# Patient Record
Sex: Male | Born: 1970 | Race: Black or African American | Hispanic: No | Marital: Single | Smoking: Current every day smoker
Health system: Southern US, Community
[De-identification: ages and names within clinical notes are randomized; demographics above are authoritative.]

## PROBLEM LIST (undated history)

## (undated) DIAGNOSIS — M1990 Unspecified osteoarthritis, unspecified site: Secondary | ICD-10-CM

## (undated) DIAGNOSIS — I10 Essential (primary) hypertension: Secondary | ICD-10-CM

## (undated) HISTORY — PX: ABDOMINAL SURGERY: SHX537

---

## 2006-12-16 ENCOUNTER — Emergency Department: Payer: Self-pay | Admitting: Emergency Medicine

## 2007-02-17 ENCOUNTER — Emergency Department: Payer: Self-pay | Admitting: Emergency Medicine

## 2007-08-27 ENCOUNTER — Emergency Department: Payer: Self-pay | Admitting: Emergency Medicine

## 2010-03-28 ENCOUNTER — Emergency Department: Payer: Self-pay | Admitting: Emergency Medicine

## 2010-05-14 ENCOUNTER — Emergency Department: Payer: Self-pay | Admitting: Emergency Medicine

## 2012-10-28 LAB — COMPREHENSIVE METABOLIC PANEL
Albumin: 3.1 g/dL — ABNORMAL LOW (ref 3.4–5.0)
Alkaline Phosphatase: 80 U/L (ref 50–136)
Anion Gap: 5 — ABNORMAL LOW (ref 7–16)
Calcium, Total: 8.5 mg/dL (ref 8.5–10.1)
Co2: 29 mmol/L (ref 21–32)
EGFR (Non-African Amer.): >60
Osmolality: 281 (ref 275–301)
SGOT(AST): 22 U/L (ref 15–37)
SGPT (ALT): 37 U/L (ref 12–78)
Sodium: 140 mmol/L (ref 136–145)
Total Protein: 7.9 g/dL (ref 6.4–8.2)

## 2012-10-28 LAB — LIPASE, BLOOD: Lipase: 142 U/L (ref 73–393)

## 2012-10-28 LAB — CBC WITH DIFFERENTIAL/PLATELET
Basophil #: 0.2 10*3/uL — ABNORMAL HIGH (ref 0.0–0.1)
Basophil %: 1.2 %
Eosinophil %: 1.2 %
MCH: 29.9 pg (ref 26.0–34.0)
MCHC: 33 g/dL (ref 32.0–36.0)
MCV: 91 fL (ref 80–100)
Neutrophil %: 74 %
RDW: 15.3 % — ABNORMAL HIGH (ref 11.5–14.5)
WBC: 15 10*3/uL — ABNORMAL HIGH (ref 3.8–10.6)

## 2012-10-29 ENCOUNTER — Inpatient Hospital Stay: Payer: Self-pay | Admitting: Specialist

## 2012-10-29 LAB — BASIC METABOLIC PANEL
Anion Gap: 4 — ABNORMAL LOW (ref 7–16)
BUN: 11 mg/dL (ref 7–18)
Calcium, Total: 8.5 mg/dL (ref 8.5–10.1)
Chloride: 105 mmol/L (ref 98–107)
Co2: 28 mmol/L (ref 21–32)
EGFR (African American): >60
Glucose: 91 mg/dL (ref 65–99)
Potassium: 4.1 mmol/L (ref 3.5–5.1)
Sodium: 137 mmol/L (ref 136–145)

## 2012-10-29 LAB — CBC WITH DIFFERENTIAL/PLATELET
Basophil #: 0.1 10*3/uL (ref 0.0–0.1)
Basophil %: 0.8 %
Eosinophil #: 0.2 10*3/uL (ref 0.0–0.7)
Eosinophil %: 1.5 %
HCT: 41 % (ref 40.0–52.0)
HGB: 13.8 g/dL (ref 13.0–18.0)
Lymphocyte #: 1.7 10*3/uL (ref 1.0–3.6)
Lymphocyte %: 12.3 %
MCH: 30.8 pg (ref 26.0–34.0)
MCHC: 33.7 g/dL (ref 32.0–36.0)
Neutrophil #: 9.9 10*3/uL — ABNORMAL HIGH (ref 1.4–6.5)
Platelet: 288 10*3/uL (ref 150–440)
RBC: 4.48 10*6/uL (ref 4.40–5.90)
RDW: 15 % — ABNORMAL HIGH (ref 11.5–14.5)

## 2012-10-30 LAB — VANCOMYCIN, TROUGH: Vancomycin, Trough: 14 ug/mL (ref 10–20)

## 2012-11-03 LAB — CULTURE, BLOOD (SINGLE)

## 2012-11-05 LAB — WOUND CULTURE

## 2014-12-09 NOTE — Consult Note (Signed)
Brief Consult Note: Diagnosis: rt abd wall abscess.   Patient was seen by consultant.   Consult note dictated.   Recommend to proceed with surgery or procedure.   Orders entered.   Comments: spont drainage butr still very indurated and tender rec I&D in OR risks and options in detail pt not made NPO, will  schedule in OR in am.  Electronic Signatures: Lattie Hawooper, Keagan Anthis E (MD)  (Signed 12-Mar-14 20:52)  Authored: Brief Consult Note   Last Updated: 12-Mar-14 20:52 by Lattie Hawooper, Labria Wos E (MD)

## 2014-12-09 NOTE — Op Note (Signed)
PATIENT NAME:  Mark Mcintosh, Mark Mcintosh MR#:  [id number] DATE OF BIRTH:  July 19, 1971  DATE OF PROCEDURE:  10/29/2012  PREOPERATIVE DIAGNOSIS: Right lower quadrant abdominal wall abscess.   PROCEDURE PERFORMED: Incision and drainage of abdominal wall abscess.   SURGEON: Natale LayMark Bird, M.D.   ASSISTANT: None.   ANESTHESIA: Local with general.   FINDINGS: Pus.   SPECIMENS: Pus.   DRAINS: Penrose x 2.   ESTIMATED BLOOD LOSS: Minimal.   DESCRIPTION OF PROCEDURE: With the patient in the supine position, general oral endotracheal anesthesia was induced. His abdomen was widely prepped and draped with ChloraPrep solution. Timeout was observed.   There was a punctum for purulent drainage from the inferior and medial aspect of approximately 15 x 15 cm area of induration in the right lower quadrant abdominal wall pannus. This punctum was used as a starting point for drainage of the abscess. A cruciate incision was fashioned overlying the site. A small amount of purulent drainage was encountered. More superiorly and laterally on the abdominal wall a second cruciate incision was fashioned with extrusion of a large amount of foul-smelling pus, aliquot of which first sent for micro bacteriological analysis.   Loculations were disrupted of a large abdominal wall abscess cavity with finger fracture technique. The abscess was then connected to the lowermost and medial incision site.  It then tracked also inferiorly and laterally. A third counter incision was fashioned in the usual cruciate manner. The wound was then irrigated and debrided several times with gauze dissection. Penrose drains were then inserted and secured at the skin site with nylon suture. Sterile occlusive dressing was then placed and the patient was then subsequently extubated and taken to the recovery room in stable and satisfactory condition by anesthesia services.     ____________________________ Redge GainerMark A. Egbert GaribaldiBird, MD mab:ct D: 10/30/2012 12:19:57  ET T: 10/31/2012 07:33:27 ET JOB#: [id number]  cc: Loraine LericheMark A. Egbert GaribaldiBird, MD, <Dictator> Raynald KempMARK A BIRD MD ELECTRONICALLY SIGNED 11/03/2012 19:35

## 2014-12-09 NOTE — Discharge Summary (Signed)
PATIENT NAME:  Mark Mcintosh, Mark Mcintosh MR#:  [id number] DATE OF BIRTH:  09/24/1970  For a detailed note, please see the history and physical done on admission by Dr. Mordecai MaesSanchez.   DIAGNOSES AT DISCHARGE: As follows: Abdominal wall cellulitis/abscess, status post incision and drainage, a history of tobacco abuse, stage I hypertension.   DISCHARGE DIET: The patient is being discharged on a regular diet.   ACTIVITY: As tolerated.   FOLLOWUP: With Dr. Natale LayMark Bird in the next 1 to 2 weeks.   DISCHARGE MEDICATIONS: Tylenol with hydrocodone 5/325, 1 tablet q.4 h. as needed, Bactrim double-strength 1 tablet b.i.d. x 10 days.   PERTINENT STUDIES DONE DURING THE HOSPITAL COURSE: A CT scan of the abdomen and pelvis done with contrast on admission showing multiple lobular fluid collections in the subcutaneous fat along the anterior right hemipelvis, concerning for multiple small abscesses.   HOSPITAL COURSE: This is a 44 year old male with no significant past medical history who presented to the hospital with abdominal wall redness and tenderness, and drainage. I diagnosed with a suspected cellulitis with abscess formation, given the CT scan findings.   1.  Abdominal wall cellulitis/abscess: This was noted on the CT scan done on admission. The patient initially was started on broad-spectrum IV antibiotics, including vancomycin and Zosyn. The patient was seen by Surgery. Underwent a surgical incision and drainage. The wound cultures from the surgery are growing gram-positive cocci in pairs and chains and a few gram- negative rods. The sensitivities and identification are still pending, although patient with IV vancomycin has clinically significantly improved. He is being discharged on p.o. Bactrim with close followup with Surgery as an outpatient. The patient does have a Penrose drain presently in his mid-abdomen, but this is to be removed by Surgery as an outpatient.  2.  Tobacco abuse: The patient was maintained on a  nicotine patch and he was strongly advised to quit smoking.  3.  Hypertension: The patient had some labile blood pressures as high as systolics in the 150s, diastolics in the 90s. He does not have a previous history of hypertension. I did not put him on any medications presently. I would prefer that he get himself a primary care physician and get this re-evaluated as an outpatient.   The patient is a FULL CODE.   Patient time spent is 35 minutes.     ____________________________ Rolly PancakeVivek J. Cherlynn KaiserSainani, MD vjs:dm D: 10/31/2012 14:13:00 ET T: 11/01/2012 10:03:12 ET JOB#: [id number]  cc: Loraine LericheMark A. Egbert GaribaldiBird, MD Rolly PancakeVivek J. Cherlynn KaiserSainani, MD, <Dictator>  Houston SirenVIVEK J SAINANI MD ELECTRONICALLY SIGNED 11/02/2012 8:18

## 2014-12-09 NOTE — Consult Note (Signed)
PATIENT NAME:  Mark Mcintosh, Mark Mcintosh MR#:  [id number] DATE OF BIRTH:  10/07/1970  DATE OF CONSULTATION:  10/28/2012  REFERRING PHYSICIAN:   CONSULTING PHYSICIAN:  Adah Salvageichard E. Excell Seltzerooper, MD  CHIEF COMPLAINT:  Right abdominal wall abscess.   HISTORY OF PRESENT ILLNESS:  This is a 44 year old black male patient who was in his previous healthy state until Saturday at which time he noted pain and swelling in his right abdominal wall. He has noticed some spontaneous drainage and his significant other may have pushed on this and drained some material as well, but no further drainage. He has also had some low-grade fevers. He has never had an episode like this before.   Surgery service was asked to see this patient for possible incision and drainage. He has not been made n.p.o. and ate dinner tonight.   PAST MEDICAL HISTORY:  None.   PAST SURGICAL HISTORY:  None.   ALLERGIES:  None.   MEDICATIONS:  None.   FAMILY HISTORY:  Noncontributory.   SOCIAL HISTORY:  The patient is employed. He works with chemicals. He smokes tobacco products.   REVIEW OF SYSTEMS:  A 10 system review is performed and negative with the exception of that mentioned in the HPI.   PHYSICAL EXAMINATION: GENERAL: Morbidly obese black male patient, BMI of 41, 72 inches tall, 301 pounds.  HEENT: No scleral icterus.  NECK: No palpable neck nodes.  CHEST: Clear to auscultation.  CARDIAC: Regular rate and rhythm.  ABDOMEN: Soft. There is an erythematous area outlined by a marking pen in the right lower quadrant at the groin crease with no expressible drainage. There is no fluctuance, but he is markedly tender suggesting abscess.  EXTREMITIES: Without edema. Calves are nontender.  NEUROLOGIC: Grossly intact.  INTEGUMENT: No jaundice.   DIAGNOSTIC DATA:  White blood cell count is 15 and H and H of 14 and 44. Electrolytes are within normal limits. Serum potassium is 3.4 and albumin is 3.1 MRSA screen is negative. CT scan has been  personally reviewed. Subcutaneous tissue filled with fluid collections in the right lower quadrant.   ASSESSMENT AND PLAN:  This is a patient with cellulitis and probable abscess in the right lower quadrant. I have recommended incision and drainage in the operating room. Unfortunately, the patient was not made n.p.o. I discussed with him and his significant other the rationale for offering surgery, the risks of bleeding, infection, recurrence, open wound, drain placement, packing and recurrent abscess. This was all reviewed for him. Because of his n.p.o. status, he will be made n.p.o. after midnight and scheduled for tomorrow continuing on intravenous antibiotics. Fluid replacement will be instituted as well.   ____________________________ Adah Salvageichard E. Excell Seltzerooper, MD rec:si D: 10/28/2012 21:10:32 ET T: 10/29/2012 00:06:08 ET JOB#: [id number]  cc: Adah Salvageichard E. Excell Seltzerooper, MD, <Dictator> Lattie HawICHARD E COOPER MD ELECTRONICALLY SIGNED 10/29/2012 3:16

## 2014-12-09 NOTE — H&P (Signed)
PATIENT NAME:  Mark Mcintosh, Mark Mcintosh MR#:  [id number] DATE OF BIRTH:  September 15, 1970  DATE OF ADMISSION:  10/28/2012  REFERRING PHYSICIAN: Sheryl L. Mindi Junker, MD  PRIMARY CARE PHYSICIAN: None.   CHIEF COMPLAINT: Abdominal wall pain with an abscess/boil.  HISTORY OF PRESENT ILLNESS: The patient is a 44 year old gentleman with history of being overall healthy, comes to the ER with an abscess on at the groin that started Saturday, more like a boil, starting to drain a lot of pus and leaking. Sunday, he started having effusion of that abscess into the lower part of the abdominal wall and the patient started to have a significant amount of pain. He has a history of boils in the past, but he has never been diagnosed with MRSA. He denies any chronic medical conditions, but he does not see a doctor regularly.  The patient states that the pain has been very severe. He has gotten at least 3 doses of morphine here, and his pain has improved but still not significantly to be able to feel that he can go home. I was asked to admit the patient due to systemic findings of the pain including increase of white blood cells, fever, and the patient being very tachycardic with a pulse of 120. He looks dehydrated and he is getting IV fluids as well. His temperature here at the ER was 98.1, but the patient states he has been having fevers at home.   REVIEW OF SYSTEMS:    CONSTITUTIONAL: Positive fever. Positive abdominal pain. No weight gain, weight loss recently.  EYES: No blurry vision, double vision or changes in eyesight.  ENT: No hearing loss. No epistaxis. No sinus pain.  RESPIRATORY: No shortness of breath, cough, wheezing or hemoptysis.  CARDIOVASCULAR: No chest pain, orthopnea, edema or palpitations.  GASTROINTESTINAL: No nausea, vomiting or abdominal pain, other than the abdominal wall pain. No significant GERD or jaundice.  GENITOURINARY: No dysuria, hematuria or changes of frequency or incontinence. No prostatitis.   ENDOCRINE: No polyuria, polydipsia or polyphagia. No cold or heat intolerance. The patient is thirsty right now because he has not been drinking much and he looks dehydrated anyway.  HEMATOLOGIC AND LYMPHATIC: No anemia, easy bruising, bleeding or swollen glands.  SKIN: No rashes or lesions.  MUSCULOSKELETAL: No significant pain in the back, shoulder or knee. No gout.  NEUROLOGIC: No numbness or tingling. No CVAs or TIAs.  PSYCHIATRIC: No significant insomnia, depression or other problems.   PAST MEDICAL HISTORY: Obesity. The patient overall states he is healthy. Positive tobacco abuse.   ALLERGIES: No known drug allergies.   SURGICAL HISTORY: Positive for tonsillectomy.   SOCIAL HISTORY: The patient smokes 1/2 pack a day. He has been smoking since his teenage years, probably over 20 years. He states that he would like to quit. I gave him counseling for smoking cessation for about 5 minutes and the patient states that he just does not have the economic power to buy expensive drugs and other things to help him quit. I gave him other solutions as far as physical activity and other easy fixes for quitting smoking. He states that he drinks probably a couple of drinks over the weekend. He does not use drugs. He lives with his girlfriend and he works as a Financial controller in a company.   MEDICATIONS: No medications.   FAMILY HISTORY: Positive for coronary artery disease in grandmother. Negative for cancer. Positive for hypertension in mother and grandmother.   PHYSICAL EXAMINATION:  VITAL SIGNS: Blood pressure  of 156/85 at admission, right now 132/79, pulse of 120, right now 93, temperature 98.1, oxygen saturation 99% on room air. GENERAL: Alert, oriented x 3. No acute distress. No respiratory distress. Hemodynamically stable.  HEENT: Pupils are equal and reactive. Extraocular movements are intact. Mucosae are very dry. Anicteric sclerae. Pink conjunctivae. No oral lesions.  NECK:  Supple. No JVD. No thyromegaly. No adenopathy. No carotid bruits.  CARDIOVASCULAR: Regular rate and rhythm. No murmurs, rubs or gallops are appreciated. No displacement of PMI.  LUNGS: Clear without any wheezing or crepitus. No use of accessory muscles.  ABDOMEN: Soft, nontender, nondistended in general, but he has an abdominal wall cellulitic process with induration at the level of the right lower quadrant measuring about 7 to 8 cm surrounding. He has also a growing boil that is starting to get dry. There is no fluctuation, but it feels really indurated too. There is no secretion of pus at this moment. No inguinal lymphadenopathy, and there is no rebound tenderness.  GENITAL: Negative for external lesions.  EXTREMITIES: No edema, no cyanosis, no clubbing. Pulses +2.  LYMPHATICS: Negative for lymphadenopathy in the neck, supraclavicular area or groin.  MUSCULOSKELETAL: No significant joint effusions or joint erythema.  SKIN: Without any rashes or petechiae, other than the one described on the abdomen.  PSYCHIATRIC: Mood is normal without any signs of depression or anxiety.  NEUROLOGIC: No numbness, tingling. Cranial nerves II through XII intact.   LABORATORY, DIAGNOSTIC AND RADIOLOGIC DATA: White count is 15,000. Total protein is 7.9, albumin 2.1. Glucose is 130. Potassium is 3.4.   ASSESSMENT AND PLAN: A 44 year old gentleman admitted with a boil and abdominal wall abscess: The patient is overall healthy other than obesity and tobacco abuse, and we are admitting him due to intractable pain and cellulitic process with systemic manifestations.  1.  The process does not quite look like an abcess mostly a boil with cellulitic process, for what he is going to get intravenous antibiotics. He has been started on vancomycin, for what I am going to continue that. He has a history of boils in the past. He has never been told he has methicillin-resistant Staphylococcus aureus. We are going to do  methicillin-resistant Staphylococcus aureus screening with a swab. If the patient has any secretions, we are going to culture it as well. I am going to consult surgery to see if the patient needs to be incised and drained. At this moment, I think that he is close to that. Maybe the antibiotics will help, but I want to have surgery on the case since the patient might be discharged since he is on observation status.  2.  Hypertension: The patient came with elevation of blood pressure. With his obesity and tobacco abuse, it is very likely that the patient may have some hypertension, but that could be also related with pain. We are going to observe.  3.  The other issue is hyperglycemia, and we are going to observe and get a hemoglobin A1c. 4.  The patient is a full code.   TIME SPENT: I spent about 45 minutes with admission. I spent 5 minutes giving tobacco counseling.    ____________________________ Felipa Furnaceoberto Sanchez Gutierrez, MD rsg:jm D: 10/28/2012 15:12:26 ET T: 10/28/2012 15:40:43 ET JOB#: [id number]  cc: Felipa Furnaceoberto Sanchez Gutierrez, MD, <Dictator> ROBERTO Juanda ChanceSANCHEZ GUTIERRE MD ELECTRONICALLY SIGNED 11/12/2012 21:42

## 2015-03-21 ENCOUNTER — Emergency Department: Payer: BLUE CROSS/BLUE SHIELD

## 2015-03-21 ENCOUNTER — Encounter: Payer: Self-pay | Admitting: Emergency Medicine

## 2015-03-21 ENCOUNTER — Emergency Department
Admission: EM | Admit: 2015-03-21 | Discharge: 2015-03-21 | Disposition: A | Discharge status: Home / Self Care | Payer: BLUE CROSS/BLUE SHIELD | Attending: Emergency Medicine | Admitting: Emergency Medicine

## 2015-03-21 DIAGNOSIS — X58XXXA Exposure to other specified factors, initial encounter: Secondary | ICD-10-CM | POA: Diagnosis not present

## 2015-03-21 DIAGNOSIS — Z720 Tobacco use: Secondary | ICD-10-CM | POA: Diagnosis not present

## 2015-03-21 DIAGNOSIS — Y998 Other external cause status: Secondary | ICD-10-CM | POA: Diagnosis not present

## 2015-03-21 DIAGNOSIS — Y92009 Unspecified place in unspecified non-institutional (private) residence as the place of occurrence of the external cause: Secondary | ICD-10-CM | POA: Insufficient documentation

## 2015-03-21 DIAGNOSIS — Y9389 Activity, other specified: Secondary | ICD-10-CM | POA: Diagnosis not present

## 2015-03-21 DIAGNOSIS — R05 Cough: Secondary | ICD-10-CM | POA: Insufficient documentation

## 2015-03-21 DIAGNOSIS — S299XXA Unspecified injury of thorax, initial encounter: Secondary | ICD-10-CM | POA: Insufficient documentation

## 2015-03-21 DIAGNOSIS — R0789 Other chest pain: Secondary | ICD-10-CM

## 2015-03-21 MED ORDER — OXYCODONE-ACETAMINOPHEN 5-325 MG PO TABS
ORAL_TABLET | ORAL | Status: AC
  Administered 2015-03-21: 1 via ORAL
  Filled 2015-03-21: qty 1

## 2015-03-21 MED ORDER — NAPROXEN 250 MG PO TABS: 250.0000 mg | ORAL_TABLET | Freq: Two times a day (BID) | ORAL | Status: DC

## 2015-03-21 MED ORDER — OXYCODONE-ACETAMINOPHEN 5-325 MG PO TABS: 1.0000 | ORAL_TABLET | Freq: Four times a day (QID) | ORAL | Status: DC | PRN

## 2015-03-21 MED ORDER — OXYCODONE-ACETAMINOPHEN 5-325 MG PO TABS
1.0000 | ORAL_TABLET | Freq: Once | ORAL | Status: AC
  Administered 2015-03-21: 1 via ORAL

## 2015-03-21 MED ORDER — AZITHROMYCIN 250 MG PO TABS: ORAL_TABLET | ORAL | Status: DC

## 2015-03-21 NOTE — ED Notes (Signed)
Patient ambulatory to triage with steady gait, without difficulty or distress noted; pt reports Saturday while at friend's house began coughing hard and felt pop in left lower ribcage

## 2015-03-21 NOTE — ED Notes (Signed)
Teaching performed on incentive spirometer with teach back

## 2015-03-21 NOTE — ED Notes (Signed)
Patient transported to X-ray 

## 2015-03-21 NOTE — ED Provider Notes (Signed)
[facility] Emergency Department Provider Note  ____________________________________________  Time seen: 7:10 AM  I have reviewed the triage vital signs and the nursing notes.   HISTORY  Chief Complaint Rib Injury    HPI Mark Mcintosh is a 44 y.o. male who complains of sudden onset of sharp pain in the left lower chest laterally after forcefully coughing 3 days ago. Before that he was feeling totally fine and did not have any chest pain or shortness of breath. Afterward it is painful to breathe. He also notes that he's having some productive cough that is at times blood-tinged. No history of DVT or PE. No medical history and takes no medicines.No recent travel trauma hospitalizations or surgeries. He also complains of some night sweats.     History reviewed. No pertinent past medical history.  There are no active problems to display for this patient.   Past Surgical History  Procedure Laterality Date  . Abdominal surgery      Current Outpatient Rx  Name  Route  Sig  Dispense  Refill  . azithromycin (ZITHROMAX Z-PAK) 250 MG tablet      Take 2 tablets (500 mg) on  Day 1,  followed by 1 tablet (250 mg) once daily on Days 2 through 5.   6 each   0   . naproxen (NAPROSYN) 250 MG tablet   Oral   Take 1 tablet (250 mg total) by mouth 2 (two) times daily with a meal.   40 tablet   0   . oxyCODONE-acetaminophen (ROXICET) 5-325 MG per tablet   Oral   Take 1 tablet by mouth every 6 (six) hours as needed for severe pain.   12 tablet   0     Allergies Review of patient's allergies indicates no known allergies.  No family history on file.  Social History History  Substance Use Topics  . Smoking status: Current Every Day Smoker -- 1.00 packs/day    Types: Cigarettes  . Smokeless tobacco: Not on file  . Alcohol Use: Yes    Review of Systems  Constitutional: No fever or chills. No weight changes Eyes:No blurry vision or double vision.   ENT: No sore throat. Cardiovascular: chest pain with coughing. Respiratory: Positive cough productive of mucus. Gastrointestinal: Negative for abdominal pain, vomiting and diarrhea.  No BRBPR or melena. Genitourinary: Negative for dysuria, urinary retention, bloody urine, or difficulty urinating. Musculoskeletal: Negative for back pain. No joint swelling or pain. Skin: Negative for rash. Neurological: Negative for headaches, focal weakness or numbness. Psychiatric:No anxiety or depression.   Endocrine:No hot/cold intolerance, changes in energy, or sleep difficulty.  10-point ROS otherwise negative.  ____________________________________________   PHYSICAL EXAM:  VITAL SIGNS: ED Triage Vitals  Enc Vitals Group     BP 03/21/15 0628 173/130 mmHg     Pulse Rate 03/21/15 0628 90     Resp 03/21/15 0628 20     Temp 03/21/15 0628 98 F (36.7 C)     Temp Source 03/21/15 0628 Oral     SpO2 03/21/15 0628 95 %     Weight 03/21/15 0628 260 lb (117.935 kg)     Height 03/21/15 0628 5\' 11"  (1.803 m)     Head Cir --      Peak Flow --      Pain Score 03/21/15 0628 7     Pain Loc --      Pain Edu? --      Excl. in GC? --  Constitutional: Alert and oriented. Well appearing and in no distress. Eyes: No scleral icterus. No conjunctival pallor. PERRL. EOMI ENT   Head: Normocephalic and atraumatic.   Nose: No congestion/rhinnorhea. No septal hematoma   Mouth/Throat: MMM, no pharyngeal erythema. No peritonsillar mass. No uvula shift.   Neck: No stridor. No SubQ emphysema. No meningismus. Hematological/Lymphatic/Immunilogical: No cervical lymphadenopathy. Cardiovascular: RRR. Normal and symmetric distal pulses are present in all extremities. No murmurs, rubs, or gallops. Respiratory: Normal respiratory effort without tachypnea nor retractions. Breath sounds are clear and equal bilaterally. No wheezes/rales/rhonchi. Chest wall markedly tender over the sternum and over the left  inferior ribs particularly laterally. There is no instability or crepitus. Gastrointestinal: Soft and nontender. No distention. There is no CVA tenderness.  No rebound, rigidity, or guarding. Genitourinary: deferred Musculoskeletal: Nontender with normal range of motion in all extremities. No joint effusions.  No lower extremity tenderness.  No edema. Neurologic:   Normal speech and language.  CN 2-10 normal. Motor grossly intact. No pronator drift.  Normal gait. No gross focal neurologic deficits are appreciated.  Skin:  Skin is warm, dry and intact. No rash noted.  No petechiae, purpura, or bullae. Psychiatric: Mood and affect are normal. Speech and behavior are normal. Patient exhibits appropriate insight and judgment.  ____________________________________________    LABS (pertinent positives/negatives) (all labs ordered are listed, but only abnormal results are displayed) Labs Reviewed - No data to display ____________________________________________   EKG    ____________________________________________    RADIOLOGY  X-ray Left rib series unremarkable  ____________________________________________   PROCEDURES  ____________________________________________   INITIAL IMPRESSION / ASSESSMENT AND PLAN / ED COURSE  Pertinent labs & imaging results that were available during my care of the patient were reviewed by me and considered in my medical decision making (see chart for details).  Patient presents with acute chest wall pain. Very low suspicion for PE ACS or aortic dissection or pericarditis. Blood-tinged sputum very likely to be bronchitic in nature and will start him on NSAIDs and Percocet as needed and incentive spirometer.  ____________________________________________   FINAL CLINICAL IMPRESSION(S) / ED DIAGNOSES  Final diagnoses:  Acute chest wall pain      Sharman Cheek, MD 03/21/15 [phone]

## 2015-03-21 NOTE — Discharge Instructions (Signed)
You were prescribed a medication that is potentially sedating. Do not drink alcohol, drive or participate in any other potentially dangerous activities while taking this medication as it may make you sleepy. Do not take this medication with any other sedating medications, either prescription or over-the-counter. If you were prescribed Percocet or Vicodin, do not take these with acetaminophen (Tylenol) as it is already contained within these medications. °  °Opioid pain medications (or "narcotics") can be habit forming.  Use it as little as possible to achieve adequate pain control.  Do not use or use it with extreme caution if you have a history of opiate abuse or dependence.  If you are on a pain contract with your primary care doctor or a pain specialist, be sure to let them know you were prescribed this medication today from the  [facility].  This medication is intended for your use only - do not give any to anyone else and keep it in a secure place where nobody else, especially children and pets, have access to it.  It will also cause or worsen constipation, so you may want to consider taking an over-the-counter stool softener while you are taking this medication. ° ° °Chest Wall Pain °Chest wall pain is pain in or around the bones and muscles of your chest. It may take up to 6 weeks to get better. It may take longer if you must stay physically active in your work and activities.  °CAUSES  °Chest wall pain may happen on its own. However, it may be caused by: °· A viral illness like the flu. °· Injury. °· Coughing. °· Exercise. °· Arthritis. °· Fibromyalgia. °· Shingles. °HOME CARE INSTRUCTIONS  °· Avoid overtiring physical activity. Try not to strain or perform activities that cause pain. This includes any activities using your chest or your abdominal and side muscles, especially if heavy weights are used. °· Put ice on the sore area. °¨ Put ice in a plastic bag. °¨ Place a towel  between your skin and the bag. °¨ Leave the ice on for 15-20 minutes per hour while awake for the first 2 days. °· Only take over-the-counter or prescription medicines for pain, discomfort, or fever as directed by your caregiver. °SEEK IMMEDIATE MEDICAL CARE IF:  °· Your pain increases, or you are very uncomfortable. °· You have a fever. °· Your chest pain becomes worse. °· You have new, unexplained symptoms. °· You have nausea or vomiting. °· You feel sweaty or lightheaded. °· You have a cough with phlegm (sputum), or you cough up blood. °MAKE SURE YOU:  °· Understand these instructions. °· Will watch your condition. °· Will get help right away if you are not doing well or get worse. °Document Released: 08/05/2005 Document Revised: 10/28/2011 Document Reviewed: 04/01/2011 °ExitCare® Patient Information ©2015 [employer]. This information is not intended to replace advice given to you by your health care provider. Make sure you discuss any questions you have with your health care provider. ° °

## 2017-07-21 ENCOUNTER — Other Ambulatory Visit: Payer: Self-pay | Admitting: Occupational Medicine

## 2017-07-21 ENCOUNTER — Ambulatory Visit: Payer: BLUE CROSS/BLUE SHIELD

## 2017-07-21 DIAGNOSIS — Z0000 Encounter for general adult medical examination without abnormal findings: Secondary | ICD-10-CM

## 2017-10-04 ENCOUNTER — Emergency Department
Admission: EM | Admit: 2017-10-04 | Discharge: 2017-10-04 | Disposition: A | Discharge status: Home / Self Care | Payer: Self-pay | Attending: Student in an Organized Health Care Education/Training Program | Admitting: Student in an Organized Health Care Education/Training Program

## 2017-10-04 ENCOUNTER — Other Ambulatory Visit: Payer: Self-pay

## 2017-10-04 ENCOUNTER — Emergency Department: Payer: Self-pay

## 2017-10-04 DIAGNOSIS — J111 Influenza due to unidentified influenza virus with other respiratory manifestations: Secondary | ICD-10-CM | POA: Insufficient documentation

## 2017-10-04 DIAGNOSIS — J101 Influenza due to other identified influenza virus with other respiratory manifestations: Secondary | ICD-10-CM

## 2017-10-04 DIAGNOSIS — F17210 Nicotine dependence, cigarettes, uncomplicated: Secondary | ICD-10-CM | POA: Insufficient documentation

## 2017-10-04 LAB — CBC
HCT: 45.2 % (ref 40.0–52.0)
Hemoglobin: 15 g/dL (ref 13.0–18.0)
MCH: 31 pg (ref 26.0–34.0)
MCHC: 33.2 g/dL (ref 32.0–36.0)
MCV: 93.4 fL (ref 80.0–100.0)
PLATELETS: 214 10*3/uL (ref 150–440)
RBC: 4.83 MIL/uL (ref 4.40–5.90)
RDW: 16.4 % — AB (ref 11.5–14.5)
WBC: 5.8 10*3/uL (ref 3.8–10.6)

## 2017-10-04 LAB — TROPONIN I: Troponin I: <0.03 ng/mL (ref <0.03)

## 2017-10-04 LAB — BASIC METABOLIC PANEL
Anion gap: 10 (ref 5–15)
BUN: 12 mg/dL (ref 6–20)
CALCIUM: 8.8 mg/dL — AB (ref 8.9–10.3)
CO2: 24 mmol/L (ref 22–32)
CREATININE: 1.15 mg/dL (ref 0.61–1.24)
Chloride: 104 mmol/L (ref 101–111)
GFR calc Af Amer: >60 mL/min (ref >60)
GFR calc non Af Amer: >60 mL/min (ref >60)
Glucose, Bld: 116 mg/dL — ABNORMAL HIGH (ref 65–99)
Potassium: 3.7 mmol/L (ref 3.5–5.1)
Sodium: 138 mmol/L (ref 135–145)

## 2017-10-04 LAB — INFLUENZA PANEL BY PCR (TYPE A & B)
Influenza A By PCR: POSITIVE — AB
Influenza B By PCR: NEGATIVE

## 2017-10-04 LAB — GROUP A STREP BY PCR: Group A Strep by PCR: NOT DETECTED

## 2017-10-04 MED ORDER — ACETAMINOPHEN 325 MG PO TABS
650.0000 mg | ORAL_TABLET | Freq: Once | ORAL | Status: AC | PRN
  Administered 2017-10-04: 650 mg via ORAL
  Filled 2017-10-04: qty 2

## 2017-10-04 MED ORDER — OSELTAMIVIR PHOSPHATE 75 MG PO CAPS: 75.0000 mg | ORAL_CAPSULE | Freq: Two times a day (BID) | ORAL | 0 refills | Status: AC

## 2017-10-04 NOTE — ED Notes (Signed)

## 2017-10-04 NOTE — ED Triage Notes (Signed)
Pt presents to ED for feeling bad since Thursday. States started with fevers. Has been taking nyquil at home. States he took something around 8am but unsure what it is. Pt states since Thursday L arm and L leg numbness. Pt able to move extremities. Alert, oriented. Sounds congested. C/o sore throat and congestion.

## 2017-10-04 NOTE — ED Notes (Signed)
Positive for flu. Other labs OK. Temp to 99.6

## 2017-10-04 NOTE — ED Provider Notes (Signed)
[facility] Emergency Department Provider Note  ____________________________________________  Time seen: Approximately 4:57 PM  I have reviewed the triage vital signs and the nursing notes.   HISTORY  Chief Complaint Fever and Numbness    HPI Mark Mcintosh is a 47 y.o. male presents to the emergency department with headache, rhinorrhea, congestion, nonproductive cough, emesis and diarrhea for the past 2 days.  Patient's wife has also had similar symptoms.  Patient has experienced diminished appetite.  He is tolerating Gatorade.  He denies chest pain, chest tightness and abdominal pain.  No alleviating measures have been attempted.   History reviewed. No pertinent past medical history.  There are no active problems to display for this patient.   Past Surgical History:  Procedure Laterality Date  . ABDOMINAL SURGERY      Prior to Admission medications   Medication Sig Start Date End Date Taking? Authorizing Provider  azithromycin (ZITHROMAX Z-PAK) 250 MG tablet Take 2 tablets (500 mg) on  Day 1,  followed by 1 tablet (250 mg) once daily on Days 2 through 5. 03/21/15   Sharman CheekStafford, Phillip, MD  naproxen (NAPROSYN) 250 MG tablet Take 1 tablet (250 mg total) by mouth 2 (two) times daily with a meal. 03/21/15   Sharman CheekStafford, Phillip, MD  oseltamivir (TAMIFLU) 75 MG capsule Take 1 capsule (75 mg total) by mouth 2 (two) times daily for 5 days. 10/04/17 10/09/17  Orvil FeilWoods, Gerry Blanchfield M, PA-C  oxyCODONE-acetaminophen (ROXICET) 5-325 MG per tablet Take 1 tablet by mouth every 6 (six) hours as needed for severe pain. 03/21/15   Sharman CheekStafford, Phillip, MD    Allergies Patient has no known allergies.  History reviewed. No pertinent family history.  Social History Social History   Tobacco Use  . Smoking status: Current Every Day Smoker    Packs/day: 1.00    Types: Cigarettes  Substance Use Topics  . Alcohol use: Yes  . Drug use: Not on file      Review of Systems   Constitutional: Patient has fever.  Eyes: No visual changes. No discharge ENT: Patient has congestion.  Cardiovascular: no chest pain. Respiratory: Patient has cough.  Gastrointestinal: No abdominal pain.  No nausea, no vomiting. Patient had diarrhea.  Genitourinary: Negative for dysuria. No hematuria Musculoskeletal: Patient has myalgias.  Skin: Negative for rash, abrasions, lacerations, ecchymosis. Neurological: Patient has headache, no focal weakness or numbness.    ____________________________________________   PHYSICAL EXAM:  VITAL SIGNS: ED Triage Vitals  Enc Vitals Group     BP 10/04/17 1350 (!) 165/88     Pulse Rate 10/04/17 1350 (!) 110     Resp 10/04/17 1350 20     Temp 10/04/17 1350 (!) 101 F (38.3 C)     Temp Source 10/04/17 1537 Oral     SpO2 10/04/17 1350 93 %     Weight 10/04/17 1351 256 lb (116.1 kg)     Height 10/04/17 1351 5\' 11"  (1.803 m)     Head Circumference --      Peak Flow --      Pain Score 10/04/17 1351 7     Pain Loc --      Pain Edu? --      Excl. in GC? --     Constitutional: Alert and oriented. Patient is lying supine. Eyes: Conjunctivae are normal. PERRL. EOMI. Head: Atraumatic. ENT:      Ears: Tympanic membranes are mildly injected with mild effusion bilaterally.       Nose: No congestion/rhinnorhea.  Mouth/Throat: Mucous membranes are moist. Posterior pharynx is mildly erythematous.  Hematological/Lymphatic/Immunilogical: No cervical lymphadenopathy.  Cardiovascular: Normal rate, regular rhythm. Normal S1 and S2.  Good peripheral circulation. Respiratory: Normal respiratory effort without tachypnea or retractions. Lungs CTAB. Good air entry to the bases with no decreased or absent breath sounds. Gastrointestinal: Bowel sounds 4 quadrants. Soft and nontender to palpation. No guarding or rigidity. No palpable masses. No distention. No CVA tenderness. Musculoskeletal: Full range of motion to all extremities. No gross deformities  appreciated. Neurologic:  Normal speech and language. No gross focal neurologic deficits are appreciated.  Skin:  Skin is warm, dry and intact. No rash noted. Psychiatric: Mood and affect are normal. Speech and behavior are normal. Patient exhibits appropriate insight and judgement.   ____________________________________________   LABS (all labs ordered are listed, but only abnormal results are displayed)  Labs Reviewed  BASIC METABOLIC PANEL - Abnormal; Notable for the following components:      Result Value   Glucose, Bld 116 (*)    Calcium 8.8 (*)    All other components within normal limits  CBC - Abnormal; Notable for the following components:   RDW 16.4 (*)    All other components within normal limits  INFLUENZA PANEL BY PCR (TYPE A & B) - Abnormal; Notable for the following components:   Influenza A By PCR POSITIVE (*)    All other components within normal limits  GROUP A STREP BY PCR  TROPONIN I   ____________________________________________  EKG   ____________________________________________  RADIOLOGY Geraldo Pitter, personally viewed and evaluated these images (plain radiographs) as part of my medical decision making, as well as reviewing the written report by the radiologist.  Dg Chest 2 View  Result Date: 10/04/2017 CLINICAL DATA:  Fever and chills. EXAM: CHEST  2 VIEW COMPARISON:  July 21, 2017 FINDINGS: The heart size and mediastinal contours are within normal limits. Both lungs are clear. The visualized skeletal structures are unremarkable. IMPRESSION: No active cardiopulmonary disease. Electronically Signed   By: Gerome Sam III M.D   On: 10/04/2017 14:26    ____________________________________________    PROCEDURES  Procedure(s) performed:    Procedures    Medications  acetaminophen (TYLENOL) tablet 650 mg (650 mg Oral Given 10/04/17 1401)     ____________________________________________   INITIAL IMPRESSION / ASSESSMENT AND  PLAN / ED COURSE  Pertinent labs & imaging results that were available during my care of the patient were reviewed by me and considered in my medical decision making (see chart for details).  Review of the [place] CSRS was performed in accordance of the [facility] prior to dispensing any controlled drugs.     Assessment and Plan:  Influenza A  Patient presents to the emergency department with headache, rhinorrhea, congestion, nonproductive cough, emesis and diarrhea for the past 2 days.  Differential diagnosis included community-acquired pneumonia, influenza A and unspecified viral URI.  Patient tested positive for influenza A in the emergency department.  He was discharged with Tamiflu and supportive measures were encouraged.  Patient was advised to follow-up with primary care as needed.  All patient questions were answered.     ____________________________________________  FINAL CLINICAL IMPRESSION(S) / ED DIAGNOSES  Final diagnoses:  Influenza A      NEW MEDICATIONS STARTED DURING THIS VISIT:  ED Discharge Orders        Ordered    oseltamivir (TAMIFLU) 75 MG capsule  2 times daily     10/04/17 1623  This chart was dictated using voice recognition software/Dragon. Despite best efforts to proofread, errors can occur which can change the meaning. Any change was purely unintentional.    Orvil Feil, PA-C 10/04/17 1701    Willy Eddy, MD 10/04/17 [phone]

## 2017-11-15 ENCOUNTER — Emergency Department
Admission: EM | Admit: 2017-11-15 | Discharge: 2017-11-15 | Disposition: A | Discharge status: Home / Self Care | Payer: Self-pay | Attending: Emergency Medicine | Admitting: Emergency Medicine

## 2017-11-15 ENCOUNTER — Other Ambulatory Visit: Payer: Self-pay

## 2017-11-15 ENCOUNTER — Encounter: Payer: Self-pay | Admitting: Emergency Medicine

## 2017-11-15 DIAGNOSIS — J069 Acute upper respiratory infection, unspecified: Secondary | ICD-10-CM | POA: Insufficient documentation

## 2017-11-15 DIAGNOSIS — F17210 Nicotine dependence, cigarettes, uncomplicated: Secondary | ICD-10-CM | POA: Insufficient documentation

## 2017-11-15 DIAGNOSIS — Z79899 Other long term (current) drug therapy: Secondary | ICD-10-CM | POA: Insufficient documentation

## 2017-11-15 MED ORDER — AZITHROMYCIN 250 MG PO TABS: ORAL_TABLET | ORAL | 0 refills | Status: DC

## 2017-11-15 MED ORDER — IPRATROPIUM-ALBUTEROL 0.5-2.5 (3) MG/3ML IN SOLN
3.0000 mL | Freq: Once | RESPIRATORY_TRACT | Status: AC
  Administered 2017-11-15: 3 mL via RESPIRATORY_TRACT
  Filled 2017-11-15: qty 3

## 2017-11-15 MED ORDER — ALBUTEROL SULFATE HFA 108 (90 BASE) MCG/ACT IN AERS: 2.0000 | INHALATION_SPRAY | Freq: Four times a day (QID) | RESPIRATORY_TRACT | 0 refills | Status: DC | PRN

## 2017-11-15 MED ORDER — PREDNISONE 10 MG PO TABS: ORAL_TABLET | ORAL | 0 refills | Status: DC

## 2017-11-15 NOTE — ED Triage Notes (Signed)
C/O productive cough, symptoms worsened on Wednesday.  Also c/o laryngitis.  States everyone at work has had similar sympotms.

## 2017-11-15 NOTE — ED Provider Notes (Signed)
[facility] Emergency Department Provider Note  ____________________________________________  Time seen: Approximately 2:11 PM  I have reviewed the triage vital signs and the nursing notes.   HISTORY  Chief Complaint Cough    HPI Mark Mcintosh is a 47 y.o. male that presents emergency department for evaluation of bilateral ear pain, sore throat, productive cough with white sputum for 6 days.  Cough was initially dry but he is now coughing up white phlegm.  Patient smokes daily.   He has not checked his temperature but has had chills at night.  He has several sick contacts at work.  No nasal congestion, nausea, vomiting, abdominal pain.   History reviewed. No pertinent past medical history.  There are no active problems to display for this patient.   Past Surgical History:  Procedure Laterality Date  . ABDOMINAL SURGERY      Prior to Admission medications   Medication Sig Start Date End Date Taking? Authorizing Provider  albuterol (PROVENTIL HFA;VENTOLIN HFA) 108 (90 Base) MCG/ACT inhaler Inhale 2 puffs into the lungs every 6 (six) hours as needed for wheezing or shortness of breath. 11/15/17   Enid DerryWagner, Kellsie Grindle, PA-C  azithromycin (ZITHROMAX Z-PAK) 250 MG tablet Take 2 tablets (500 mg) on  Day 1,  followed by 1 tablet (250 mg) once daily on Days 2 through 5. 11/15/17   Enid DerryWagner, Jyll Tomaro, PA-C  naproxen (NAPROSYN) 250 MG tablet Take 1 tablet (250 mg total) by mouth 2 (two) times daily with a meal. 03/21/15   Sharman CheekStafford, Phillip, MD  oxyCODONE-acetaminophen (ROXICET) 5-325 MG per tablet Take 1 tablet by mouth every 6 (six) hours as needed for severe pain. 03/21/15   Sharman CheekStafford, Phillip, MD  predniSONE (DELTASONE) 10 MG tablet Take 6 tablets on day 1, take 5 tablets on day 2, take 4 tablets on day 3, take 3 tablets on day 4, take 2 tablets on day 5, take 1 tablet on day 6 11/15/17   Enid DerryWagner, Lyrika Souders, PA-C    Allergies Patient has no known allergies.  No family history on  file.  Social History Social History   Tobacco Use  . Smoking status: Current Every Day Smoker    Packs/day: 1.00    Types: Cigarettes  . Smokeless tobacco: Never Used  Substance Use Topics  . Alcohol use: Yes  . Drug use: Not on file     Review of Systems  Constitutional: Positive for chills ENT: Negative for congestion and rhinorrhea. Cardiovascular: No chest pain. Respiratory: Positive for cough. No SOB. Gastrointestinal: No abdominal pain.  No nausea, no vomiting.  No diarrhea.  No constipation. Musculoskeletal: Negative for musculoskeletal pain. Skin: Negative for rash, abrasions, lacerations, ecchymosis. Neurological: Negative for headaches.   ____________________________________________   PHYSICAL EXAM:  VITAL SIGNS: ED Triage Vitals  Enc Vitals Group     BP 11/15/17 1308 (!) 145/105     Pulse Rate 11/15/17 1308 89     Resp 11/15/17 1308 16     Temp 11/15/17 1308 98.2 F (36.8 C)     Temp Source 11/15/17 1308 Oral     SpO2 11/15/17 1308 95 %     Weight 11/15/17 1307 256 lb (116.1 kg)     Height 11/15/17 1307 5\' 11"  (1.803 m)     Head Circumference --      Peak Flow --      Pain Score 11/15/17 1307 4     Pain Loc --      Pain Edu? --  Excl. in GC? --      Constitutional: Alert and oriented. Well appearing and in no acute distress. Eyes: Conjunctivae are normal. PERRL. EOMI. No discharge. Head: Atraumatic. ENT: No frontal and maxillary sinus tenderness.      Ears: Tympanic membranes pearly gray with good landmarks. No discharge.      Nose: No congestion/rhinnorhea.      Mouth/Throat: Mucous membranes are moist. Oropharynx non-erythematous. Tonsils not enlarged. No exudates. Uvula midline. Neck: No stridor.   Hematological/Lymphatic/Immunilogical: No cervical lymphadenopathy. Cardiovascular: Normal rate, regular rhythm.  Good peripheral circulation. Respiratory: Normal respiratory effort without tachypnea or retractions. Lungs CTAB. Good air  entry to the bases with no decreased or absent breath sounds. Gastrointestinal: Bowel sounds 4 quadrants. Soft and nontender to palpation. No guarding or rigidity. No palpable masses. No distention. Musculoskeletal: Full range of motion to all extremities. No gross deformities appreciated. Neurologic:  Normal speech and language. No gross focal neurologic deficits are appreciated.  Skin:  Skin is warm, dry and intact. No rash noted.   ____________________________________________   LABS (all labs ordered are listed, but only abnormal results are displayed)  Labs Reviewed - No data to display ____________________________________________  EKG   ____________________________________________  RADIOLOGY  No results found.  ____________________________________________    PROCEDURES  Procedure(s) performed:    Procedures    Medications  ipratropium-albuterol (DUONEB) 0.5-2.5 (3) MG/3ML nebulizer solution 3 mL (3 mLs Nebulization Given 11/15/17 1431)     ____________________________________________   INITIAL IMPRESSION / ASSESSMENT AND PLAN / ED COURSE  Pertinent labs & imaging results that were available during my care of the patient were reviewed by me and considered in my medical decision making (see chart for details).  Review of the [place] CSRS was performed in accordance of the [facility] prior to dispensing any controlled drugs.  Patient's diagnosis is consistent with URI with cough and congestion. Vital signs and exam are reassuring. Patient appears well and is staying well hydrated. Patient feels comfortable going home. Patient will be discharged home with prescriptions for azithromycin, prednisone, and albuterol. Patient is to follow up with PCP as needed or otherwise directed. Patient is given ED precautions to return to the ED for any worsening or new symptoms.     ____________________________________________  FINAL CLINICAL IMPRESSION(S) / ED DIAGNOSES  Final  diagnoses:  URI with cough and congestion      NEW MEDICATIONS STARTED DURING THIS VISIT:  ED Discharge Orders        Ordered    azithromycin (ZITHROMAX Z-PAK) 250 MG tablet     11/15/17 1508    predniSONE (DELTASONE) 10 MG tablet     11/15/17 1508    albuterol (PROVENTIL HFA;VENTOLIN HFA) 108 (90 Base) MCG/ACT inhaler  Every 6 hours PRN     11/15/17 1508          This chart was dictated using voice recognition software/Dragon. Despite best efforts to proofread, errors can occur which can change the meaning. Any change was purely unintentional.    Enid Derry, PA-C 11/15/17 1543    Sharyn Creamer, MD 11/15/17 [phone]

## 2018-01-12 ENCOUNTER — Emergency Department
Admission: EM | Admit: 2018-01-12 | Discharge: 2018-01-12 | Disposition: A | Discharge status: Home / Self Care | Payer: Self-pay | Attending: Emergency Medicine | Admitting: Emergency Medicine

## 2018-01-12 ENCOUNTER — Other Ambulatory Visit: Payer: Self-pay

## 2018-01-12 DIAGNOSIS — M5441 Lumbago with sciatica, right side: Secondary | ICD-10-CM | POA: Insufficient documentation

## 2018-01-12 DIAGNOSIS — F17210 Nicotine dependence, cigarettes, uncomplicated: Secondary | ICD-10-CM | POA: Insufficient documentation

## 2018-01-12 MED ORDER — PREDNISONE 50 MG PO TABS: ORAL_TABLET | ORAL | 0 refills | Status: DC

## 2018-01-12 NOTE — ED Triage Notes (Signed)
Pt states since moving and doing heavy lifting on Saturday he has had lower back pain with tingling pain on the right side of foot and last 4 toes denies any sensation in the leg.

## 2018-01-12 NOTE — ED Provider Notes (Signed)
[facility] Emergency Department Provider Note  ____________________________________________  Time seen: Approximately 5:07 PM  I have reviewed the triage vital signs and the nursing notes.   HISTORY  Chief Complaint Back Pain and Foot Pain    HPI Mark Mcintosh is a 47 y.o. male presenting to the emergency department with mild low back discomfort with paresthesias along the dorsal aspect of the right foot.  Patient reports that he started experiencing symptoms after he was moving heavy furniture 2 days ago.  Patient also reports numbness and tingling along the lateral aspect of the right calf.  Patient is having no difficulty with dorsiflexion or plantarflexion.  He denies weakness, bowel or bladder incontinence or saddle anesthesia.  He has never experienced similar symptoms in the past.   History reviewed. No pertinent past medical history.  There are no active problems to display for this patient.   Past Surgical History:  Procedure Laterality Date  . ABDOMINAL SURGERY      Prior to Admission medications   Medication Sig Start Date End Date Taking? Authorizing Provider  albuterol (PROVENTIL HFA;VENTOLIN HFA) 108 (90 Base) MCG/ACT inhaler Inhale 2 puffs into the lungs every 6 (six) hours as needed for wheezing or shortness of breath. 11/15/17   Enid Derry, PA-C  azithromycin (ZITHROMAX Z-PAK) 250 MG tablet Take 2 tablets (500 mg) on  Day 1,  followed by 1 tablet (250 mg) once daily on Days 2 through 5. 11/15/17   Enid Derry, PA-C  naproxen (NAPROSYN) 250 MG tablet Take 1 tablet (250 mg total) by mouth 2 (two) times daily with a meal. 03/21/15   Sharman Cheek, MD  oxyCODONE-acetaminophen (ROXICET) 5-325 MG per tablet Take 1 tablet by mouth every 6 (six) hours as needed for severe pain. 03/21/15   Sharman Cheek, MD  predniSONE (DELTASONE) 50 MG tablet Take one 50 mg tablet once daily for the next 5 days. 01/12/18   Orvil Feil, PA-C     Allergies Patient has no known allergies.  No family history on file.  Social History Social History   Tobacco Use  . Smoking status: Current Every Day Smoker    Packs/day: 1.00    Types: Cigarettes  . Smokeless tobacco: Never Used  Substance Use Topics  . Alcohol use: Yes  . Drug use: Not on file     Review of Systems  Constitutional: No fever/chills Eyes: No visual changes. No discharge ENT: No upper respiratory complaints. Cardiovascular: no chest pain. Respiratory: no cough. No SOB. Gastrointestinal: No abdominal pain.  No nausea, no vomiting.  No diarrhea.  No constipation. Musculoskeletal: Patient has low back pain and right foot pain.  Skin: Negative for rash, abrasions, lacerations, ecchymosis. Neurological: Negative for headaches, focal weakness or numbness.   ____________________________________________   PHYSICAL EXAM:  VITAL SIGNS: ED Triage Vitals  Enc Vitals Group     BP 01/12/18 1612 (!) 153/105     Pulse Rate 01/12/18 1612 90     Resp 01/12/18 1612 20     Temp 01/12/18 1612 98.6 F (37 C)     Temp Source 01/12/18 1612 Oral     SpO2 01/12/18 1612 95 %     Weight 01/12/18 1613 266 lb (120.7 kg)     Height 01/12/18 1613 6' (1.829 m)     Head Circumference --      Peak Flow --      Pain Score 01/12/18 1645 3     Pain Loc --  Pain Edu? --      Excl. in GC? --      Constitutional: Alert and oriented. Well appearing and in no acute distress. Eyes: Conjunctivae are normal. PERRL. EOMI. Head: Atraumatic. Cardiovascular: Normal rate, regular rhythm. Normal S1 and S2.  Good peripheral circulation. Respiratory: Normal respiratory effort without tachypnea or retractions. Lungs CTAB. Good air entry to the bases with no decreased or absent breath sounds. Musculoskeletal: Patient is able to move all 5 right toes.  No tenderness was elicited with palpation over the right metatarsals.  Patient had no difficulty performing plantar flexion and  dorsiflexion at the right ankle or inversion/eversion.  Patient reported paresthesias with palpation along the lateral calf.  Palpable dorsalis pedis pulse, right.  Capillary refill less than 2 seconds.  Negative straight leg raise, right Neurologic:  Normal speech and language. No gross focal neurologic deficits are appreciated.  Skin:  Skin is warm, dry and intact. No rash noted.   ____________________________________________   LABS (all labs ordered are listed, but only abnormal results are displayed)  Labs Reviewed - No data to display ____________________________________________  EKG   ____________________________________________  RADIOLOGY   No results found.  ____________________________________________    PROCEDURES  Procedure(s) performed:    Procedures    Medications - No data to display   ____________________________________________   INITIAL IMPRESSION / ASSESSMENT AND PLAN / ED COURSE  Pertinent labs & imaging results that were available during my care of the patient were reviewed by me and considered in my medical decision making (see chart for details).  Review of the [place] CSRS was performed in accordance of the [facility] prior to dispensing any controlled drugs.     Assessment and plan Peroneal nerve compression Patient presents to the emergency department with right lateral calf pain and paresthesias along the second through fifth right toes after moving heavy furniture.  I am highly suspicious of peroneal nerve compression.  Patient was treated empirically with prednisone and advised to follow-up with neurosurgery as needed.  All patient questions were answered.    ____________________________________________  FINAL CLINICAL IMPRESSION(S) / ED DIAGNOSES  Final diagnoses:  Acute bilateral low back pain with right-sided sciatica      NEW MEDICATIONS STARTED DURING THIS VISIT:  ED Discharge Orders        Ordered    predniSONE (DELTASONE)  50 MG tablet     01/12/18 1640          This chart was dictated using voice recognition software/Dragon. Despite best efforts to proofread, errors can occur which can change the meaning. Any change was purely unintentional.    Orvil Feil, PA-C 01/12/18 1718    Arnaldo Natal, MD 01/13/18 Markus Daft, MD 01/20/18 2126

## 2018-01-12 NOTE — ED Notes (Signed)
Pt reports that Saturday he was moving into a new home and all day he lifted things - when he got home he started having right toe pain - he reports that the toes feel numb - he states that he has been sleeping for 2 days (for no reason) and only eaten grapes - pt also c/o lower back pain

## 2018-01-12 NOTE — ED Triage Notes (Signed)
FIRST NURSE NOTE-here for numbness to right foot.  Started after moving furniture along with some lower back pain.

## 2018-03-03 ENCOUNTER — Other Ambulatory Visit: Payer: Self-pay

## 2018-03-03 ENCOUNTER — Emergency Department: Payer: Self-pay

## 2018-03-03 ENCOUNTER — Emergency Department
Admission: EM | Admit: 2018-03-03 | Discharge: 2018-03-03 | Disposition: A | Discharge status: Home / Self Care | Payer: Self-pay | Attending: Emergency Medicine | Admitting: Emergency Medicine

## 2018-03-03 ENCOUNTER — Encounter: Payer: Self-pay | Admitting: Emergency Medicine

## 2018-03-03 DIAGNOSIS — F17210 Nicotine dependence, cigarettes, uncomplicated: Secondary | ICD-10-CM | POA: Insufficient documentation

## 2018-03-03 DIAGNOSIS — K529 Noninfective gastroenteritis and colitis, unspecified: Secondary | ICD-10-CM | POA: Insufficient documentation

## 2018-03-03 LAB — LIPASE, BLOOD: Lipase: 34 U/L (ref 11–51)

## 2018-03-03 LAB — CBC
HCT: 46.3 % (ref 40.0–52.0)
Hemoglobin: 15.9 g/dL (ref 13.0–18.0)
MCH: 32 pg (ref 26.0–34.0)
MCHC: 34.3 g/dL (ref 32.0–36.0)
MCV: 93.4 fL (ref 80.0–100.0)
PLATELETS: 262 10*3/uL (ref 150–440)
RBC: 4.96 MIL/uL (ref 4.40–5.90)
RDW: 15.7 % — AB (ref 11.5–14.5)
WBC: 7.4 10*3/uL (ref 3.8–10.6)

## 2018-03-03 LAB — COMPREHENSIVE METABOLIC PANEL
ALK PHOS: 65 U/L (ref 38–126)
ALT: 14 U/L (ref 0–44)
AST: 19 U/L (ref 15–41)
Albumin: 4 g/dL (ref 3.5–5.0)
Anion gap: 10 (ref 5–15)
BUN: 10 mg/dL (ref 6–20)
CO2: 26 mmol/L (ref 22–32)
Calcium: 9 mg/dL (ref 8.9–10.3)
Chloride: 102 mmol/L (ref 98–111)
Creatinine, Ser: 1.25 mg/dL — ABNORMAL HIGH (ref 0.61–1.24)
GFR calc non Af Amer: >60 mL/min (ref >60)
Glucose, Bld: 121 mg/dL — ABNORMAL HIGH (ref 70–99)
Potassium: 3.4 mmol/L — ABNORMAL LOW (ref 3.5–5.1)
Sodium: 138 mmol/L (ref 135–145)
Total Bilirubin: 0.7 mg/dL (ref 0.3–1.2)
Total Protein: 7.9 g/dL (ref 6.5–8.1)

## 2018-03-03 MED ORDER — SODIUM CHLORIDE 0.9 % IV SOLN
1000.0000 mL | Freq: Once | INTRAVENOUS | Status: AC
  Administered 2018-03-03: 1000 mL via INTRAVENOUS

## 2018-03-03 MED ORDER — ONDANSETRON HCL 4 MG/2ML IJ SOLN
4.0000 mg | Freq: Once | INTRAMUSCULAR | Status: AC
  Administered 2018-03-03: 4 mg via INTRAVENOUS
  Filled 2018-03-03: qty 2

## 2018-03-03 MED ORDER — MORPHINE SULFATE (PF) 4 MG/ML IV SOLN
4.0000 mg | Freq: Once | INTRAVENOUS | Status: AC
  Administered 2018-03-03: 4 mg via INTRAVENOUS
  Filled 2018-03-03: qty 1

## 2018-03-03 MED ORDER — ONDANSETRON 4 MG PO TBDP: 4.0000 mg | ORAL_TABLET | Freq: Three times a day (TID) | ORAL | 0 refills | Status: DC | PRN

## 2018-03-03 MED ORDER — IOHEXOL 300 MG/ML  SOLN
100.0000 mL | Freq: Once | INTRAMUSCULAR | Status: AC | PRN
  Administered 2018-03-03: 100 mL via INTRAVENOUS

## 2018-03-03 NOTE — ED Provider Notes (Signed)
[facility] Emergency Department Provider Note   ____________________________________________    I have reviewed the triage vital signs and the nursing notes.   HISTORY  Chief Complaint Abdominal Pain; Emesis; and Diarrhea     HPI Mark Mcintosh is a 47 y.o. male who presents with complaints of right lower quadrant abdominal pain, nausea vomiting and some diarrhea.  Patient reports the symptoms started over the last 24 to 48 hours.  He describes cramping moderate pain in his right lower quadrant.  No history of abdominal surgeries.  No fevers or chills.  Brown diarrhea.  Positive nausea decreased appetite.  Denies sick contacts, has not taken anything for this   History reviewed. No pertinent past medical history.  There are no active problems to display for this patient.   Past Surgical History:  Procedure Laterality Date  . ABDOMINAL SURGERY      Prior to Admission medications   Medication Sig Start Date End Date Taking? Authorizing Provider  albuterol (PROVENTIL HFA;VENTOLIN HFA) 108 (90 Base) MCG/ACT inhaler Inhale 2 puffs into the lungs every 6 (six) hours as needed for wheezing or shortness of breath. 11/15/17   Enid DerryWagner, Ashley, PA-C  azithromycin (ZITHROMAX Z-PAK) 250 MG tablet Take 2 tablets (500 mg) on  Day 1,  followed by 1 tablet (250 mg) once daily on Days 2 through 5. 11/15/17   Enid DerryWagner, Ashley, PA-C  naproxen (NAPROSYN) 250 MG tablet Take 1 tablet (250 mg total) by mouth 2 (two) times daily with a meal. 03/21/15   Sharman CheekStafford, Phillip, MD  ondansetron (ZOFRAN ODT) 4 MG disintegrating tablet Take 1 tablet (4 mg total) by mouth every 8 (eight) hours as needed for nausea or vomiting. 03/03/18   Jene EveryKinner, Oluwatosin Higginson, MD  oxyCODONE-acetaminophen (ROXICET) 5-325 MG per tablet Take 1 tablet by mouth every 6 (six) hours as needed for severe pain. 03/21/15   Sharman CheekStafford, Phillip, MD  predniSONE (DELTASONE) 50 MG tablet Take one 50 mg tablet once daily for the  next 5 days. 01/12/18   Orvil FeilWoods, Jaclyn M, PA-C     Allergies Patient has no known allergies.  History reviewed. No pertinent family history.  Social History Social History   Tobacco Use  . Smoking status: Current Every Day Smoker    Packs/day: 1.00    Types: Cigarettes  . Smokeless tobacco: Never Used  Substance Use Topics  . Alcohol use: Yes  . Drug use: Not on file    Review of Systems  Constitutional: No fever/chills Eyes: No visual changes.  ENT: No sore throat. Cardiovascular: Denies chest pain. Respiratory: Denies shortness of breath. Gastrointestinal: As above.   Genitourinary: Negative for dysuria. Musculoskeletal: Negative for back pain. Skin: Negative for rash. Neurological: Negative for headaches    ____________________________________________   PHYSICAL EXAM:  VITAL SIGNS: ED Triage Vitals  Enc Vitals Group     BP 03/03/18 1108 (!) 171/112     Pulse Rate 03/03/18 1108 (!) 101     Resp 03/03/18 1108 (!) 22     Temp 03/03/18 1108 98.5 F (36.9 C)     Temp Source 03/03/18 1108 Oral     SpO2 03/03/18 1108 95 %     Weight 03/03/18 1109 117.9 kg (260 lb)     Height 03/03/18 1109 1.803 m (5\' 11" )     Head Circumference --      Peak Flow --      Pain Score 03/03/18 1109 6     Pain Loc --  Pain Edu? --      Excl. in GC? --     Constitutional: Alert and oriented. No acute distress. Pleasant and interactive Eyes: Conjunctivae are normal.   Nose: No congestion/rhinnorhea. Mouth/Throat: Mucous membranes are moist.    Cardiovascular: Normal rate, regular rhythm. Grossly normal heart sounds.  Good peripheral circulation. Respiratory: Normal respiratory effort.  No retractions. Lungs CTAB. Gastrointestinal: Mild tenderness palpation the right lower quadrant. No distention.  No CVA tenderness.  Musculoskeletal: No lower extremity tenderness nor edema.  Warm and well perfused Neurologic:  Normal speech and language. No gross focal neurologic deficits  are appreciated.  Skin:  Skin is warm, dry and intact. No rash noted. Psychiatric: Mood and affect are normal. Speech and behavior are normal.  ____________________________________________   LABS (all labs ordered are listed, but only abnormal results are displayed)  Labs Reviewed  COMPREHENSIVE METABOLIC PANEL - Abnormal; Notable for the following components:      Result Value   Potassium 3.4 (*)    Glucose, Bld 121 (*)    Creatinine, Ser 1.25 (*)    All other components within normal limits  CBC - Abnormal; Notable for the following components:   RDW 15.7 (*)    All other components within normal limits  LIPASE, BLOOD  URINALYSIS, COMPLETE (UACMP) WITH MICROSCOPIC   ____________________________________________  EKG  None ____________________________________________  RADIOLOGY  CT abdomen pelvis no acute distress ____________________________________________   PROCEDURES  Procedure(s) performed: No  Procedures   Critical Care performed: No ____________________________________________   INITIAL IMPRESSION / ASSESSMENT AND PLAN / ED COURSE  Pertinent labs & imaging results that were available during my care of the patient were reviewed by me and considered in my medical decision making (see chart for details).  Patient presents with nausea decreased appetite, right lower quadrant pain.  Also with some diarrhea.  Differential includes viral gastroenteritis, less likely appendicitis or diverticulitis.  Will give IV morphine, IV Zofran, obtain imaging and reevaluate   ----------------------------------------- 2:46 PM on 03/03/2018 -----------------------------------------  Patient reports feeling much better, CT scan is unremarkable.  Appropriate for discharge at this time, will Rx Zofran, outpatient follow-up as needed    ____________________________________________   FINAL CLINICAL IMPRESSION(S) / ED DIAGNOSES  Final diagnoses:  Gastroenteritis         Note:  This document was prepared using Dragon voice recognition software and may include unintentional dictation errors.    Jene Every, MD 03/03/18 [phone]

## 2018-03-03 NOTE — ED Notes (Signed)
Pt called in the WR with no response 

## 2018-03-03 NOTE — ED Notes (Signed)
Pt found sleeping in the WR, pt easily aroused and escorted to room 10, primary RN notified.

## 2018-03-03 NOTE — ED Triage Notes (Signed)
Pt reports that Sunday he woke up vomiting and diarrhea. He also reports that he is having RLQ pain.

## 2018-10-25 ENCOUNTER — Emergency Department: Payer: Self-pay

## 2018-10-25 ENCOUNTER — Other Ambulatory Visit: Payer: Self-pay

## 2018-10-25 ENCOUNTER — Emergency Department
Admission: EM | Admit: 2018-10-25 | Discharge: 2018-10-25 | Disposition: A | Discharge status: Home / Self Care | Payer: Self-pay | Attending: Emergency Medicine | Admitting: Emergency Medicine

## 2018-10-25 DIAGNOSIS — F17210 Nicotine dependence, cigarettes, uncomplicated: Secondary | ICD-10-CM | POA: Insufficient documentation

## 2018-10-25 DIAGNOSIS — S8991XA Unspecified injury of right lower leg, initial encounter: Secondary | ICD-10-CM | POA: Insufficient documentation

## 2018-10-25 DIAGNOSIS — Y998 Other external cause status: Secondary | ICD-10-CM | POA: Insufficient documentation

## 2018-10-25 DIAGNOSIS — Z79899 Other long term (current) drug therapy: Secondary | ICD-10-CM | POA: Insufficient documentation

## 2018-10-25 DIAGNOSIS — Y92002 Bathroom of unspecified non-institutional (private) residence single-family (private) house as the place of occurrence of the external cause: Secondary | ICD-10-CM | POA: Insufficient documentation

## 2018-10-25 DIAGNOSIS — Y9389 Activity, other specified: Secondary | ICD-10-CM | POA: Insufficient documentation

## 2018-10-25 DIAGNOSIS — W010XXA Fall on same level from slipping, tripping and stumbling without subsequent striking against object, initial encounter: Secondary | ICD-10-CM | POA: Insufficient documentation

## 2018-10-25 MED ORDER — HYDROCODONE-ACETAMINOPHEN 5-325 MG PO TABS: 1.0000 | ORAL_TABLET | Freq: Four times a day (QID) | ORAL | 0 refills | Status: AC | PRN

## 2018-10-25 MED ORDER — OXYCODONE-ACETAMINOPHEN 5-325 MG PO TABS
1.0000 | ORAL_TABLET | Freq: Once | ORAL | Status: AC
  Administered 2018-10-25: 1 via ORAL
  Filled 2018-10-25: qty 1

## 2018-10-25 MED ORDER — IBUPROFEN 600 MG PO TABS: 600.0000 mg | ORAL_TABLET | Freq: Four times a day (QID) | ORAL | 0 refills | Status: DC | PRN

## 2018-10-25 NOTE — ED Provider Notes (Signed)
[facility] Emergency Department Provider Note ____________________________________________   First MD Initiated Contact with Patient 10/25/18 1021     (approximate)  I have reviewed the triage vital signs and the nursing notes.   HISTORY  Chief Complaint Knee Pain    HPI Mark Mcintosh is a 48 y.o. male with PMH as noted below who presents with right knee injury, acute onset yesterday when he was getting out of the shower and slipped.  The patient states he felt a pop.  He initially did not have much pain but then it worsened throughout the day and is associated with swelling.  No other injuries.  The patient is able to bear weight although he is walking with a limp.  No prior knee surgeries.  The patient reports a history of blood pressure especially when he is at the hospital.  He has no chest pain, difficulty breathing, headache, or other acute symptoms besides the knee pain.  No past medical history on file.  There are no active problems to display for this patient.   Past Surgical History:  Procedure Laterality Date  . ABDOMINAL SURGERY      Prior to Admission medications   Medication Sig Start Date End Date Taking? Authorizing Provider  albuterol (PROVENTIL HFA;VENTOLIN HFA) 108 (90 Base) MCG/ACT inhaler Inhale 2 puffs into the lungs every 6 (six) hours as needed for wheezing or shortness of breath. 11/15/17   Enid Derry, PA-C  azithromycin (ZITHROMAX Z-PAK) 250 MG tablet Take 2 tablets (500 mg) on  Day 1,  followed by 1 tablet (250 mg) once daily on Days 2 through 5. 11/15/17   Enid Derry, PA-C  HYDROcodone-acetaminophen (NORCO/VICODIN) 5-325 MG tablet Take 1 tablet by mouth every 6 (six) hours as needed for up to 5 days for moderate pain. 10/25/18 10/30/18  Dionne Bucy, MD  ibuprofen (ADVIL,MOTRIN) 600 MG tablet Take 1 tablet (600 mg total) by mouth every 6 (six) hours as needed. 10/25/18   Dionne Bucy, MD  naproxen (NAPROSYN)  250 MG tablet Take 1 tablet (250 mg total) by mouth 2 (two) times daily with a meal. 03/21/15   Sharman Cheek, MD  ondansetron (ZOFRAN ODT) 4 MG disintegrating tablet Take 1 tablet (4 mg total) by mouth every 8 (eight) hours as needed for nausea or vomiting. 03/03/18   Jene Every, MD  oxyCODONE-acetaminophen (ROXICET) 5-325 MG per tablet Take 1 tablet by mouth every 6 (six) hours as needed for severe pain. 03/21/15   Sharman Cheek, MD  predniSONE (DELTASONE) 50 MG tablet Take one 50 mg tablet once daily for the next 5 days. 01/12/18   Orvil Feil, PA-C    Allergies Patient has no known allergies.  No family history on file.  Social History Social History   Tobacco Use  . Smoking status: Current Every Day Smoker    Packs/day: 1.00    Types: Cigarettes  . Smokeless tobacco: Never Used  Substance Use Topics  . Alcohol use: Yes  . Drug use: Not on file    Review of Systems  Constitutional: No fever. Eyes: No redness. ENT: No neck pain. Cardiovascular: Denies chest pain. Respiratory: Denies shortness of breath. Gastrointestinal: No vomiting.  Genitourinary: Negative for flank pain.  Musculoskeletal: Positive for right knee pain. Skin: Negative for rash. Neurological: Negative for headache.   ____________________________________________   PHYSICAL EXAM:  VITAL SIGNS: ED Triage Vitals  Enc Vitals Group     BP 10/25/18 1012 (!) 184/125     Pulse  Rate 10/25/18 1004 95     Resp 10/25/18 1004 20     Temp 10/25/18 1004 97.7 F (36.5 C)     Temp Source 10/25/18 1004 Oral     SpO2 10/25/18 1004 95 %     Weight 10/25/18 1005 265 lb (120.2 kg)     Height 10/25/18 1005  (1.803 m)     Head Circumference --      Peak Flow --      Pain Score 10/25/18 1004 7     Pain Loc --      Pain Edu? --      Excl. in GC? --     Constitutional: Alert and oriented.  Relatively well appearing and in no acute distress. Eyes: Conjunctivae are normal.  Head:  Atraumatic. Nose: No congestion/rhinnorhea. Mouth/Throat: Mucous membranes are moist.   Neck: Normal range of motion.  Cardiovascular: Good peripheral circulation. Respiratory: Normal respiratory effort.   Gastrointestinal: No distention.  Musculoskeletal: No lower extremity edema.  Extremities warm and well perfused.  Right knee pain on range of motion although the patient is able to flex to 90 degrees.  Effusion with tenderness especially medially.  No deformity. Neurologic:  Normal speech and language. No gross focal neurologic deficits are appreciated.  Skin:  Skin is warm and dry. No rash noted. Psychiatric: Mood and affect are normal. Speech and behavior are normal.  ____________________________________________   LABS (all labs ordered are listed, but only abnormal results are displayed)  Labs Reviewed - No data to display ____________________________________________  EKG   ____________________________________________  RADIOLOGY  XR R knee: No acute fracture  ____________________________________________   PROCEDURES  Procedure(s) performed: No  Procedures  Critical Care performed: No ____________________________________________   INITIAL IMPRESSION / ASSESSMENT AND PLAN / ED COURSE  Pertinent labs & imaging results that were available during my care of the patient were reviewed by me and considered in my medical decision making (see chart for details).  48 year old male presents with right knee injury yesterday after slipping in the shower.  He is able to bear weight and can range the knee with pain.  On exam the patient is overall relatively well-appearing.  He is significantly hypertensive although he states he has a history of this especially when he is at the hospital and he denies any acute symptoms related to it.  He has an effusion to the right knee and some pain on range of motion but no deformity or focal bony tenderness.  Overall I suspect most  likely meniscus versus ligament injury.  We will obtain an x-ray and give analgesia.  At this time the patient has no symptoms of hypertensive urgency or any endorgan dysfunction, so there is no indication for emergent treatment of his hypertension or ED work-up.  ----------------------------------------- 12:02 PM on 10/25/2018 -----------------------------------------  The x-ray is negative.  The patient's pain is improved.  I counseled him on RICE and will give orthopedic follow-up.  Return precautions given, and he expresses understanding. ____________________________________________   FINAL CLINICAL IMPRESSION(S) / ED DIAGNOSES  Final diagnoses:  Injury of right knee, initial encounter      NEW MEDICATIONS STARTED DURING THIS VISIT:  New Prescriptions   HYDROCODONE-ACETAMINOPHEN (NORCO/VICODIN) 5-325 MG TABLET    Take 1 tablet by mouth every 6 (six) hours as needed for up to 5 days for moderate pain.   IBUPROFEN (ADVIL,MOTRIN) 600 MG TABLET    Take 1 tablet (600 mg total) by mouth every 6 (six) hours as needed.  Note:  This document was prepared using Dragon voice recognition software and may include unintentional dictation errors.    Dionne Bucy, MD 10/25/18 [phone]

## 2018-10-25 NOTE — Discharge Instructions (Addendum)
You likely have an injury of the meniscus or ligament in the knee.  You may put weight on it as tolerated and should keep an Ace bandage on it for the next several days.  Make an appointment to follow-up with the orthopedist in 1 to 2 weeks.  Return to the ER for new or worsening pain, swelling, inability to bear weight on it, or any other new or worsening symptoms that concern you.

## 2018-10-25 NOTE — ED Notes (Signed)
Pt states has crutches at home to use

## 2018-10-25 NOTE — ED Triage Notes (Signed)
Pt states that he slipped getting out of shower yesterday, caught hisself, but felt something pop in his right knee, pt states that it has continued to be painful with swelling noted, pt ambulatory with a limp, some heat noted to the inner portion of the knee

## 2019-08-07 IMAGING — CT CT ABD-PELV W/ CM
2 of 6 series · 15 of 46 positions shown, 17 images · IV contrast (APPLIED)
Comparison: October 28, 2012

CLINICAL DATA: Pain with vomiting and diarrhea

EXAM:
CT ABDOMEN AND PELVIS WITH CONTRAST
TECHNIQUE: Multidetector CT imaging of the abdomen and pelvis was performed
using the standard protocol following bolus administration of
intravenous contrast.
CONTRAST:  100mL OMNIPAQUE IOHEXOL 300 MG/ML  SOLN

[Series 2: axial st · axial · 0.80mm/px · z∈[-865,-415]mm · 12 of 104 slices shown, 14 images]
[im 7/104  soft-tissue]
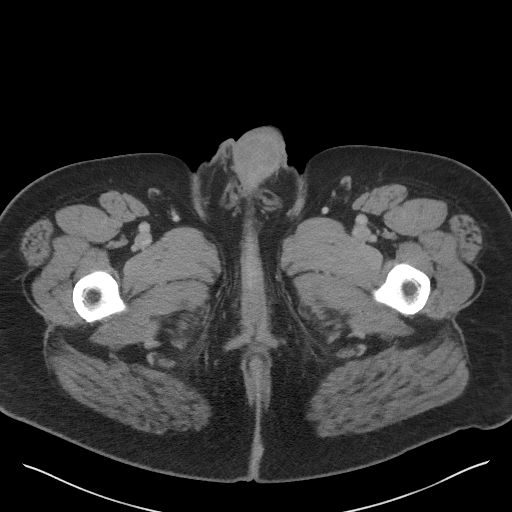
[im 7/104  bone]
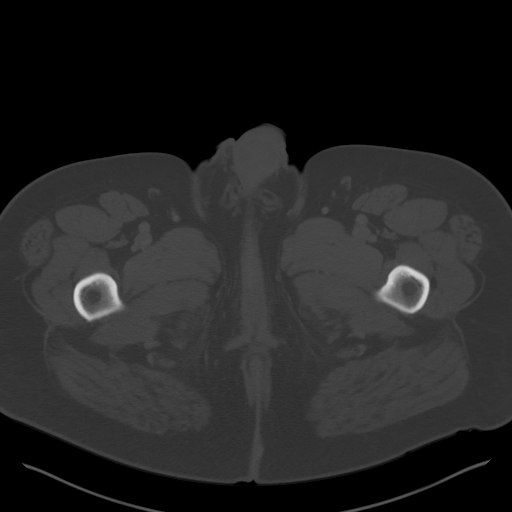
[im 13/104  soft-tissue]
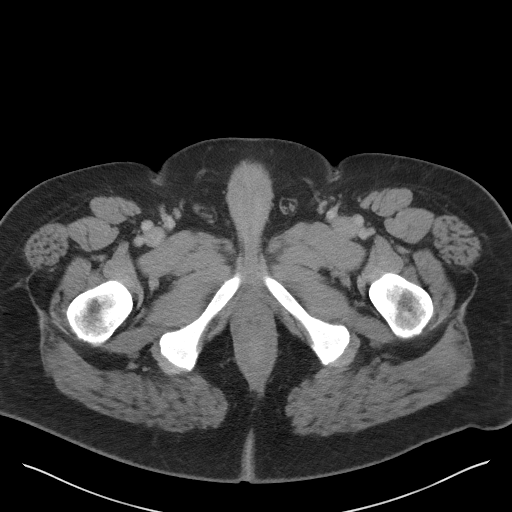
[im 26/104  soft-tissue]
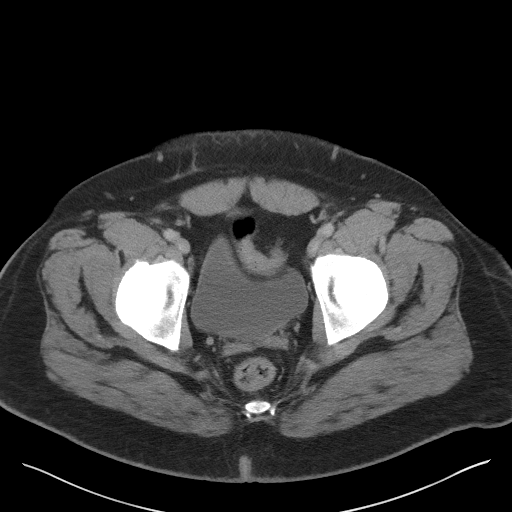
[im 33/104  soft-tissue]
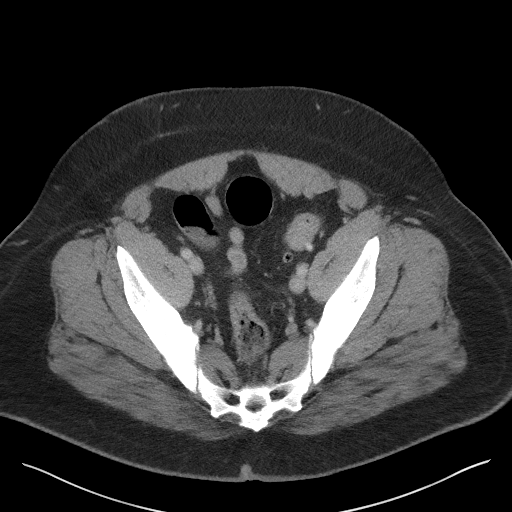
[im 39/104  soft-tissue]
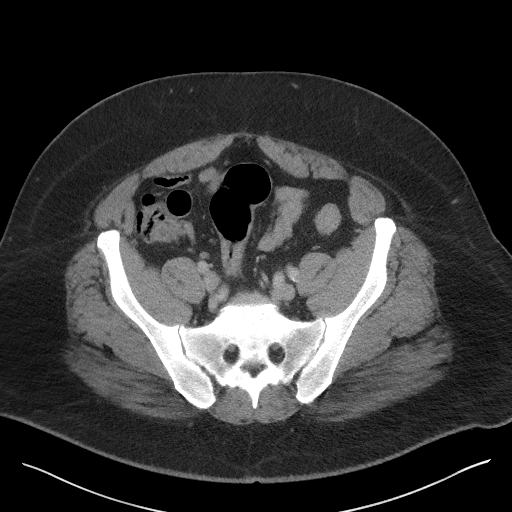
[im 46/104  soft-tissue]
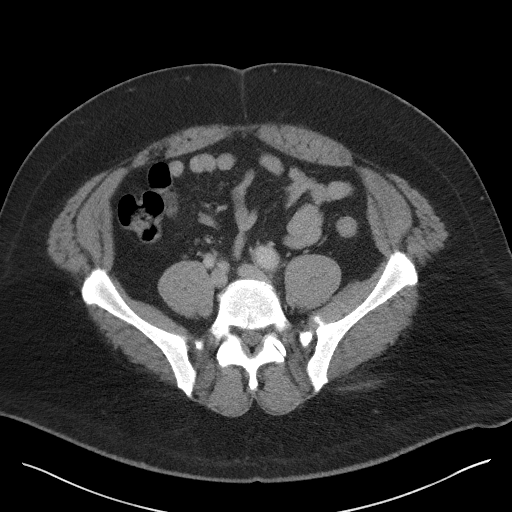
[im 58/104  soft-tissue]
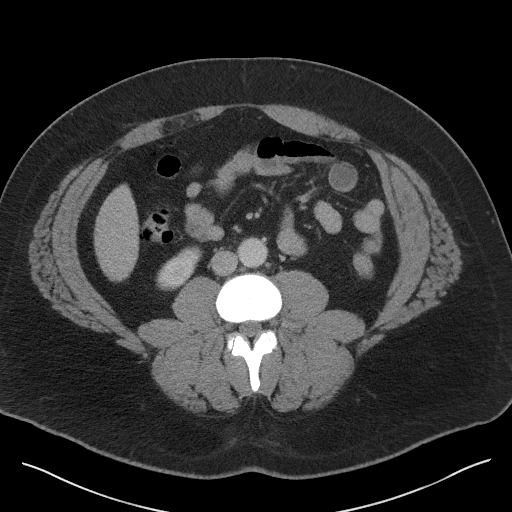
[im 65/104  soft-tissue]
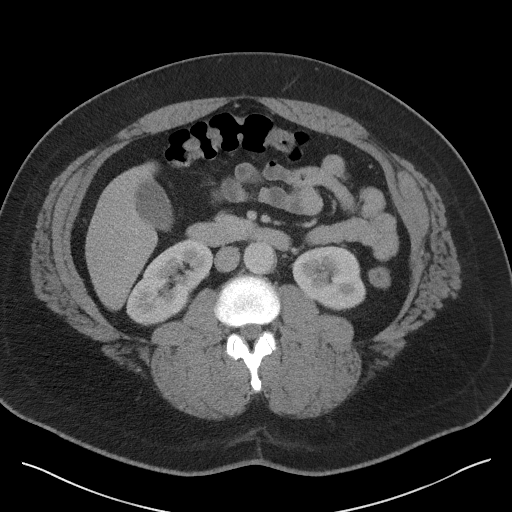
[im 71/104  soft-tissue]
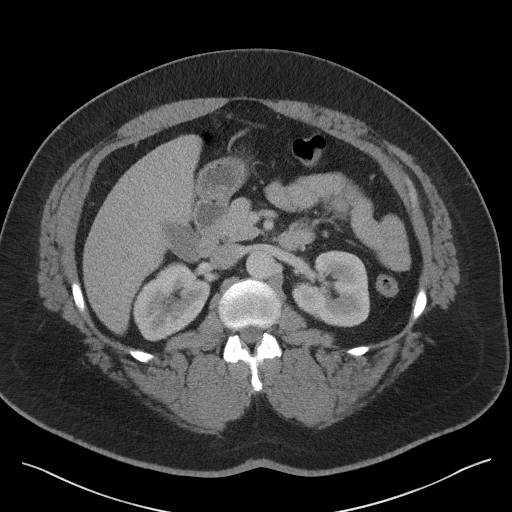
[im 71/104  bone]
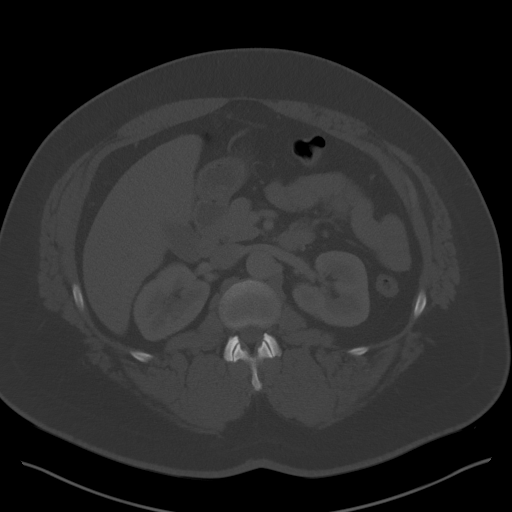
[im 78/104  soft-tissue]
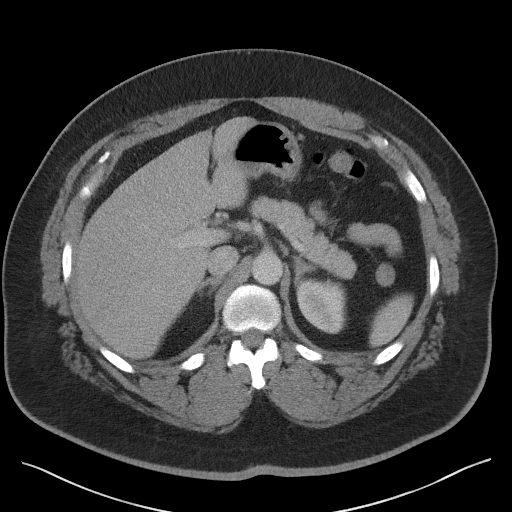
[im 91/104  soft-tissue]
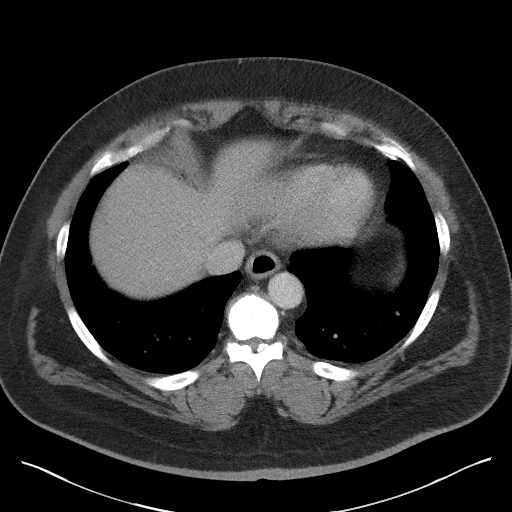
[im 97/104  soft-tissue]
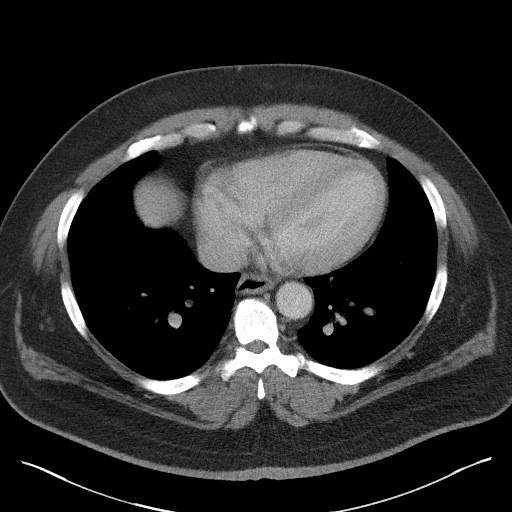

[Series 5: coronal st · coronal · 0.77mm/px · 3 of 105 slices shown]
[im 35/105  soft-tissue]
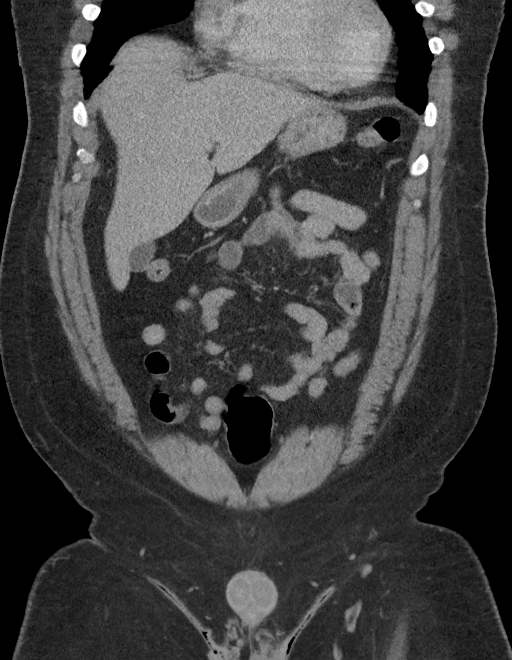
[im 47/105  soft-tissue]
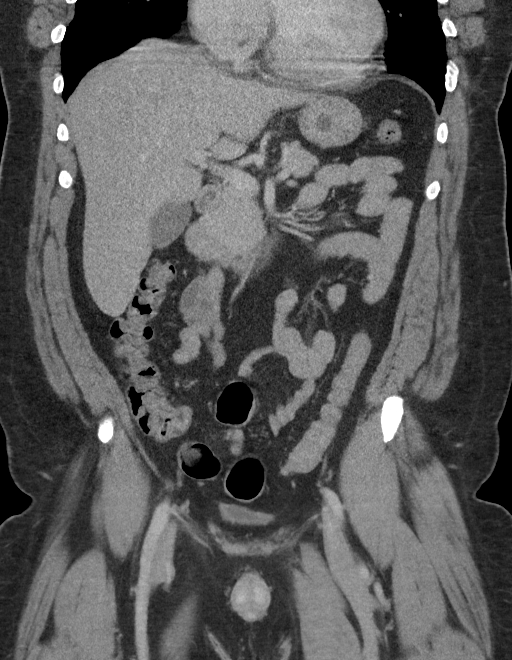
[im 58/105  soft-tissue]
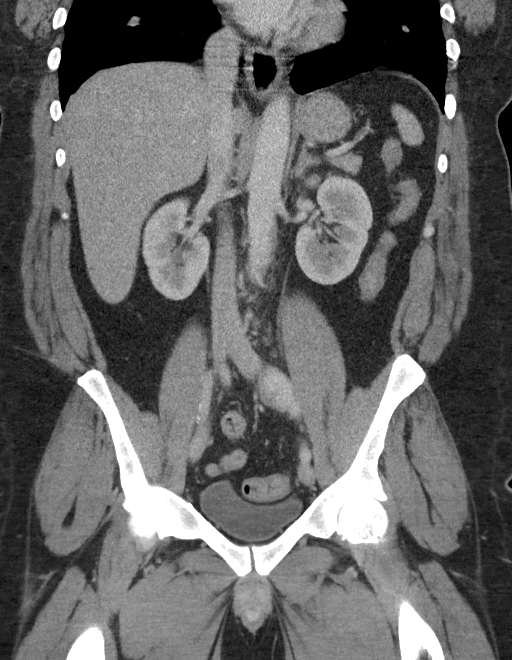

[15 of 46 positions shown; findings below may reference images not displayed]

FINDINGS: Lower chest: Lung bases are clear.

Hepatobiliary: There is a 1 cm cyst in the left lobe of the liver.
No other focal liver lesions are apparent. Gallbladder wall is not
appreciably thickened. There is no biliary duct dilatation.

Pancreas: There is no evident pancreatic mass or inflammatory focus.

Spleen: No splenic lesions are apparent.

Adrenals/Urinary Tract: Adrenals bilaterally appear normal. There is
a cyst in the lateral mid left kidney measuring 0.8 x 0.6 cm. There
is no appreciable hydronephrosis on either side. There is no renal
or ureteral calculus on either side. Urinary bladder is midline with
wall thickness within normal limits.

Stomach/Bowel: There are occasional sigmoid diverticula without
diverticulitis. There is no appreciable bowel wall or mesenteric
thickening. There is no evident bowel obstruction. No free air or
portal venous air evident.

Vascular/Lymphatic: There are scattered foci of atherosclerotic
calcification in the common iliac arteries. No evident aneurysm.
Major mesenteric vessels appear patent. No adenopathy is appreciable
in the abdomen or pelvis.

Reproductive: Prostate and seminal vesicles appear normal in size
and contour. No evident pelvic mass.

Other: Appendix appears normal. No ascites or abscess is evident in
the abdomen or pelvis. There is a small ventral hernia containing
only fat. There is fat in each inguinal ring, more on the right than
on the left.

Musculoskeletal: There is degenerative change in the lumbar spine.
There are no blastic or lytic bone lesions. No intramuscular lesions
are evident. There is slight scarring in the right anterior pelvic
wall at the site of previous inflammatory change seen on the 1962
study.
IMPRESSION: 1. Occasional sigmoid diverticula without diverticulitis. No bowel
wall thickening or bowel obstruction. No abscess. Appendix appears
normal.

2.  No renal or ureteral calculi.  No hydronephrosis.

3. Small ventral hernia containing only fat. Fat in each inguinal
ring, more on the right than on the left.

## 2019-09-26 ENCOUNTER — Emergency Department: Payer: Self-pay

## 2019-09-26 ENCOUNTER — Other Ambulatory Visit: Payer: Self-pay

## 2019-09-26 ENCOUNTER — Encounter: Payer: Self-pay | Admitting: Emergency Medicine

## 2019-09-26 ENCOUNTER — Emergency Department
Admission: EM | Admit: 2019-09-26 | Discharge: 2019-09-26 | Disposition: A | Discharge status: Home / Self Care | Payer: Self-pay | Attending: Student | Admitting: Student

## 2019-09-26 DIAGNOSIS — R52 Pain, unspecified: Secondary | ICD-10-CM

## 2019-09-26 DIAGNOSIS — F17210 Nicotine dependence, cigarettes, uncomplicated: Secondary | ICD-10-CM | POA: Insufficient documentation

## 2019-09-26 DIAGNOSIS — M79602 Pain in left arm: Secondary | ICD-10-CM | POA: Insufficient documentation

## 2019-09-26 DIAGNOSIS — M25512 Pain in left shoulder: Secondary | ICD-10-CM | POA: Insufficient documentation

## 2019-09-26 DIAGNOSIS — I10 Essential (primary) hypertension: Secondary | ICD-10-CM | POA: Insufficient documentation

## 2019-09-26 LAB — BASIC METABOLIC PANEL
Anion gap: 12 (ref 5–15)
BUN: 15 mg/dL (ref 6–20)
CO2: 24 mmol/L (ref 22–32)
Calcium: 8.8 mg/dL — ABNORMAL LOW (ref 8.9–10.3)
Chloride: 102 mmol/L (ref 98–111)
Creatinine, Ser: 1.14 mg/dL (ref 0.61–1.24)
GFR calc Af Amer: >60 mL/min (ref >60)
GFR calc non Af Amer: >60 mL/min (ref >60)
Glucose, Bld: 135 mg/dL — ABNORMAL HIGH (ref 70–99)
Potassium: 3.7 mmol/L (ref 3.5–5.1)
Sodium: 138 mmol/L (ref 135–145)

## 2019-09-26 LAB — CBC WITH DIFFERENTIAL/PLATELET
Abs Immature Granulocytes: 0.03 10*3/uL (ref 0.00–0.07)
Basophils Absolute: 0 10*3/uL (ref 0.0–0.1)
Basophils Relative: 0 %
Eosinophils Absolute: 0.2 10*3/uL (ref 0.0–0.5)
Eosinophils Relative: 3 %
HCT: 49.9 % (ref 39.0–52.0)
Hemoglobin: 15.8 g/dL (ref 13.0–17.0)
Immature Granulocytes: 0 %
Lymphocytes Relative: 26 %
Lymphs Abs: 2 10*3/uL (ref 0.7–4.0)
MCH: 30.4 pg (ref 26.0–34.0)
MCHC: 31.7 g/dL (ref 30.0–36.0)
MCV: 96 fL (ref 80.0–100.0)
Monocytes Absolute: 0.7 10*3/uL (ref 0.1–1.0)
Monocytes Relative: 9 %
Neutro Abs: 4.9 10*3/uL (ref 1.7–7.7)
Neutrophils Relative %: 62 %
Platelets: 240 10*3/uL (ref 150–400)
RBC: 5.2 MIL/uL (ref 4.22–5.81)
RDW: 15 % (ref 11.5–15.5)
WBC: 7.9 10*3/uL (ref 4.0–10.5)
nRBC: 0 % (ref 0.0–0.2)

## 2019-09-26 LAB — TROPONIN I (HIGH SENSITIVITY): Troponin I (High Sensitivity): 5 ng/L (ref <18)

## 2019-09-26 MED ORDER — KETOROLAC TROMETHAMINE 30 MG/ML IJ SOLN
30.0000 mg | Freq: Once | INTRAMUSCULAR | Status: AC
  Administered 2019-09-26: 19:00:00 30 mg via INTRAMUSCULAR
  Filled 2019-09-26: qty 1

## 2019-09-26 MED ORDER — HYDROCODONE-ACETAMINOPHEN 5-325 MG PO TABS
1.0000 | ORAL_TABLET | Freq: Once | ORAL | Status: DC
  Filled 2019-09-26: qty 1

## 2019-09-26 MED ORDER — CYCLOBENZAPRINE HCL 10 MG PO TABS: 10.0000 mg | ORAL_TABLET | Freq: Three times a day (TID) | ORAL | 0 refills | Status: DC | PRN

## 2019-09-26 MED ORDER — AMLODIPINE BESYLATE 5 MG PO TABS: 5.0000 mg | ORAL_TABLET | Freq: Every day | ORAL | 11 refills | Status: DC

## 2019-09-26 MED ORDER — MELOXICAM 15 MG PO TABS: 15.0000 mg | ORAL_TABLET | Freq: Every day | ORAL | 2 refills | Status: DC

## 2019-09-26 NOTE — Discharge Instructions (Addendum)
Follow-up with orthopedics for her shoulder. Follow-up with one of the discounted clinics through [facility] for your high blood pressure.   Take the medication as prescribed. Try to walk around the block frequently this will help decrease your blood pressure.  Cut out caffeine and sodas.  If you smoke stop smoking. Return to the emergency department if worsening. Stop by a EMS station or fire station to have your blood pressure rechecked in 1 to 2 weeks to see if the medication is helping.

## 2019-09-26 NOTE — ED Notes (Signed)
Lab to add trop onto labs sent down previously

## 2019-09-26 NOTE — ED Provider Notes (Signed)
[facility] Emergency Department Provider Note  ____________________________________________   First MD Initiated Contact with Patient 09/26/19 1816     (approximate)  I have reviewed the triage vital signs and the nursing notes.   HISTORY  Chief Complaint Shoulder Pain    HPI Mark Mcintosh is a 49 y.o. male presents emergency department complaining of left shoulder pain.  Patient states pain is radiating up into his neck and down his left arm.  He denies chest pain or shortness of breath this time.  Has been told his blood pressures been elevated before.  No regular doctor.  States he does not have insurance.  Remainder review systems negative    History reviewed. No pertinent past medical history.  There are no problems to display for this patient.   Past Surgical History:  Procedure Laterality Date  . ABDOMINAL SURGERY      Prior to Admission medications   Medication Sig Start Date End Date Taking? Authorizing Provider  albuterol (PROVENTIL HFA;VENTOLIN HFA) 108 (90 Base) MCG/ACT inhaler Inhale 2 puffs into the lungs every 6 (six) hours as needed for wheezing or shortness of breath. 11/15/17   Enid Derry, PA-C  amLODipine (NORVASC) 5 MG tablet Take 1 tablet (5 mg total) by mouth daily. 09/26/19 09/25/20  Sherrie Mustache Roselyn Bering, PA-C  cyclobenzaprine (FLEXERIL) 10 MG tablet Take 1 tablet (10 mg total) by mouth 3 (three) times daily as needed. 09/26/19   Legaci Tarman, Roselyn Bering, PA-C  meloxicam (MOBIC) 15 MG tablet Take 1 tablet (15 mg total) by mouth daily. 09/26/19 09/25/20  Faythe Ghee, PA-C    Allergies Patient has no known allergies.  History reviewed. No pertinent family history.  Social History Social History   Tobacco Use  . Smoking status: Current Every Day Smoker    Packs/day: 1.00    Types: Cigarettes  . Smokeless tobacco: Never Used  Substance Use Topics  . Alcohol use: Yes  . Drug use: Not on file    Review of  Systems  Constitutional: No fever/chills Eyes: No visual changes. ENT: No sore throat. Respiratory: Denies cough Cardiovascular: Denies chest pain Gastrointestinal: Denies abdominal pain Genitourinary: Negative for dysuria. Musculoskeletal: Negative for back pain.  Left shoulder pain Skin: Negative for rash. Psychiatric: no mood changes,     ____________________________________________   PHYSICAL EXAM:  VITAL SIGNS: ED Triage Vitals  Enc Vitals Group     BP 09/26/19 1757 (!) 187/126     Pulse Rate 09/26/19 1757 95     Resp 09/26/19 1757 16     Temp 09/26/19 1757 98.5 F (36.9 C)     Temp src --      SpO2 09/26/19 1757 96 %     Weight 09/26/19 1752 260 lb (117.9 kg)     Height 09/26/19 1752 5\' 10"  (1.778 m)     Head Circumference --      Peak Flow --      Pain Score 09/26/19 1752 8     Pain Loc --      Pain Edu? --      Excl. in GC? --     Constitutional: Alert and oriented. Well appearing and in no acute distress. Eyes: Conjunctivae are normal.  Head: Atraumatic. Nose: No congestion/rhinnorhea. Mouth/Throat: Mucous membranes are moist.   Neck:  supple no lymphadenopathy noted Cardiovascular: Normal rate, regular rhythm. Heart sounds are normal Respiratory: Normal respiratory effort.  No retractions, lungs c t a  GU: deferred Musculoskeletal: Decreased range of  motion of the left shoulder.  Tender at the ACJ.  Tender in the trapezius muscle leading into the left side of the neck.  Small amount of swelling noted.  Neurovascular is intact neurologic:  Normal speech and language.  Skin:  Skin is warm, dry and intact. No rash noted. Psychiatric: Mood and affect are normal. Speech and behavior are normal.  ____________________________________________   LABS (all labs ordered are listed, but only abnormal results are displayed)  Labs Reviewed  BASIC METABOLIC PANEL - Abnormal; Notable for the following components:      Result Value   Glucose, Bld 135 (*)     Calcium 8.8 (*)    All other components within normal limits  CBC WITH DIFFERENTIAL/PLATELET  TROPONIN I (HIGH SENSITIVITY)   ____________________________________________   ____________________________________________  RADIOLOGY  Chest x-ray and x-ray left shoulder are both negative for any acute abnormalities  ____________________________________________   PROCEDURES  Procedure(s) performed: Sling   Procedures    ____________________________________________   INITIAL IMPRESSION / ASSESSMENT AND PLAN / ED COURSE  Pertinent labs & imaging results that were available during my care of the patient were reviewed by me and considered in my medical decision making (see chart for details).   Patient is 49 year old male presents emergency department with left shoulder pain.  See HPI  Physical exam patient appears well.  Left shoulder is tender.  Blood pressure is elevated.  X-ray of the left shoulder shows osteophytes but no acute abnormality Chest x-ray is normal  CBC is normal, metabolic panel  is normal, troponin is negative  EKG shows normal sinus rhythm with some PACs  Explained all the findings to the patient.  Explained to him that he does need to see a primary care provider for his blood pressure.  He was given a booklet with several discounted health services through [facility].  He was given a prescription for amlodipine 5 mg daily, meloxicam daily, Flexeril 10 mg 3 times daily.  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  This will make drowsy.  States he understands will comply.  Again 2 days off.  He was discharged stable condition.    Mark Mcintosh was evaluated in Emergency Department on 09/26/2019 for the symptoms described in the history of present illness. He was evaluated in the context of the global COVID-19 pandemic, which necessitated consideration that the patient might be at risk for infection with the SARS-CoV-2 virus that  causes COVID-19. Institutional protocols and algorithms that pertain to the evaluation of patients at risk for COVID-19 are in a state of rapid change based on information released by regulatory bodies including the CDC and federal and state organizations. These policies and algorithms were followed during the patient's care in the ED.   As part of my medical decision making, I reviewed the following data within the [place] notes reviewed and incorporated, Labs reviewed , EKG interpreted NSR, Old chart reviewed, Radiograph reviewed see above, Notes from prior ED visits and [place] Controlled Substance Database  ____________________________________________   FINAL CLINICAL IMPRESSION(S) / ED DIAGNOSES  Final diagnoses:  Acute pain of left shoulder  Essential hypertension      NEW MEDICATIONS STARTED DURING THIS VISIT:  New Prescriptions   AMLODIPINE (NORVASC) 5 MG TABLET    Take 1 tablet (5 mg total) by mouth daily.   CYCLOBENZAPRINE (FLEXERIL) 10 MG TABLET    Take 1 tablet (10 mg total) by mouth 3 (three) times daily  as needed.   MELOXICAM (MOBIC) 15 MG TABLET    Take 1 tablet (15 mg total) by mouth daily.     Note:  This document was prepared using Dragon voice recognition software and may include unintentional dictation errors.    Faythe Ghee, PA-C 09/26/19 Serena Croissant    Miguel Aschoff., MD 09/26/19 [phone]

## 2019-09-26 NOTE — ED Notes (Signed)
Sling applied to left arm. Patient discharged to home.

## 2019-09-26 NOTE — ED Notes (Signed)
Pt states he drove himself to the hospital but will call to see if he can get a ride so that he can take pain medication.

## 2019-09-26 NOTE — ED Notes (Signed)
Patient reports feels like he pulled a muscle to left shoulder, reports moving furniture on Tuesday and heard a pop on his shoulder, pain has been increasing today unable to lift arm above head with out pain, also reports now having numbness radiating down left shoulder, this began today. Awaiting md eval. Labs drawn in triage. ekg completed.

## 2019-09-26 NOTE — ED Notes (Signed)
This RN spoke with Dr. Mayford Knife regarding patient care, see orders.

## 2019-09-26 NOTE — ED Triage Notes (Signed)
Pt states on Tuesday was doing work around the house, made a reaching motion, felt a pop in his L shoulder, pt states pain now radiates up to his neck and his L arm is numb. Pt c/o pain 8/10 to L arm.

## 2019-11-05 ENCOUNTER — Other Ambulatory Visit: Payer: Self-pay

## 2019-11-05 ENCOUNTER — Emergency Department
Admission: EM | Admit: 2019-11-05 | Discharge: 2019-11-06 | Discharge status: Against Medical Advice | Payer: Self-pay | Attending: Emergency Medicine | Admitting: Emergency Medicine

## 2019-11-05 ENCOUNTER — Encounter: Payer: Self-pay | Admitting: Emergency Medicine

## 2019-11-05 DIAGNOSIS — Z5321 Procedure and treatment not carried out due to patient leaving prior to being seen by health care provider: Secondary | ICD-10-CM | POA: Insufficient documentation

## 2019-11-05 HISTORY — DX: Essential (primary) hypertension: I10

## 2019-11-05 HISTORY — DX: Unspecified osteoarthritis, unspecified site: M19.90

## 2019-11-05 LAB — BASIC METABOLIC PANEL
Anion gap: 11 (ref 5–15)
BUN: 9 mg/dL (ref 6–20)
CO2: 23 mmol/L (ref 22–32)
Calcium: 8.8 mg/dL — ABNORMAL LOW (ref 8.9–10.3)
Chloride: 103 mmol/L (ref 98–111)
Creatinine, Ser: 1.03 mg/dL (ref 0.61–1.24)
GFR calc Af Amer: >60 mL/min (ref >60)
GFR calc non Af Amer: >60 mL/min (ref >60)
Glucose, Bld: 125 mg/dL — ABNORMAL HIGH (ref 70–99)
Potassium: 4.7 mmol/L (ref 3.5–5.1)
Sodium: 137 mmol/L (ref 135–145)

## 2019-11-05 LAB — CBC
HCT: 53.2 % — ABNORMAL HIGH (ref 39.0–52.0)
Hemoglobin: 16.8 g/dL (ref 13.0–17.0)
MCH: 30.7 pg (ref 26.0–34.0)
MCHC: 31.6 g/dL (ref 30.0–36.0)
MCV: 97.3 fL (ref 80.0–100.0)
Platelets: 240 10*3/uL (ref 150–400)
RBC: 5.47 MIL/uL (ref 4.22–5.81)
RDW: 14.5 % (ref 11.5–15.5)
WBC: 7.2 10*3/uL (ref 4.0–10.5)
nRBC: 0 % (ref 0.0–0.2)

## 2019-11-05 NOTE — ED Triage Notes (Signed)
Patient states that about 30 minutes after taking his blood pressure medications he starts to feel light headed and blurred vision. Patient states that this has been happening for the past week. Patient states that he started the blood pressure medication about a month ago.

## 2019-11-06 NOTE — ED Notes (Signed)
Pt called 3 times in the lobby no answer. 

## 2019-11-08 ENCOUNTER — Telehealth: Payer: Self-pay | Admitting: Emergency Medicine

## 2019-11-08 NOTE — Telephone Encounter (Signed)
Called patient due to lwot to inquire about condition and follow up plans.  He says he does not have pcp and he is out of work right now. I told him that he really needs to get pcp due to his bp issues, and  I gave him options of [facility] if he qualifies and [facility].

## 2020-03-29 ENCOUNTER — Other Ambulatory Visit: Payer: Self-pay

## 2020-03-29 ENCOUNTER — Emergency Department
Admission: EM | Admit: 2020-03-29 | Discharge: 2020-03-29 | Disposition: A | Discharge status: Home / Self Care | Payer: Self-pay | Attending: Emergency Medicine | Admitting: Emergency Medicine

## 2020-03-29 ENCOUNTER — Encounter: Payer: Self-pay | Admitting: Emergency Medicine

## 2020-03-29 DIAGNOSIS — F17210 Nicotine dependence, cigarettes, uncomplicated: Secondary | ICD-10-CM | POA: Insufficient documentation

## 2020-03-29 DIAGNOSIS — I10 Essential (primary) hypertension: Secondary | ICD-10-CM | POA: Insufficient documentation

## 2020-03-29 DIAGNOSIS — L0591 Pilonidal cyst without abscess: Secondary | ICD-10-CM

## 2020-03-29 DIAGNOSIS — L0501 Pilonidal cyst with abscess: Secondary | ICD-10-CM | POA: Insufficient documentation

## 2020-03-29 DIAGNOSIS — B9689 Other specified bacterial agents as the cause of diseases classified elsewhere: Secondary | ICD-10-CM | POA: Insufficient documentation

## 2020-03-29 DIAGNOSIS — Z79899 Other long term (current) drug therapy: Secondary | ICD-10-CM | POA: Insufficient documentation

## 2020-03-29 MED ORDER — HYDROMORPHONE HCL 1 MG/ML IJ SOLN
1.0000 mg | Freq: Once | INTRAMUSCULAR | Status: AC
  Administered 2020-03-29: 1 mg via INTRAMUSCULAR
  Filled 2020-03-29: qty 1

## 2020-03-29 MED ORDER — LIDOCAINE HCL (PF) 1 % IJ SOLN
5.0000 mL | Freq: Once | INTRAMUSCULAR | Status: AC
  Administered 2020-03-29: 5 mL
  Filled 2020-03-29: qty 5

## 2020-03-29 MED ORDER — OXYCODONE-ACETAMINOPHEN 7.5-325 MG PO TABS: 1.0000 | ORAL_TABLET | Freq: Four times a day (QID) | ORAL | 0 refills | Status: DC | PRN

## 2020-03-29 MED ORDER — SULFAMETHOXAZOLE-TRIMETHOPRIM 800-160 MG PO TABS
1.0000 | ORAL_TABLET | Freq: Two times a day (BID) | ORAL | 0 refills | Status: DC
Start: 2020-03-29 — End: 2020-12-09

## 2020-03-29 MED ORDER — SULFAMETHOXAZOLE-TRIMETHOPRIM 800-160 MG PO TABS
1.0000 | ORAL_TABLET | Freq: Two times a day (BID) | ORAL | 0 refills | Status: DC
Start: 2020-03-29 — End: 2020-03-29

## 2020-03-29 MED ORDER — LIDOCAINE HCL URETHRAL/MUCOSAL 2 % EX GEL
1.0000 "application " | Freq: Once | CUTANEOUS | Status: AC
  Administered 2020-03-29: 1 via TOPICAL
  Filled 2020-03-29: qty 5

## 2020-03-29 MED ORDER — OXYCODONE-ACETAMINOPHEN 5-325 MG PO TABS
1.0000 | ORAL_TABLET | Freq: Once | ORAL | Status: AC
  Administered 2020-03-29: 1 via ORAL
  Filled 2020-03-29: qty 1

## 2020-03-29 NOTE — ED Provider Notes (Signed)
[facility] Emergency Department Provider Note   ____________________________________________   First MD Initiated Contact with Patient 03/29/20 [phone]     (approximate)  I have reviewed the triage vital signs and the nursing notes.   HISTORY  Chief Complaint Hemorrhoids    HPI Mark Mcintosh is a 49 y.o. male patient presents with rectal pain for nodule lesion which he believes of hemorrhoid.  Patient states his fiance tried to reduce the hemorrhoid but the pain persists.  Onset of complaint is 4 days.  Patient denies constipation.  Patient denies blood in stool or tissue paper.  Rates pain as a 10/10.  Described pain as "pressure/sore".  No other palliative measure for complaint.         Past Medical History:  Diagnosis Date  . Arthritis   . Hypertension     There are no problems to display for this patient.   Past Surgical History:  Procedure Laterality Date  . ABDOMINAL SURGERY      Prior to Admission medications   Medication Sig Start Date End Date Taking? Authorizing Provider  albuterol (PROVENTIL HFA;VENTOLIN HFA) 108 (90 Base) MCG/ACT inhaler Inhale 2 puffs into the lungs every 6 (six) hours as needed for wheezing or shortness of breath. 11/15/17   Enid Derry, PA-C  amLODipine (NORVASC) 5 MG tablet Take 1 tablet (5 mg total) by mouth daily. 09/26/19 09/25/20  Sherrie Mustache Roselyn Bering, PA-C  oxyCODONE-acetaminophen (PERCOCET) 7.5-325 MG tablet Take 1 tablet by mouth every 6 (six) hours as needed for severe pain. 03/29/20   Joni Reining, PA-C  sulfamethoxazole-trimethoprim (BACTRIM DS) 800-160 MG tablet Take 1 tablet by mouth 2 (two) times daily. 03/29/20   Joni Reining, PA-C    Allergies Patient has no known allergies.  No family history on file.  Social History Social History   Tobacco Use  . Smoking status: Current Every Day Smoker    Packs/day: 1.00    Types: Cigarettes  . Smokeless tobacco: Never Used  Substance Use Topics    . Alcohol use: Yes  . Drug use: Not on file    Review of Systems Constitutional: No fever/chills Eyes: No visual changes. ENT: No sore throat. Cardiovascular: Denies chest pain. Respiratory: Denies shortness of breath. Gastrointestinal: No abdominal pain.  No nausea, no vomiting.  No diarrhea.  No constipation. Genitourinary: Negative for dysuria. Musculoskeletal: Negative for back pain. Skin: Negative for rash.  Nodule lesion pilonidal area. Neurological: Negative for headaches, focal weakness or numbness.   ____________________________________________   PHYSICAL EXAM:  VITAL SIGNS: ED Triage Vitals  Enc Vitals Group     BP 03/29/20 0512 (!) 145/100     Pulse Rate 03/29/20 0512 100     Resp 03/29/20 0512 18     Temp 03/29/20 0512 97.8 F (36.6 C)     Temp Source 03/29/20 0512 Oral     SpO2 03/29/20 0512 95 %     Weight 03/29/20 0510 270 lb (122.5 kg)     Height 03/29/20 0510 5\' 11"  (1.803 m)     Head Circumference --      Peak Flow --      Pain Score 03/29/20 0510 10     Pain Loc --      Pain Edu? --      Excl. in GC? --    Constitutional: Alert and oriented. Well appearing and in no acute distress. Cardiovascular: Normal rate, regular rhythm. Grossly normal heart sounds.  Good peripheral circulation.  Elevated  blood pressure. Respiratory: Normal respiratory effort.  No retractions. Lungs CTAB. Gastrointestinal: Soft and nontender. No distention. No abdominal bruits. No CVA tenderness. Genitourinary: Deferred Musculoskeletal: No lower extremity tenderness nor edema.  No joint effusions. Neurologic:  Normal speech and language. No gross focal neurologic deficits are appreciated. No gait instability. Skin:  Skin is warm, dry and intact.  Ulnar area reveals edematous/erythematous nodule lesion.   Psychiatric: Mood and affect are normal. Speech and behavior are normal.  ____________________________________________   LABS (all labs ordered are listed, but only  abnormal results are displayed)  Labs Reviewed - No data to display ____________________________________________  EKG   ____________________________________________  RADIOLOGY  ED MD interpretation:    Official radiology report(s): No results found.  ____________________________________________   PROCEDURES  Procedure(s) performed (including Critical Care):  Marland KitchenMarland KitchenIncision and Drainage  Date/Time: 03/29/2020 9:48 AM Performed by: Joni Reining, PA-C Authorized by: Joni Reining, PA-C   Consent:    Consent obtained:  Verbal   Consent given by:  Patient   Risks discussed:  Bleeding, incomplete drainage and pain Location:    Type:  Pilonidal cyst   Location:  Anogenital   Anogenital location:  Pilonidal Pre-procedure details:    Skin preparation:  Betadine Anesthesia (see MAR for exact dosages):    Anesthesia method:  Topical application and local infiltration   Topical anesthetic:  Lidocaine gel   Local anesthetic:  Lidocaine 1% w/o epi Procedure type:    Complexity:  Simple Procedure details:    Needle aspiration: no     Incision types:  Single with marsupialization   Incision depth:  Dermal   Scalpel blade:  11   Wound management:  Probed and deloculated and irrigated with saline   Drainage:  Purulent   Drainage amount:  Copious   Wound treatment:  Wound left open   Packing materials:  1/4 in iodoform gauze   Amount 1/4" iodoform:  8cm Post-procedure details:    Patient tolerance of procedure:  Tolerated well, no immediate complications     ____________________________________________   INITIAL IMPRESSION / ASSESSMENT AND PLAN / ED COURSE  As part of my medical decision making, I reviewed the following data within the electronic MEDICAL RECORD NUMBER     Patient presents with nodular lesion paranasal area consistent with pilonidal cyst.  See procedure note for incision and drainage.  Patient given discharge care instructions and advised take medication  as directed.  Return back in 2 days for wound check.    Mark Mcintosh was evaluated in Emergency Department on 03/29/2020 for the symptoms described in the history of present illness. He was evaluated in the context of the global COVID-19 pandemic, which necessitated consideration that the patient might be at risk for infection with the SARS-CoV-2 virus that causes COVID-19. Institutional protocols and algorithms that pertain to the evaluation of patients at risk for COVID-19 are in a state of rapid change based on information released by regulatory bodies including the CDC and federal and state organizations. These policies and algorithms were followed during the patient's care in the ED.       ____________________________________________   FINAL CLINICAL IMPRESSION(S) / ED DIAGNOSES  Final diagnoses:  Infected pilonidal cyst     ED Discharge Orders         Ordered    sulfamethoxazole-trimethoprim (BACTRIM DS) 800-160 MG tablet  2 times daily     Discontinue  Reprint     03/29/20 0946    oxyCODONE-acetaminophen (PERCOCET) 7.5-325 MG tablet  Every 6 hours PRN     Discontinue  Reprint     03/29/20 0946           Note:  This document was prepared using Dragon voice recognition software and may include unintentional dictation errors.    Joni Reining, PA-C 03/29/20 2202    Sharyn Creamer, MD 03/29/20 1341

## 2020-03-29 NOTE — Discharge Instructions (Signed)
Follow discharge care instructions and take medications as directed.  Return back in 2 days for reevaluation.

## 2020-03-29 NOTE — ED Triage Notes (Signed)
Pt requesting bed to lay down, informed pt do not have a bed to place in the lobby but he is welcome to sit in a chair; pt reports has been standing whole time.

## 2020-03-29 NOTE — ED Notes (Addendum)
See triage note   Presents with some rectal pain/hemorroid pain  States no blood noted  States he thinks it might be an abscess area  Having increased pain

## 2020-03-29 NOTE — ED Triage Notes (Signed)
Patient ambulatory to triage with steady gait, without difficulty or distress noted; pt reports on Saturday was having BM, coughed and felt sharp pain in rectum; pt denies any accomp symptoms or bleeding, only rectal pain; st his fiance was able to push him hemorrhoid back in but pain persists

## 2020-12-09 ENCOUNTER — Emergency Department
Admission: EM | Admit: 2020-12-09 | Discharge: 2020-12-09 | Disposition: A | Discharge status: Home / Self Care | Payer: 59 | Attending: Emergency Medicine | Admitting: Emergency Medicine

## 2020-12-09 ENCOUNTER — Encounter: Payer: Self-pay | Admitting: Emergency Medicine

## 2020-12-09 ENCOUNTER — Other Ambulatory Visit: Payer: Self-pay

## 2020-12-09 DIAGNOSIS — R739 Hyperglycemia, unspecified: Secondary | ICD-10-CM | POA: Diagnosis not present

## 2020-12-09 DIAGNOSIS — I10 Essential (primary) hypertension: Secondary | ICD-10-CM | POA: Diagnosis not present

## 2020-12-09 DIAGNOSIS — L988 Other specified disorders of the skin and subcutaneous tissue: Secondary | ICD-10-CM | POA: Diagnosis present

## 2020-12-09 DIAGNOSIS — F17210 Nicotine dependence, cigarettes, uncomplicated: Secondary | ICD-10-CM | POA: Insufficient documentation

## 2020-12-09 DIAGNOSIS — R7309 Other abnormal glucose: Secondary | ICD-10-CM

## 2020-12-09 DIAGNOSIS — Z9119 Patient's noncompliance with other medical treatment and regimen: Secondary | ICD-10-CM | POA: Diagnosis not present

## 2020-12-09 DIAGNOSIS — K130 Diseases of lips: Secondary | ICD-10-CM | POA: Insufficient documentation

## 2020-12-09 DIAGNOSIS — Z91199 Patient's noncompliance with other medical treatment and regimen due to unspecified reason: Secondary | ICD-10-CM

## 2020-12-09 LAB — CBC WITH DIFFERENTIAL/PLATELET
Abs Immature Granulocytes: 0.01 10*3/uL (ref 0.00–0.07)
Basophils Absolute: 0 10*3/uL (ref 0.0–0.1)
Basophils Relative: 0 %
Eosinophils Absolute: 0.2 10*3/uL (ref 0.0–0.5)
Eosinophils Relative: 3 %
HCT: 52.5 % — ABNORMAL HIGH (ref 39.0–52.0)
Hemoglobin: 16.8 g/dL (ref 13.0–17.0)
Immature Granulocytes: 0 %
Lymphocytes Relative: 27 %
Lymphs Abs: 1.7 10*3/uL (ref 0.7–4.0)
MCH: 30.9 pg (ref 26.0–34.0)
MCHC: 32 g/dL (ref 30.0–36.0)
MCV: 96.5 fL (ref 80.0–100.0)
Monocytes Absolute: 0.6 10*3/uL (ref 0.1–1.0)
Monocytes Relative: 9 %
Neutro Abs: 3.9 10*3/uL (ref 1.7–7.7)
Neutrophils Relative %: 61 %
Platelets: 251 10*3/uL (ref 150–400)
RBC: 5.44 MIL/uL (ref 4.22–5.81)
RDW: 14.6 % (ref 11.5–15.5)
WBC: 6.4 10*3/uL (ref 4.0–10.5)
nRBC: 0 % (ref 0.0–0.2)

## 2020-12-09 LAB — URINALYSIS, COMPLETE (UACMP) WITH MICROSCOPIC
Bilirubin Urine: NEGATIVE
Glucose, UA: NEGATIVE mg/dL
Ketones, ur: NEGATIVE mg/dL
Leukocytes,Ua: NEGATIVE
Nitrite: NEGATIVE
Protein, ur: NEGATIVE mg/dL
Specific Gravity, Urine: 1.004 — ABNORMAL LOW (ref 1.005–1.030)
Squamous Epithelial / HPF: NONE SEEN (ref 0–5)
WBC, UA: NONE SEEN WBC/hpf (ref 0–5)
pH: 7 (ref 5.0–8.0)

## 2020-12-09 LAB — BASIC METABOLIC PANEL
Anion gap: 14 (ref 5–15)
BUN: 9 mg/dL (ref 6–20)
CO2: 27 mmol/L (ref 22–32)
Calcium: 9.2 mg/dL (ref 8.9–10.3)
Chloride: 97 mmol/L — ABNORMAL LOW (ref 98–111)
Creatinine, Ser: 1.23 mg/dL (ref 0.61–1.24)
GFR, Estimated: >60 mL/min (ref >60)
Glucose, Bld: 168 mg/dL — ABNORMAL HIGH (ref 70–99)
Potassium: 3.3 mmol/L — ABNORMAL LOW (ref 3.5–5.1)
Sodium: 138 mmol/L (ref 135–145)

## 2020-12-09 MED ORDER — AMLODIPINE BESYLATE 5 MG PO TABS
5.0000 mg | ORAL_TABLET | Freq: Every day | ORAL | 2 refills | Status: DC
Start: 2020-12-09 — End: 2021-12-26

## 2020-12-09 MED ORDER — BETAMETHASONE VALERATE 0.1 % EX OINT
1.0000 | TOPICAL_OINTMENT | Freq: Two times a day (BID) | CUTANEOUS | 0 refills | Status: DC
Start: 2020-12-09 — End: 2024-01-09

## 2020-12-09 NOTE — ED Triage Notes (Signed)
Pt reports that his lips started getting really dry and cracked and he had to keep applying chapstick. Pt reports this am when he woke up his lips were dry, cracked swollen and bleeding.

## 2020-12-09 NOTE — Discharge Instructions (Signed)
Call Dr. Zada Finders on Monday to make an appointment.  Is important that she get a primary care provider and make regular appointments to control your blood pressure and also to evaluate your blood sugar which was elevated in the ED today.  Your blood sugar in the ED today was 168.  Your initial blood pressure was 139/118.  A prescription for blood pressure medication was sent to your pharmacy this is a supply for 90 days which should give you plenty of time to get an appointment with Dr.Olmedo.  You must take your blood pressure medication every day to have control of your blood pressure.  An ointment was sent to the pharmacy for your lips.  Apply a very small amount to your lips twice a day.  You may need to follow-up with a dermatologist if this is not improving.

## 2020-12-09 NOTE — ED Notes (Signed)
Pt had given urine sample but was noted to not be in room. Pt states he doesn't know what happened to it. Informed that another sample is needed.

## 2020-12-09 NOTE — ED Notes (Signed)
Pt used a new chapstick and lips are more cracked and swollen. Airway patent.

## 2020-12-09 NOTE — ED Provider Notes (Signed)
[facility] Emergency Department Provider Note  ____________________________________________   Event Date/Time   First MD Initiated Contact with Patient 12/09/20 0813     (approximate)  I have reviewed the triage vital signs and the nursing notes.   HISTORY  Chief Complaint Oral Swelling and Facial Pain   HPI Mark Mcintosh is a 50 y.o. male presents to the ED with complaint of his lips being dry and chapped.  Patient has been using using Chapstick without any relief.  He denies any fever or chills.  No difficulty with breathing or swallowing.  Patient's blood pressure was noted to be elevated at 139/118 and multiple blood pressures were reviewed each time he has been in the ED which have all been elevated.  In 2016 he was to return to [facility] primary care for reevaluation of his blood pressure but never went.  He was given a prescription for Norvasc 5 mg 1 daily with 11 refills which would have lasted until February 2022.  Patient states that he has been taking his blood medication every day but when told that he would have been out of medication in February he states he has not taken it as directed and has not found a PCP as previously instructed.  He denies any headache, dizziness, chest pain or shortness of breath.  Currently rates his pain as a 2 out of 10.      Past Medical History:  Diagnosis Date  . Arthritis   . Hypertension     There are no problems to display for this patient.   Past Surgical History:  Procedure Laterality Date  . ABDOMINAL SURGERY      Prior to Admission medications   Medication Sig Start Date End Date Taking? Authorizing Provider  amLODipine (NORVASC) 5 MG tablet Take 1 tablet (5 mg total) by mouth daily. 12/09/20 12/09/21 Yes Landa Mullinax L, PA-C  betamethasone valerate ointment (VALISONE) 0.1 % Apply 1 application topically 2 (two) times daily. 12/09/20  Yes Bridget Hartshorn L, PA-C  albuterol (PROVENTIL HFA;VENTOLIN  HFA) 108 (90 Base) MCG/ACT inhaler Inhale 2 puffs into the lungs every 6 (six) hours as needed for wheezing or shortness of breath. 11/15/17   Enid Derry, PA-C    Allergies Patient has no known allergies.  History reviewed. No pertinent family history.  Social History Social History   Tobacco Use  . Smoking status: Current Every Day Smoker    Packs/day: 1.00    Types: Cigarettes  . Smokeless tobacco: Never Used  Substance Use Topics  . Alcohol use: Yes    Review of Systems Constitutional: No fever/chills Eyes: No visual changes. ENT: No sore throat.  Lips "cracking". Cardiovascular: Denies chest pain. Respiratory: Denies shortness of breath. Gastrointestinal: No abdominal pain.  No nausea, no vomiting.  No diarrhea.  No constipation. Genitourinary: Negative for dysuria. Musculoskeletal: Negative for muscle aches.   Skin: Skin cracking around lips. Neurological: Negative for headaches, focal weakness or numbness. ____________________________________________   PHYSICAL EXAM:  VITAL SIGNS: ED Triage Vitals [12/09/20 0811]  Enc Vitals Group     BP      Pulse      Resp      Temp      Temp src      SpO2      Weight      Height      Head Circumference      Peak Flow      Pain Score 2     Pain  Loc      Pain Edu?      Excl. in GC?     Constitutional: Alert and oriented. Well appearing and in no acute distress. Eyes: Conjunctivae are normal.  Head: Atraumatic. Nose: No congestion/rhinnorhea. Mouth/Throat: Mucous membranes are moist.  Oropharynx non-erythematous.  Skin on lips Mcintosh upper and lower appear to be extremely dry and some areas of peeling.  No discoloration or pus is noted.  No vesicles present. Neck: No stridor.   Cardiovascular: Normal rate, regular rhythm. Grossly normal heart sounds.  Good peripheral circulation. Respiratory: Normal respiratory effort.  No retractions. Lungs CTAB. Gastrointestinal: Soft and nontender. No distention.   Musculoskeletal: His upper and lower extremities with any difficulty.  Normal gait was noted.  No edema noted lower extremities. Neurologic:  Normal speech and language. No gross focal neurologic deficits are appreciated. No gait instability. Skin:  Skin is warm, dry skin around the mouth as noted above. Psychiatric: Mood and affect are normal. Speech and behavior are normal.  ____________________________________________   LABS (all labs ordered are listed, but only abnormal results are displayed)  Labs Reviewed  BASIC METABOLIC PANEL - Abnormal; Notable for the following components:      Result Value   Potassium 3.3 (*)    Chloride 97 (*)    Glucose, Bld 168 (*)    All other components within normal limits  CBC WITH DIFFERENTIAL/PLATELET - Abnormal; Notable for the following components:   HCT 52.5 (*)    All other components within normal limits  URINALYSIS, COMPLETE (UACMP) WITH MICROSCOPIC - Abnormal; Notable for the following components:   Color, Urine STRAW (*)    APPearance CLEAR (*)    Specific Gravity, Urine 1.004 (*)    Hgb urine dipstick SMALL (*)    Bacteria, UA RARE (*)    All other components within normal limits     PROCEDURES  Procedure(s) performed (including Critical Care):  Procedures   ____________________________________________   INITIAL IMPRESSION / ASSESSMENT AND PLAN / ED COURSE  As part of my medical decision making, I reviewed the following data within the electronic MEDICAL RECORD NUMBER Notes from prior ED visits and [place] Controlled Substance Database   50 year old male presents to the ED with complaint of his lips feeling very dry and cracked.  She has been using Chapstick frequently without any relief.  He denies any allergies, edema or difficulty breathing or swallowing.  In looking through his chart patient has continued to have elevated blood pressure with days blood pressure being 163/113 and initially was 139/118.  His blood pressure each time  he has been in the emergency department has been the same.  He was seen in February 2001 at which time he was given a prescription for Norvasc 5 mg to take daily.  During that time he was supposed to be finding a PCP but states that he did not because the prescription was for 1 year.  He admits that he has not taken his medication as prescribed.  He also has not taken any medication last 3 days.  We discussed the damage that uncontrolled hypertension can cause.  Patient agrees to begin taking medication and also finding a PCP for management of his hypertension.  Also his reported fasting glucose today was 168.  There is a family history of diabetes.  And patient was made aware that his blood sugar is abnormally high.  This is another reason for him to obtain a PCP to have more tests done and  investigation as to whether he needs medication management.  A prescription for Norvasc 5 mg was given to the patient with 2 refills.  Information about clinics in the area was given to him however patient remembers that his PCP used to be Dr. Zada Finders.  Is encouraged to make an appointment with him and call on Monday for an appointment.  ____________________________________________   FINAL CLINICAL IMPRESSION(S) / ED DIAGNOSES  Final diagnoses:  Hypertension, uncontrolled  Exfoliative cheilitis  Medically noncompliant  Elevated glucose level     ED Discharge Orders         Ordered    betamethasone valerate ointment (VALISONE) 0.1 %  2 times daily        12/09/20 1036    amLODipine (NORVASC) 5 MG tablet  Daily        12/09/20 1036          *Please note:  Mark Mcintosh was evaluated in Emergency Department on 12/09/2020 for the symptoms described in the history of present illness. He was evaluated in the context of the global COVID-19 pandemic, which necessitated consideration that the patient might be at risk for infection with the SARS-CoV-2 virus that causes COVID-19. Institutional protocols and  algorithms that pertain to the evaluation of patients at risk for COVID-19 are in a state of rapid change based on information released by regulatory bodies including the CDC and federal and state organizations. These policies and algorithms were followed during the patient's care in the ED.  Some ED evaluations and interventions may be delayed as a result of limited staffing during and the pandemic.*   Note:  This document was prepared using Dragon voice recognition software and may include unintentional dictation errors.    Tommi Rumps, PA-C 12/09/20 1503    Delton Prairie, MD 12/09/20 [phone]

## 2021-02-09 ENCOUNTER — Emergency Department
Admission: EM | Admit: 2021-02-09 | Discharge: 2021-02-09 | Disposition: A | Discharge status: Home / Self Care | Payer: 59 | Attending: Emergency Medicine | Admitting: Emergency Medicine

## 2021-02-09 ENCOUNTER — Encounter: Payer: Self-pay | Admitting: Intensive Care

## 2021-02-09 ENCOUNTER — Other Ambulatory Visit: Payer: Self-pay

## 2021-02-09 DIAGNOSIS — I10 Essential (primary) hypertension: Secondary | ICD-10-CM | POA: Diagnosis not present

## 2021-02-09 DIAGNOSIS — K6289 Other specified diseases of anus and rectum: Secondary | ICD-10-CM | POA: Diagnosis present

## 2021-02-09 DIAGNOSIS — F17210 Nicotine dependence, cigarettes, uncomplicated: Secondary | ICD-10-CM | POA: Diagnosis not present

## 2021-02-09 DIAGNOSIS — K611 Rectal abscess: Secondary | ICD-10-CM | POA: Insufficient documentation

## 2021-02-09 DIAGNOSIS — Z79899 Other long term (current) drug therapy: Secondary | ICD-10-CM | POA: Diagnosis not present

## 2021-02-09 MED ORDER — OXYCODONE-ACETAMINOPHEN 5-325 MG PO TABS
1.0000 | ORAL_TABLET | ORAL | Status: DC | PRN
  Administered 2021-02-09: 1 via ORAL
  Filled 2021-02-09: qty 1

## 2021-02-09 MED ORDER — SULFAMETHOXAZOLE-TRIMETHOPRIM 800-160 MG PO TABS
1.0000 | ORAL_TABLET | Freq: Two times a day (BID) | ORAL | 0 refills | Status: DC
Start: 2021-02-09 — End: 2023-12-23

## 2021-02-09 MED ORDER — SULFAMETHOXAZOLE-TRIMETHOPRIM 800-160 MG PO TABS
1.0000 | ORAL_TABLET | Freq: Once | ORAL | Status: AC
  Administered 2021-02-09: 1 via ORAL
  Filled 2021-02-09: qty 1

## 2021-02-09 MED ORDER — OXYCODONE-ACETAMINOPHEN 5-325 MG PO TABS: 1.0000 | ORAL_TABLET | Freq: Four times a day (QID) | ORAL | 0 refills | Status: AC | PRN

## 2021-02-09 MED ORDER — OXYCODONE HCL 5 MG PO TABS
5.0000 mg | ORAL_TABLET | Freq: Once | ORAL | Status: AC
  Administered 2021-02-09: 5 mg via ORAL
  Filled 2021-02-09: qty 1

## 2021-02-09 NOTE — ED Triage Notes (Signed)
Patient c/o boil at rectum area. Denies drainage at this time. Reports swelling and pain

## 2021-02-09 NOTE — ED Notes (Signed)
follow up gen surg, rx info provided all questions answered

## 2021-02-09 NOTE — ED Provider Notes (Signed)
[facility] Emergency Department Provider Note ____________________________________________  Time seen: 2015  I have reviewed the triage vital signs and the nursing notes.  HISTORY  Chief Complaint  Recurrent Skin Infections   HPI Mark Mcintosh is a 50 y.o. male with below medical history, presents to the ED for evaluation of what he describes as a boil to his rectal area.  Patient denies any drainage at this time but does note pain and swelling.  He reports he had a similar area develop approximately 6 months prior.  At that time, he had significant bulging at the rectum which she thought was a hemorrhoid at that time.  He did consider at that time to local I&D procedure, and was discharged with antibiotics.  He reports resolution of his symptoms until about a week ago.  Patient denies any fever, chills, or sweats.  Also denies any spontaneous purulent drainage at this time.  Past Medical History:  Diagnosis Date   Arthritis    Hypertension     There are no problems to display for this patient.   Past Surgical History:  Procedure Laterality Date   ABDOMINAL SURGERY      Prior to Admission medications   Medication Sig Start Date End Date Taking? Authorizing Provider  oxyCODONE-acetaminophen (PERCOCET) 5-325 MG tablet Take 1 tablet by mouth every 6 (six) hours as needed for up to 5 days for severe pain. 02/09/21 02/14/21 Yes Letizia Hook, Charlesetta Ivory, PA-C  sulfamethoxazole-trimethoprim (BACTRIM DS) 800-160 MG tablet Take 1 tablet by mouth 2 (two) times daily. 02/09/21  Yes Xitlali Kastens, Charlesetta Ivory, PA-C  albuterol (PROVENTIL HFA;VENTOLIN HFA) 108 (90 Base) MCG/ACT inhaler Inhale 2 puffs into the lungs every 6 (six) hours as needed for wheezing or shortness of breath. 11/15/17   Enid Derry, PA-C  amLODipine (NORVASC) 5 MG tablet Take 1 tablet (5 mg total) by mouth daily. 12/09/20 12/09/21  Tommi Rumps, PA-C  betamethasone valerate ointment (VALISONE) 0.1  % Apply 1 application topically 2 (two) times daily. 12/09/20   Tommi Rumps, PA-C    Allergies Patient has no known allergies.  History reviewed. No pertinent family history.  Social History Social History   Tobacco Use   Smoking status: Every Day    Packs/day: 1.00    Pack years: 0.00    Types: Cigarettes   Smokeless tobacco: Never  Substance Use Topics   Alcohol use: Yes    Alcohol/week: 4.0 standard drinks    Types: 2 Cans of beer, 2 Shots of liquor per week   Drug use: Yes    Types: Marijuana    Review of Systems  Constitutional: Negative for fever. Eyes: Negative for visual changes. ENT: Negative for sore throat. Cardiovascular: Negative for chest pain. Respiratory: Negative for shortness of breath. Gastrointestinal: Negative for abdominal pain, vomiting and diarrhea.  Rectal pain as above. Genitourinary: Negative for dysuria. Musculoskeletal: Negative for back pain. Skin: Negative for rash. Neurological: Negative for headaches, focal weakness or numbness. ____________________________________________  PHYSICAL EXAM:  VITAL SIGNS: ED Triage Vitals [02/09/21 1833]  Enc Vitals Group     BP (!) 154/105     Pulse Rate (!) 109     Resp 16     Temp 98.9 F (37.2 C)     Temp Source Oral     SpO2 94 %     Weight 265 lb (120.2 kg)     Height 5\' 11"  (1.803 m)     Head Circumference  Peak Flow      Pain Score 6     Pain Loc      Pain Edu?      Excl. in GC?     Constitutional: Alert and oriented. Well appearing and in no distress. Head: Normocephalic and atraumatic. Eyes: Conjunctivae are normal. Normal extraocular movements Cardiovascular: Normal rate, regular rhythm. Normal distal pulses. Respiratory: Normal respiratory effort. No wheezes/rales/rhonchi. Gastrointestinal: Rectal exam reveals an area of tenderness to the 12 o'clock position at the rectal sphincter.  No obvious pointing, fluctuance, or abscess formation is appreciated on gross  exam. Musculoskeletal: Nontender with normal range of motion in all extremities.  Neurologic:  Normal gait without ataxia. Normal speech and language. No gross focal neurologic deficits are appreciated. Skin:  Skin is warm, dry and intact. No rash noted. ____________________________________________  PROCEDURES  Bactrim DS 1 p.o. Oxycodone-APAP 5-325 mg p.o. Roxicodone IR 5 mg p.o.  Procedures ____________________________________________   INITIAL IMPRESSION / ASSESSMENT AND PLAN / ED COURSE  As part of my medical decision making, I reviewed the following data within the electronic MEDICAL RECORD NUMBER Old chart reviewed, Notes from prior ED visits, and  [place] Controlled Substance Database   Pilonidal cyst abscess, perirectal abscess, thrombosed hemorrhoid  Patient ED evaluation of acute rectal pain, on presentation.  He was evaluated for his complaints, and was found to have a developing perirectal abscess.  No obvious pointing or fluctuance was noted.  We discussed the possibility of a bedside I&D procedure, but felt like the risk of further and intentional damage, or inability to accurately drain the abscess outweighed the benefit.  Patient was meaningful to the plan to start antibiotic course as well as pain medicine, and to follow closely with general surgery.  This is the second time patient has had such an abscess form.  He denies any fever, chills, or sweats.  He stable at this time for discharge and outpatient management.  He is advised to return to the ED if he does develop a spontaneously draining perirectal abscess.  Mark Mcintosh was evaluated in Emergency Department on 02/09/2021 for the symptoms described in the history of present illness. He was evaluated in the context of the global COVID-19 pandemic, which necessitated consideration that the patient might be at risk for infection with the SARS-CoV-2 virus that causes COVID-19. Institutional protocols and algorithms that pertain to  the evaluation of patients at risk for COVID-19 are in a state of rapid change based on information released by regulatory bodies including the CDC and federal and state organizations. These policies and algorithms were followed during the patient's care in the ED.  I reviewed the patient's prescription history over the last 12 months in the multi-state controlled substances database(s) that includes [place], [place], [place], [place], [place], [place], [place], [place], [place], [place], [place], [place], [place], and [place].  Results were notable for no current RX. ____________________________________________  FINAL CLINICAL IMPRESSION(S) / ED DIAGNOSES  Final diagnoses:  Perirectal abscess      Karmen Stabs, Charlesetta Ivory, PA-C 02/09/21 2337    Sharyn Creamer, MD 02/11/21 1549

## 2021-02-09 NOTE — Discharge Instructions (Addendum)
You have an early abscess forming to the perirectal region.  You should take antibiotics as prescribed, and the pain medicine as needed.  Be sure to follow-up with general surgery for definitive management, or return to the ED if you experience spontaneous drainage.

## 2021-04-04 ENCOUNTER — Ambulatory Visit (HOSPITAL_BASED_OUTPATIENT_CLINIC_OR_DEPARTMENT_OTHER): Payer: 59 | Admitting: Family Medicine

## 2021-04-13 ENCOUNTER — Encounter (HOSPITAL_BASED_OUTPATIENT_CLINIC_OR_DEPARTMENT_OTHER): Payer: Self-pay | Admitting: Family Medicine

## 2021-05-25 ENCOUNTER — Emergency Department
Admission: EM | Admit: 2021-05-25 | Discharge: 2021-05-25 | Discharge status: Against Medical Advice | Payer: 59 | Attending: Emergency Medicine | Admitting: Emergency Medicine

## 2021-05-25 ENCOUNTER — Emergency Department: Payer: 59

## 2021-05-25 ENCOUNTER — Other Ambulatory Visit: Payer: Self-pay

## 2021-05-25 DIAGNOSIS — L0211 Cutaneous abscess of neck: Secondary | ICD-10-CM | POA: Diagnosis not present

## 2021-05-25 DIAGNOSIS — Z79899 Other long term (current) drug therapy: Secondary | ICD-10-CM | POA: Diagnosis not present

## 2021-05-25 DIAGNOSIS — Z20822 Contact with and (suspected) exposure to covid-19: Secondary | ICD-10-CM | POA: Diagnosis not present

## 2021-05-25 DIAGNOSIS — J1089 Influenza due to other identified influenza virus with other manifestations: Secondary | ICD-10-CM | POA: Diagnosis not present

## 2021-05-25 DIAGNOSIS — I10 Essential (primary) hypertension: Secondary | ICD-10-CM | POA: Diagnosis not present

## 2021-05-25 DIAGNOSIS — R221 Localized swelling, mass and lump, neck: Secondary | ICD-10-CM

## 2021-05-25 DIAGNOSIS — F17210 Nicotine dependence, cigarettes, uncomplicated: Secondary | ICD-10-CM | POA: Insufficient documentation

## 2021-05-25 LAB — COMPREHENSIVE METABOLIC PANEL
ALT: 15 U/L (ref 0–44)
AST: 14 U/L — ABNORMAL LOW (ref 15–41)
Albumin: 3.8 g/dL (ref 3.5–5.0)
Alkaline Phosphatase: 62 U/L (ref 38–126)
Anion gap: 10 (ref 5–15)
BUN: 9 mg/dL (ref 6–20)
CO2: 35 mmol/L — ABNORMAL HIGH (ref 22–32)
Calcium: 9.1 mg/dL (ref 8.9–10.3)
Chloride: 97 mmol/L — ABNORMAL LOW (ref 98–111)
Creatinine, Ser: 0.88 mg/dL (ref 0.61–1.24)
GFR, Estimated: >60 mL/min (ref >60)
Glucose, Bld: 99 mg/dL (ref 70–99)
Potassium: 3.2 mmol/L — ABNORMAL LOW (ref 3.5–5.1)
Sodium: 142 mmol/L (ref 135–145)
Total Bilirubin: 0.6 mg/dL (ref 0.3–1.2)
Total Protein: 7.2 g/dL (ref 6.5–8.1)

## 2021-05-25 LAB — RESP PANEL BY RT-PCR (FLU A&B, COVID) ARPGX2
Influenza A by PCR: NEGATIVE
Influenza B by PCR: NEGATIVE
SARS Coronavirus 2 by RT PCR: NEGATIVE

## 2021-05-25 LAB — CBC WITH DIFFERENTIAL/PLATELET
Abs Immature Granulocytes: 0.05 10*3/uL (ref 0.00–0.07)
Basophils Absolute: 0 10*3/uL (ref 0.0–0.1)
Basophils Relative: 0 %
Eosinophils Absolute: 0.3 10*3/uL (ref 0.0–0.5)
Eosinophils Relative: 3 %
HCT: 49.7 % (ref 39.0–52.0)
Hemoglobin: 16.4 g/dL (ref 13.0–17.0)
Immature Granulocytes: 1 %
Lymphocytes Relative: 24 %
Lymphs Abs: 2 10*3/uL (ref 0.7–4.0)
MCH: 32.4 pg (ref 26.0–34.0)
MCHC: 33 g/dL (ref 30.0–36.0)
MCV: 98.2 fL (ref 80.0–100.0)
Monocytes Absolute: 0.8 10*3/uL (ref 0.1–1.0)
Monocytes Relative: 9 %
Neutro Abs: 5.5 10*3/uL (ref 1.7–7.7)
Neutrophils Relative %: 63 %
Platelets: 257 10*3/uL (ref 150–400)
RBC: 5.06 MIL/uL (ref 4.22–5.81)
RDW: 14.5 % (ref 11.5–15.5)
WBC: 8.6 10*3/uL (ref 4.0–10.5)
nRBC: 0 % (ref 0.0–0.2)

## 2021-05-25 MED ORDER — CLINDAMYCIN HCL 300 MG PO CAPS
300.0000 mg | ORAL_CAPSULE | Freq: Three times a day (TID) | ORAL | 0 refills | Status: AC
Start: 2021-05-25 — End: 2021-06-04

## 2021-05-25 MED ORDER — SODIUM CHLORIDE 0.9 % IV SOLN
1.0000 g | Freq: Once | INTRAVENOUS | Status: AC
  Administered 2021-05-25: 1 g via INTRAVENOUS
  Filled 2021-05-25: qty 10

## 2021-05-25 MED ORDER — PREDNISONE 20 MG PO TABS: 20.0000 mg | ORAL_TABLET | Freq: Every day | ORAL | 0 refills | Status: DC

## 2021-05-25 MED ORDER — DEXAMETHASONE SODIUM PHOSPHATE 10 MG/ML IJ SOLN
10.0000 mg | Freq: Once | INTRAMUSCULAR | Status: AC
  Administered 2021-05-25: 10 mg via INTRAVENOUS
  Filled 2021-05-25: qty 1

## 2021-05-25 MED ORDER — IOHEXOL 350 MG/ML SOLN
80.0000 mL | Freq: Once | INTRAVENOUS | Status: AC | PRN
  Administered 2021-05-25: 80 mL via INTRAVENOUS

## 2021-05-25 NOTE — ED Notes (Signed)
First Nurse Note:  Pt states that he woke up this morning with the left side of his neck swollen. Pt is in NAD at this time.

## 2021-05-25 NOTE — ED Notes (Signed)
Patient transported to CT 

## 2021-05-25 NOTE — ED Provider Notes (Addendum)
[facility] Emergency Department Provider Note   ____________________________________________   Event Date/Time   First MD Initiated Contact with Patient 05/25/21 1228     (approximate)  I have reviewed the triage vital signs and the nursing notes.   HISTORY  Chief Complaint Abscess   HPI Mark Mcintosh is a 50 y.o. male who comes in complaining of a tender lump in the left side of his throat.  Patient reports his voice is normal although it does sound muffled.  Patient has a history of having abscesses previously but not in his throat like this.  Masses firm, tender and about the size of a tennis ball.        Past Medical History:  Diagnosis Date   Arthritis    Hypertension     There are no problems to display for this patient.   Past Surgical History:  Procedure Laterality Date   ABDOMINAL SURGERY      Prior to Admission medications   Medication Sig Start Date End Date Taking? Authorizing Provider  clindamycin (CLEOCIN) 300 MG capsule Take 1 capsule (300 mg total) by mouth 3 (three) times daily for 10 days. 05/25/21 06/04/21 Yes Arnaldo Natal, MD  predniSONE (DELTASONE) 20 MG tablet Take 1 tablet (20 mg total) by mouth daily with breakfast. 05/25/21  Yes Arnaldo Natal, MD  albuterol (PROVENTIL HFA;VENTOLIN HFA) 108 (90 Base) MCG/ACT inhaler Inhale 2 puffs into the lungs every 6 (six) hours as needed for wheezing or shortness of breath. 11/15/17   Enid Derry, PA-C  amLODipine (NORVASC) 5 MG tablet Take 1 tablet (5 mg total) by mouth daily. 12/09/20 12/09/21  Tommi Rumps, PA-C  betamethasone valerate ointment (VALISONE) 0.1 % Apply 1 application topically 2 (two) times daily. 12/09/20   Tommi Rumps, PA-C  sulfamethoxazole-trimethoprim (BACTRIM DS) 800-160 MG tablet Take 1 tablet by mouth 2 (two) times daily. 02/09/21   Menshew, Charlesetta Ivory, PA-C    Allergies Patient has no known allergies.  History reviewed. No pertinent  family history.  Social History Social History   Tobacco Use   Smoking status: Every Day    Packs/day: 1.00    Types: Cigarettes   Smokeless tobacco: Never  Substance Use Topics   Alcohol use: Yes    Alcohol/week: 4.0 standard drinks    Types: 2 Cans of beer, 2 Shots of liquor per week   Drug use: Yes    Types: Marijuana    Review of Systems  Constitutional: No fever/chills Eyes: No visual changes. ENT: No sore throat!! Cardiovascular: Denies chest pain. Respiratory: Denies shortness of breath. Gastrointestinal: No abdominal pain.  No nausea, no vomiting.  No diarrhea.  No constipation. Genitourinary: Negative for dysuria. Musculoskeletal: Negative for back pain. Skin: Negative for rash. Neurological: Negative for headaches, focal weakness   ____________________________________________   PHYSICAL EXAM:  VITAL SIGNS: ED Triage Vitals  Enc Vitals Group     BP 05/25/21 1211 (!) 168/113     Pulse Rate 05/25/21 1211 98     Resp 05/25/21 1211 18     Temp 05/25/21 1211 98.3 F (36.8 C)     Temp Source 05/25/21 1211 Oral     SpO2 05/25/21 1211 92 %     Weight 05/25/21 1212 260 lb (117.9 kg)     Height 05/25/21 1212 5\' 11"  (1.803 m)     Head Circumference --      Peak Flow --      Pain Score 05/25/21  1212 4     Pain Loc --      Pain Edu? --      Excl. in GC? --     Constitutional: Alert and oriented. Well appearing and in no acute distress. Eyes: Conjunctivae are normal. PER Head: Atraumatic. Nose: No congestion/rhinnorhea. Mouth/Throat: Mucous membranes are moist.  Oropharynx non-erythematous. Neck: No stridor.   Hematological/Lymphatic/Immunilogical: Large tender mass left angle of the jaw under the jaw lymphadenopathy. Cardiovascular: Normal rate, regular rhythm. Grossly normal heart sounds.  Good peripheral circulation. Respiratory: Normal respiratory effort.  No retractions. Lungs CTAB. Gastrointestinal: Soft and nontender. No distention. No abdominal  bruits.  Musculoskeletal: No lower extremity tenderness nor edema.  No joint effusions. Neurologic:  Normal speech and language. No gross focal neurologic deficits are appreciated. No gait instability. Skin:  Skin is warm, dry and intact. No rash noted.  ____________________________________________   LABS (all labs ordered are listed, but only abnormal results are displayed)  Labs Reviewed  COMPREHENSIVE METABOLIC PANEL - Abnormal; Notable for the following components:      Result Value   Potassium 3.2 (*)    Chloride 97 (*)    CO2 35 (*)    AST 14 (*)    All other components within normal limits  RESP PANEL BY RT-PCR (FLU A&B, COVID) [id number]  CBC WITH DIFFERENTIAL/PLATELET  CBC WITH DIFFERENTIAL/PLATELET   ____________________________________________  EKG   ____________________________________________  RADIOLOGY Jill Poling, personally viewed and evaluated these images (plain radiographs) as part of my medical decision making, as well as reviewing the written report by the radiologist.  ED MD interpretation:    Official radiology report(s): CT Soft Tissue Neck W Contrast  Result Date: 05/25/2021 CLINICAL DATA:  Lymphadenopathy, swollen mass left side of neck and angle of the jaw EXAM: CT NECK WITH CONTRAST TECHNIQUE: Multidetector CT imaging of the neck was performed using the standard protocol following the bolus administration of intravenous contrast. CONTRAST:  7mL OMNIPAQUE IOHEXOL 350 MG/ML SOLN COMPARISON:  None. FINDINGS: Evaluation is somewhat limited by body habitus and motion. Pharynx and larynx: Generalized swelling in the oropharynx and hypopharynx, without focal mass or low-density collection. Salivary glands: Mild fat stranding at the lateral aspect of the left submandibular gland, which does not appear to involve the gland itself. The parotid and right submandibular glands are unremarkable. No mass or stone. Thyroid: Normal. Lymph nodes: None enlarged or  abnormal density. Vascular: Negative. Limited intracranial: Negative. Visualized orbits: Negative. Mastoids and visualized paranasal sinuses: Clear. Skeleton: Evaluation of the spine is somewhat limited by motion artifact and body habitus. Within this limitation, no acute or aggressive process. Upper chest: Negative. Other: In the left neck, overlying the left submandibular gland, there is fat stranding and mild edema in the subcutaneous soft tissues, superficial to and subjacent to the platysma. No focal collection. IMPRESSION: 1. Evaluation is somewhat limited by motion artifact and body habitus. Within this limitation, there is nonspecific, generalized swelling in the oropharynx and hypopharynx, without focal mass or low-density collection. This may represent tonsillitis. Consider direct visualization. 2. Fat stranding and mild edema in the subcutaneous soft tissues of the left neck, superficial to and subjacent to platysma, without focal collection or connection to the left submandibular gland, which is otherwise normal. Correlate for symptoms of cellulitis. Electronically Signed   By: Wiliam Ke M.D.   On: 05/25/2021 15:22    ____________________________________________   PROCEDURES  Procedure(s) performed (including Critical Care):  Procedures   ____________________________________________   INITIAL  IMPRESSION / ASSESSMENT AND PLAN / ED COURSE  ----------------------------------------- 4:09 PM on 05/25/2021 ----------------------------------------- Spoke with Dr. Andee Poles and read him the CT scan.  He is going to come in and see the patient.  He will see if the patient needs scoping.  The patient does have a beard but there is no dense beard in the area of the or swelling it does not look like there is a skin cellulitis going on.  Patient again insists that his voice is normal for him.  It still sounds muffled.  He does not have any swelling around his teeth had him take his dentures out  look again.  We will give him some clindamycin and prednisone here and if he goes home as he wishes he will get some more.  He will return if he gets at all worsen call 911 if he is short of breath.  Dr. Derrill Kay will watch the patient until Dr. Andee Poles gets here.              ____________________________________________   FINAL CLINICAL IMPRESSION(S) / ED DIAGNOSES  Final diagnoses:  Swollen neck     ED Discharge Orders          Ordered    clindamycin (CLEOCIN) 300 MG capsule  3 times daily        05/25/21 1605    predniSONE (DELTASONE) 20 MG tablet  Daily with breakfast        05/25/21 1605             Note:  This document was prepared using Dragon voice recognition software and may include unintentional dictation errors.    Arnaldo Natal, MD 05/25/21 1610    Arnaldo Natal, MD 05/25/21 [phone]

## 2021-05-25 NOTE — Consult Note (Signed)
Jahmire, Ruffins [id number] [birth year] No att. providers found  Reason for Consult: neck swelling  HPI: 50 y.o. obese male presents with one day history of neck swelling.  Initially he denies sore throat but does report after questioning that it hurts some.  No sick contacts.  Working excessively recently. He reports his fiance says he snores badly.  No fevers.  No breathing difficulty.  He does report left neck tenderness and pain.  CT scan was done earlier.  Allergies: No Known Allergies  ROS: Review of systems normal other than 12 systems except per HPI.  PMH:  Past Medical History:  Diagnosis Date   Arthritis    Hypertension     FH: History reviewed. No pertinent family history.  SH:  Social History   Socioeconomic History   Marital status: Single    Spouse name: Not on file   Number of children: Not on file   Years of education: Not on file   Highest education level: Not on file  Occupational History   Not on file  Tobacco Use   Smoking status: Every Day    Packs/day: 1.00    Types: Cigarettes   Smokeless tobacco: Never  Substance and Sexual Activity   Alcohol use: Yes    Alcohol/week: 4.0 standard drinks    Types: 2 Cans of beer, 2 Shots of liquor per week   Drug use: Yes    Types: Marijuana   Sexual activity: Not on file  Other Topics Concern   Not on file  Social History Narrative   Not on file   Social Determinants of Health   Financial Resource Strain: Not on file  Food Insecurity: Not on file  Transportation Needs: Not on file  Physical Activity: Not on file  Stress: Not on file  Social Connections: Not on file  Intimate Partner Violence: Not on file    PSH:  Past Surgical History:  Procedure Laterality Date   ABDOMINAL SURGERY      Physical  Exam:  GEN-  Supine in bed sleeping, mild hyponasal voice NEURO- CN 2-12 grossly intact and symmetric. EARS- ears clear bilaterally NOSE-  mild edema and erythema and crusting and purulence on  left OC/OP-  mallampati IV, no masses or lesions see or palpated. NECK- submental and left submandibular fullness and tenderness with palpation, no flucutance. RESP- unlabored  Procedure:  Transnasal flexible laryngoscopy-  After verbal consent was obtained, the patient's left nasal cavity was anesthetized with gen-nasal and lidocaine.  A flexible laryngoscope was inserted into the patient's left nasal cavity for evaluation.  This demonstrated diffused erythema and edema of nasal mucosa and nasopharynx and mild purulent drainage.  Diffuse erythema and edema of pharynx and base of tongue extending to valleculae and left hypopharynx with mild mass effect on epiglottis.  Normal epiglottis and normal larynx.  Exudate on lingual tonsils.   A/P: Lingual tonsillitis.  Despite the fat stranding appearing to be superficial, on exam I think the fullness is more coming from his left lingual tonsillitis and diffuse edema.  Patient already got Ceftiraxone in ER and I recommend Decadron 10mg .  Discussed with patient that I recommend he stay and be admitted for IV antibiotics and steroids but he is adamant regarding leaving.  Discussed that if he leaves, I would recommend Clindamycin 300mg  QID x 10 days and Sterapred DS 6 day taper and then follow up with me Monday morning.  Discussed with patient that if swelling and infection worsen, that he may be at  risk of airway compromise.  If patient decides to stay, will continue to follow while admitted.   Zakiyyah Savannah 05/25/2021 5:02 PM

## 2021-05-25 NOTE — ED Notes (Signed)
IV team at bedside 

## 2021-05-25 NOTE — ED Notes (Signed)
ENT DR Andee Poles GAVE VERBAL ORDER TO GIVE PT 10 MG IVP DECADRON

## 2021-05-25 NOTE — ED Triage Notes (Addendum)
Pt comes pov with possible dental/throat abscess. Hx of abscesses in other places but this morning woke up with scratchy throat and swelling in left side of throat and mouth. Noticeable abscess on left throat. Pt muffled voice and has to clear throat often. Sats not above 92% in triage.

## 2021-05-25 NOTE — Discharge Instructions (Addendum)
Please take the clindamycin 1 pill 3 times a day.  This is an antibiotic and should help with any infection going on.  Please take the prednisone 1 pill once a day.  I have given you doses of each of these for tonight.  Start your medicines in the morning.  Please return for any fever or increasing swelling or difficulty swallowing-that is getting things to go down.  If it is a little bit painful lets okay.  Please call 911 and return immediately if you have any trouble breathing.  Please follow-up with primary care or see Dr. Andee Poles in his office depending on what he tells you when he sees you.

## 2021-05-25 NOTE — ED Notes (Signed)
Pt returned from CT at this time.  

## 2021-05-28 ENCOUNTER — Encounter: Payer: Self-pay | Admitting: Otolaryngology

## 2021-12-26 ENCOUNTER — Other Ambulatory Visit: Payer: Self-pay

## 2021-12-26 ENCOUNTER — Emergency Department
Admission: EM | Admit: 2021-12-26 | Discharge: 2021-12-26 | Disposition: A | Discharge status: Home / Self Care | Payer: Self-pay | Attending: Emergency Medicine | Admitting: Emergency Medicine

## 2021-12-26 ENCOUNTER — Encounter: Payer: Self-pay | Admitting: Emergency Medicine

## 2021-12-26 DIAGNOSIS — Z79899 Other long term (current) drug therapy: Secondary | ICD-10-CM | POA: Insufficient documentation

## 2021-12-26 DIAGNOSIS — I10 Essential (primary) hypertension: Secondary | ICD-10-CM | POA: Insufficient documentation

## 2021-12-26 DIAGNOSIS — Z760 Encounter for issue of repeat prescription: Secondary | ICD-10-CM | POA: Insufficient documentation

## 2021-12-26 MED ORDER — AMLODIPINE BESYLATE 5 MG PO TABS: 5.0000 mg | ORAL_TABLET | Freq: Every day | ORAL | 6 refills | Status: DC

## 2021-12-26 NOTE — ED Notes (Signed)
Pt states that he ran out of his amlodipine in feb, states that his head was bothering him at work this am, so his manager checked his bp and found it to be elevated and pt was told to come for an eval and that he couldn't work like that. ?

## 2021-12-26 NOTE — ED Triage Notes (Signed)
C/O HTN.  OUt of meds since January.  Was taking amlodipine 5 mg.  Patient states he needs a new PCP. ?

## 2021-12-26 NOTE — ED Provider Notes (Signed)
? ?[facility] ?Provider Note ? ? ? Event Date/Time  ? First MD Initiated Contact with Patient 12/26/21 1335   ?  (approximate) ? ? ?History  ? ?Hypertension ? ? ?HPI ? ?Mark Mcintosh is a 51 y.o. male presents emergency department in need of a medication refill for his hypertension.  Patient was at work and got a headache noticed his blood pressure was high.  His boss told him to go get his medication refilled and returned to work after he had gotten medication.  He denies chest pain, shortness of breath.  No blurred vision.  No headache at this time.  No numbness or tingling.  Patient normally takes amlodipine 5 mg ? ?  ? ? ?Physical Exam  ? ?Triage Vital Signs: ?ED Triage Vitals [12/26/21 1203]  ?Enc Vitals Group  ?   BP (!) 149/114  ?   Pulse Rate 94  ?   Resp 16  ?   Temp 98.4 ?F (36.9 ?C)  ?   Temp Source Oral  ?   SpO2 97 %  ?   Weight 259 lb 14.8 oz (117.9 kg)  ?   Height 5\' 11"  (1.803 m)  ?   Head Circumference   ?   Peak Flow   ?   Pain Score 0  ?   Pain Loc   ?   Pain Edu?   ?   Excl. in GC?   ? ? ?Most recent vital signs: ?Vitals:  ? 12/26/21 1203 12/26/21 1204  ?BP: (!) 149/114 (!) 149/114  ?Pulse: 94 90  ?Resp: 16 19  ?Temp: 98.4 ?F (36.9 ?C) 98.4 ?F (36.9 ?C)  ?SpO2: 97% 93%  ? ? ? ?General: Awake, no distress.   ?CV:  Good peripheral perfusion. regular rate and  rhythm ?Resp:  Normal effort.  ?Abd:  No distention.   ?Other:   ? ? ?ED Results / Procedures / Treatments  ? ?Labs ?(all labs ordered are listed, but only abnormal results are displayed) ?Labs Reviewed - No data to display ? ? ?EKG ? ? ? ? ?RADIOLOGY ? ? ? ? ?PROCEDURES: ? ? ?Procedures ? ? ?MEDICATIONS ORDERED IN ED: ?Medications - No data to display ? ? ?IMPRESSION / MDM / ASSESSMENT AND PLAN / ED COURSE  ?I reviewed the triage vital signs and the nursing notes. ?             ?               ? ?Differential diagnosis includes, but is not limited to, essential hypertension, medication refill, CVA, kidney  disease ? ?Patient's past medical history is consistent with essential hypertension.  He will be given his prescription for amlodipine.  His blood pressure on arrival was 149/114.  It is now decreased to 139/80 while resting.  I do not feel that the patient's had a stroke.  He is to follow-up with an regular doctor as needed.  He was information for multiple clinics in the area that work at a discounted rate he states he does not have insurance.  He is in agreement treatment plan.  I do not feel that he has had a CVA and kidney disease he has no history of.  He was discharged in stable condition. ? ? ? ? ?  ? ? ?FINAL CLINICAL IMPRESSION(S) / ED DIAGNOSES  ? ?Final diagnoses:  ?Hypertension, unspecified type  ? ? ? ?Rx / DC Orders  ? ?ED Discharge Orders   ? ?  Ordered  ?  amLODipine (NORVASC) 5 MG tablet  Daily       ? 12/26/21 1343  ? ?  ?  ? ?  ? ? ? ?Note:  This document was prepared using Dragon voice recognition software and may include unintentional dictation errors. ? ?  ?Faythe Ghee, PA-C ?12/26/21 1349 ? ?  ?Minna Antis, MD ?12/26/21 1502 ? ?

## 2022-03-06 ENCOUNTER — Emergency Department: Payer: Self-pay

## 2022-03-06 ENCOUNTER — Emergency Department
Admission: EM | Admit: 2022-03-06 | Discharge: 2022-03-06 | Disposition: A | Discharge status: Home / Self Care | Payer: Self-pay | Attending: Emergency Medicine | Admitting: Emergency Medicine

## 2022-03-06 ENCOUNTER — Other Ambulatory Visit: Payer: Self-pay

## 2022-03-06 DIAGNOSIS — Z20822 Contact with and (suspected) exposure to covid-19: Secondary | ICD-10-CM | POA: Insufficient documentation

## 2022-03-06 DIAGNOSIS — J40 Bronchitis, not specified as acute or chronic: Secondary | ICD-10-CM | POA: Insufficient documentation

## 2022-03-06 LAB — CBC
HCT: 51.5 % (ref 39.0–52.0)
Hemoglobin: 16.5 g/dL (ref 13.0–17.0)
MCH: 31.1 pg (ref 26.0–34.0)
MCHC: 32 g/dL (ref 30.0–36.0)
MCV: 97 fL (ref 80.0–100.0)
Platelets: 260 10*3/uL (ref 150–400)
RBC: 5.31 MIL/uL (ref 4.22–5.81)
RDW: 14.4 % (ref 11.5–15.5)
WBC: 5.8 10*3/uL (ref 4.0–10.5)
nRBC: 0 % (ref 0.0–0.2)

## 2022-03-06 LAB — LACTIC ACID, PLASMA
Lactic Acid, Venous: 1.2 mmol/L (ref 0.5–1.9)
Lactic Acid, Venous: 1.4 mmol/L (ref 0.5–1.9)

## 2022-03-06 LAB — BASIC METABOLIC PANEL
Anion gap: 5 (ref 5–15)
BUN: 12 mg/dL (ref 6–20)
CO2: 32 mmol/L (ref 22–32)
Calcium: 8.9 mg/dL (ref 8.9–10.3)
Chloride: 104 mmol/L (ref 98–111)
Creatinine, Ser: 0.89 mg/dL (ref 0.61–1.24)
GFR, Estimated: >60 mL/min (ref >60)
Glucose, Bld: 113 mg/dL — ABNORMAL HIGH (ref 70–99)
Potassium: 4.3 mmol/L (ref 3.5–5.1)
Sodium: 141 mmol/L (ref 135–145)

## 2022-03-06 LAB — BRAIN NATRIURETIC PEPTIDE: B Natriuretic Peptide: 21.5 pg/mL (ref 0.0–100.0)

## 2022-03-06 LAB — TROPONIN I (HIGH SENSITIVITY)
Troponin I (High Sensitivity): 8 ng/L (ref <18)
Troponin I (High Sensitivity): 7 ng/L (ref <18)

## 2022-03-06 LAB — SARS CORONAVIRUS 2 BY RT PCR: SARS Coronavirus 2 by RT PCR: NEGATIVE

## 2022-03-06 MED ORDER — IPRATROPIUM-ALBUTEROL 0.5-2.5 (3) MG/3ML IN SOLN
3.0000 mL | Freq: Once | RESPIRATORY_TRACT | Status: AC
  Administered 2022-03-06: 3 mL via RESPIRATORY_TRACT
  Filled 2022-03-06: qty 3

## 2022-03-06 MED ORDER — PREDNISONE 20 MG PO TABS
60.0000 mg | ORAL_TABLET | Freq: Once | ORAL | Status: AC
  Administered 2022-03-06: 60 mg via ORAL
  Filled 2022-03-06: qty 3

## 2022-03-06 MED ORDER — PREDNISONE 10 MG PO TABS: ORAL_TABLET | ORAL | 0 refills | Status: DC

## 2022-03-06 MED ORDER — AZITHROMYCIN 500 MG PO TABS
500.0000 mg | ORAL_TABLET | Freq: Once | ORAL | Status: AC
  Administered 2022-03-06: 500 mg via ORAL
  Filled 2022-03-06: qty 1

## 2022-03-06 MED ORDER — ALBUTEROL SULFATE HFA 108 (90 BASE) MCG/ACT IN AERS: INHALATION_SPRAY | RESPIRATORY_TRACT | 2 refills | Status: DC

## 2022-03-06 MED ORDER — AZITHROMYCIN 250 MG PO TABS: ORAL_TABLET | ORAL | 0 refills | Status: AC

## 2022-03-06 NOTE — ED Triage Notes (Signed)
Pt here with SOB and body aches that started this morning. Pt states he woke up to use the bathroom this morning and could not catch his breath. Pt denies fever or congestion. Pt ambulatory to triage.

## 2022-03-06 NOTE — ED Provider Notes (Signed)
[facility] Provider Note    Event Date/Time   First MD Initiated Contact with Patient 03/06/22 [phone]     (approximate)   History   Shortness of Breath and Generalized Body Aches   HPI  Mark Mcintosh is a 51 y.o. male 50 year old male who reports he was fine when he went to bed last night woke up this morning aching all over and short of breath.  He has a mild wet sounding cough now.  He is not running a fever.  Does not have any particular chest pain just aching all over.      Physical Exam   Triage Vital Signs: ED Triage Vitals [03/06/22 0736]  Enc Vitals Group     BP (!) 167/118     Pulse Rate 85     Resp 18     Temp 97.9 F (36.6 C)     Temp Source Oral     SpO2 95 %     Weight 259 lb 14.8 oz (117.9 kg)     Height 5\' 11"  (1.803 m)     Head Circumference      Peak Flow      Pain Score 0     Pain Loc      Pain Edu?      Excl. in GC?     Most recent vital signs: Vitals:   03/06/22 1030 03/06/22 1100  BP: (!) 131/97 118/88  Pulse: 90 80  Resp: 18 18  Temp:  98 F (36.7 C)  SpO2: 96% 96%     General: Awake, no marked distress.  CV:  Good peripheral perfusion.  Heart regular rate and rhythm no audible murmurs Resp:  Normal effort.  Lungs with diffuse wheezes Abd:  No distention.  Soft and nontender Extremities with no edema  Labs (all labs ordered are listed, but only abnormal results are displayed) Labs Reviewed  BASIC METABOLIC PANEL - Abnormal; Notable for the following components:      Result Value   Glucose, Bld 113 (*)    All other components within normal limits  SARS CORONAVIRUS 2 BY RT PCR  CBC  BRAIN NATRIURETIC PEPTIDE  LACTIC ACID, PLASMA  LACTIC ACID, PLASMA  DIFFERENTIAL  TROPONIN I (HIGH SENSITIVITY)  TROPONIN I (HIGH SENSITIVITY)     EKG  EKG read and interpreted by me shows normal sinus rhythm rate of 90 extreme right axis no acute ST-T wave changes decreased R wave progression.  Similar to EKG  from May of this year   RADIOLOGY Chest x-ray read by radiology reviewed and interpreted by me does not show any obvious focal infiltrate or pneumothorax   PROCEDURES:  Critical Care performed:   Procedures   MEDICATIONS ORDERED IN ED: Medications  ipratropium-albuterol (DUONEB) 0.5-2.5 (3) MG/3ML nebulizer solution 3 mL (3 mLs Nebulization Given 03/06/22 0821)  ipratropium-albuterol (DUONEB) 0.5-2.5 (3) MG/3ML nebulizer solution 3 mL (3 mLs Nebulization Given 03/06/22 1149)  predniSONE (DELTASONE) tablet 60 mg (60 mg Oral Given 03/06/22 1149)  azithromycin (ZITHROMAX) tablet 500 mg (500 mg Oral Given 03/06/22 1149)     IMPRESSION / MDM / ASSESSMENT AND PLAN / ED COURSE  I reviewed the triage vital signs and the nursing notes. Patient better after nebulizer treatment.  He is wanting to go home.  I will let him go.  We will give him a day or 2 off because he is worried about the dusty environment of his work and also he could possibly be  contagious.  He does not appear to have a pneumonia.  He is symptoms do not fit with pneumonitis troponin EKG are negative there is no sign of CHF I think he just has a bronchitis.  Coronavirus is negative.  Patient's presentation is most consistent with acute complicated illness / injury requiring diagnostic workup.  he patient is on the cardiac monitor to evaluate for evidence of arrhythmia and/or significant heart rate changes.  None were seen       FINAL CLINICAL IMPRESSION(S) / ED DIAGNOSES   Final diagnoses:  Bronchitis     Rx / DC Orders   ED Discharge Orders          Ordered    azithromycin (ZITHROMAX Z-PAK) 250 MG tablet        03/06/22 1212    albuterol (VENTOLIN HFA) 108 (90 Base) MCG/ACT inhaler        03/06/22 1212    predniSONE (DELTASONE) 10 MG tablet        03/06/22 1212             Note:  This document was prepared using Dragon voice recognition software and may include unintentional dictation errors.    Arnaldo Natal, MD 03/06/22 1215

## 2022-03-06 NOTE — Discharge Instructions (Addendum)
Please return for increasing shortness of breath or fever or any other problems.  Use the inhaler and spacer 2 puffs 4 times a day.  You can puff the inhaler into the spacer and then just breathe through the spacer.  Take the Zithromax as directed start that tomorrow the 20th.  2 pills on the first day and then 1 every day after that till they are gone.  Take the prednisone starting tomorrow on the 20th as well.  That is 2 pills once a day.  You can take that with food.  Please follow-up with your regular doctor in the next several days to make sure you are doing well.

## 2023-02-26 ENCOUNTER — Encounter: Payer: Self-pay | Admitting: *Deleted

## 2023-02-26 ENCOUNTER — Other Ambulatory Visit: Payer: Self-pay

## 2023-02-26 ENCOUNTER — Emergency Department
Admission: EM | Admit: 2023-02-26 | Discharge: 2023-02-26 | Disposition: A | Discharge status: Home / Self Care | Payer: 59 | Attending: Emergency Medicine | Admitting: Emergency Medicine

## 2023-02-26 ENCOUNTER — Emergency Department: Payer: 59

## 2023-02-26 DIAGNOSIS — Z760 Encounter for issue of repeat prescription: Secondary | ICD-10-CM | POA: Diagnosis not present

## 2023-02-26 DIAGNOSIS — I10 Essential (primary) hypertension: Secondary | ICD-10-CM | POA: Diagnosis not present

## 2023-02-26 DIAGNOSIS — R519 Headache, unspecified: Secondary | ICD-10-CM | POA: Diagnosis present

## 2023-02-26 LAB — COMPREHENSIVE METABOLIC PANEL
ALT: 15 U/L (ref 0–44)
AST: 17 U/L (ref 15–41)
Albumin: 3.8 g/dL (ref 3.5–5.0)
Alkaline Phosphatase: 63 U/L (ref 38–126)
Anion gap: 11 (ref 5–15)
BUN: 14 mg/dL (ref 6–20)
CO2: 26 mmol/L (ref 22–32)
Calcium: 8.7 mg/dL — ABNORMAL LOW (ref 8.9–10.3)
Chloride: 104 mmol/L (ref 98–111)
Creatinine, Ser: 0.98 mg/dL (ref 0.61–1.24)
GFR, Estimated: >60 mL/min (ref >60)
Glucose, Bld: 101 mg/dL — ABNORMAL HIGH (ref 70–99)
Potassium: 3.7 mmol/L (ref 3.5–5.1)
Sodium: 141 mmol/L (ref 135–145)
Total Bilirubin: 0.7 mg/dL (ref 0.3–1.2)
Total Protein: 7.4 g/dL (ref 6.5–8.1)

## 2023-02-26 LAB — CBC WITH DIFFERENTIAL/PLATELET
Abs Immature Granulocytes: 0.05 10*3/uL (ref 0.00–0.07)
Basophils Absolute: 0 10*3/uL (ref 0.0–0.1)
Basophils Relative: 0 %
Eosinophils Absolute: 0.3 10*3/uL (ref 0.0–0.5)
Eosinophils Relative: 4 %
HCT: 48.6 % (ref 39.0–52.0)
Hemoglobin: 15.8 g/dL (ref 13.0–17.0)
Immature Granulocytes: 1 %
Lymphocytes Relative: 21 %
Lymphs Abs: 1.6 10*3/uL (ref 0.7–4.0)
MCH: 31.1 pg (ref 26.0–34.0)
MCHC: 32.5 g/dL (ref 30.0–36.0)
MCV: 95.7 fL (ref 80.0–100.0)
Monocytes Absolute: 0.7 10*3/uL (ref 0.1–1.0)
Monocytes Relative: 10 %
Neutro Abs: 4.8 10*3/uL (ref 1.7–7.7)
Neutrophils Relative %: 64 %
Platelets: 229 10*3/uL (ref 150–400)
RBC: 5.08 MIL/uL (ref 4.22–5.81)
RDW: 14.9 % (ref 11.5–15.5)
WBC: 7.4 10*3/uL (ref 4.0–10.5)
nRBC: 0 % (ref 0.0–0.2)

## 2023-02-26 MED ORDER — AMLODIPINE BESYLATE 5 MG PO TABS: 5.0000 mg | ORAL_TABLET | Freq: Every day | ORAL | 3 refills | Status: DC

## 2023-02-26 MED ORDER — AMLODIPINE BESYLATE 5 MG PO TABS
5.0000 mg | ORAL_TABLET | Freq: Once | ORAL | Status: AC
  Administered 2023-02-26: 5 mg via ORAL
  Filled 2023-02-26: qty 1

## 2023-02-26 NOTE — Discharge Instructions (Signed)
Take Amlodipine once daily.

## 2023-02-26 NOTE — ED Triage Notes (Signed)
Pt out of bp meds for 3 weeks and needs a refill.  Pt has a headache since yesterday.  No n/v  pt alert  speech clear.  Pt ambulates without diff.

## 2023-02-26 NOTE — ED Provider Notes (Signed)
[facility] Provider Note  Patient Contact: 8:43 PM (approximate)   History   Medication Refill   HPI  Mark Mcintosh is a 52 y.o. male with a history of hypertension, presents to the emergency department with headaches that have occurred intermittently over the past 3 to 4 days.  Patient does report that he works out in the very hot heat.  He states that he has been out of his blood pressure medication for the past 3 weeks.  He denies any other medication changes.  No fever or chills.  No chest pain or abdominal pain.  He is not currently established with a PCP.      Physical Exam   Triage Vital Signs: ED Triage Vitals  Enc Vitals Group     BP 02/26/23 1945 (!) 143/103     Pulse Rate 02/26/23 1945 93     Resp 02/26/23 1945 20     Temp 02/26/23 1945 98.8 F (37.1 C)     Temp Source 02/26/23 1945 Oral     SpO2 02/26/23 1945 100 %     Weight 02/26/23 1942 265 lb (120.2 kg)     Height 02/26/23 1942 5\' 11"  (1.803 m)     Head Circumference --      Peak Flow --      Pain Score 02/26/23 1942 7     Pain Loc --      Pain Edu? --      Excl. in GC? --     Most recent vital signs: Vitals:   02/26/23 1945  BP: (!) 143/103  Pulse: 93  Resp: 20  Temp: 98.8 F (37.1 C)  SpO2: 100%     General: Alert and in no acute distress. Eyes:  PERRL. EOMI. Head: No acute traumatic findings ENT:      Nose: No congestion/rhinnorhea.      Mouth/Throat: Mucous membranes are moist.  Neck: No stridor. No cervical spine tenderness to palpation. Cardiovascular:  Good peripheral perfusion Respiratory: Normal respiratory effort without tachypnea or retractions. Lungs CTAB. Good air entry to the bases with no decreased or absent breath sounds. Gastrointestinal: Bowel sounds 4 quadrants. Soft and nontender to palpation. No guarding or rigidity. No palpable masses. No distention. No CVA tenderness. Musculoskeletal: Full range of motion to all extremities.   Neurologic:  No gross focal neurologic deficits are appreciated.  Skin:   No rash noted    ED Results / Procedures / Treatments   Labs (all labs ordered are listed, but only abnormal results are displayed) Labs Reviewed  COMPREHENSIVE METABOLIC PANEL - Abnormal; Notable for the following components:      Result Value   Glucose, Bld 101 (*)    Calcium 8.7 (*)    All other components within normal limits  CBC WITH DIFFERENTIAL/PLATELET        RADIOLOGY  I personally viewed and evaluated these images as part of my medical decision making, as well as reviewing the written report by the radiologist.  ED Provider Interpretation: CT head shows no acute abnormality.   PROCEDURES:  Critical Care performed: No  Procedures   MEDICATIONS ORDERED IN ED: Medications  amLODipine (NORVASC) tablet 5 mg (5 mg Oral Given 02/26/23 2052)     IMPRESSION / MDM / ASSESSMENT AND PLAN / ED COURSE  I reviewed the triage vital signs and the nursing notes.  Assessment and plan: Headache:  52 year old male presents to the emergency department with a mild intermittent headache over the past 2 to 3 days.  Patient was mildly hypertensive at triage but vital signs were otherwise reassuring.  On exam, patient was alert and nontoxic-appearing with no apparent neurodeficits.  CT head showed no evidence of intracranial bleed.  CBC and CMP reassuring.  Patient was given his first dose of amlodipine while in the emergency department and discharged with amlodipine.  Return precautions were given to return with new or worsening symptoms.     FINAL CLINICAL IMPRESSION(S) / ED DIAGNOSES   Final diagnoses:  Medication refill     Rx / DC Orders   ED Discharge Orders          Ordered    amLODipine (NORVASC) 5 MG tablet  Daily,   Status:  Discontinued        02/26/23 2210    amLODipine (NORVASC) 5 MG tablet  Daily        02/26/23 2218             Note:   This document was prepared using Dragon voice recognition software and may include unintentional dictation errors.   Pia Mau Closter, Cordelia Poche 02/26/23 2322    Pilar Jarvis, MD 02/27/23 [phone]

## 2023-07-24 ENCOUNTER — Other Ambulatory Visit: Payer: Self-pay

## 2023-07-24 ENCOUNTER — Encounter: Payer: Self-pay | Admitting: Emergency Medicine

## 2023-07-24 ENCOUNTER — Emergency Department: Payer: 59

## 2023-07-24 ENCOUNTER — Emergency Department
Admission: EM | Admit: 2023-07-24 | Discharge: 2023-07-25 | Disposition: A | Discharge status: Home / Self Care | Payer: 59 | Attending: Emergency Medicine | Admitting: Emergency Medicine

## 2023-07-24 DIAGNOSIS — R0789 Other chest pain: Secondary | ICD-10-CM | POA: Diagnosis present

## 2023-07-24 DIAGNOSIS — J441 Chronic obstructive pulmonary disease with (acute) exacerbation: Secondary | ICD-10-CM | POA: Insufficient documentation

## 2023-07-24 LAB — CBC
HCT: 50.1 % (ref 39.0–52.0)
Hemoglobin: 16.1 g/dL (ref 13.0–17.0)
MCH: 31.3 pg (ref 26.0–34.0)
MCHC: 32.1 g/dL (ref 30.0–36.0)
MCV: 97.5 fL (ref 80.0–100.0)
Platelets: 232 10*3/uL (ref 150–400)
RBC: 5.14 MIL/uL (ref 4.22–5.81)
RDW: 14.4 % (ref 11.5–15.5)
WBC: 7.8 10*3/uL (ref 4.0–10.5)
nRBC: 0 % (ref 0.0–0.2)

## 2023-07-24 LAB — RESP PANEL BY RT-PCR (RSV, FLU A&B, COVID)  RVPGX2
Influenza A by PCR: NEGATIVE
Influenza B by PCR: NEGATIVE
Resp Syncytial Virus by PCR: NEGATIVE
SARS Coronavirus 2 by RT PCR: NEGATIVE

## 2023-07-24 LAB — BASIC METABOLIC PANEL
Anion gap: 9 (ref 5–15)
BUN: 12 mg/dL (ref 6–20)
CO2: 26 mmol/L (ref 22–32)
Calcium: 8.5 mg/dL — ABNORMAL LOW (ref 8.9–10.3)
Chloride: 102 mmol/L (ref 98–111)
Creatinine, Ser: 0.71 mg/dL (ref 0.61–1.24)
GFR, Estimated: >60 mL/min (ref >60)
Glucose, Bld: 100 mg/dL — ABNORMAL HIGH (ref 70–99)
Potassium: 3.6 mmol/L (ref 3.5–5.1)
Sodium: 137 mmol/L (ref 135–145)

## 2023-07-24 LAB — TROPONIN I (HIGH SENSITIVITY)
Troponin I (High Sensitivity): 7 ng/L (ref <18)
Troponin I (High Sensitivity): 7 ng/L (ref <18)

## 2023-07-24 MED ORDER — AMLODIPINE BESYLATE 5 MG PO TABS: 5.0000 mg | ORAL_TABLET | Freq: Every day | ORAL | 2 refills | Status: DC

## 2023-07-24 MED ORDER — IPRATROPIUM-ALBUTEROL 0.5-2.5 (3) MG/3ML IN SOLN
6.0000 mL | Freq: Once | RESPIRATORY_TRACT | Status: AC
  Administered 2023-07-25: 6 mL via RESPIRATORY_TRACT
  Filled 2023-07-24: qty 6

## 2023-07-24 MED ORDER — DOXYCYCLINE HYCLATE 100 MG PO TABS: 100.0000 mg | ORAL_TABLET | Freq: Two times a day (BID) | ORAL | 0 refills | Status: AC

## 2023-07-24 MED ORDER — ALBUTEROL SULFATE HFA 108 (90 BASE) MCG/ACT IN AERS: 2.0000 | INHALATION_SPRAY | Freq: Four times a day (QID) | RESPIRATORY_TRACT | 2 refills | Status: DC | PRN

## 2023-07-24 MED ORDER — PREDNISONE 50 MG PO TABS: 50.0000 mg | ORAL_TABLET | Freq: Every day | ORAL | 0 refills | Status: AC

## 2023-07-24 MED ORDER — DOXYCYCLINE HYCLATE 100 MG PO TABS
100.0000 mg | ORAL_TABLET | Freq: Once | ORAL | Status: AC
  Administered 2023-07-25: 100 mg via ORAL
  Filled 2023-07-24: qty 1

## 2023-07-24 MED ORDER — PREDNISONE 20 MG PO TABS
60.0000 mg | ORAL_TABLET | Freq: Once | ORAL | Status: AC
  Administered 2023-07-25: 60 mg via ORAL
  Filled 2023-07-24: qty 3

## 2023-07-24 MED ORDER — ALBUTEROL SULFATE HFA 108 (90 BASE) MCG/ACT IN AERS: 1.0000 | INHALATION_SPRAY | Freq: Once | RESPIRATORY_TRACT | Status: DC

## 2023-07-24 NOTE — ED Provider Notes (Signed)
[facility] Provider Note    Event Date/Time   First MD Initiated Contact with Patient 07/24/23 2316     (approximate)   History   Chest Pain and Shortness of Breath   HPI  Mark Mcintosh is a 52 y.o. male who presents to the ED for evaluation of Chest Pain and Shortness of Breath   Patient presents for evaluation of about 2 days of diffuse myalgias and malaise, cough with increased sputum production.  "Feeling like crap."  Smokes 1 PPD.  No inhalers at home.   Physical Exam   Triage Vital Signs: ED Triage Vitals  Encounter Vitals Group     BP 07/24/23 1906 (!) 170/116     Systolic BP Percentile --      Diastolic BP Percentile --      Pulse Rate 07/24/23 1906 93     Resp 07/24/23 1906 20     Temp 07/24/23 1906 98.3 F (36.8 C)     Temp Source 07/24/23 1906 Oral     SpO2 07/24/23 1906 98 %     Weight 07/24/23 1908 264 lb 8.8 oz (120 kg)     Height 07/24/23 1908 5\' 11"  (1.803 m)     Head Circumference --      Peak Flow --      Pain Score 07/24/23 1908 6     Pain Loc --      Pain Education --      Exclude from Growth Chart --     Most recent vital signs: Vitals:   07/24/23 2251 07/25/23 0148  BP: 123/80 128/82  Pulse: 91 88  Resp: 19 18  Temp: 98.6 F (37 C) 98.6 F (37 C)  SpO2: 95% 96%    General: Awake, no distress.  Frequently coughing, but able to catch his breath and speak in full sentences between these coughing fits. CV:  Good peripheral perfusion.  Resp:  Normal effort.  Wheezing throughout, mildly decreased airflow Abd:  No distention.  MSK:  No deformity noted.  Neuro:  No focal deficits appreciated. Other:     ED Results / Procedures / Treatments   Labs (all labs ordered are listed, but only abnormal results are displayed) Labs Reviewed  BASIC METABOLIC PANEL - Abnormal; Notable for the following components:      Result Value   Glucose, Bld 100 (*)    Calcium 8.5 (*)    All other components within normal  limits  RESP PANEL BY RT-PCR (RSV, FLU A&B, COVID)  RVPGX2  CBC  TROPONIN I (HIGH SENSITIVITY)  TROPONIN I (HIGH SENSITIVITY)    EKG Sinus rhythm, rate of 91bpm, leftward axis. Signs of LVH. No STEMI  RADIOLOGY CXR interpreted by me without evidence of acute cardiopulmonary pathology.  Official radiology report(s): DG Chest 2 View  Result Date: 07/24/2023 CLINICAL DATA:  Chest pain, SOB. EXAM: CHEST - 2 VIEW COMPARISON:  03/06/2022. FINDINGS: Cardiac silhouette is unremarkable. Central pulmonary arteries are prominent suggesting pulmonary arterial hypertension. No alveolar consolidation. No pneumothorax or pleural effusion. Osseous structures grossly intact. IMPRESSION: Findings suggest pulmonary arterial hypertension. Otherwise no acute cardiopulmonary process. Electronically Signed   By: Layla Maw M.D.   On: 07/24/2023 20:36    PROCEDURES and INTERVENTIONS:  .1-3 Lead EKG Interpretation  Performed by: Delton Prairie, MD Authorized by: Delton Prairie, MD     Interpretation: normal     ECG rate:  90   ECG rate assessment: normal     Rhythm:  sinus rhythm     Ectopy: none     Conduction: normal     Medications  ipratropium-albuterol (DUONEB) 0.5-2.5 (3) MG/3ML nebulizer solution 6 mL (6 mLs Nebulization Given 07/25/23 0016)  predniSONE (DELTASONE) tablet 60 mg (60 mg Oral Given 07/25/23 0014)  doxycycline (VIBRA-TABS) tablet 100 mg (100 mg Oral Given 07/25/23 0014)     IMPRESSION / MDM / ASSESSMENT AND PLAN / ED COURSE  I reviewed the triage vital signs and the nursing notes.  Differential diagnosis includes, but is not limited to, ACS, PTX, PNA, muscle strain/spasm, PE, dissection, anxiety, pleural effusion  {Patient presents with symptoms of an acute illness or injury that is potentially life-threatening.  Smoker presents with signs of a viral syndrome with associated wheezing and likely COPD exacerbation.  Reassuring vital signs.  Signs of wheezing and likely COPD  exacerbation on exam.  Clear CXR, normal CBC, metabolic panel and 2 negative troponins.  We will start him on antibiotics due to his increased sputum production, steroids and breathing treatments and reassess, I suspect will be suitable for outpatient management.  Improving symptoms after medications, suitable for outpatient management.  Referred to PCP.  Discharged with refill of his antihypertensives as well as a course of antibiotics and steroids, albuterol as needed     FINAL CLINICAL IMPRESSION(S) / ED DIAGNOSES   Final diagnoses:  COPD exacerbation (HCC)  Other chest pain     Rx / DC Orders   ED Discharge Orders          Ordered    doxycycline (VIBRA-TABS) 100 MG tablet  2 times daily        07/24/23 2358    predniSONE (DELTASONE) 50 MG tablet  Daily        07/24/23 2358    amLODipine (NORVASC) 5 MG tablet  Daily        07/24/23 2358    albuterol (VENTOLIN HFA) 108 (90 Base) MCG/ACT inhaler  Every 6 hours PRN        07/24/23 2358    Ambulatory Referral to Primary Care (Establish Care)       Comments: Smoker. Seen for COPD exacerbation, URI. HTN. CXR with signs of pulmonary HTN.   07/24/23 2358             Note:  This document was prepared using Dragon voice recognition software and may include unintentional dictation errors.   Delton Prairie, MD 07/25/23 [phone]

## 2023-07-24 NOTE — ED Triage Notes (Signed)
Patient ambulatory to triage with complaints of chest tightness and shortness of breath accompanied with cough and body aches since Tuesday night. Also endorses that he has been out of his BP meds since Tuesday as well.

## 2023-09-21 ENCOUNTER — Encounter: Payer: Self-pay | Admitting: Intensive Care

## 2023-09-21 ENCOUNTER — Emergency Department
Admission: EM | Admit: 2023-09-21 | Discharge: 2023-09-21 | Disposition: A | Discharge status: Home / Self Care | Payer: 59 | Attending: Emergency Medicine | Admitting: Emergency Medicine

## 2023-09-21 ENCOUNTER — Other Ambulatory Visit: Payer: Self-pay

## 2023-09-21 DIAGNOSIS — M62838 Other muscle spasm: Secondary | ICD-10-CM | POA: Diagnosis not present

## 2023-09-21 DIAGNOSIS — M25511 Pain in right shoulder: Secondary | ICD-10-CM | POA: Diagnosis not present

## 2023-09-21 DIAGNOSIS — R202 Paresthesia of skin: Secondary | ICD-10-CM | POA: Diagnosis present

## 2023-09-21 DIAGNOSIS — R509 Fever, unspecified: Secondary | ICD-10-CM | POA: Insufficient documentation

## 2023-09-21 DIAGNOSIS — Z20822 Contact with and (suspected) exposure to covid-19: Secondary | ICD-10-CM | POA: Diagnosis not present

## 2023-09-21 LAB — CBC WITH DIFFERENTIAL/PLATELET
Abs Immature Granulocytes: 0.02 10*3/uL (ref 0.00–0.07)
Basophils Absolute: 0 10*3/uL (ref 0.0–0.1)
Basophils Relative: 1 %
Eosinophils Absolute: 0.3 10*3/uL (ref 0.0–0.5)
Eosinophils Relative: 6 %
HCT: 50.5 % (ref 39.0–52.0)
Hemoglobin: 16.3 g/dL (ref 13.0–17.0)
Immature Granulocytes: 0 %
Lymphocytes Relative: 29 %
Lymphs Abs: 1.4 10*3/uL (ref 0.7–4.0)
MCH: 30.7 pg (ref 26.0–34.0)
MCHC: 32.3 g/dL (ref 30.0–36.0)
MCV: 95.1 fL (ref 80.0–100.0)
Monocytes Absolute: 0.7 10*3/uL (ref 0.1–1.0)
Monocytes Relative: 15 %
Neutro Abs: 2.3 10*3/uL (ref 1.7–7.7)
Neutrophils Relative %: 49 %
Platelets: 196 10*3/uL (ref 150–400)
RBC: 5.31 MIL/uL (ref 4.22–5.81)
RDW: 14.6 % (ref 11.5–15.5)
WBC: 4.6 10*3/uL (ref 4.0–10.5)
nRBC: 0 % (ref 0.0–0.2)

## 2023-09-21 LAB — COMPREHENSIVE METABOLIC PANEL
ALT: 18 U/L (ref 0–44)
AST: 19 U/L (ref 15–41)
Albumin: 3.7 g/dL (ref 3.5–5.0)
Alkaline Phosphatase: 64 U/L (ref 38–126)
Anion gap: 10 (ref 5–15)
BUN: 16 mg/dL (ref 6–20)
CO2: 26 mmol/L (ref 22–32)
Calcium: 8.6 mg/dL — ABNORMAL LOW (ref 8.9–10.3)
Chloride: 102 mmol/L (ref 98–111)
Creatinine, Ser: 0.84 mg/dL (ref 0.61–1.24)
GFR, Estimated: >60 mL/min (ref >60)
Glucose, Bld: 95 mg/dL (ref 70–99)
Potassium: 3.8 mmol/L (ref 3.5–5.1)
Sodium: 138 mmol/L (ref 135–145)
Total Bilirubin: 0.5 mg/dL (ref 0.0–1.2)
Total Protein: 7.7 g/dL (ref 6.5–8.1)

## 2023-09-21 LAB — RESP PANEL BY RT-PCR (RSV, FLU A&B, COVID)  RVPGX2
Influenza A by PCR: NEGATIVE
Influenza B by PCR: NEGATIVE
Resp Syncytial Virus by PCR: NEGATIVE
SARS Coronavirus 2 by RT PCR: NEGATIVE

## 2023-09-21 MED ORDER — METHOCARBAMOL 500 MG PO TABS
500.0000 mg | ORAL_TABLET | Freq: Once | ORAL | Status: AC
  Administered 2023-09-21: 500 mg via ORAL
  Filled 2023-09-21: qty 1

## 2023-09-21 MED ORDER — KETOROLAC TROMETHAMINE 30 MG/ML IJ SOLN
30.0000 mg | Freq: Once | INTRAMUSCULAR | Status: AC
  Administered 2023-09-21: 30 mg via INTRAMUSCULAR
  Filled 2023-09-21: qty 1

## 2023-09-21 MED ORDER — ACETAMINOPHEN 500 MG PO TABS
1000.0000 mg | ORAL_TABLET | Freq: Once | ORAL | Status: AC
  Administered 2023-09-21: 1000 mg via ORAL
  Filled 2023-09-21: qty 2

## 2023-09-21 MED ORDER — LIDOCAINE 5 % EX PTCH: 1.0000 | MEDICATED_PATCH | Freq: Two times a day (BID) | CUTANEOUS | 0 refills | Status: DC

## 2023-09-21 MED ORDER — LIDOCAINE 5 % EX PTCH
1.0000 | MEDICATED_PATCH | CUTANEOUS | Status: DC
  Administered 2023-09-21: 1 via TRANSDERMAL
  Filled 2023-09-21: qty 1

## 2023-09-21 NOTE — ED Provider Notes (Signed)
[facility] Provider Note    Event Date/Time   First MD Initiated Contact with Patient 09/21/23 1524     (approximate)   History   Fever and Numbness   HPI  Branch Pacitti is a 53 y.o. male who presents to the ED for evaluation of Fever and Numbness   Patient presents for evaluation of paresthesias to his right hand and arm in the setting of atraumatic right shoulder pain after a resolving febrile illness earlier this week.  About 5 days ago patient developed fevers diffuse myalgias and malaise without any focal symptoms.  Reports he is now improving but for the past couple days he has had severe pain to his right posterior shoulder without trauma, falls or injuries.  Subsequent paresthesias to the right hand and he is concerned that he might be having a stroke.   Physical Exam   Triage Vital Signs: ED Triage Vitals  Encounter Vitals Group     BP 09/21/23 1352 (!) 138/105     Systolic BP Percentile --      Diastolic BP Percentile --      Pulse Rate 09/21/23 1352 79     Resp 09/21/23 1352 16     Temp 09/21/23 1352 98.4 F (36.9 C)     Temp Source 09/21/23 1352 Oral     SpO2 09/21/23 1352 93 %     Weight 09/21/23 1354 260 lb (117.9 kg)     Height 09/21/23 1354 5\' 11"  (1.803 m)     Head Circumference --      Peak Flow --      Pain Score 09/21/23 1354 5     Pain Loc --      Pain Education --      Exclude from Growth Chart --     Most recent vital signs: Vitals:   09/21/23 1800 09/21/23 1930  BP: (!) 140/101 (!) 120/102  Pulse: 82 78  Resp: 17 18  Temp:  98.3 F (36.8 C)  SpO2: 96% 95%    General: Awake, no distress.  CV:  Good peripheral perfusion.  Resp:  Normal effort.  Abd:  No distention.  MSK:  No deformity noted.  Neuro:  No focal deficits appreciated. Cranial nerves II through XII intact 5/5 strength and sensation in all 4 extremities Other:  Some tenderness to the right trapezius musculature without overlying skin  changes or rash   ED Results / Procedures / Treatments   Labs (all labs ordered are listed, but only abnormal results are displayed) Labs Reviewed  COMPREHENSIVE METABOLIC PANEL - Abnormal; Notable for the following components:      Result Value   Calcium 8.6 (*)    All other components within normal limits  RESP PANEL BY RT-PCR (RSV, FLU A&B, COVID)  RVPGX2  CBC WITH DIFFERENTIAL/PLATELET    EKG   RADIOLOGY   Official radiology report(s): No results found.  PROCEDURES and INTERVENTIONS:  Procedures  Medications  lidocaine (LIDODERM) 5 % 1 patch (1 patch Transdermal Patch Applied 09/21/23 1841)  ketorolac (TORADOL) 30 MG/ML injection 30 mg (30 mg Intramuscular Given 09/21/23 1733)  acetaminophen (TYLENOL) tablet 1,000 mg (1,000 mg Oral Given 09/21/23 1840)  methocarbamol (ROBAXIN) tablet 500 mg (500 mg Oral Given 09/21/23 1840)     IMPRESSION / MDM / ASSESSMENT AND PLAN / ED COURSE  I reviewed the triage vital signs and the nursing notes.  Differential diagnosis includes, but is not limited to, stroke, myelopathy, muscular spasm, sepsis  {  Patient presents with symptoms of an acute illness or injury that is potentially life-threatening.  Patient presents with right arm paresthesias I suspect is due to muscular spasm and a peripheral neuropathy.  Doubt central cause.  No trauma, falls or other strokelike symptoms.  Suspect a viral illness preceding this.  Resolving symptoms, looks well with normal vitals, CBC and metabolic panel.  Viral swabs here are negative.  Provide nonnarcotic multimodal analgesia for his trapezius spasm and reassess.  Clinical Course as of 09/21/23 1951  Sun Sep 21, 2023  1950 Reassessed.  Resolution of symptoms.  Patient reports feeling much better and is appreciative.  Has a normal exam now.  Discussed management at home and ED return precautions. [DS]    Clinical Course User Index [DS] Delton Prairie, MD     FINAL CLINICAL IMPRESSION(S) / ED  DIAGNOSES   Final diagnoses:  Trapezius muscle spasm  Paresthesias     Rx / DC Orders   ED Discharge Orders          Ordered    lidocaine (LIDODERM) 5 %  Every 12 hours        09/21/23 1951             Note:  This document was prepared using Dragon voice recognition software and may include unintentional dictation errors.   Delton Prairie, MD 09/21/23 [phone]

## 2023-09-21 NOTE — Discharge Instructions (Addendum)
Use Tylenol for pain and fevers.  Up to 1000 mg per dose, up to 4 times per day.  Do not take more than 4000 mg of Tylenol/acetaminophen within 24 hours..  Use naproxen/Aleve for anti-inflammatory pain relief. Use up to 500mg  every 12 hours. Do not take more frequently than this. Do not use other NSAIDs (ibuprofen, Advil) while taking this medication. It is safe to take Tylenol with this.   Please use lidocaine patches at your site of pain.  Apply 1 patch at a time, leave on for 12 hours, then remove for 12 hours.  12 hours on, 12 hours off.  Do not apply more than 1 patch at a time.

## 2023-09-21 NOTE — ED Notes (Signed)
MD, Katrinka Blazing in room now.

## 2023-09-21 NOTE — ED Notes (Signed)
Madelaine Bhat, MD to pain increasing.

## 2023-09-21 NOTE — ED Triage Notes (Signed)
Patient reports waking up Friday morning with fever, body soreness and right shoulder/arm/finger numbness.

## 2023-09-21 NOTE — ED Notes (Signed)
Alerted MD, Katrinka Blazing about patients arm pain.

## 2023-09-21 NOTE — ED Notes (Signed)
Pt resting in stretcher at this time, states right arm pain is improved, still endorsing right hand tingling. Afebrile.

## 2023-11-21 ENCOUNTER — Ambulatory Visit: Payer: 59 | Admitting: Family Medicine

## 2023-12-23 ENCOUNTER — Emergency Department
Admission: EM | Admit: 2023-12-23 | Discharge: 2023-12-23 | Disposition: A | Discharge status: Home / Self Care | Attending: Emergency Medicine | Admitting: Emergency Medicine

## 2023-12-23 ENCOUNTER — Other Ambulatory Visit: Payer: Self-pay

## 2023-12-23 DIAGNOSIS — H1033 Unspecified acute conjunctivitis, bilateral: Secondary | ICD-10-CM | POA: Diagnosis not present

## 2023-12-23 DIAGNOSIS — Z79899 Other long term (current) drug therapy: Secondary | ICD-10-CM | POA: Diagnosis not present

## 2023-12-23 DIAGNOSIS — I10 Essential (primary) hypertension: Secondary | ICD-10-CM | POA: Insufficient documentation

## 2023-12-23 DIAGNOSIS — H5789 Other specified disorders of eye and adnexa: Secondary | ICD-10-CM | POA: Diagnosis present

## 2023-12-23 MED ORDER — ERYTHROMYCIN 5 MG/GM OP OINT: 1.0000 | TOPICAL_OINTMENT | Freq: Four times a day (QID) | OPHTHALMIC | 0 refills | Status: AC

## 2023-12-23 MED ORDER — AMLODIPINE BESYLATE 5 MG PO TABS: 5.0000 mg | ORAL_TABLET | Freq: Every day | ORAL | 3 refills | Status: DC

## 2023-12-23 NOTE — ED Notes (Signed)
 See triage note  Presents with elevated b/p  States he has been out of meds over 1 week  Also noticed redness and drainage to left eye for the past 2 days

## 2023-12-23 NOTE — ED Triage Notes (Signed)
 Pt comes in via pov because he went out of town and left his blood pressure medication. Pt states that he hasn't had his medication in about a week. Pt is complaining of left eye pain 3/10. Pt is alert and oriented x4, with no complaints of CP,SOB, or dizziness. Pt does complain of a headache 3/10, and his eyes are very red on presentation. Pt is alert and oriented x4, with no other signs of acute distress.

## 2023-12-23 NOTE — ED Provider Notes (Signed)
 [facility] Provider Note    Event Date/Time   First MD Initiated Contact with Patient 12/23/23 0719     (approximate)   History   No chief complaint on file.   HPI  Mark Mcintosh is a 53 y.o. male who presents to the ED for evaluation of No chief complaint on file.   Patient presents to the ED requesting a refill of his antihypertensive prescription of amlodipine  that he has been without for the past couple weeks.  Reports going out of town a couple weeks ago but accidentally leaving the medicine behind and is requesting a refill.  Does not currently have a PCP.  Also reporting awakening yesterday morning with his left eye crusted shut.  Reports irritation of bilateral eyes, L > R, over the past 24 hours.  He wears glasses but no contact lenses.  No ocular trauma.   Physical Exam   Triage Vital Signs: ED Triage Vitals  Encounter Vitals Group     BP 12/23/23 0712 (!) 165/123     Systolic BP Percentile --      Diastolic BP Percentile --      Pulse Rate 12/23/23 0712 86     Resp 12/23/23 0712 17     Temp 12/23/23 0712 98.3 F (36.8 C)     Temp src --      SpO2 12/23/23 0712 92 %     Weight --      Height --      Head Circumference --      Peak Flow --      Pain Score 12/23/23 0713 3     Pain Loc --      Pain Education --      Exclude from Growth Chart --     Most recent vital signs: Vitals:   12/23/23 0712  BP: (!) 165/123  Pulse: 86  Resp: 17  Temp: 98.3 F (36.8 C)  SpO2: 92%    General: Awake, no distress.  Sitting upright on the edge of the bed, well-appearing, pleasant and conversational CV:  Good peripheral perfusion.  Resp:  Normal effort.  Abd:  No distention.  MSK:  No deformity noted.  Neuro:  No focal deficits appreciated. Cranial nerves II through XII intact 5/5 strength and sensation in all 4 extremities Other:  Bilateral conjunctivitis.  PERRL pupils that are midrange.  EOM intact without signs of entrapment.   No proptosis.   ED Results / Procedures / Treatments   Labs (all labs ordered are listed, but only abnormal results are displayed) Labs Reviewed - No data to display  EKG Sinus rhythm with a rate of 80 bpm.  Rightward axis.  No acute signs of acute ischemia.  QTc 493.  RADIOLOGY   Official radiology report(s): No results found.  PROCEDURES and INTERVENTIONS:  Procedures  Medications - No data to display   IMPRESSION / MDM / ASSESSMENT AND PLAN / ED COURSE  I reviewed the triage vital signs and the nursing notes.  Differential diagnosis includes, but is not limited to, medication noncompliance, uncontrolled hypertension, conjunctivitis either viral or bacterial, possibly allergic, ICH,  {Patient presents with symptoms of an acute illness or injury that is potentially life-threatening.  Patient presents with asymptomatic hypertension and conjunctivitis without evidence of neurologic deficits and suitable for outpatient management.  I considered obtaining CT head to assess for ICH but his syndrome is less likely to represent [facility] or more severe pathology.  Conjunctivitis without more severe  ocular derangements noted.  Will discharge with refills of his antihypertensives and erythromycin ointment.  No current interest in establishing with a PCP and so referral deferred at this point.  Discussed return precautions      FINAL CLINICAL IMPRESSION(S) / ED DIAGNOSES   Final diagnoses:  Uncontrolled hypertension  Acute conjunctivitis of both eyes, unspecified acute conjunctivitis type     Rx / DC Orders   ED Discharge Orders          Ordered    erythromycin ophthalmic ointment  4 times daily        12/23/23 0727    amLODipine  (NORVASC ) 5 MG tablet  Daily        12/23/23 0727             Note:  This document was prepared using Dragon voice recognition software and may include unintentional dictation errors.   Arline Bennett, MD 12/23/23 [phone]

## 2023-12-29 ENCOUNTER — Emergency Department

## 2023-12-29 ENCOUNTER — Emergency Department: Admission: EM | Admit: 2023-12-29 | Discharge: 2023-12-29 | Disposition: A | Discharge status: Home / Self Care

## 2023-12-29 ENCOUNTER — Other Ambulatory Visit: Payer: Self-pay

## 2023-12-29 DIAGNOSIS — H5713 Ocular pain, bilateral: Secondary | ICD-10-CM | POA: Diagnosis present

## 2023-12-29 DIAGNOSIS — I10 Essential (primary) hypertension: Secondary | ICD-10-CM | POA: Insufficient documentation

## 2023-12-29 MED ORDER — FLUORESCEIN SODIUM 1 MG OP STRP
1.0000 | ORAL_STRIP | Freq: Once | OPHTHALMIC | Status: AC
  Administered 2023-12-29: 1 via OPHTHALMIC
  Filled 2023-12-29: qty 1

## 2023-12-29 MED ORDER — TETRACAINE HCL 0.5 % OP SOLN
2.0000 [drp] | Freq: Once | OPHTHALMIC | Status: AC
  Administered 2023-12-29: 2 [drp] via OPHTHALMIC
  Filled 2023-12-29: qty 4

## 2023-12-29 MED ORDER — PREDNISOLONE ACETATE 1 % OP SUSP: 1.0000 [drp] | Freq: Four times a day (QID) | OPHTHALMIC | 0 refills | Status: DC

## 2023-12-29 NOTE — ED Notes (Signed)
 Visual Acuity  left 20/25 with glasses                        Right 20/25 with glasses

## 2023-12-29 NOTE — Discharge Instructions (Addendum)
 The CT scan of your eyes was normal.  Please use the eye drops as prescribed. Call Dr. Vester Gouty office tomorrow to schedule a follow up appointment for later this week.   Return to the ED for any worsening symptoms.

## 2023-12-29 NOTE — Consult Note (Signed)
  Reason for Consult:red eyes, possible proptosis Referring Physician: [name], [place] - ED  Mark Mcintosh is an 53 y.o. male.  Chief complaint: red painful eyes <principal problem not specified>  HPI: 53 yo BM with painful red eyes unresponsive to treatment with erythromycin  ung x 1 week.  C/o photosensitivity and blurred vision OU x 1 week. Wears glasses but no ocular pathology.  No autoimmune dz or sarcoidosis.   C/o watery discharge and mild mucus discharge. Report that he works around chemicals but no contact with his eyes with splashing or otherwise.  Past Medical History:  Diagnosis Date   Arthritis    Hypertension     ROS  Past Surgical History:  Procedure Laterality Date   ABDOMINAL SURGERY      History reviewed. No pertinent family history.  Social History:  reports that he has been smoking cigarettes and cigars. He has never used smokeless tobacco. He reports that he does not currently use alcohol after a past usage of about 2.0 standard drinks of alcohol per week. He reports current drug use. Drug: Marijuana.  Allergies: No Known Allergies  Medications: Scheduled:  No results found for this or any previous visit (from the past 48 hours).  No results found.  Blood pressure (!) 129/93, pulse 85, temperature 98.5 F (36.9 C), resp. rate 18, height 5\' 11"  (1.803 m), weight 117.9 kg, SpO2 96%.  Mental status: Alert and Oriented x 4  Visual Acuity:  20/50 OD  20/50 near [place]  Pupils:  Equally round/ reactive to light.  No Afferent defect.  Motility:  Full/ orthophoric  Visual Fields:  Full to confrontation  IOP:  21 OD , 25 OS with tonopen  External/ Lids/ Lashes:  shallow orbits OU with prominent globes OU  Anterior Segment:  Conjunctiva:  2+ injection OD 3+ injection OS Herniated orbital fat superotemporally OU  Cornea:  Normal  OU  Anterior Chamber: Normal  OU  Lens:   Normal OU  Posterior Segment: Dilated OU with 1% Tropicamide and 2.5%  Phenylephrine  Discs:   Normal c/d ratio, no pallor, no edema OU  Macula:  Normal  Vessels/ Periphery: Normal    Assessment/Plan: Bilateral conjunctival inflammation - diff dx includes episceritis, anterior uveitis, orbital pseudotumor, thyroid eye disease. Will obtain CT orbits to r/o orbital inflammation. Treat with topical pred gtts QID OU and f/u this week at [facility].  If NI then may need oral NSAID or PO Pred.   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 Elevated IOP is mild- will follow up in office. Pt is not experiencing angle closure.  Mark Mcintosh 12/29/2023, 5:11 PM

## 2023-12-29 NOTE — ED Provider Notes (Signed)
 [facility] Provider Note    Event Date/Time   First MD Initiated Contact with Patient 12/29/23 1500     (approximate)   History   Eye Pain   HPI  Mark Mcintosh is a 53 y.o. male with PMH of hypertension and arthritis who presents for evaluation of bilateral eye pain. Patient states this has been going on since last Saturday 5/6 when he came to the ED. He was given erythromycin  ointment for conjunctivitis and has not had any improvement. He endorses clear drainage. He states that the left eye has been more red than the right. He now has blurry vision, which he did not have before. He has sensitivity to light. He denies a foreign body sensation. He does wear glasses but not contacts. No history of eye trauma.      Physical Exam   Triage Vital Signs: ED Triage Vitals  Encounter Vitals Group     BP 12/29/23 1421 (!) 129/93     Systolic BP Percentile --      Diastolic BP Percentile --      Pulse Rate 12/29/23 1421 85     Resp 12/29/23 1421 18     Temp 12/29/23 1421 98.5 F (36.9 C)     Temp src --      SpO2 12/29/23 1421 96 %     Weight 12/29/23 1419 259 lb 14.8 oz (117.9 kg)     Height 12/29/23 1419 5\' 11"  (1.803 m)     Head Circumference --      Peak Flow --      Pain Score 12/29/23 1420 2     Pain Loc --      Pain Education --      Exclude from Growth Chart --     Most recent vital signs: Vitals:   12/29/23 1421 12/29/23 1823  BP: (!) 129/93 130/88  Pulse: 85 80  Resp: 18 18  Temp: 98.5 F (36.9 C) 98.3 F (36.8 C)  SpO2: 96% 96%   General: Awake, no distress.  CV:  Good peripheral perfusion.  Resp:  Normal effort.  Abd:  No distention.  Other:  Left conjunctiva is injected throughout, right conjunctiva is injected on the medial side, PERRL, EOM intact and does not produce pain, clear drainage from left eye.   ED Results / Procedures / Treatments   Labs (all labs ordered are listed, but only abnormal results are  displayed) Labs Reviewed - No data to display  RADIOLOGY  CT orbits obtained, I interpreted the images as well as reviewed the radiologist report.    PROCEDURES:  Critical Care performed: No  Procedures   MEDICATIONS ORDERED IN ED: Medications  fluorescein ophthalmic strip 1 strip (1 strip Both Eyes Given by Other 12/29/23 1542)  tetracaine (PONTOCAINE) 0.5 % ophthalmic solution 2 drop (2 drops Both Eyes Given by Other 12/29/23 1542)     IMPRESSION / MDM / ASSESSMENT AND PLAN / ED COURSE  I reviewed the triage vital signs and the nursing notes.                             53 year old male presents for evaluation of bilateral eye pain and redness. Diastolic BP is elevated, otherwise VSS. Patient a bit uncomfortable on exam.  Differential diagnosis includes, but is not limited to, conjunctivitis, inflammatory iritis, corneal abrasion, keratitis, acute angle closure glaucoma.  Patient's presentation is most consistent with  acute complicated illness / injury requiring diagnostic workup.  Fluorescein dye exam shows some uptake in bilateral eyes on inferior edge of the iris where it meets the sclera. Both eyes are injected.   Visual acuity is 20/25 in left and right eyes with glasses. Patient does not wear contacts.  Considered conjunctivitis given the injected conjunctiva on exam, however if it was bacterial it should have been treated with the erythromycin  ointment he was previously prescribed. If it was viral, it should have resolved by now. I don't suspect allergic conjunctivitis as patient does not have other allergy symptoms. Low suspicion for acute angle closure glaucoma as patient's pupils respond to light. Did also consider keratitis but patient does not have any worsened visual acuity or foreign body sensation.   I reached out to ophthalmology who was agreeable to come see the patient while in the ED. When I checked with the [facility], patient had elevated eye pressures, but Dr.  Ignatius Makos rechecked and they were normal. We will proceed with CT scan given concerns for proptosis. If normal, patient will follow up in the office in a few days. Will start him on pred acetate drops as this appears to be inflammatory.   Patient voiced understanding, all questions were answered and he was stable at discharge.  Clinical Course as of 12/29/23 1902  Mon Dec 29, 2023  1027 CT Orbits Wo Contrast Negative. [LD]    Clinical Course User Index [LD] Phyliss Breen, PA-C     FINAL CLINICAL IMPRESSION(S) / ED DIAGNOSES   Final diagnoses:  Pain of both eyes     Rx / DC Orders   ED Discharge Orders          Ordered    prednisoLONE acetate (PRED FORTE) 1 % ophthalmic suspension  4 times daily,   Status:  Discontinued        12/29/23 1719    prednisoLONE acetate (PRED FORTE) 1 % ophthalmic suspension  4 times daily        12/29/23 1901             Note:  This document was prepared using Dragon voice recognition software and may include unintentional dictation errors.   Phyliss Breen, PA-C 12/29/23 1902    Collis Deaner, MD 12/29/23 [id number]

## 2023-12-29 NOTE — ED Triage Notes (Addendum)
 Pt comes with bilateral eye pain and swelling. Pt states his eyes were red last Sunday. Pt states he thought it was from his bp meds at first. Pt was given meds for his eyes and it isn't getting better.

## 2024-01-09 ENCOUNTER — Other Ambulatory Visit: Payer: Self-pay

## 2024-01-09 ENCOUNTER — Emergency Department

## 2024-01-09 ENCOUNTER — Inpatient Hospital Stay
Admission: EM | Admit: 2024-01-09 | Discharge: 2024-01-14 | DRG: 065 | Disposition: A | Discharge status: Rehabilitation Facility | Attending: Internal Medicine | Admitting: Internal Medicine

## 2024-01-09 DIAGNOSIS — R400 Somnolence: Secondary | ICD-10-CM | POA: Diagnosis present

## 2024-01-09 DIAGNOSIS — E876 Hypokalemia: Secondary | ICD-10-CM | POA: Diagnosis present

## 2024-01-09 DIAGNOSIS — I10 Essential (primary) hypertension: Secondary | ICD-10-CM | POA: Insufficient documentation

## 2024-01-09 DIAGNOSIS — R29810 Facial weakness: Secondary | ICD-10-CM | POA: Diagnosis present

## 2024-01-09 DIAGNOSIS — Q2112 Patent foramen ovale: Secondary | ICD-10-CM

## 2024-01-09 DIAGNOSIS — F17200 Nicotine dependence, unspecified, uncomplicated: Secondary | ICD-10-CM | POA: Insufficient documentation

## 2024-01-09 DIAGNOSIS — F17210 Nicotine dependence, cigarettes, uncomplicated: Secondary | ICD-10-CM | POA: Diagnosis present

## 2024-01-09 DIAGNOSIS — I6319 Cerebral infarction due to embolism of other precerebral artery: Principal | ICD-10-CM | POA: Diagnosis present

## 2024-01-09 DIAGNOSIS — I639 Cerebral infarction, unspecified: Principal | ICD-10-CM | POA: Diagnosis present

## 2024-01-09 DIAGNOSIS — F17290 Nicotine dependence, other tobacco product, uncomplicated: Secondary | ICD-10-CM | POA: Diagnosis present

## 2024-01-09 DIAGNOSIS — Z716 Tobacco abuse counseling: Secondary | ICD-10-CM

## 2024-01-09 DIAGNOSIS — R7303 Prediabetes: Secondary | ICD-10-CM | POA: Diagnosis present

## 2024-01-09 DIAGNOSIS — Z7901 Long term (current) use of anticoagulants: Secondary | ICD-10-CM

## 2024-01-09 DIAGNOSIS — R29708 NIHSS score 8: Secondary | ICD-10-CM | POA: Diagnosis present

## 2024-01-09 DIAGNOSIS — E669 Obesity, unspecified: Secondary | ICD-10-CM | POA: Insufficient documentation

## 2024-01-09 DIAGNOSIS — Z7902 Long term (current) use of antithrombotics/antiplatelets: Secondary | ICD-10-CM

## 2024-01-09 DIAGNOSIS — J449 Chronic obstructive pulmonary disease, unspecified: Secondary | ICD-10-CM | POA: Diagnosis present

## 2024-01-09 DIAGNOSIS — Z79899 Other long term (current) drug therapy: Secondary | ICD-10-CM

## 2024-01-09 DIAGNOSIS — E66812 Obesity, class 2: Secondary | ICD-10-CM | POA: Diagnosis present

## 2024-01-09 DIAGNOSIS — G8194 Hemiplegia, unspecified affecting left nondominant side: Secondary | ICD-10-CM | POA: Diagnosis present

## 2024-01-09 DIAGNOSIS — Z7982 Long term (current) use of aspirin: Secondary | ICD-10-CM

## 2024-01-09 DIAGNOSIS — R471 Dysarthria and anarthria: Secondary | ICD-10-CM | POA: Diagnosis present

## 2024-01-09 DIAGNOSIS — Z6836 Body mass index (BMI) 36.0-36.9, adult: Secondary | ICD-10-CM

## 2024-01-09 DIAGNOSIS — E785 Hyperlipidemia, unspecified: Secondary | ICD-10-CM | POA: Diagnosis present

## 2024-01-09 DIAGNOSIS — Z7409 Other reduced mobility: Secondary | ICD-10-CM | POA: Diagnosis present

## 2024-01-09 HISTORY — DX: Cerebral infarction, unspecified: I63.9

## 2024-01-09 LAB — CBC
HCT: 44.5 % (ref 39.0–52.0)
Hemoglobin: 14.3 g/dL (ref 13.0–17.0)
MCH: 30.2 pg (ref 26.0–34.0)
MCHC: 32.1 g/dL (ref 30.0–36.0)
MCV: 94.1 fL (ref 80.0–100.0)
Platelets: 277 10*3/uL (ref 150–400)
RBC: 4.73 MIL/uL (ref 4.22–5.81)
RDW: 13.8 % (ref 11.5–15.5)
WBC: 5.6 10*3/uL (ref 4.0–10.5)
nRBC: 0 % (ref 0.0–0.2)

## 2024-01-09 LAB — COMPREHENSIVE METABOLIC PANEL WITH GFR
ALT: 13 U/L (ref 0–44)
AST: 20 U/L (ref 15–41)
Albumin: 3.9 g/dL (ref 3.5–5.0)
Alkaline Phosphatase: 61 U/L (ref 38–126)
Anion gap: 13 (ref 5–15)
BUN: 8 mg/dL (ref 6–20)
CO2: 28 mmol/L (ref 22–32)
Calcium: 9 mg/dL (ref 8.9–10.3)
Chloride: 98 mmol/L (ref 98–111)
Creatinine, Ser: 1 mg/dL (ref 0.61–1.24)
GFR, Estimated: >60 mL/min (ref >60)
Glucose, Bld: 106 mg/dL — ABNORMAL HIGH (ref 70–99)
Potassium: 3.1 mmol/L — ABNORMAL LOW (ref 3.5–5.1)
Sodium: 139 mmol/L (ref 135–145)
Total Bilirubin: 0.9 mg/dL (ref 0.0–1.2)
Total Protein: 8.1 g/dL (ref 6.5–8.1)

## 2024-01-09 LAB — DIFFERENTIAL
Abs Immature Granulocytes: 0.02 10*3/uL (ref 0.00–0.07)
Basophils Absolute: 0 10*3/uL (ref 0.0–0.1)
Basophils Relative: 0 %
Eosinophils Absolute: 0.2 10*3/uL (ref 0.0–0.5)
Eosinophils Relative: 4 %
Immature Granulocytes: 0 %
Lymphocytes Relative: 23 %
Lymphs Abs: 1.3 10*3/uL (ref 0.7–4.0)
Monocytes Absolute: 0.5 10*3/uL (ref 0.1–1.0)
Monocytes Relative: 9 %
Neutro Abs: 3.5 10*3/uL (ref 1.7–7.7)
Neutrophils Relative %: 64 %

## 2024-01-09 LAB — PROTIME-INR
INR: 1.1 (ref 0.8–1.2)
Prothrombin Time: 14.6 s (ref 11.4–15.2)

## 2024-01-09 LAB — APTT: aPTT: 30 s (ref 24–36)

## 2024-01-09 LAB — ETHANOL: Alcohol, Ethyl (B): <15 mg/dL (ref <15)

## 2024-01-09 LAB — CBG MONITORING, ED: Glucose-Capillary: 112 mg/dL — ABNORMAL HIGH (ref 70–99)

## 2024-01-09 MED ORDER — ASPIRIN 81 MG PO CHEW
324.0000 mg | CHEWABLE_TABLET | Freq: Once | ORAL | Status: AC
  Administered 2024-01-09: 324 mg via ORAL
  Filled 2024-01-09: qty 4

## 2024-01-09 MED ORDER — ACETAMINOPHEN 160 MG/5ML PO SOLN: 650.0000 mg | ORAL | Status: DC | PRN

## 2024-01-09 MED ORDER — ACETAMINOPHEN 325 MG PO TABS
650.0000 mg | ORAL_TABLET | ORAL | Status: DC | PRN
  Filled 2024-01-09: qty 2

## 2024-01-09 MED ORDER — ASPIRIN 81 MG PO TBEC
81.0000 mg | DELAYED_RELEASE_TABLET | Freq: Every day | ORAL | Status: DC
  Administered 2024-01-10 – 2024-01-14 (×5): 81 mg via ORAL
  Filled 2024-01-09 (×5): qty 1

## 2024-01-09 MED ORDER — CLOPIDOGREL BISULFATE 75 MG PO TABS
75.0000 mg | ORAL_TABLET | Freq: Every day | ORAL | Status: DC
  Administered 2024-01-10 – 2024-01-14 (×5): 75 mg via ORAL
  Filled 2024-01-09 (×5): qty 1

## 2024-01-09 MED ORDER — STROKE: EARLY STAGES OF RECOVERY BOOK: Freq: Once | Status: AC

## 2024-01-09 MED ORDER — ACETAMINOPHEN 650 MG RE SUPP: 650.0000 mg | RECTAL | Status: DC | PRN

## 2024-01-09 MED ORDER — NICOTINE 21 MG/24HR TD PT24
21.0000 mg | MEDICATED_PATCH | Freq: Every day | TRANSDERMAL | Status: DC
  Administered 2024-01-10 – 2024-01-14 (×5): 21 mg via TRANSDERMAL
  Filled 2024-01-09 (×5): qty 1

## 2024-01-09 MED ORDER — ENOXAPARIN SODIUM 60 MG/0.6ML IJ SOSY
0.5000 mg/kg | PREFILLED_SYRINGE | INTRAMUSCULAR | Status: DC
  Administered 2024-01-10 – 2024-01-13 (×5): 60 mg via SUBCUTANEOUS
  Filled 2024-01-09 (×5): qty 0.6

## 2024-01-09 NOTE — Assessment & Plan Note (Signed)
 Lifestyle counseling to decrease overall morbidity

## 2024-01-09 NOTE — Assessment & Plan Note (Signed)
Holding home antihypertensives to allow for permissive hypertension

## 2024-01-09 NOTE — ED Provider Notes (Signed)
 [facility] Provider Note    Event Date/Time   First MD Initiated Contact with Patient 01/09/24 1802     (approximate)  History   Chief Complaint: Headache  HPI  Mark Mcintosh is a 53 y.o. male with a past medical history of arthritis, hypertension, presents to the emergency department for left-sided weakness.  According to the patient yesterday he was at work when he acutely became weak and fatigued, needed assistance to get up from his work to his car due to left-sided deficits.  Patient denies any history of left-sided deficits.  States he was drinking some Gatorade yesterday and felt somewhat better however he was at work today and again symptoms recurred with significant left weakness.  Physical Exam   Triage Vital Signs: ED Triage Vitals  Encounter Vitals Group     BP 01/09/24 1743 (!) 132/96     Systolic BP Percentile --      Diastolic BP Percentile --      Pulse Rate 01/09/24 1743 92     Resp 01/09/24 1743 17     Temp 01/09/24 1743 97.8 F (36.6 C)     Temp src --      SpO2 01/09/24 1743 98 %     Weight 01/09/24 1730 260 lb (117.9 kg)     Height 01/09/24 1730 5\' 11"  (1.803 m)     Head Circumference --      Peak Flow --      Pain Score 01/09/24 1730 3     Pain Loc --      Pain Education --      Exclude from Growth Chart --     Most recent vital signs: Vitals:   01/09/24 1810 01/09/24 1830  BP:  (!) 146/107  Pulse: 74 72  Resp: 19 19  Temp:    SpO2: 91% 96%    General: Awake, no distress.  CV:  Good peripheral perfusion.  Regular rate and rhythm  Resp:  Normal effort.  Equal breath sounds bilaterally.  Abd:  No distention.  Soft, nontender.  No rebound or guarding. Other:  Patient has diminished grip strength in the left upper extremity compared to the right.  Patient also has a left facial droop as well as a left pronator drift.  No history of CVA previously per patient.   ED Results / Procedures / Treatments   EKG  EKG  viewed and interpreted by myself shows a normal sinus rhythm at 92 bpm with a narrow QRS, normal axis, normal intervals, no concerning ST changes.  RADIOLOGY  Patient's MRI has also showing acute infarcts in the posterior limb of the right internal capsule and pons.  Concerning for embolic showering.  Multiple remote infarcts.   MEDICATIONS ORDERED IN ED: Medications - No data to display   IMPRESSION / MDM / ASSESSMENT AND PLAN / ED COURSE  I reviewed the triage vital signs and the nursing notes.  Patient's presentation is most consistent with acute presentation with potential threat to life or bodily function.  Patient presents emergency department for left-sided weakness starting yesterday afternoon, continued today.  Seems to be somewhat intermittent.  Patient's lab work today shows a reassuring CBC with a reassuring chemistry.  Patient's ethanol is negative.  INR is normal.  However patient's examination is concerning given his left upper extremity weakness pronator drift and mild left facial droop.  Will obtain an MRI of the brain to further evaluate.  Patient agreeable to plan of care.  Patient's MRI unfortunately shows multiple acute infarcts concerning for embolic showering.  Will dose aspirin.  Will admit to the hospital service for further workup and treatment.  Symptoms started yesterday (approximately 30 hours ago) patient is well outside of any therapeutic window.  NIH Stroke Scale   Interval: Baseline Time: 9:16 PM Person Administering Scale: Ulys Favia  Administer stroke scale items in the order listed. Record performance in each category after each subscale exam. Do not go back and change scores. Follow directions provided for each exam technique. Scores should reflect what the patient does, not what the clinician thinks the patient can do. The clinician should record answers while administering the exam and work quickly. Except where indicated, the patient should  not be coached (i.e., repeated requests to patient to make a special effort).   1a  Level of consciousness: 0=alert; keenly responsive  1b. LOC questions:  0=Performs both tasks correctly  1c. LOC commands: 0=Performs both tasks correctly  2.  Best Gaze: 0=normal  3.  Visual: 0=No visual loss  4. Facial Palsy: 1=Minor paralysis (flattened nasolabial fold, asymmetric on smiling)  5a.  Motor left arm: 1=Drift, limb holds 90 (or 45) degrees but drifts down before full 10 seconds: does not hit bed  5b.  Motor right arm: 0=No drift, limb holds 90 (or 45) degrees for full 10 seconds  6a. motor left leg: 1=Drift, limb holds 90 (or 45) degrees but drifts down before full 10 seconds: does not hit bed  6b  Motor right leg:  0=No drift, limb holds 90 (or 45) degrees for full 10 seconds  7. Limb Ataxia: 2=Present in two limbs  8.  Sensory: 0=Normal; no sensory loss  9. Best Language:  0=No aphasia, normal  10. Dysarthria: 0=Normal  11. Extinction and Inattention: 0=No abnormality  12. Distal motor function: 1=At least some extension after 5 seconds, but is not fully extended. Any movement of the fingers which is not in response to a command is not scored   Total:   6    FINAL CLINICAL IMPRESSION(S) / ED DIAGNOSES   Weakness Acute CVA  Note:  This document was prepared using Dragon voice recognition software and may include unintentional dictation errors.   Ruth Cove, MD 01/09/24 2117

## 2024-01-09 NOTE — ED Notes (Signed)
Turkey sandwich and apple sauce given to patient.  

## 2024-01-09 NOTE — Progress Notes (Signed)
 Anticoagulation monitoring(Lovenox):  53 yo male ordered Lovenox 40 mg Q24h    Filed Weights   01/09/24 1730  Weight: 117.9 kg (260 lb)   BMI 36.3    Lab Results  Component Value Date   CREATININE 1.00 01/09/2024   CREATININE 0.84 09/21/2023   CREATININE 0.71 07/24/2023   Estimated Creatinine Clearance: 112.8 mL/min (by C-G formula based on SCr of 1 mg/dL). Hemoglobin & Hematocrit     Component Value Date/Time   HGB 14.3 01/09/2024 1741   HGB 13.8 10/29/2012 0533   HCT 44.5 01/09/2024 1741   HCT 41.0 10/29/2012 0533     Per Protocol for Patient with estCrcl > 30 ml/min and BMI > 30, will transition to Lovenox 60 mg Q24h.

## 2024-01-09 NOTE — H&P (Signed)
 History and Physical    Patient: Mark Mcintosh JXB:[id number] DOB: March 18, 1971 DOA: 01/09/2024 DOS: the patient was seen and examined on 01/09/2024 PCP: Pcp, No  Patient coming from: Home  Chief Complaint:  Chief Complaint  Patient presents with   Headache    HPI: Mark Mcintosh is a 53 y.o. male with medical history significant for hypertension and tobacco use disorder being admitted for an acute stroke.  He was in his usual state of health until the day prior when leaving work he felt overall weak and had to be helped to his car.  He went home and awoke this morning still feeling weak but noted that he was more weak on the left side which prompted the visit to the emergency room. ED course and data review: Initially with normal BP but went as high as 146/119.  Vitals otherwise unremarkable Labs including CBC, CMP, EtOH notable only for potassium of 3.1  EKG reviewed and interpreted showed normal sinus rhythm at 92 with nonspecific T wave abnormalities MRI brain showed an acute infarct in the posterior limb of the right internal capsule as well as multiple remote infarcts and chronic microvascular ischemic disease as follows: IMPRESSION: 1. Acute infarcts in the posterior limb of the right internal capsule, the overlying right frontal white matter, and pons. Given involvement of multiple vascular territories, consider an embolic etiology. 2. Multiple remote infarcts and chronic microvascular ischemic disease, detailed above.  Patient given chewable aspirin 324  Hospitalist consulted for admission.     Review of Systems: As mentioned in the history of present illness. All other systems reviewed and are negative.  Past Medical History:  Diagnosis Date   Arthritis    Hypertension    Past Surgical History:  Procedure Laterality Date   ABDOMINAL SURGERY     Social History:  reports that he has been smoking cigarettes and cigars. He has never used smokeless tobacco. He  reports that he does not currently use alcohol after a past usage of about 2.0 standard drinks of alcohol per week. He reports current drug use. Drug: Marijuana.  No Known Allergies  History reviewed. No pertinent family history.  Prior to Admission medications   Medication Sig Start Date End Date Taking? Authorizing Provider  albuterol  (PROVENTIL  HFA;VENTOLIN  HFA) 108 (90 Base) MCG/ACT inhaler Inhale 2 puffs into the lungs every 6 (six) hours as needed for wheezing or shortness of breath. 11/15/17   Driscilla George, PA-C  albuterol  (VENTOLIN  HFA) 108 (90 Base) MCG/ACT inhaler Use 2 puffs 4 times a day as needed for shortness of breath. Please dispense with a spacer. 03/06/22   Vianne Grad, MD  albuterol  (VENTOLIN  HFA) 108 [phone] Base) MCG/ACT inhaler Inhale 2 puffs into the lungs every 6 (six) hours as needed for wheezing or shortness of breath. 07/24/23   Arline Bennett, MD  amLODipine  (NORVASC ) 5 MG tablet Take 1 tablet (5 mg total) by mouth daily. 02/26/23 03/28/23  Woods, Jaclyn M, PA-C  amLODipine  (NORVASC ) 5 MG tablet Take 1 tablet (5 mg total) by mouth daily. 07/24/23 07/23/24  Arline Bennett, MD  amLODipine  (NORVASC ) 5 MG tablet Take 1 tablet (5 mg total) by mouth daily. 12/23/23 12/22/24  Arline Bennett, MD  betamethasone  valerate ointment (VALISONE ) 0.1 % Apply 1 application topically 2 (two) times daily. 12/09/20   Stafford Eagles, PA-C  lidocaine  (LIDODERM ) 5 % Place 1 patch onto the skin every 12 (twelve) hours. Remove & Discard patch within 12 hours or as directed by MD 09/21/23  09/20/24  Arline Bennett, MD  prednisoLONE  acetate (PRED FORTE ) 1 % ophthalmic suspension Place 1 drop into both eyes 4 (four) times daily. 12/29/23   Phyliss Breen, PA-C  predniSONE  (DELTASONE ) 10 MG tablet Take 2 pills a day for 4 days starting on July 20. 03/06/22   Vianne Grad, MD    Physical Exam: Vitals:   01/09/24 1800 01/09/24 1810 01/09/24 1830 01/09/24 2000  BP: (!) 136/103  (!) 146/107 (!) 146/119  Pulse:  85 74 72 65  Resp: (!) 24 19 19 18   Temp:      SpO2: 99% 91% 96% 91%  Weight:      Height:       Physical Exam Vitals and nursing note reviewed.  Constitutional:      General: He is not in acute distress. HENT:     Head: Normocephalic and atraumatic.  Cardiovascular:     Rate and Rhythm: Normal rate and regular rhythm.     Heart sounds: Normal heart sounds.  Pulmonary:     Effort: Pulmonary effort is normal.     Breath sounds: Normal breath sounds.  Abdominal:     Palpations: Abdomen is soft.     Tenderness: There is no abdominal tenderness.  Neurological:     Mental Status: Mental status is at baseline.     Cranial Nerves: Facial asymmetry present.     Comments: Decreased grip strength left relative to right     Labs on Admission: I have personally reviewed following labs and imaging studies  CBC: Recent Labs  Lab 01/09/24 1741  WBC 5.6  NEUTROABS 3.5  HGB 14.3  HCT 44.5  MCV 94.1  PLT 277   Basic Metabolic Panel: Recent Labs  Lab 01/09/24 1741  NA 139  K 3.1*  CL 98  CO2 28  GLUCOSE 106*  BUN 8  CREATININE 1.00  CALCIUM 9.0   GFR: Estimated Creatinine Clearance: 112.8 mL/min (by C-G formula based on SCr of 1 mg/dL). Liver Function Tests: Recent Labs  Lab 01/09/24 1741  AST 20  ALT 13  ALKPHOS 61  BILITOT 0.9  PROT 8.1  ALBUMIN 3.9   No results for input(s): "LIPASE", "AMYLASE" in the last 168 hours. No results for input(s): "AMMONIA" in the last 168 hours. Coagulation Profile: Recent Labs  Lab 01/09/24 1741  INR 1.1   Cardiac Enzymes: No results for input(s): "CKTOTAL", "CKMB", "CKMBINDEX", "TROPONINI" in the last 168 hours. BNP (last 3 results) No results for input(s): "PROBNP" in the last 8760 hours. HbA1C: No results for input(s): "HGBA1C" in the last 72 hours. CBG: Recent Labs  Lab 01/09/24 1749  GLUCAP 112*   Lipid Profile: No results for input(s): "CHOL", "HDL", "LDLCALC", "TRIG", "CHOLHDL", "LDLDIRECT" in the last 72  hours. Thyroid Function Tests: No results for input(s): "TSH", "T4TOTAL", "FREET4", "T3FREE", "THYROIDAB" in the last 72 hours. Anemia Panel: No results for input(s): "VITAMINB12", "FOLATE", "FERRITIN", "TIBC", "IRON", "RETICCTPCT" in the last 72 hours. Urine analysis:    Component Value Date/Time   COLORURINE STRAW (A) 12/09/2020 1020   APPEARANCEUR CLEAR (A) 12/09/2020 1020   LABSPEC 1.004 (L) 12/09/2020 1020   PHURINE 7.0 12/09/2020 1020   GLUCOSEU NEGATIVE 12/09/2020 1020   HGBUR SMALL (A) 12/09/2020 1020   BILIRUBINUR NEGATIVE 12/09/2020 1020   KETONESUR NEGATIVE 12/09/2020 1020   PROTEINUR NEGATIVE 12/09/2020 1020   NITRITE NEGATIVE 12/09/2020 1020   LEUKOCYTESUR NEGATIVE 12/09/2020 1020    Radiological Exams on Admission: MR BRAIN WO CONTRAST Result Date:  01/09/2024 CLINICAL DATA:  Neuro deficit, acute, stroke suspected left weakness EXAM: MRI HEAD WITHOUT CONTRAST TECHNIQUE: Multiplanar, multiecho pulse sequences of the brain and surrounding structures were obtained without intravenous contrast. COMPARISON:  CT head 02/26/2023. FINDINGS: Brain: Acute infarcts in the posterior limb of the right internal capsule and the overlying right frontal white matter. Additional acute infarct in the pons. Six remote infarct in the right frontal white matter, bilateral basal ganglia, and left periatrial white matter. Small remote infarct in the pons. Scattered T2/FLAIR hyperintensity white matter compatible with chronic microvascular ischemic disease. No evidence of acute hemorrhage, mass lesion, midline shift or hydrocephalus. Vascular: Major arterial flow voids are maintained skull base. Skull and upper cervical spine: Normal marrow signal. Sinuses/Orbits: Negative. IMPRESSION: 1. Acute infarcts in the posterior limb of the right internal capsule, the overlying right frontal white matter, and pons. Given involvement of multiple vascular territories, consider an embolic etiology. 2. Multiple remote  infarcts and chronic microvascular ischemic disease, detailed above. Electronically Signed   By: Stevenson Elbe M.D.   On: 01/09/2024 21:07   Data Reviewed for HPI: Relevant notes from primary care and specialist visits, past discharge summaries as available in EHR, including Care Everywhere. Prior diagnostic testing as pertinent to current admission diagnoses Updated medications and problem lists for reconciliation ED course, including vitals, labs, imaging, treatment and response to treatment Triage notes, nursing and pharmacy notes and ED provider's notes Notable results as noted above in HPI      Assessment and Plan: * Acute CVA (cerebrovascular accident) (HCC) Permissive hypertension for first 24-48 hrs post stroke onset: Prn Labetalol IV or Vasotec IV If BP greater than 220/120  Statins for LDL goal less than 70 ASA 81mg  daily, Plavix 75mg  daily x 3 weeks then monotherapy thereafter Telemetry, echocardiogram with bubble study Avoid dextrose containing fluids, Maintain euglycemia, euthermia Neuro checks q4 hrs x 24 hrs and then per shift Head of bed 30 degrees Physical therapy/Occupational therapy/Speech therapy if failed dysphagia screen Neurology consult to follow   Obesity (BMI 30-39.9) Lifestyle counseling to decrease overall morbidity  Hypertension Holding home antihypertensives to allow for permissive hypertension  Tobacco use disorder Nicotine patch        DVT prophylaxis: Lovenox  Consults: Neurology  Advance Care Planning: full code  Family Communication: none  Disposition Plan: Back to previous home environment  Severity of Illness: The appropriate patient status for this patient is OBSERVATION. Observation status is judged to be reasonable and necessary in order to provide the required intensity of service to ensure the patient's safety. The patient's presenting symptoms, physical exam findings, and initial radiographic and laboratory data in  the context of their medical condition is felt to place them at decreased risk for further clinical deterioration. Furthermore, it is anticipated that the patient will be medically stable for discharge from the hospital within 2 midnights of admission.   Author: Lanetta Pion, MD 01/09/2024 10:10 PM  For on call review [email or web address].

## 2024-01-09 NOTE — Assessment & Plan Note (Signed)
 Nicotine  patch

## 2024-01-09 NOTE — ED Notes (Signed)
 Pt eating snacks and drinks brought by wife- informed to remain NPO until scans are resulted. Pt verbalizes understanding and is compliant.

## 2024-01-09 NOTE — ED Notes (Addendum)
 Pt states he had fatigue and left sided weakness yesterday but thought it was due to dehydration. Pt states he still felt weak today and it got worse at work. Pt denies any numbness, c/o slurred speech, drooling, fatigue, headache, and left sided weakness. Left sided droop noted in mouth, drooling from left side of mouth during swallow assessment noted, left leg drift noted. Eyes appear red/bloodshot bilaterally. Pt is CAOX4. Pt reports he takes BP medication as ordered, denies blood thinners. See also triage note.

## 2024-01-09 NOTE — ED Triage Notes (Signed)
 Pt comes in via pov after having left sided weakness that started last night around 1045 pm. Pt's LKW yesterday at 3pm. Pt complains of a headache 3/10 at this time. Pt states that his vision has been blurry for quite sometime, due to some recent eye medication changes. Pt with some left sided deficits.  Pt is alert and oriented x4 with no signs of acute distress at this time.

## 2024-01-09 NOTE — Assessment & Plan Note (Signed)
 Permissive hypertension for first 24-48 hrs post stroke onset: Prn Labetalol IV or Vasotec IV If BP greater than 220/120  Statins for LDL goal less than 70 ASA 81mg  daily, Plavix 75mg  daily x 3 weeks then monotherapy thereafter Telemetry, echocardiogram with bubble study Avoid dextrose containing fluids, Maintain euglycemia, euthermia Neuro checks q4 hrs x 24 hrs and then per shift Head of bed 30 degrees Physical therapy/Occupational therapy/Speech therapy if failed dysphagia screen Neurology consult to follow

## 2024-01-10 ENCOUNTER — Observation Stay (HOSPITAL_BASED_OUTPATIENT_CLINIC_OR_DEPARTMENT_OTHER)
Admit: 2024-01-10 | Discharge: 2024-01-10 | Disposition: A | Discharge status: Home / Self Care | Attending: Internal Medicine

## 2024-01-10 ENCOUNTER — Observation Stay

## 2024-01-10 DIAGNOSIS — I10 Essential (primary) hypertension: Secondary | ICD-10-CM | POA: Diagnosis not present

## 2024-01-10 DIAGNOSIS — F17200 Nicotine dependence, unspecified, uncomplicated: Secondary | ICD-10-CM

## 2024-01-10 DIAGNOSIS — I6389 Other cerebral infarction: Secondary | ICD-10-CM

## 2024-01-10 DIAGNOSIS — I639 Cerebral infarction, unspecified: Secondary | ICD-10-CM

## 2024-01-10 DIAGNOSIS — R29708 NIHSS score 8: Secondary | ICD-10-CM

## 2024-01-10 DIAGNOSIS — I6340 Cerebral infarction due to embolism of unspecified cerebral artery: Secondary | ICD-10-CM

## 2024-01-10 DIAGNOSIS — Q2112 Patent foramen ovale: Secondary | ICD-10-CM | POA: Diagnosis not present

## 2024-01-10 LAB — HEMOGLOBIN A1C
Hgb A1c MFr Bld: 5.7 % — ABNORMAL HIGH (ref 4.8–5.6)
Mean Plasma Glucose: 116.89 mg/dL

## 2024-01-10 LAB — LIPID PANEL
Cholesterol: 185 mg/dL (ref 0–200)
HDL: 42 mg/dL (ref >40)
LDL Cholesterol: 120 mg/dL — ABNORMAL HIGH (ref 0–99)
Total CHOL/HDL Ratio: 4.4 ratio
Triglycerides: 117 mg/dL (ref <150)
VLDL: 23 mg/dL (ref 0–40)

## 2024-01-10 LAB — ECHOCARDIOGRAM COMPLETE BUBBLE STUDY
AR max vel: 3.35 cm2
AV Area VTI: 3.83 cm2
AV Area mean vel: 3.26 cm2
AV Mean grad: 3 mmHg
AV Peak grad: 6 mmHg
Ao pk vel: 1.22 m/s
Area-P 1/2: 4.49 cm2
Calc EF: 55.7 %
MV VTI: 2.14 cm2
S' Lateral: 3 cm
Single Plane A2C EF: 57.9 %
Single Plane A4C EF: 55.4 %

## 2024-01-10 LAB — HIV ANTIBODY (ROUTINE TESTING W REFLEX): HIV Screen 4th Generation wRfx: NONREACTIVE

## 2024-01-10 MED ORDER — ATORVASTATIN CALCIUM 20 MG PO TABS
20.0000 mg | ORAL_TABLET | Freq: Every day | ORAL | Status: DC
Start: 2024-01-10 — End: 2024-01-11
  Administered 2024-01-10 – 2024-01-11 (×2): 20 mg via ORAL
  Filled 2024-01-10 (×2): qty 1

## 2024-01-10 MED ORDER — POTASSIUM CHLORIDE CRYS ER 20 MEQ PO TBCR
40.0000 meq | EXTENDED_RELEASE_TABLET | Freq: Once | ORAL | Status: AC
  Administered 2024-01-10: 40 meq via ORAL
  Filled 2024-01-10: qty 2

## 2024-01-10 MED ORDER — HYDRALAZINE HCL 50 MG PO TABS: 25.0000 mg | ORAL_TABLET | Freq: Four times a day (QID) | ORAL | Status: DC | PRN

## 2024-01-10 MED ORDER — IOHEXOL 350 MG/ML SOLN
75.0000 mL | Freq: Once | INTRAVENOUS | Status: AC | PRN
  Administered 2024-01-10: 75 mL via INTRAVENOUS

## 2024-01-10 NOTE — Evaluation (Signed)
 Clinical/Bedside Swallow Evaluation Patient Details  Name: Mark Mcintosh MRN: [id number] Date of Birth: [birth year]  Today's Date: 01/10/2024 Time: SLP Start Time (ACUTE ONLY): 1025 SLP Stop Time (ACUTE ONLY): 1040 SLP Time Calculation (min) (ACUTE ONLY): 15 min  Past Medical History:  Past Medical History:  Diagnosis Date   Arthritis    Hypertension    Past Surgical History:  Past Surgical History:  Procedure Laterality Date   ABDOMINAL SURGERY     HPI:  Per H&P, "Mark Mcintosh is a 53 y.o. male with medical history significant for hypertension and tobacco use disorder being admitted for an acute stroke.  He was in his usual state of health until the day prior when leaving work he felt overall weak and had to be helped to his car.  He went home and awoke this morning still feeling weak but noted that he was more weak on the left side which prompted the visit to the emergency room." MRI 1. Acute infarcts in the posterior limb of the right internal  capsule, the overlying right frontal white matter, and pons. Given  involvement of multiple vascular territories, consider an embolic  etiology.  2. Multiple remote infarcts and chronic microvascular ischemic  disease    Assessment / Plan / Recommendation  Clinical Impression  Clinical swallow assessment completed in the setting of concern for dysphagia in the setting of acute CVA impacting posterior limb of the right internal capsule, the overlying right frontal white matter, and pons. Oral motor deficits notable for facial/labial weakness and lingual deviation (with intact lateral lingual movements). Pt seen with trials of breakfast (puree, regular, and thin liquids via straw), independent with self feeding. No overt or subtle s/sx pharyngeal dysphagia noted. No change to vocal quality across trials despite challenging with single and serial straw sips of thin liquid. Oral phase was notably slow in regards to mastication and clearance. Oral  phase also impacted by baseline reduced dentition. Pt impulsive with rate/bite size, leading to need for moderate verbal cues for slow, small bites and use of liquid wash to aid oral clearance. Education shared regarding rationale for aspiration precautions and increased risk for aspiration s/p acute CVA. Pt reported understanding.   Risk of aspiration in the setting of acute CVA (with pons involvement) with residual oral motor impairments. Risk is managed with aspiration precautions (slow rate, small bites, elevated HOB, and alert for PO intake). Intermittent supervision for ensured compliance with precautions. Continue with unrestricted diet. MD and RN aware of recommendations. SLP will continue to follow to address compensatory strategies, safety with intake, and educations.    SLP Visit Diagnosis: Dysphagia, oropharyngeal phase (R13.12) (concern for pharyngeal dysphagia secondary to involvement of pons)    Aspiration Risk  Moderate aspiration risk    Diet Recommendation   Age appropriate regular;Thin  Medication Administration: Whole meds with liquid    Other  Recommendations Oral Care Recommendations: Oral care BID;Other (Comment);Oral care before and after PO (staff to assist as needed)    Recommendations for follow up therapy are one component of a multi-disciplinary discharge planning process, led by the attending physician.  Recommendations may be updated based on patient status, additional functional criteria and insurance authorization.  Follow up Recommendations Follow physician's recommendations for discharge plan and follow up therapies      Assistance Recommended at Discharge  Int supervision for safety with PO intake  Functional Status Assessment Patient has had a recent decline in their functional status and demonstrates the ability to make significant  improvements in function in a reasonable and predictable amount of time.  Frequency and Duration min 2x/week  2 weeks        Prognosis Prognosis for improved oropharyngeal function: Good      Swallow Study   General Date of Onset: 01/10/24 HPI: Per H&P, "Mark Mcintosh is a 53 y.o. male with medical history significant for hypertension and tobacco use disorder being admitted for an acute stroke.  He was in his usual state of health until the day prior when leaving work he felt overall weak and had to be helped to his car.  He went home and awoke this morning still feeling weak but noted that he was more weak on the left side which prompted the visit to the emergency room." MRI 1. Acute infarcts in the posterior limb of the right internal  capsule, the overlying right frontal white matter, and pons. Given  involvement of multiple vascular territories, consider an embolic  etiology.  2. Multiple remote infarcts and chronic microvascular ischemic  disease Type of Study: Bedside Swallow Evaluation Previous Swallow Assessment: none in chart Diet Prior to this Study: Regular;Thin liquids (Level 0) Temperature Spikes Noted: No (WBC 5.6) Respiratory Status: Room air History of Recent Intubation: No Behavior/Cognition: Alert;Cooperative Oral Cavity Assessment: Within Functional Limits Oral Care Completed by SLP: Recent completion by staff Oral Cavity - Dentition: Missing dentition;Poor condition Vision: Functional for self-feeding Self-Feeding Abilities: Able to feed self (with extended time) Patient Positioning: Upright in bed Baseline Vocal Quality: Normal (min congestion at baseline) Volitional Cough: Strong    Oral/Motor/Sensory Function Overall Oral Motor/Sensory Function: Moderate impairment Facial ROM: Reduced left Facial Symmetry: Abnormal symmetry left Facial Strength: Reduced left Facial Sensation: Reduced left Lingual ROM: Reduced right (right sided deviation) Lingual Symmetry: Abnormal symmetry right Lingual Strength: Reduced Mandible: Within Functional Limits   Ice Chips Ice chips: Not tested    Thin Liquid Thin Liquid: Within functional limits Presentation: Straw    Nectar Thick Nectar Thick Liquid: Not tested   Honey Thick Honey Thick Liquid: Not tested   Puree Puree: Within functional limits Presentation: Self Fed;Spoon   Solid     Solid: Impaired Presentation: Self Fed Oral Phase Impairments: Impaired mastication Oral Phase Functional Implications: Impaired mastication;Prolonged oral transit Pharyngeal Phase Impairments:  (none)     [place] Natacia Chaisson Clapp, MS, CCC-SLP Speech Language Pathologist Rehab Services; [facility] [place] [phone] (ascom)   [place] J Clapp 01/10/2024,10:43 AM

## 2024-01-10 NOTE — Progress Notes (Signed)
 Progress Note    Mark Mcintosh  ZOX:[id number] DOB: [birth year]  DOA: 01/09/2024 PCP: Pcp, No      Brief Narrative:    Medical records reviewed and are as summarized below:  Mark Mcintosh is a 53 y.o. male with medical history significant for hypertension, tobacco use disorder, who presented to the hospital because of left-sided weakness.   He was found to have acute stroke.   Assessment/Plan:   Principal Problem:   Acute CVA (cerebrovascular accident) (HCC) Active Problems:   Tobacco use disorder   Hypertension   Obesity (BMI 30-39.9)    Body mass index is 36.26 kg/m.  Class II obesity   Acute stroke: Infarct seen in the posterior limb of the right internal capsule, overlying right frontal white matter and pons.  Embolic etiology is suspected given multiple vascular territories.  There is also evidence of multiple remote infarcts and chronic microvascular ischemic disease. 2D echo is pending CTA head and neck has been ordered for further evaluation. Lipid panel total cholesterol 185, triglyceride 117, HDL 42, LDL 120. Continue aspirin, and Plavix.  Start Lipitor Consulted neurologist, Dr. Doretta Gant, to assist with management.   Hypertension: Will allow permissive hypertensive for now. Hold amlodipine    Prediabetes: Hemoglobin A1c 5.7.  Discussed diagnosis and management.  Encouraged regular exercise, weight loss and healthy eating habits   Hypokalemia: Replete potassium and monitor level   Tobacco use disorder: Counseled to quit smoking cigarettes  Diet Order             Diet Heart Room service appropriate? Yes; Fluid consistency: Thin  Diet effective now                            Consultants: Neurologist  Procedures: None    Medications:    aspirin EC  81 mg Oral Daily   atorvastatin  20 mg Oral Daily   clopidogrel  75 mg Oral Daily   enoxaparin (LOVENOX) injection  0.5 mg/kg Subcutaneous Q24H   nicotine  21 mg  Transdermal Daily   Continuous Infusions:   Anti-infectives (From admission, onward)    None              Family Communication/Anticipated D/C date and plan/Code Status   DVT prophylaxis:      Code Status: Full Code  Family Communication: None Disposition Plan: Plan to discharge home   Status is: Observation The patient will require care spanning > 2 midnights and should be moved to inpatient because: Acute stroke, workup in progress       Subjective:   Interval events noted.  No new complaints.  He still has some left-sided weakness but is better than yesterday.  Objective:    Vitals:   01/09/24 2355 01/10/24 0351 01/10/24 0758 01/10/24 1223  BP: 99/79 134/86 (!) 127/95 (!) 137/115  Pulse: 80 76 79 84  Resp: 16 16 19 17   Temp: 98 F (36.7 C) 98 F (36.7 C) 98.8 F (37.1 C) 98.1 F (36.7 C)  TempSrc:   Oral   SpO2: 99% 96% 95% 100%  Weight:      Height:       No data found.  No intake or output data in the 24 hours ending 01/10/24 1309 Filed Weights   01/09/24 1730  Weight: 117.9 kg    Exam:  GEN: NAD SKIN: Warm and dry EYES: EOMI, PERRLA, EOMI, no gaze preference, no visual field cut ENT: MMM  CV: RRR PULM: CTA B ABD: soft, ND, NT, +BS CNS: Drowsy but arousable, oriented x 4, left facial droop, mild left hemiparesis (power 4+/5 in LUE, power 4/5 LLE) EXT: No edema or tenderness        Data Reviewed:   I have personally reviewed following labs and imaging studies:  Labs: Labs show the following:   Basic Metabolic Panel: Recent Labs  Lab 01/09/24 1741  NA 139  K 3.1*  CL 98  CO2 28  GLUCOSE 106*  BUN 8  CREATININE 1.00  CALCIUM 9.0   GFR Estimated Creatinine Clearance: 112.8 mL/min (by C-G formula based on SCr of 1 mg/dL). Liver Function Tests: Recent Labs  Lab 01/09/24 1741  AST 20  ALT 13  ALKPHOS 61  BILITOT 0.9  PROT 8.1  ALBUMIN 3.9   No results for input(s): "LIPASE", "AMYLASE" in the last 168  hours. No results for input(s): "AMMONIA" in the last 168 hours. Coagulation profile Recent Labs  Lab 01/09/24 1741  INR 1.1    CBC: Recent Labs  Lab 01/09/24 1741  WBC 5.6  NEUTROABS 3.5  HGB 14.3  HCT 44.5  MCV 94.1  PLT 277   Cardiac Enzymes: No results for input(s): "CKTOTAL", "CKMB", "CKMBINDEX", "TROPONINI" in the last 168 hours. BNP (last 3 results) No results for input(s): "PROBNP" in the last 8760 hours. CBG: Recent Labs  Lab 01/09/24 1749  GLUCAP 112*   D-Dimer: No results for input(s): "DDIMER" in the last 72 hours. Hgb A1c: Recent Labs    01/10/24 0533  HGBA1C 5.7*   Lipid Profile: Recent Labs    01/10/24 0533  CHOL 185  HDL 42  LDLCALC 120*  TRIG 117  CHOLHDL 4.4   Thyroid function studies: No results for input(s): "TSH", "T4TOTAL", "T3FREE", "THYROIDAB" in the last 72 hours.  Invalid input(s): "FREET3" Anemia work up: No results for input(s): "VITAMINB12", "FOLATE", "FERRITIN", "TIBC", "IRON", "RETICCTPCT" in the last 72 hours. Sepsis Labs: Recent Labs  Lab 01/09/24 1741  WBC 5.6    Microbiology No results found for this or any previous visit (from the past 240 hours).  Procedures and diagnostic studies:  MR BRAIN WO CONTRAST Result Date: 01/09/2024 CLINICAL DATA:  Neuro deficit, acute, stroke suspected left weakness EXAM: MRI HEAD WITHOUT CONTRAST TECHNIQUE: Multiplanar, multiecho pulse sequences of the brain and surrounding structures were obtained without intravenous contrast. COMPARISON:  CT head 02/26/2023. FINDINGS: Brain: Acute infarcts in the posterior limb of the right internal capsule and the overlying right frontal white matter. Additional acute infarct in the pons. Six remote infarct in the right frontal white matter, bilateral basal ganglia, and left periatrial white matter. Small remote infarct in the pons. Scattered T2/FLAIR hyperintensity white matter compatible with chronic microvascular ischemic disease. No evidence of  acute hemorrhage, mass lesion, midline shift or hydrocephalus. Vascular: Major arterial flow voids are maintained skull base. Skull and upper cervical spine: Normal marrow signal. Sinuses/Orbits: Negative. IMPRESSION: 1. Acute infarcts in the posterior limb of the right internal capsule, the overlying right frontal white matter, and pons. Given involvement of multiple vascular territories, consider an embolic etiology. 2. Multiple remote infarcts and chronic microvascular ischemic disease, detailed above. Electronically Signed   By: Stevenson Elbe M.D.   On: 01/09/2024 21:07               LOS: 0 days   Mark Mcintosh  [facility]   Pager on [email or web address]. If 7PM-7AM, please contact night-coverage at [email or web address]  01/10/2024, 1:09 PM

## 2024-01-10 NOTE — Evaluation (Signed)
 Physical Therapy Evaluation Patient Details Name: Mark Mcintosh MRN: [id number] DOB: 1970-08-30 Today's Date: 01/10/2024  History of Present Illness  presented to ER secondary to acute onset of L-sided weakness; admitted for TIA/CVA work-up.  MRI significant for acute infarcts to posterior limb of internal capsule, R frontal white matter and pons.  Clinical Impression  Patient resting in bed upon arrival to room; alert and oriented, follows commands and agreeable to participation with session.  Denies pain; does endorse generalized fatigue (did not sleep well overnight).  L UE > LE noted with mild/mod functional weakness and drift during functional activities; mild difficulty coordinating/attending to L hemi-body with divided attention/fatigue.  Currently requiring cga/min assist for sit/stand, standing balance and gait efforts (100') without assist device.  Demonstrates partially reciprocal stepping pattern with decreased L foot cleance, step height/length; mildly excessive weight shift to L, increased L drift/deviation with head turns and dynamic components. Does normalize L LE step height/length with cuing and focused attention/effort, but unable to maintain with divided attention  BERG score 34/56, indicative of dynamic balance activities, exaggerated with activities involving narrowed BOS or SLS.  Patient with fair awareness of deficits, often using UEs to recover/regain balance as necessary. Would benefit from skilled PT to address above deficits and promote optimal return to PLOF.; recommend post-acute PT follow up as indicated by interdisciplinary care team.          If plan is discharge home, recommend the following: A little help with walking and/or transfers;A little help with bathing/dressing/bathroom   Can travel by private vehicle        Equipment Recommendations    Recommendations for Other Services       Functional Status Assessment Patient has had a recent decline in  their functional status and demonstrates the ability to make significant improvements in function in a reasonable and predictable amount of time.     Precautions / Restrictions Precautions Precautions: Fall Restrictions Weight Bearing Restrictions Per Provider Order: No      Mobility  Bed Mobility Overal bed mobility: Modified Independent                  Transfers Overall transfer level: Needs assistance   Transfers: Sit to/from Stand Sit to Stand: Contact guard assist                Ambulation/Gait Ambulation/Gait assistance: Min assist Gait Distance (Feet): 100 Feet Assistive device: None         General Gait Details: partially reciprocal stepping pattern with decreased L foot cleance, step height/length; mildly excessive weight shift to L, increased L drift/deviation with head turns and dynamic components. Does normalize L LE step height/length with cuing and focused attention/effort, but unable to maintain with divided attention  Stairs            Wheelchair Mobility     Tilt Bed    Modified Rankin (Stroke Patients Only)       Balance Overall balance assessment: Needs assistance Sitting-balance support: No upper extremity supported, Feet supported Sitting balance-Leahy Scale: Good     Standing balance support: No upper extremity supported Standing balance-Leahy Scale: Fair                   Standardized Balance Assessment Standardized Balance Assessment : Berg Balance Test Berg Balance Test Sit to Stand: Able to stand without using hands and stabilize independently Standing Unsupported: Able to stand safely 2 minutes Sitting with Back Unsupported but Feet Supported on Floor or  Stool: Able to sit safely and securely 2 minutes Stand to Sit: Controls descent by using hands Transfers: Able to transfer safely, definite need of hands Standing Unsupported with Eyes Closed: Able to stand 10 seconds with supervision Standing  Ubsupported with Feet Together: Able to place feet together independently but unable to hold for 30 seconds From Standing, Reach Forward with Outstretched Arm: Reaches forward but needs supervision From Standing Position, Pick up Object from Floor: Able to pick up shoe, needs supervision From Standing Position, Turn to Look Behind Over each Shoulder: Looks behind one side only/other side shows less weight shift Turn 360 Degrees: Needs close supervision or verbal cueing Standing Unsupported, Alternately Place Feet on Step/Stool: Able to complete >2 steps/needs minimal assist Standing Unsupported, One Foot in Front: Needs help to step but can hold 15 seconds Standing on One Leg: Tries to lift leg/unable to hold 3 seconds but remains standing independently Total Score: 34         Pertinent Vitals/Pain Pain Assessment Pain Assessment: No/denies pain    Home Living Family/patient expects to be discharged to:: Private residence Living Arrangements: Alone   Type of Home: House Home Access: Stairs to enter   Secretary/administrator of Steps: 1   Home Layout: One level Home Equipment: None      Prior Function Prior Level of Function : Independent/Modified Independent;Working/employed;Driving                     Extremity/Trunk Assessment   Upper Extremity Assessment Upper Extremity Assessment: LUE deficits/detail;Right hand dominant LUE Deficits / Details: L UE grossly 4-/5, mild drift/inattention with bimanual tasks; denies sensory deficit    Lower Extremity Assessment Lower Extremity Assessment: LLE deficits/detail LLE Deficits / Details: grossly 4+/5; denies sensory deficit       Communication   Communication Communication: No apparent difficulties    Cognition Arousal: Alert Behavior During Therapy: WFL for tasks assessed/performed   PT - Cognitive impairments: No apparent impairments                         Following commands: Intact        Cueing       General Comments      Exercises     Assessment/Plan    PT Assessment Patient needs continued PT services  PT Problem List Decreased strength;Decreased range of motion;Decreased activity tolerance;Decreased balance;Decreased mobility;Decreased knowledge of use of DME;Decreased safety awareness;Decreased knowledge of precautions       PT Treatment Interventions DME instruction;Gait training;Stair training;Functional mobility training;Therapeutic activities;Therapeutic exercise;Balance training;Neuromuscular re-education;Patient/family education    PT Goals (Current goals can be found in the Care Plan section)  Acute Rehab PT Goals Patient Stated Goal: to take a nap PT Goal Formulation: With patient Time For Goal Achievement: 01/24/24 Potential to Achieve Goals: Good    Frequency Min 3X/week     Co-evaluation               AM-PAC PT "6 Clicks" Mobility  Outcome Measure Help needed turning from your back to your side while in a flat bed without using bedrails?: None Help needed moving from lying on your back to sitting on the side of a flat bed without using bedrails?: None Help needed moving to and from a bed to a chair (including a wheelchair)?: A Little Help needed standing up from a chair using your arms (e.g., wheelchair or bedside chair)?: A Little Help needed to walk in hospital  room?: A Little Help needed climbing 3-5 steps with a railing? : A Little 6 Click Score: 20    End of Session Equipment Utilized During Treatment: Gait belt Activity Tolerance: Patient tolerated treatment well Patient left: in bed;with call bell/phone within reach (OT present for evaluation) Nurse Communication: Mobility status PT Visit Diagnosis: Muscle weakness (generalized) (M62.81);Difficulty in walking, not elsewhere classified (R26.2)    Time: 3086-5784 PT Time Calculation (min) (ACUTE ONLY): 18 min   Charges:   PT Evaluation $PT Eval Moderate Complexity: 1  Mod   PT General Charges $$ ACUTE PT VISIT: 1 Visit       Lucynda Rosano H. Bevin Bucks, PT, DPT, NCS 01/10/24, 9:24 AM [phone]

## 2024-01-10 NOTE — Progress Notes (Signed)
 Patient received from the procedure via bed in stable condition.

## 2024-01-10 NOTE — Progress Notes (Signed)
 Occupational Therapy Evaluation Patient Details Name: Mark Mcintosh MRN: [id number] DOB: 12-14-1970 Today's Date: 01/10/2024   History of Present Illness   presented to ER secondary to acute onset of L-sided weakness; admitted for TIA/CVA work-up.  MRI significant for acute infarcts to posterior limb of internal capsule, R frontal white matter and pons.     Clinical Impressions Pt seen for OT evaluation this date. Prior to hospital admission, pt was independent in all aspects of ADL. 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 Pt lives alone in a one-level home with one steps to enter. Currently pt demonstrates impairments in LUE functional weakness, noted drift during task participation. Pt is R hand dominant and requires no physical assist for assist for seated ADL. CGA during transfers with no DME required. Pt amb with PT on arrival to room ~159ft with no DME. Pt is eager to return to his PLOF with increased independence and functional use of his left UE. No cognitive or visual deficits noted with assessment on this date. Pt left with ultrasound tech at bed side. Pt would benefit from skilled OT to address noted impairments and functional limitations (see below for any additional details) in order to maximize safety and independence while minimizing falls risk and caregiver burden. OT will follow acutely.       If plan is discharge home, recommend the following:   A little help with walking and/or transfers;A little help with bathing/dressing/bathroom;Assistance with cooking/housework;Assist for transportation;Help with stairs or ramp for entrance     Functional Status Assessment   Patient has had a recent decline in their functional status and demonstrates the ability to make significant improvements in function in a reasonable and predictable amount of time.     Equipment Recommendations   None recommended by OT     Recommendations for Other Services          Precautions/Restrictions   Precautions Precautions: Fall Restrictions Weight Bearing Restrictions Per Provider Order: No     Mobility Bed Mobility Overal bed mobility: Modified Independent                  Transfers Overall transfer level: Needs assistance   Transfers: Sit to/from Stand Sit to Stand: Contact guard assist           General transfer comment: Amb within room with PT on arrival      Balance Overall balance assessment: Needs assistance Sitting-balance support: No upper extremity supported, Feet supported Sitting balance-Leahy Scale: Good Sitting balance - Comments: Steady reaching within BOS   Standing balance support: No upper extremity supported Standing balance-Leahy Scale: Fair                             ADL either performed or assessed with clinical judgement   ADL Overall ADL's : Needs assistance/impaired                     Lower Body Dressing: Supervision/safety;Sit to/from stand Lower Body Dressing Details (indicate cue type and reason): Donning/doffing bilateral socks while seated on EOB Toilet Transfer: Ambulation;Contact guard assist Toilet Transfer Details (indicate cue type and reason): Simulated toilet t/f         Functional mobility during ADLs: Contact guard assist General ADL Comments: Completes seated ADL tasks with no physical assistance, standing grooming tasks anticipate CGA for safety     Vision Patient Visual Report: No change from baseline Vision Assessment?: Yes  Eye Alignment: Within Functional Limits Alignment/Gaze Preference: Within Defined Limits Tracking/Visual Pursuits: Able to track stimulus in all quads without difficulty Saccades: Within functional limits     Perception Perception: Within Functional Limits       Praxis Praxis: WFL       Pertinent Vitals/Pain Pain Assessment Pain Assessment: No/denies pain     Extremity/Trunk Assessment Upper Extremity  Assessment Upper Extremity Assessment: LUE deficits/detail;RUE deficits/detail RUE Deficits / Details: Reported numbess in finger tips LUE Deficits / Details: L UE grossly 4-/5, mild drift/inattention with bimanual tasks; denies sensory deficit LUE Sensation: WNL   Lower Extremity Assessment Lower Extremity Assessment: Defer to PT evaluation LLE Deficits / Details: grossly 4+/5; denies sensory deficit   Cervical / Trunk Assessment Cervical / Trunk Assessment: Normal   Communication Communication Communication: No apparent difficulties   Cognition Arousal: Alert Behavior During Therapy: WFL for tasks assessed/performed Cognition: No family/caregiver present to determine baseline             OT - Cognition Comments: A/Ox4                 Following commands: Intact       Cueing  General Comments   Cueing Techniques: Verbal cues  Pt reports baseline eye redness   Exercises Exercises: Other exercises Other Exercises Other Exercises: Edu: Role of OT, fall prevention, safe ADL completion   Shoulder Instructions      Home Living Family/patient expects to be discharged to:: Private residence Living Arrangements: Alone   Type of Home: House Home Access: Stairs to enter Secretary/administrator of Steps: 1   Home Layout: One level               Home Equipment: None          Prior Functioning/Environment Prior Level of Function : Independent/Modified Independent;Working/employed;Driving                    OT Problem List: Decreased strength;Decreased range of motion;Impaired balance (sitting and/or standing);Decreased activity tolerance;Decreased safety awareness;Decreased knowledge of use of DME or AE   OT Treatment/Interventions: Self-care/ADL training;Therapeutic exercise;Energy conservation;DME and/or AE instruction;Therapeutic activities;Cognitive remediation/compensation;Patient/family education;Balance training;Visual/perceptual  remediation/compensation      OT Goals(Current goals can be found in the care plan section)   Acute Rehab OT Goals Patient Stated Goal: Return to PLOF OT Goal Formulation: With patient Time For Goal Achievement: 01/24/24 Potential to Achieve Goals: Good ADL Goals Pt Will Perform Grooming: Independently;standing Pt Will Perform Lower Body Dressing: Independently Pt Will Transfer to Toilet: Independently;ambulating Pt Will Perform Toileting - Clothing Manipulation and hygiene: Independently;sit to/from stand   OT Frequency:  Min 3X/week    Co-evaluation              AM-PAC OT "6 Clicks" Daily Activity     Outcome Measure Help from another person eating meals?: None Help from another person taking care of personal grooming?: A Little Help from another person toileting, which includes using toliet, bedpan, or urinal?: A Little Help from another person bathing (including washing, rinsing, drying)?: A Little Help from another person to put on and taking off regular upper body clothing?: None Help from another person to put on and taking off regular lower body clothing?: None 6 Click Score: 21   End of Session Equipment Utilized During Treatment: Gait belt Nurse Communication: Mobility status  Activity Tolerance: Patient tolerated treatment well Patient left: in bed;Other (comment);with call bell/phone within reach (Ultrasound tech at bedside)  OT  Visit Diagnosis: Unsteadiness on feet (R26.81);Other abnormalities of gait and mobility (R26.89);Muscle weakness (generalized) (M62.81);Hemiplegia and hemiparesis Hemiplegia - Right/Left: Left Hemiplegia - dominant/non-dominant: Non-Dominant                Time: 1610-9604 OT Time Calculation (min): 12 min Charges:  OT General Charges $OT Visit: 1 Visit OT Evaluation $OT Eval Low Complexity: 1 Low  Danylah Holden M.S. OTR/L  01/10/24, 10:10 AM

## 2024-01-10 NOTE — Evaluation (Signed)
 Speech Language Pathology Evaluation Patient Details Name: Mark Mcintosh MRN: [id number] DOB: 1970/11/30 Today's Date: 01/10/2024 Time: 0800-0820 SLP Time Calculation (min) (ACUTE ONLY): 20 min  Problem List:  Patient Active Problem List   Diagnosis Date Noted   Acute CVA (cerebrovascular accident) (HCC) 01/09/2024   Tobacco use disorder 01/09/2024   Obesity (BMI 30-39.9) 01/09/2024   Hypertension    Past Medical History:  Past Medical History:  Diagnosis Date   Arthritis    Hypertension    Past Surgical History:  Past Surgical History:  Procedure Laterality Date   ABDOMINAL SURGERY     HPI:  Per H&P, "Mark Mcintosh is a 53 y.o. male with medical history significant for hypertension and tobacco use disorder being admitted for an acute stroke.  He was in his usual state of health until the day prior when leaving work he felt overall weak and had to be helped to his car.  He went home and awoke this morning still feeling weak but noted that he was more weak on the left side which prompted the visit to the emergency room." MRI 1. Acute infarcts in the posterior limb of the right internal  capsule, the overlying right frontal white matter, and pons. Given  involvement of multiple vascular territories, consider an embolic  etiology.  2. Multiple remote infarcts and chronic microvascular ischemic  disease   Assessment / Plan / Recommendation Clinical Impression  Pt seen for motor speech evaluation. Grossly mild dysarthria noted with impact on respiratory support and articulation precision. Intelligibility maintained at conversational level. Oral motor impairment notable for facial/labial/lingual strength. Verbal cues for increased articulation effort and repetition provided to aid overall articulation precision. Pt reporting that speech seems "sluggish," but denied additional cognitive linguistic concerns. Attention, language, orientation, awareness, and visual processing all grossly  intact. Education provided regarding impact of oral motor impact on speech and functional strengthening options (with use of muscles for speech and eating). Pt reported understanding. Recommend follow up motor speech intervention at discharge if deficits/concern persists. Pt reported understanding. SLP will continue to follow.     SLP Assessment  SLP Recommendation/Assessment: Patient needs continued Speech Lanaguage Pathology Services SLP Visit Diagnosis: Dysphagia, oropharyngeal phase (R13.12);Dysarthria and anarthria (R47.1)    Recommendations for follow up therapy are one component of a multi-disciplinary discharge planning process, led by the attending physician.  Recommendations may be updated based on patient status, additional functional criteria and insurance authorization.    Follow Up Recommendations  Follow physician's recommendations for discharge plan and follow up therapies    Assistance Recommended at Discharge   (pt able to make needs known)  Functional Status Assessment Patient has had a recent decline in their functional status and demonstrates the ability to make significant improvements in function in a reasonable and predictable amount of time.  Frequency and Duration min 2x/week  2 weeks      SLP Evaluation Cognition  Overall Cognitive Status: Within Functional Limits for tasks assessed (family not present to verify baseline) Arousal/Alertness: Awake/alert Orientation Level: Oriented X4 Attention: Sustained Sustained Attention: Appears intact Awareness: Appears intact Problem Solving: Appears intact Safety/Judgment: Appears intact       Comprehension  Auditory Comprehension Overall Auditory Comprehension: Appears within functional limits for tasks assessed Visual Recognition/Discrimination Discrimination: Within Function Limits Reading Comprehension Reading Status: Not tested    Expression Verbal Expression Overall Verbal Expression: Appears within  functional limits for tasks assessed Written Expression Dominant Hand: Right Written Expression: Not tested   Oral / Motor  Oral Motor/Sensory Function Overall Oral Motor/Sensory Function: Moderate impairment Facial ROM: Reduced left Facial Symmetry: Abnormal symmetry left Facial Strength: Reduced left Facial Sensation: Reduced left Lingual ROM: Reduced right (right sided deviation) Lingual Symmetry: Abnormal symmetry right Lingual Strength: Reduced Mandible: Within Functional Limits Motor Speech Overall Motor Speech: Impaired Respiration:  (noted intermittent labored breath pattern during speech) Phonation: Normal Resonance: Within functional limits Articulation: Impaired Level of Impairment: Conversation Intelligibility: Intelligible Motor Planning: Witnin functional limits Motor Speech Errors: Aware Effective Techniques: Slow rate;Over-articulate           [place] Necole Minassian Clapp, MS, CCC-SLP Speech Language Pathologist Rehab Services; [facility] [phone] (ascom)   [place] J Clapp 01/10/2024, 10:58 AM

## 2024-01-10 NOTE — Progress Notes (Signed)
*  PRELIMINARY RESULTS* Echocardiogram 2D Echocardiogram has been performed.  [place] Capers C Hildred Mollica 01/10/2024, 10:09 AM

## 2024-01-10 NOTE — Progress Notes (Signed)
 Patient went for procedure via bed in stable condition.

## 2024-01-11 ENCOUNTER — Observation Stay

## 2024-01-11 DIAGNOSIS — R29705 NIHSS score 5: Secondary | ICD-10-CM

## 2024-01-11 DIAGNOSIS — I10 Essential (primary) hypertension: Secondary | ICD-10-CM | POA: Diagnosis not present

## 2024-01-11 DIAGNOSIS — Q2112 Patent foramen ovale: Secondary | ICD-10-CM | POA: Diagnosis not present

## 2024-01-11 DIAGNOSIS — I6340 Cerebral infarction due to embolism of unspecified cerebral artery: Secondary | ICD-10-CM | POA: Diagnosis not present

## 2024-01-11 DIAGNOSIS — I639 Cerebral infarction, unspecified: Secondary | ICD-10-CM | POA: Diagnosis not present

## 2024-01-11 LAB — POTASSIUM: Potassium: 3.5 mmol/L (ref 3.5–5.1)

## 2024-01-11 MED ORDER — ATORVASTATIN CALCIUM 20 MG PO TABS
40.0000 mg | ORAL_TABLET | Freq: Every day | ORAL | Status: DC
  Administered 2024-01-12 – 2024-01-14 (×3): 40 mg via ORAL
  Filled 2024-01-11 (×3): qty 2

## 2024-01-11 MED ORDER — AMLODIPINE BESYLATE 5 MG PO TABS
5.0000 mg | ORAL_TABLET | Freq: Every day | ORAL | Status: DC
  Administered 2024-01-11 – 2024-01-13 (×3): 5 mg via ORAL
  Filled 2024-01-11 (×3): qty 1

## 2024-01-11 NOTE — Plan of Care (Signed)

## 2024-01-11 NOTE — Progress Notes (Signed)
 Patient received from procedure via bed in stable condition.

## 2024-01-11 NOTE — Consult Note (Signed)
 CARDIOLOGY CONSULT NOTE               Patient ID: Mark Mcintosh MRN: [id number] DOB/AGE: [birth year] 53 y.o.  Admit date: 01/09/2024 Referring Physician Dr. Brion Cancel hospitalist Primary Physician none Primary Cardiologist none Reason for Consultation possible shunt CVA  HPI: 53 year old male history of hypertension smoking COPD presented with acute stroke patient felt weak overall before that had not been followed by any physician.  Patient began feeling weak mostly on the left side which prompted him to come to emergency room.  Elevated blood pressure in the emergency room low potassium EKG was nonspecific with rightward axis MRI showed acute infarct in the posterior limb of the right internal capsule with multiple remote infarcts.  Denies any chest pain denies any previous cardiac history plan for further evaluation.  Surface echo suggested shots in our TEE is being recommended  Review of systems complete and found to be negative unless listed above     Past Medical History:  Diagnosis Date   Arthritis    Hypertension     Past Surgical History:  Procedure Laterality Date   ABDOMINAL SURGERY      Medications Prior to Admission  Medication Sig Dispense Refill Last Dose/Taking   albuterol  (VENTOLIN  HFA) 108 (90 Base) MCG/ACT inhaler Inhale 2 puffs into the lungs every 6 (six) hours as needed for wheezing or shortness of breath. 8 g 2 Unknown   amLODipine  (NORVASC ) 5 MG tablet Take 1 tablet (5 mg total) by mouth daily. 30 tablet 3 01/08/2024 Morning   prednisoLONE  acetate (PRED FORTE ) 1 % ophthalmic suspension Place 1 drop into both eyes 4 (four) times daily. 5 mL 0 01/09/2024   Social History   Socioeconomic History   Marital status: Single    Spouse name: Not on file   Number of children: Not on file   Years of education: Not on file   Highest education level: Not on file  Occupational History   Not on file  Tobacco Use   Smoking status: Every Day    Current  packs/day: 1.00    Types: Cigarettes, Cigars   Smokeless tobacco: Never  Vaping Use   Vaping status: Never Used  Substance and Sexual Activity   Alcohol use: Not Currently    Alcohol/week: 2.0 standard drinks of alcohol    Types: 2 Cans of beer per week   Drug use: Yes    Types: Marijuana   Sexual activity: Not on file  Other Topics Concern   Not on file  Social History Narrative   Not on file   Social Drivers of Health   Financial Resource Strain: Not on file  Food Insecurity: No Food Insecurity (01/10/2024)   Hunger Vital Sign    Worried About Running Out of Food in the Last Year: Never true    Ran Out of Food in the Last Year: Never true  Transportation Needs: No Transportation Needs (01/10/2024)   PRAPARE - Administrator, Civil Service (Medical): No    Lack of Transportation (Non-Medical): No  Physical Activity: Not on file  Stress: Not on file  Social Connections: Not on file  Intimate Partner Violence: Not At Risk (01/10/2024)   Humiliation, Afraid, Rape, and Kick questionnaire    Fear of Current or Ex-Partner: No    Emotionally Abused: No    Physically Abused: No    Sexually Abused: No    History reviewed. No pertinent family history.    Review of  systems complete and found to be negative unless listed above      PHYSICAL EXAM  General: Well developed, well nourished, in no acute distress HEENT:  Normocephalic and atramatic Neck:  No JVD.  Lungs: Clear bilaterally to auscultation and percussion. Heart: HRRR . Normal S1 and S2 without gallops or murmurs.  Abdomen: Bowel sounds are positive, abdomen soft and non-tender  Msk:  Back normal, normal gait. Normal strength and tone for age. Extremities: No clubbing, cyanosis or edema.   Neuro: Alert and oriented X 3. Psych:  Good affect, responds appropriately  Labs:   Lab Results  Component Value Date   WBC 5.6 01/09/2024   HGB 14.3 01/09/2024   HCT 44.5 01/09/2024   MCV 94.1 01/09/2024    PLT 277 01/09/2024    Recent Labs  Lab 01/09/24 1741  NA 139  K 3.1*  CL 98  CO2 28  BUN 8  CREATININE 1.00  CALCIUM 9.0  PROT 8.1  BILITOT 0.9  ALKPHOS 61  ALT 13  AST 20  GLUCOSE 106*   Lab Results  Component Value Date   TROPONINI <0.03 10/04/2017    Lab Results  Component Value Date   CHOL 185 01/10/2024   Lab Results  Component Value Date   HDL 42 01/10/2024   Lab Results  Component Value Date   LDLCALC 120 (H) 01/10/2024   Lab Results  Component Value Date   TRIG 117 01/10/2024   Lab Results  Component Value Date   CHOLHDL 4.4 01/10/2024   No results found for: "LDLDIRECT"    Radiology: US  Venous Img Lower Bilateral (DVT) Result Date: 01/11/2024 CLINICAL DATA:  Stroke EXAM: BILATERAL LOWER EXTREMITY VENOUS DOPPLER ULTRASOUND TECHNIQUE: Gray-scale sonography with compression, as well as color and duplex ultrasound, were performed to evaluate the deep venous system(s) from the level of the common femoral vein through the popliteal and proximal calf veins. COMPARISON:  None Available. FINDINGS: VENOUS Normal compressibility of the common femoral, superficial femoral, and popliteal veins, as well as the visualized calf veins. Visualized portions of profunda femoral vein and great saphenous vein unremarkable. No filling defects to suggest DVT on grayscale or color Doppler imaging. Doppler waveforms show normal direction of venous flow, normal respiratory plasticity and response to augmentation. OTHER None. Limitations: none IMPRESSION: Negative. Electronically Signed   By: Reagan Camera M.D.   On: 01/11/2024 11:09   ECHOCARDIOGRAM COMPLETE BUBBLE STUDY Result Date: 01/10/2024    ECHOCARDIOGRAM REPORT   Patient Name:   Mark Mcintosh Date of Exam: 01/10/2024 Medical Rec #:  [id number]         Height:       71.0 in Accession #:    [id number]        Weight:       260.0 lb Date of Birth:  02-Oct-1970         BSA:          2.357 m Patient Age:    53 years          BP:            127/95 mmHg Patient Gender: M                 HR:           72 bpm. Exam Location:  [facility] Procedure: 2D Echo, Cardiac Doppler, Color Doppler and Saline Contrast Bubble            Study (Both Spectral and Color Flow Doppler  were utilized during            procedure). Indications:     Stroke 434.91 / I63.9  History:         Patient has no prior history of Echocardiogram examinations.                  Risk Factors:Hypertension.  Sonographer:     Kathaleen Pale Roar Referring Phys:  [id number] Lanetta Pion Diagnosing Phys: Sheryle Donning MD IMPRESSIONS  1. Left ventricular ejection fraction, by estimation, is 55 to 60%. The left ventricle has normal function. The left ventricle has no regional wall motion abnormalities. Left ventricular diastolic parameters are indeterminate.  2. Right ventricular systolic function is normal. The right ventricular size is normal. There is mildly elevated pulmonary artery systolic pressure.  3. The mitral valve is normal in structure. Trivial mitral valve regurgitation. No evidence of mitral stenosis.  4. The aortic valve is normal in structure. Aortic valve regurgitation is trivial. No aortic stenosis is present.  5. The inferior vena cava is normal in size with greater than 50% respiratory variability, suggesting right atrial pressure of 3 mmHg.  6. Agitated saline contrast bubble study was positive with shunting observed after >6 cardiac cycles suggestive of intrapulmonary shunting. Comparison(s): No prior Echocardiogram. Conclusion(s)/Recommendation(s): Late positive bubble study, suggestive of extracardiac shunt. Septum appears aneurysmal, but there is no clear left to right shunting on available images. Consider TEE with bubble study if high suspicion for embolic source of CVA. FINDINGS  Left Ventricle: Left ventricular ejection fraction, by estimation, is 55 to 60%. The left ventricle has normal function. The left ventricle has no regional wall motion abnormalities. The left  ventricular internal cavity size was normal in size. There is  borderline left ventricular hypertrophy. Left ventricular diastolic parameters are indeterminate. Right Ventricle: The right ventricular size is normal. No increase in right ventricular wall thickness. Right ventricular systolic function is normal. There is mildly elevated pulmonary artery systolic pressure. The tricuspid regurgitant velocity is 2.95  m/s, and with an assumed right atrial pressure of 3 mmHg, the estimated right ventricular systolic pressure is 37.8 mmHg. Left Atrium: Left atrial size was normal in size. Right Atrium: Right atrial size was normal in size. Pericardium: There is no evidence of pericardial effusion. Mitral Valve: The mitral valve is normal in structure. Trivial mitral valve regurgitation. No evidence of mitral valve stenosis. MV peak gradient, 5.5 mmHg. The mean mitral valve gradient is 1.0 mmHg. Tricuspid Valve: The tricuspid valve is normal in structure. Tricuspid valve regurgitation is trivial. No evidence of tricuspid stenosis. Aortic Valve: The aortic valve is normal in structure. Aortic valve regurgitation is trivial. No aortic stenosis is present. Aortic valve mean gradient measures 3.0 mmHg. Aortic valve peak gradient measures 6.0 mmHg. Aortic valve area, by VTI measures 3.83 cm. Pulmonic Valve: The pulmonic valve was grossly normal. Pulmonic valve regurgitation is trivial. No evidence of pulmonic stenosis. Aorta: The aortic root and ascending aorta are structurally normal, with no evidence of dilitation. Ascending aorta measurements are within normal limits for age when indexed to body surface area. Venous: The inferior vena cava is normal in size with greater than 50% respiratory variability, suggesting right atrial pressure of 3 mmHg. IAS/Shunts: The interatrial septum is aneurysmal. The atrial septum is grossly normal. Agitated saline contrast was given intravenously to evaluate for intracardiac shunting.  Agitated saline contrast bubble study was positive with shunting observed after  >6 cardiac cycles suggestive of intrapulmonary shunting.  LEFT  VENTRICLE PLAX 2D LVIDd:         4.30 cm     Diastology LVIDs:         3.00 cm     LV e' medial:    8.16 cm/s LV PW:         1.20 cm     LV E/e' medial:  10.9 LV IVS:        1.20 cm     LV e' lateral:   8.70 cm/s LVOT diam:     2.10 cm     LV E/e' lateral: 10.2 LV SV:         69 LV SV Index:   29 LVOT Area:     3.46 cm  LV Volumes (MOD) LV vol d, MOD A2C: 81.8 ml LV vol d, MOD A4C: 80.1 ml LV vol s, MOD A2C: 34.4 ml LV vol s, MOD A4C: 35.7 ml LV SV MOD A2C:     47.4 ml LV SV MOD A4C:     80.1 ml LV SV MOD BP:      45.4 ml RIGHT VENTRICLE RV Basal diam:  3.60 cm RV Mid diam:    3.30 cm RV S prime:     11.90 cm/s TAPSE (M-mode): 2.6 cm LEFT ATRIUM             Index        RIGHT ATRIUM           Index LA diam:        3.90 cm 1.65 cm/m   RA Area:     18.50 cm LA Vol (A2C):   49.3 ml 20.91 ml/m  RA Volume:   49.20 ml  20.87 ml/m LA Vol (A4C):   56.7 ml 24.05 ml/m LA Biplane Vol: 53.2 ml 22.57 ml/m  AORTIC VALVE                    PULMONIC VALVE AV Area (Vmax):    3.35 cm     PV Vmax:          1.07 m/s AV Area (Vmean):   3.26 cm     PV Peak grad:     4.6 mmHg AV Area (VTI):     3.83 cm     PR End Diast Vel: 7.29 msec AV Vmax:           122.00 cm/s  RVOT Peak grad:   2 mmHg AV Vmean:          74.800 cm/s AV VTI:            0.181 m AV Peak Grad:      6.0 mmHg AV Mean Grad:      3.0 mmHg LVOT Vmax:         118.00 cm/s LVOT Vmean:        70.300 cm/s LVOT VTI:          0.200 m LVOT/AV VTI ratio: 1.10  AORTA Ao Root diam: 3.20 cm Ao Asc diam:  3.80 cm MITRAL VALVE                TRICUSPID VALVE MV Area (PHT): 4.49 cm     TR Peak grad:   34.8 mmHg MV Area VTI:   2.14 cm     TR Vmax:        295.00 cm/s MV Peak grad:  5.5 mmHg MV Mean grad:  1.0 mmHg     SHUNTS MV Vmax:  1.17 m/s     Systemic VTI:  0.20 m MV Vmean:      54.7 cm/s    Systemic Diam: 2.10 cm MV Decel Time:  169 msec MV E velocity: 88.60 cm/s MV A velocity: 112.00 cm/s MV E/A ratio:  0.79 MV A Prime:    12.6 cm/s Sheryle Donning MD Electronically signed by Sheryle Donning MD Signature Date/Time: 01/10/2024/6:49:32 PM    Final    CT ANGIO HEAD NECK W WO CM Result Date: 01/10/2024 EXAM: CT HEAD WITHOUT CTA HEAD AND NECK WITH AND WITHOUT 01/10/2024 01:39:58 PM TECHNIQUE: CTA of the head and neck was performed with and without the administration of intravenous contrast. Noncontrast CT of the head with reconstructed 2-D images are also provided for review. Multiplanar 2D and/or 3D reformatted images are provided for review. Automated exposure control, iterative reconstruction, and/or weight based adjustment of the mA/kV was utilized to reduce the radiation dose to as low as reasonably achievable. COMPARISON: 01/09/2024 CLINICAL HISTORY: Stroke, follow up. FINDINGS: CT HEAD: BRAIN AND VENTRICLES: A remote lacunar infarct is again noted in the genu of the left internal capsule and globus pallidus. Remote lacunar infarcts are present in the right corona radiata. Periventricular white matter disease is moderately advanced for age. The acute infarct involving the posterior limb of the right internal capsule is less well appreciated. No acute intracranial hemorrhage. No mass effect or midline shift. No extra-axial fluid collection. Gray-white differentiation is maintained. No hydrocephalus. ORBITS: No acute abnormality. SINUSES: No acute abnormality. SOFT TISSUES AND SKULL: No acute abnormality. CTA NECK: AORTIC ARCH AND ARCH VESSELS: No dissection or arterial injury. No significant stenosis of the brachiocephalic or subclavian arteries. CERVICAL CAROTID ARTERIES: No dissection, arterial injury, or hemodynamically significant stenosis by NASCET criteria. CERVICAL VERTEBRAL ARTERIES: The left vertebral artery is dominant. No dissection, arterial injury, or significant stenosis. VISUALIZED LUNGS AND MEDIASTINUM:  Centrilobular emphysematous changes are present. SOFT TISSUES: No acute abnormality. BONES: Mild degenerative changes are present in the lower cervical spine. Extensive dental disease is present. No focal osseous lesions are present. CTA HEAD: ANTERIOR CIRCULATION: No significant stenosis of the internal carotid arteries. No significant stenosis of the anterior cerebral arteries. No significant stenosis of the middle cerebral arteries. No aneurysm. POSTERIOR CIRCULATION: No significant stenosis of the posterior cerebral arteries. No significant stenosis of the basilar artery. No significant stenosis of the vertebral arteries. No aneurysm. OTHER: No dural venous sinus thrombosis on this non-dedicated study. IMPRESSION: 1. No acute intracranial hemorrhage or mass effect. 2. No large vessel occlusion, hemodynamically significant stenosis, or aneurysm in the head or neck. 3. Remote lacunar infarcts in the genu of the left internal capsule, globus pallidus, and right corona radiata. 4. Acute infarct involving the posterior limb of the right internal capsule is less well appreciated at CT. 5. Moderately advanced periventricular white matter disease for age. Electronically signed by: Audree Leas MD 01/10/2024 05:39 PM EDT RP Workstation: [id number]   MR BRAIN WO CONTRAST Result Date: 01/09/2024 CLINICAL DATA:  Neuro deficit, acute, stroke suspected left weakness EXAM: MRI HEAD WITHOUT CONTRAST TECHNIQUE: Multiplanar, multiecho pulse sequences of the brain and surrounding structures were obtained without intravenous contrast. COMPARISON:  CT head 02/26/2023. FINDINGS: Brain: Acute infarcts in the posterior limb of the right internal capsule and the overlying right frontal white matter. Additional acute infarct in the pons. Six remote infarct in the right frontal white matter, bilateral basal ganglia, and left periatrial white matter. Small remote infarct in the pons. Scattered T2/FLAIR hyperintensity  white matter  compatible with chronic microvascular ischemic disease. No evidence of acute hemorrhage, mass lesion, midline shift or hydrocephalus. Vascular: Major arterial flow voids are maintained skull base. Skull and upper cervical spine: Normal marrow signal. Sinuses/Orbits: Negative. IMPRESSION: 1. Acute infarcts in the posterior limb of the right internal capsule, the overlying right frontal white matter, and pons. Given involvement of multiple vascular territories, consider an embolic etiology. 2. Multiple remote infarcts and chronic microvascular ischemic disease, detailed above. Electronically Signed   By: Stevenson Elbe M.D.   On: 01/09/2024 21:07   CT Orbits Wo Contrast Result Date: 12/29/2023 CLINICAL DATA:  Bilateral eye pain and swelling. EXAM: CT ORBITS WITHOUT CONTRAST TECHNIQUE: Multidetector CT imaging of the orbits was performed using the standard protocol without intravenous contrast. Multiplanar CT image reconstructions were also generated. RADIATION DOSE REDUCTION: This exam was performed according to the departmental dose-optimization program which includes automated exposure control, adjustment of the mA and/or kV according to patient size and/or use of iterative reconstruction technique. COMPARISON:  None Available. FINDINGS: Orbits: No orbital mass or evidence of inflammation. Normal appearance of the globes, optic nerve-sheath complexes, extraocular muscles, orbital fat and lacrimal glands. Visible paranasal sinuses: Clear. Soft tissues: Normal. Osseous: No fracture or aggressive lesion. Limited intracranial: No acute or significant finding. IMPRESSION: Negative CT of the orbits. Electronically Signed   By: Virgle Grime M.D.   On: 12/29/2023 18:52    EKG: Normal sinus rhythm possible inferior right superior axis poor R wave progression rate of 90  ASSESSMENT AND PLAN:  Atrial shunt History of CVA Hyperlipidemia Hypertension Smoking COPD . Plan TEE for evaluation of possible  shunt versus patent PFO Continue current anticoagulant therapy for CVA Advised patient refrain from tobacco abuse Continue hypertension management control Aggressive statin therapy moderate to high-dose Inhalers as necessary for COPD consider pulmonary involvement Modest weight loss exercise portion control Proceed with TEE on Tuesday with Dr. Bob Burn    Signed: Antonette Batters MD, 01/11/2024, 12:27 PM

## 2024-01-11 NOTE — Discharge Instructions (Signed)
 Some PCP options in [place] area- not a comprehensive list  [facility]- [phone] [facility]- [phone] [facility]- [phone] [facility]- [phone] [facility]- [phone] [facility]- [phone]  or [facility] Physician Referral Line [phone]

## 2024-01-11 NOTE — TOC Initial Note (Signed)
 Transition of Care [facility]) - Initial/Assessment Note    Patient Details  Name: Mark Mcintosh MRN: [id number] Date of Birth: 1971-03-27  [facility] CM/SW Contact:    Alexandra Ice, RN Phone Number: 01/11/2024, 2:43 PM  Clinical Narrative:                 Patient lives alone, independent. No PCP, added PCP list to AVS. PT/OT recommends home health services, but patient has no PCP.        Patient Goals and CMS Choice            Expected Discharge Plan and Services                                              Prior Living Arrangements/Services                       Activities of Daily Living   ADL Screening (condition at time of admission) Independently performs ADLs?: Yes (appropriate for developmental age) Is the patient deaf or have difficulty hearing?: No Does the patient have difficulty seeing, even when wearing glasses/contacts?: No Does the patient have difficulty concentrating, remembering, or making decisions?: No  Permission Sought/Granted                  Emotional Assessment              Admission diagnosis:  Acute CVA (cerebrovascular accident) [facility]) [I63.9] Cerebrovascular accident (CVA), unspecified mechanism (HCC) [I63.9] Patient Active Problem List   Diagnosis Date Noted   Acute CVA (cerebrovascular accident) (HCC) 01/09/2024   Tobacco use disorder 01/09/2024   Obesity (BMI 30-39.9) 01/09/2024   Hypertension    PCP:  Pcp, No Pharmacy:   [facility] [address] ([address]), [place] - [address]) [place] [address] Phone: [phone] Fax: [phone]  [facility] [address] [place] - [address] [place] [address] Phone: [phone] Fax: [phone]     Social Drivers of Health (SDOH) Social History: SDOH Screenings   Food Insecurity: No Food Insecurity (01/10/2024)  Housing: Low Risk  (01/10/2024)   Transportation Needs: No Transportation Needs (01/10/2024)  Utilities: Not At Risk (01/10/2024)  Tobacco Use: High Risk (01/09/2024)   SDOH Interventions:     Readmission Risk Interventions     No data to display

## 2024-01-11 NOTE — Plan of Care (Signed)

## 2024-01-11 NOTE — Progress Notes (Signed)
Patient sent for procedure via bed in stable condition.

## 2024-01-11 NOTE — Consult Note (Addendum)
 NEUROLOGY CONSULT NOTE   Date of service: 01/10/24 Patient Name: Mark Mcintosh MRN:  [id number] DOB:  [birth year] Chief Complaint: embolic stroke Requesting Provider: Sheril Dines, MD  History of Present Illness   \Mark Mcintosh is a 53 y.o. male with hx of hypertension and tobacco use disorder admitted yesterday to [facility] for acute ischemic stroke.  He was in his usual state of health until the day prior to admission when he started to feel weak overall and had to be helped to the car.  He went home slept overnight and woke up this morning still feeling generally weak but noticing the left side was more weak than the right.  He therefore sought care in the emergency department.  MRI brain showed acute infarcts in the posterior limb of the right internal capsule, overlying right frontal white matter, and pons.  Given the involvement of multiple vascular territories embolic etiology is favored.  He also had multiple remote infarcts and chronic microvascular ischemic disease.  MRI was personally reviewed.  CTA head and neck showed no LVO and no hemodynamically significant stenosis of the head and neck. TTE did not show cardiac clot or other significant abnormalities. Bubble study was (+). LDL 120. A1c 5.7. He was not taking antiplatelet agents or statin prior to admission. Patient reports today that he is minimally stronger on the right side but still very far from baseline. He denies dizziness.  LKW: 2 days ago Modified rankin score: 0-Completely asymptomatic and back to baseline post- stroke IV Thrombolysis: no outside of window  NIHSS components Score: Comment  1a Level of Conscious 0[x]  1[]  2[]  3[]      1b LOC Questions 0[x]  1[]  2[]       1c LOC Commands 0[x]  1[]  2[]       2 Best Gaze 0[x]  1[]  2[]       3 Visual 0[x]  1[]  2[]  3[]      4 Facial Palsy 0[]  1[]  2[x]  3[]      5a Motor Arm - left 0[]  1[]  2[x]  3[]  4[]  UN[]    5b Motor Arm - Right 0[x]  1[]  2[]  3[]  4[]  UN[]    6a Motor Leg - Left 0[]   1[]  2[x]  3[]  4[]  UN[]    6b Motor Leg - Right 0[x]  1[]  2[]  3[]  4[]  UN[]    7 Limb Ataxia 0[x]  1[]  2[]  3[]  UN[]     8 Sensory 0[x]  1[]  2[]  UN[]      9 Best Language 0[x]  1[]  2[]  3[]      10 Dysarthria 0[]  1[]  2[x]  UN[]      11 Extinct. and Inattention 0[x]  1[]  2[]       TOTAL:  8   Any unchecked boxes represent a score of 0   ROS   Comprehensive ROS performed and pertinent positives documented in HPI    Past History   Past Medical History:  Diagnosis Date   Arthritis    Hypertension     Past Surgical History:  Procedure Laterality Date   ABDOMINAL SURGERY      Family History: History reviewed. No pertinent family history.  Social History  reports that he has been smoking cigarettes and cigars. He has never used smokeless tobacco. He reports that he does not currently use alcohol after a past usage of about 2.0 standard drinks of alcohol per week. He reports current drug use. Drug: Marijuana.  No Known Allergies  Medications   Current Facility-Administered Medications:    acetaminophen  (TYLENOL ) tablet 650 mg, 650 mg, Oral, Q4H PRN **OR** acetaminophen  (TYLENOL ) 160 MG/5ML solution 650 mg, 650 mg,  Per Tube, Q4H PRN **OR** acetaminophen  (TYLENOL ) suppository 650 mg, 650 mg, Rectal, Q4H PRN, Lanetta Pion, MD   amLODipine  (NORVASC ) tablet 5 mg, 5 mg, Oral, Daily, Sheril Dines, MD, 5 mg at 01/11/24 0856   aspirin EC tablet 81 mg, 81 mg, Oral, Daily, Lanetta Pion, MD, 81 mg at 01/11/24 0857   atorvastatin (LIPITOR) tablet 20 mg, 20 mg, Oral, Daily, Ayiku, Bernard, MD, 20 mg at 01/11/24 0857   clopidogrel (PLAVIX) tablet 75 mg, 75 mg, Oral, Daily, Brion Cancel V, MD, 75 mg at 01/11/24 0856   enoxaparin (LOVENOX) injection 60 mg, 0.5 mg/kg, Subcutaneous, Q24H, Lanetta Pion, MD, 60 mg at 01/10/24 2115   hydrALAZINE (APRESOLINE) tablet 25 mg, 25 mg, Oral, Q6H PRN, Ayiku, Bernard, MD   nicotine (NICODERM CQ - dosed in mg/24 hours) patch 21 mg, 21 mg, Transdermal, Daily, Brion Cancel V, MD, 21 mg at 01/11/24 0859  Vitals   Vitals:   01/10/24 2052 01/10/24 2355 01/11/24 0405 01/11/24 0854  BP: (!) 137/110 (!) 134/104 (!) 162/92 (!) 151/105  Pulse: 75 69 73 86  Resp: 20 18 20 19   Temp: 98.4 F (36.9 C) 97.7 F (36.5 C) 98.9 F (37.2 C) 98.7 F (37.1 C)  TempSrc:    Oral  SpO2: 100%  100% 97%  Weight:      Height:        Body mass index is 36.26 kg/m.  Physical Exam   Gen: patient lying in bed, NAD CV: extremities appear well-perfused Resp: normal WOB  Neurologic Examination   MS: alert, oriented x4, follows commands Speech: moderate dysarthria, no aphasia CN: PERRL, VFF, EOMI, sensation intact, severe L lower facial droop, hearing intact to voice Motor: Drift to bed LUE and LLE Sensory: SILT Reflexes: 2+ symm with toes down bilat Coordination: intact on R Gait: deferred  Labs/Imaging/Neurodiagnostic studies   CBC:  Recent Labs  Lab 01/27/2024 1741  WBC 5.6  NEUTROABS 3.5  HGB 14.3  HCT 44.5  MCV 94.1  PLT 277   Basic Metabolic Panel:  Lab Results  Component Value Date   NA 139 2024/01/27   K 3.1 (L) 01-27-2024   CO2 28 01-27-2024   GLUCOSE 106 (H) 01-27-2024   BUN 8 27-Jan-2024   CREATININE 1.00 01/27/2024   CALCIUM 9.0 01-27-24   GFRNONAA >60 01/27/24   GFRAA >60 11/05/2019   Lipid Panel:  Lab Results  Component Value Date   LDLCALC 120 (H) 01/10/2024   HgbA1c:  Lab Results  Component Value Date   HGBA1C 5.7 (H) 01/10/2024   Urine Drug Screen: No results found for: "LABOPIA", "COCAINSCRNUR", "LABBENZ", "AMPHETMU", "THCU", "LABBARB"  Alcohol Level     Component Value Date/Time   ETH <15 27-Jan-2024 1741   INR  Lab Results  Component Value Date   INR 1.1 01-27-2024   APTT  Lab Results  Component Value Date   APTT 30 01/27/24   AED levels: No results found for: "PHENYTOIN", "ZONISAMIDE", "LAMOTRIGINE", "LEVETIRACETA"  CT head CT angio Head and Neck with contrast(Personally reviewed): 1. No acute  intracranial hemorrhage or mass effect. 2. No large vessel occlusion, hemodynamically significant stenosis, or aneurysm in the head or neck. 3. Remote lacunar infarcts in the genu of the left internal capsule, globus pallidus, and right corona radiata. 4. Acute infarct involving the posterior limb of the right internal capsule is less well appreciated at CT. 5. Moderately advanced periventricular white matter disease for age.  MRI Brain(Personally reviewed): 1. Acute  infarcts in the posterior limb of the right internal capsule, the overlying right frontal white matter, and pons. Given involvement of multiple vascular territories, consider an embolic etiology. 2. Multiple remote infarcts and chronic microvascular ischemic disease, detailed above.  TTE 1. Left ventricular ejection fraction, by estimation, is 55 to 60% . The left ventricle has normal function. The left ventricle has no regional wall motion abnormalities. Left ventricular diastolic parameters are indeterminate. 2. Right ventricular systolic function is normal. The right ventricular size is normal. There is mildly elevated pulmonary artery systolic pressure. 3. The mitral valve is normal in structure. Trivial mitral valve regurgitation. No evidence of mitral stenosis. 4. The aortic valve is normal in structure. Aortic valve regurgitation is trivial. No aortic stenosis is present. 5. The inferior vena cava is normal in size with greater than 50% respiratory variability, suggesting right atrial pressure of 3 mmHg. 6. Agitated saline contrast bubble study was positive with shunting observed after > 6 cardiac cycles suggestive of intrapulmonary shunting.  Stroke Labs     Component Value Date/Time   CHOL 185 01/10/2024 0533   TRIG 117 01/10/2024 0533   HDL 42 01/10/2024 0533   CHOLHDL 4.4 01/10/2024 0533   VLDL 23 01/10/2024 0533   LDLCALC 120 (H) 01/10/2024 0533    Lab Results  Component Value Date/Time   HGBA1C 5.7 (H)  01/10/2024 05:33 AM    ASSESSMENT   Emmanuel Gruenhagen is a 53 y.o. male with hx of hypertension and tobacco use disorder who presented to ED yesterday for acute onset L sided weakness. Patient was not a candidate for TNK 2/2 presentation outside the window, and was not a candidate for intervention due to no LVO.  Patient has multiple acute infarcts in both anterior and posterior circulations suggesting central embolic etiology.  TTE performed today did not show intracardiac clot.  Recommend consultation to cardiology for TEE for further evaluation.  Bubble study was positive and will order bilateral lower extremity ultrasounds to rule out DVT which could have potentially caused stroke via paradoxical embolism.  RECOMMENDATIONS   - Consult to cardiology for TEE; discussed with patient, he is amenable - BLE US  r/o DVT in setting of known PFO - No indication for permissive HTN >48 hrs after sx onset - ASA 81mg  daily + plavix 75mg  daily x21 days f/b ASA 81mg  daily monotherapy after that. If DVT or intracardiac clot is identified and patient is started on therapeutic anticoagulation, there is no neurologic indication to continue the antiplatelets. - Increase atorvastatin to 40mg  daily for goal LDL <70 - q4 hr neuro checks - STAT head CT for any change in neuro exam - Tele - PT/OT/SLP - Stroke education - Amb referral to neurology upon discharge - Will continue to follow ______________________________________________________________________    Signed, Eleni Griffin, MD [facility]

## 2024-01-11 NOTE — Progress Notes (Signed)
 PT Cancellation Note  Patient Details Name: Mark Mcintosh MRN: [id number] DOB: 13-Nov-1970   Cancelled Treatment:    Reason Eval/Treat Not Completed: Fatigue/lethargy limiting ability to participate (Treatment session attempted.  Patient sleeping soundly.  Open eyes and verbally responds to direct cuing, but unable to maintain for adequate participation with session.  Will re-attempt at later time/date as appropriate.)   Raydan Schlabach H. Bevin Bucks, PT, DPT, NCS 01/11/24, 2:28 PM [phone]

## 2024-01-11 NOTE — Progress Notes (Addendum)
 Progress Note    Mark Mcintosh  ZOX:[id number] DOB: 07-21-1971  DOA: 01/09/2024 PCP: Pcp, No      Brief Narrative:    Medical records reviewed and are as summarized below:  Mark Mcintosh is a 53 y.o. male with medical history significant for hypertension, tobacco use disorder, who presented to the hospital because of left-sided weakness.   He was found to have acute stroke.   Assessment/Plan:   Principal Problem:   Acute CVA (cerebrovascular accident) (HCC) Active Problems:   Tobacco use disorder   Hypertension   Obesity (BMI 30-39.9)    Body mass index is 36.26 kg/m.  Class II obesity   Acute stroke: Infarct seen in the posterior limb of the right internal capsule, overlying right frontal white matter and pons.  Embolic etiology is suspected given multiple vascular territories.  There is also evidence of multiple remote infarcts and chronic microvascular ischemic disease. 2D echo showed EF estimated at 55 to 60%, indeterminate LV diastolic parameters, mildly elevated pulmonary artery systolic pressure, positive bubble study suggestive of extracardiac shunt/intrapulmonary shunt CTA head and neck did not show any large vessel occlusion. Venous duplex of bilateral lower extremity did not show any DVT. Lipid panel total cholesterol 185, triglyceride 117, HDL 42, LDL 120. Continue aspirin, Plavix and Lipitor. Case discussed with Dr. Doretta Gant, neurologist.  Patient will need TEE. Consulted Dr. Beau Bound, cardiologist, for evaluation for TEE.   Hypertension: Resume home amlodipine    Prediabetes: Hemoglobin A1c 5.7.  Discussed diagnosis and management.  Encouraged regular exercise, weight loss and healthy eating habits   Hypokalemia: Repeat potassium level   Tobacco use disorder: Counseled to quit smoking cigarettes  Diet Order             Diet Heart Room service appropriate? Yes; Fluid consistency: Thin  Diet effective now                             Consultants: Neurologist Cardiologist  Procedures: None    Medications:    amLODipine   5 mg Oral Daily   aspirin EC  81 mg Oral Daily   [START ON 01/12/2024] atorvastatin  40 mg Oral Daily   clopidogrel  75 mg Oral Daily   enoxaparin (LOVENOX) injection  0.5 mg/kg Subcutaneous Q24H   nicotine  21 mg Transdermal Daily   Continuous Infusions:   Anti-infectives (From admission, onward)    None              Family Communication/Anticipated D/C date and plan/Code Status   DVT prophylaxis:      Code Status: Full Code  Family Communication: None Disposition Plan: Plan to discharge home   Status is: Observation The patient will require care spanning > 2 midnights and should be moved to inpatient because: Acute stroke, workup in progress       Subjective:   Interval events noted.  No complaints.  Left-sided weakness feels better.  Objective:    Vitals:   01/10/24 2355 01/11/24 0405 01/11/24 0854 01/11/24 1251  BP: (!) 134/104 (!) 162/92 (!) 151/105 (!) 138/104  Pulse: 69 73 86 89  Resp: 18 20 19 16   Temp: 97.7 F (36.5 C) 98.9 F (37.2 C) 98.7 F (37.1 C) 98.4 F (36.9 C)  TempSrc:   Oral Oral  SpO2:  100% 97% 96%  Weight:      Height:       No data found.  No intake or  output data in the 24 hours ending 01/11/24 1359 Filed Weights   01/09/24 1730  Weight: 117.9 kg    Exam:  GEN: NAD SKIN: Warm and dry EYES: No pallor or icterus ENT: MMM CV: RRR PULM: CTA B ABD: soft, ND, NT, +BS CNS: AAO x 3, speech is slightly slurred, left facial droop otherwise no gross focal deficits noted. EXT: No edema or tenderness      Data Reviewed:   I have personally reviewed following labs and imaging studies:  Labs: Labs show the following:   Basic Metabolic Panel: Recent Labs  Lab 01/09/24 1741  NA 139  K 3.1*  CL 98  CO2 28  GLUCOSE 106*  BUN 8  CREATININE 1.00  CALCIUM 9.0   GFR Estimated Creatinine  Clearance: 112.8 mL/min (by C-G formula based on SCr of 1 mg/dL). Liver Function Tests: Recent Labs  Lab 01/09/24 1741  AST 20  ALT 13  ALKPHOS 61  BILITOT 0.9  PROT 8.1  ALBUMIN 3.9   No results for input(s): "LIPASE", "AMYLASE" in the last 168 hours. No results for input(s): "AMMONIA" in the last 168 hours. Coagulation profile Recent Labs  Lab 01/09/24 1741  INR 1.1    CBC: Recent Labs  Lab 01/09/24 1741  WBC 5.6  NEUTROABS 3.5  HGB 14.3  HCT 44.5  MCV 94.1  PLT 277   Cardiac Enzymes: No results for input(s): "CKTOTAL", "CKMB", "CKMBINDEX", "TROPONINI" in the last 168 hours. BNP (last 3 results) No results for input(s): "PROBNP" in the last 8760 hours. CBG: Recent Labs  Lab 01/09/24 1749  GLUCAP 112*   D-Dimer: No results for input(s): "DDIMER" in the last 72 hours. Hgb A1c: Recent Labs    01/10/24 0533  HGBA1C 5.7*   Lipid Profile: Recent Labs    01/10/24 0533  CHOL 185  HDL 42  LDLCALC 120*  TRIG 117  CHOLHDL 4.4   Thyroid function studies: No results for input(s): "TSH", "T4TOTAL", "T3FREE", "THYROIDAB" in the last 72 hours.  Invalid input(s): "FREET3" Anemia work up: No results for input(s): "VITAMINB12", "FOLATE", "FERRITIN", "TIBC", "IRON", "RETICCTPCT" in the last 72 hours. Sepsis Labs: Recent Labs  Lab 01/09/24 1741  WBC 5.6    Microbiology No results found for this or any previous visit (from the past 240 hours).  Procedures and diagnostic studies:  US  Venous Img Lower Bilateral (DVT) Result Date: 01/11/2024 CLINICAL DATA:  Stroke EXAM: BILATERAL LOWER EXTREMITY VENOUS DOPPLER ULTRASOUND TECHNIQUE: Gray-scale sonography with compression, as well as color and duplex ultrasound, were performed to evaluate the deep venous system(s) from the level of the common femoral vein through the popliteal and proximal calf veins. COMPARISON:  None Available. FINDINGS: VENOUS Normal compressibility of the common femoral, superficial femoral,  and popliteal veins, as well as the visualized calf veins. Visualized portions of profunda femoral vein and great saphenous vein unremarkable. No filling defects to suggest DVT on grayscale or color Doppler imaging. Doppler waveforms show normal direction of venous flow, normal respiratory plasticity and response to augmentation. OTHER None. Limitations: none IMPRESSION: Negative. Electronically Signed   By: Reagan Camera M.D.   On: 01/11/2024 11:09   ECHOCARDIOGRAM COMPLETE BUBBLE STUDY Result Date: 01/10/2024    ECHOCARDIOGRAM REPORT   Patient Name:   Mark Mcintosh Date of Exam: 01/10/2024 Medical Rec #:  [id number]         Height:       71.0 in Accession #:    [id number]  Weight:       260.0 lb Date of Birth:  [birth year]         BSA:          2.357 m Patient Age:    53 years          BP:           127/95 mmHg Patient Gender: M                 HR:           72 bpm. Exam Location:  [facility] Procedure: 2D Echo, Cardiac Doppler, Color Doppler and Saline Contrast Bubble            Study (Both Spectral and Color Flow Doppler were utilized during            procedure). Indications:     Stroke 434.91 / I63.9  History:         Patient has no prior history of Echocardiogram examinations.                  Risk Factors:Hypertension.  Sonographer:     Kathaleen Pale Roar Referring Phys:  [id number] Lanetta Pion Diagnosing Phys: Sheryle Donning MD IMPRESSIONS  1. Left ventricular ejection fraction, by estimation, is 55 to 60%. The left ventricle has normal function. The left ventricle has no regional wall motion abnormalities. Left ventricular diastolic parameters are indeterminate.  2. Right ventricular systolic function is normal. The right ventricular size is normal. There is mildly elevated pulmonary artery systolic pressure.  3. The mitral valve is normal in structure. Trivial mitral valve regurgitation. No evidence of mitral stenosis.  4. The aortic valve is normal in structure. Aortic valve regurgitation is trivial.  No aortic stenosis is present.  5. The inferior vena cava is normal in size with greater than 50% respiratory variability, suggesting right atrial pressure of 3 mmHg.  6. Agitated saline contrast bubble study was positive with shunting observed after >6 cardiac cycles suggestive of intrapulmonary shunting. Comparison(s): No prior Echocardiogram. Conclusion(s)/Recommendation(s): Late positive bubble study, suggestive of extracardiac shunt. Septum appears aneurysmal, but there is no clear left to right shunting on available images. Consider TEE with bubble study if high suspicion for embolic source of CVA. FINDINGS  Left Ventricle: Left ventricular ejection fraction, by estimation, is 55 to 60%. The left ventricle has normal function. The left ventricle has no regional wall motion abnormalities. The left ventricular internal cavity size was normal in size. There is  borderline left ventricular hypertrophy. Left ventricular diastolic parameters are indeterminate. Right Ventricle: The right ventricular size is normal. No increase in right ventricular wall thickness. Right ventricular systolic function is normal. There is mildly elevated pulmonary artery systolic pressure. The tricuspid regurgitant velocity is 2.95  m/s, and with an assumed right atrial pressure of 3 mmHg, the estimated right ventricular systolic pressure is 37.8 mmHg. Left Atrium: Left atrial size was normal in size. Right Atrium: Right atrial size was normal in size. Pericardium: There is no evidence of pericardial effusion. Mitral Valve: The mitral valve is normal in structure. Trivial mitral valve regurgitation. No evidence of mitral valve stenosis. MV peak gradient, 5.5 mmHg. The mean mitral valve gradient is 1.0 mmHg. Tricuspid Valve: The tricuspid valve is normal in structure. Tricuspid valve regurgitation is trivial. No evidence of tricuspid stenosis. Aortic Valve: The aortic valve is normal in structure. Aortic valve regurgitation is trivial. No  aortic stenosis is present. Aortic valve mean gradient measures 3.0 mmHg. Aortic  valve peak gradient measures 6.0 mmHg. Aortic valve area, by VTI measures 3.83 cm. Pulmonic Valve: The pulmonic valve was grossly normal. Pulmonic valve regurgitation is trivial. No evidence of pulmonic stenosis. Aorta: The aortic root and ascending aorta are structurally normal, with no evidence of dilitation. Ascending aorta measurements are within normal limits for age when indexed to body surface area. Venous: The inferior vena cava is normal in size with greater than 50% respiratory variability, suggesting right atrial pressure of 3 mmHg. IAS/Shunts: The interatrial septum is aneurysmal. The atrial septum is grossly normal. Agitated saline contrast was given intravenously to evaluate for intracardiac shunting. Agitated saline contrast bubble study was positive with shunting observed after  >6 cardiac cycles suggestive of intrapulmonary shunting.  LEFT VENTRICLE PLAX 2D LVIDd:         4.30 cm     Diastology LVIDs:         3.00 cm     LV e' medial:    8.16 cm/s LV PW:         1.20 cm     LV E/e' medial:  10.9 LV IVS:        1.20 cm     LV e' lateral:   8.70 cm/s LVOT diam:     2.10 cm     LV E/e' lateral: 10.2 LV SV:         69 LV SV Index:   29 LVOT Area:     3.46 cm  LV Volumes (MOD) LV vol d, MOD A2C: 81.8 ml LV vol d, MOD A4C: 80.1 ml LV vol s, MOD A2C: 34.4 ml LV vol s, MOD A4C: 35.7 ml LV SV MOD A2C:     47.4 ml LV SV MOD A4C:     80.1 ml LV SV MOD BP:      45.4 ml RIGHT VENTRICLE RV Basal diam:  3.60 cm RV Mid diam:    3.30 cm RV S prime:     11.90 cm/s TAPSE (M-mode): 2.6 cm LEFT ATRIUM             Index        RIGHT ATRIUM           Index LA diam:        3.90 cm 1.65 cm/m   RA Area:     18.50 cm LA Vol (A2C):   49.3 ml 20.91 ml/m  RA Volume:   49.20 ml  20.87 ml/m LA Vol (A4C):   56.7 ml 24.05 ml/m LA Biplane Vol: 53.2 ml 22.57 ml/m  AORTIC VALVE                    PULMONIC VALVE AV Area (Vmax):    3.35 cm     PV  Vmax:          1.07 m/s AV Area (Vmean):   3.26 cm     PV Peak grad:     4.6 mmHg AV Area (VTI):     3.83 cm     PR End Diast Vel: 7.29 msec AV Vmax:           122.00 cm/s  RVOT Peak grad:   2 mmHg AV Vmean:          74.800 cm/s AV VTI:            0.181 m AV Peak Grad:      6.0 mmHg AV Mean Grad:      3.0 mmHg LVOT Vmax:  118.00 cm/s LVOT Vmean:        70.300 cm/s LVOT VTI:          0.200 m LVOT/AV VTI ratio: 1.10  AORTA Ao Root diam: 3.20 cm Ao Asc diam:  3.80 cm MITRAL VALVE                TRICUSPID VALVE MV Area (PHT): 4.49 cm     TR Peak grad:   34.8 mmHg MV Area VTI:   2.14 cm     TR Vmax:        295.00 cm/s MV Peak grad:  5.5 mmHg MV Mean grad:  1.0 mmHg     SHUNTS MV Vmax:       1.17 m/s     Systemic VTI:  0.20 m MV Vmean:      54.7 cm/s    Systemic Diam: 2.10 cm MV Decel Time: 169 msec MV E velocity: 88.60 cm/s MV A velocity: 112.00 cm/s MV E/A ratio:  0.79 MV A Prime:    12.6 cm/s Sheryle Donning MD Electronically signed by Sheryle Donning MD Signature Date/Time: 01/10/2024/6:49:32 PM    Final    CT ANGIO HEAD NECK W WO CM Result Date: 01/10/2024 EXAM: CT HEAD WITHOUT CTA HEAD AND NECK WITH AND WITHOUT 01/10/2024 01:39:58 PM TECHNIQUE: CTA of the head and neck was performed with and without the administration of intravenous contrast. Noncontrast CT of the head with reconstructed 2-D images are also provided for review. Multiplanar 2D and/or 3D reformatted images are provided for review. Automated exposure control, iterative reconstruction, and/or weight based adjustment of the mA/kV was utilized to reduce the radiation dose to as low as reasonably achievable. COMPARISON: 01/09/2024 CLINICAL HISTORY: Stroke, follow up. FINDINGS: CT HEAD: BRAIN AND VENTRICLES: A remote lacunar infarct is again noted in the genu of the left internal capsule and globus pallidus. Remote lacunar infarcts are present in the right corona radiata. Periventricular white matter disease is moderately advanced  for age. The acute infarct involving the posterior limb of the right internal capsule is less well appreciated. No acute intracranial hemorrhage. No mass effect or midline shift. No extra-axial fluid collection. Gray-white differentiation is maintained. No hydrocephalus. ORBITS: No acute abnormality. SINUSES: No acute abnormality. SOFT TISSUES AND SKULL: No acute abnormality. CTA NECK: AORTIC ARCH AND ARCH VESSELS: No dissection or arterial injury. No significant stenosis of the brachiocephalic or subclavian arteries. CERVICAL CAROTID ARTERIES: No dissection, arterial injury, or hemodynamically significant stenosis by NASCET criteria. CERVICAL VERTEBRAL ARTERIES: The left vertebral artery is dominant. No dissection, arterial injury, or significant stenosis. VISUALIZED LUNGS AND MEDIASTINUM: Centrilobular emphysematous changes are present. SOFT TISSUES: No acute abnormality. BONES: Mild degenerative changes are present in the lower cervical spine. Extensive dental disease is present. No focal osseous lesions are present. CTA HEAD: ANTERIOR CIRCULATION: No significant stenosis of the internal carotid arteries. No significant stenosis of the anterior cerebral arteries. No significant stenosis of the middle cerebral arteries. No aneurysm. POSTERIOR CIRCULATION: No significant stenosis of the posterior cerebral arteries. No significant stenosis of the basilar artery. No significant stenosis of the vertebral arteries. No aneurysm. OTHER: No dural venous sinus thrombosis on this non-dedicated study. IMPRESSION: 1. No acute intracranial hemorrhage or mass effect. 2. No large vessel occlusion, hemodynamically significant stenosis, or aneurysm in the head or neck. 3. Remote lacunar infarcts in the genu of the left internal capsule, globus pallidus, and right corona radiata. 4. Acute infarct involving the posterior limb of the right internal capsule is  less well appreciated at CT. 5. Moderately advanced periventricular white  matter disease for age. Electronically signed by: Audree Leas MD 01/10/2024 05:39 PM EDT RP Workstation: [id number]   MR BRAIN WO CONTRAST Result Date: 01/09/2024 CLINICAL DATA:  Neuro deficit, acute, stroke suspected left weakness EXAM: MRI HEAD WITHOUT CONTRAST TECHNIQUE: Multiplanar, multiecho pulse sequences of the brain and surrounding structures were obtained without intravenous contrast. COMPARISON:  CT head 02/26/2023. FINDINGS: Brain: Acute infarcts in the posterior limb of the right internal capsule and the overlying right frontal white matter. Additional acute infarct in the pons. Six remote infarct in the right frontal white matter, bilateral basal ganglia, and left periatrial white matter. Small remote infarct in the pons. Scattered T2/FLAIR hyperintensity white matter compatible with chronic microvascular ischemic disease. No evidence of acute hemorrhage, mass lesion, midline shift or hydrocephalus. Vascular: Major arterial flow voids are maintained skull base. Skull and upper cervical spine: Normal marrow signal. Sinuses/Orbits: Negative. IMPRESSION: 1. Acute infarcts in the posterior limb of the right internal capsule, the overlying right frontal white matter, and pons. Given involvement of multiple vascular territories, consider an embolic etiology. 2. Multiple remote infarcts and chronic microvascular ischemic disease, detailed above. Electronically Signed   By: Stevenson Elbe M.D.   On: 01/09/2024 21:07               LOS: 0 days   Jazmarie Biever  [facility]   Pager on [email or web address]. If 7PM-7AM, please contact night-coverage at [email or web address]     01/11/2024, 1:59 PM

## 2024-01-11 NOTE — Progress Notes (Signed)
 NEUROLOGY CONSULT FOLLOW UP NOTE   Date of service: Jan 11, 2024 Patient Name: Mark Mcintosh MRN:  [id number] DOB:  03-04-1971  Interval Hx/subjective   S: Patient reports some improvement in L sided weakness. He has significant L facial droop but denies dysphagia. No new neurologic complaints.  Vitals   Vitals:   01/10/24 2052 01/10/24 2355 01/11/24 0405 01/11/24 0854  BP: (!) 137/110 (!) 134/104 (!) 162/92 (!) 151/105  Pulse: 75 69 73 86  Resp: 20 18 20 19   Temp: 98.4 F (36.9 C) 97.7 F (36.5 C) 98.9 F (37.2 C) 98.7 F (37.1 C)  TempSrc:    Oral  SpO2: 100%  100% 97%  Weight:      Height:         Body mass index is 36.26 kg/m.  Physical Exam   Gen: patient lying in bed, NAD CV: extremities appear well-perfused Resp: normal WOB  Neurologic Examination   MS: alert, oriented x4, follows commands Speech: mild-to-moderate dysarthria, no aphasia CN: PERRL, VFF, EOMI, sensation intact, severe L lower facial droop, hearing intact to voice Motor: Drift but not to bed LUE and LLE, no drift RUE and RLE Sensory: SILT Reflexes: 2+ symm with toes down bilat Coordination: intact on R Gait: deferred  NIHSS components Score: Comment  1a Level of Conscious 0[]  1[]  2[]  3[]      1b LOC Questions 0[]  1[]  2[]       1c LOC Commands 0[]  1[]  2[]       2 Best Gaze 0[]  1[]  2[]       3 Visual 0[]  1[]  2[]  3[]      4 Facial Palsy 0[]  1[]  2[x]  3[]      5a Motor Arm - left 0[]  1[x]  2[]  3[]  4[]  UN[]    5b Motor Arm - Right 0[]  1[]  2[]  3[]  4[]  UN[]    6a Motor Leg - Left 0[]  1[x]  2[]  3[]  4[]  UN[]    6b Motor Leg - Right 0[]  1[]  2[]  3[]  4[]  UN[]    7 Limb Ataxia 0[]  1[]  2[]  3[]  UN[]     8 Sensory 0[]  1[]  2[]  UN[]      9 Best Language 0[]  1[]  2[]  3[]      10 Dysarthria 0[]  1[x]  2[]  UN[]      11 Extinct. and Inattention 0[]  1[]  2[]       TOTAL:  5 (vs 8 on admission)   Any unchecked boxes represent a score for that item of 0    Medications  Current Facility-Administered Medications:     acetaminophen  (TYLENOL ) tablet 650 mg, 650 mg, Oral, Q4H PRN **OR** acetaminophen  (TYLENOL ) 160 MG/5ML solution 650 mg, 650 mg, Per Tube, Q4H PRN **OR** acetaminophen  (TYLENOL ) suppository 650 mg, 650 mg, Rectal, Q4H PRN, Lanetta Pion, MD   amLODipine  (NORVASC ) tablet 5 mg, 5 mg, Oral, Daily, Ayiku, Bernard, MD, 5 mg at 01/11/24 0856   aspirin  EC tablet 81 mg, 81 mg, Oral, Daily, Brion Cancel V, MD, 81 mg at 01/11/24 0857   [START ON 01/12/2024] atorvastatin  (LIPITOR) tablet 40 mg, 40 mg, Oral, Daily, Eleni Griffin, MD   clopidogrel  (PLAVIX ) tablet 75 mg, 75 mg, Oral, Daily, Brion Cancel V, MD, 75 mg at 01/11/24 7829   enoxaparin  (LOVENOX ) injection 60 mg, 0.5 mg/kg, Subcutaneous, Q24H, Brion Cancel V, MD, 60 mg at 01/10/24 2115   hydrALAZINE  (APRESOLINE ) tablet 25 mg, 25 mg, Oral, Q6H PRN, Ayiku, Bernard, MD   nicotine  (NICODERM CQ  - dosed in mg/24 hours) patch 21 mg, 21 mg, Transdermal, Daily, Lanetta Pion, MD, 21  mg at 01/11/24 0859  Labs and Diagnostic Imaging   CBC:  Recent Labs  Lab 01/09/24 1741  WBC 5.6  NEUTROABS 3.5  HGB 14.3  HCT 44.5  MCV 94.1  PLT 277    Basic Metabolic Panel:  Lab Results  Component Value Date   NA 139 01/09/2024   K 3.1 (L) 01/09/2024   CO2 28 01/09/2024   GLUCOSE 106 (H) 01/09/2024   BUN 8 01/09/2024   CREATININE 1.00 01/09/2024   CALCIUM 9.0 01/09/2024   GFRNONAA >60 01/09/2024   GFRAA >60 11/05/2019   Lipid Panel:  Lab Results  Component Value Date   LDLCALC 120 (H) 01/10/2024   HgbA1c:  Lab Results  Component Value Date   HGBA1C 5.7 (H) 01/10/2024   Urine Drug Screen: No results found for: "LABOPIA", "COCAINSCRNUR", "LABBENZ", "AMPHETMU", "THCU", "LABBARB"  Alcohol Level     Component Value Date/Time   ETH <15 01/09/2024 1741   INR  Lab Results  Component Value Date   INR 1.1 01/09/2024   APTT  Lab Results  Component Value Date   APTT 30 01/09/2024   AED levels: No results found for: "PHENYTOIN",  "ZONISAMIDE", "LAMOTRIGINE", "LEVETIRACETA"  CT head CT angio Head and Neck with contrast(Personally reviewed): 1. No acute intracranial hemorrhage or mass effect. 2. No large vessel occlusion, hemodynamically significant stenosis, or aneurysm in the head or neck. 3. Remote lacunar infarcts in the genu of the left internal capsule, globus pallidus, and right corona radiata. 4. Acute infarct involving the posterior limb of the right internal capsule is less well appreciated at CT. 5. Moderately advanced periventricular white matter disease for age.   MRI Brain(Personally reviewed): 1. Acute infarcts in the posterior limb of the right internal capsule, the overlying right frontal white matter, and pons. Given involvement of multiple vascular territories, consider an embolic etiology. 2. Multiple remote infarcts and chronic microvascular ischemic disease, detailed above.   TTE 1. Left ventricular ejection fraction, by estimation, is 55 to 60% . The left ventricle has normal function. The left ventricle has no regional wall motion abnormalities. Left ventricular diastolic parameters are indeterminate. 2. Right ventricular systolic function is normal. The right ventricular size is normal. There is mildly elevated pulmonary artery systolic pressure. 3. The mitral valve is normal in structure. Trivial mitral valve regurgitation. No evidence of mitral stenosis. 4. The aortic valve is normal in structure. Aortic valve regurgitation is trivial. No aortic stenosis is present. 5. The inferior vena cava is normal in size with greater than 50% respiratory variability, suggesting right atrial pressure of 3 mmHg. 6. Agitated saline contrast bubble study was positive with shunting observed after > 6 cardiac cycles suggestive of intrapulmonary shunting.  BLE US  Negative for DVT  Assessment   Mark Mcintosh is a 53 y.o. male with hx of hypertension and tobacco use disorder who presented to ED yesterday  for acute onset L sided weakness. Patient was not a candidate for TNK 2/2 presentation outside the window, and was not a candidate for intervention due to no LVO.  Patient has multiple acute infarcts in both anterior and posterior circulations suggesting central embolic etiology.  TTE did not show intracardiac clot.  Recommend consultation to cardiology for TEE for further evaluation.  Bubble study was positive but BLE US  showed no e/o DVT.  Recommendations   - Consult to cardiology for TEE; discussed with patient, he is amenable - No indication for permissive HTN >48 hrs after sx onset - ASA 81mg  daily +  plavix 75mg  daily x21 days f/b ASA 81mg  daily monotherapy after that. If DVT or intracardiac clot is identified and patient is started on therapeutic anticoagulation, there is no neurologic indication to continue the antiplatelets. - Atorvastatin 40mg  daily for goal LDL <70 - q4 hr neuro checks - STAT head CT for any change in neuro exam - Tele - PT/OT/SLP - Stroke education - Amb referral to neurology upon discharge - Will continue to follow ______________________________________________________________________   Signed, Eleni Griffin, MD [facility]

## 2024-01-12 DIAGNOSIS — I639 Cerebral infarction, unspecified: Secondary | ICD-10-CM | POA: Diagnosis not present

## 2024-01-12 MED ORDER — PREDNISOLONE ACETATE 1 % OP SUSP
1.0000 [drp] | Freq: Four times a day (QID) | OPHTHALMIC | Status: DC
  Administered 2024-01-12 – 2024-01-13 (×7): 1 [drp] via OPHTHALMIC
  Filled 2024-01-12: qty 1

## 2024-01-12 NOTE — Progress Notes (Addendum)
 Physical Therapy Treatment Patient Details Name: Mark Mcintosh MRN: [id number] DOB: Nov 03, 1970 Today's Date: 01/12/2024   History of Present Illness presented to ER secondary to acute onset of L-sided weakness; admitted for TIA/CVA work-up.  MRI significant for acute infarcts to posterior limb of internal capsule, R frontal white matter and pons.    PT Comments  Pt was supine in bed upon arrival. He is A and O x 4. Highly motivated and cooperative. PT remains far from his baseline abilities. He required more assistance to prevent falling today during all OOB activity. L side neglect/ deficits apparent throughout session. Attempted ambulation without AD however due to balance deficits, unsafe to continue due to having 3 episodes of LOB with intervention to prevent falling. Pt ambulated with RW 200 ft but still had one episode of LOB with intervention to prevent fall. Pt unable to maintain grasp on RW. Would benefit from walker splint to assist with maintaining grip on AD. Author feels pt would benefit form aggressive post acute PT to maximize his safety with all ADLs. Pt is eager to return to work however Chartered loss adjuster discussed concerns and importance of first few months after CVA. D/C recs updated to reflect pt's needs and concerns with balance/ safe functional mobility.    If plan is discharge home, recommend the following: A little help with walking and/or transfers;A lot of help with bathing/dressing/bathroom;Assistance with cooking/housework;Assist for transportation;Help with stairs or ramp for entrance     Equipment Recommendations  Rolling walker (2 wheels);BSC/3in1;Other (comment) (if DCing home, walker splint for LUE)       Precautions / Restrictions Precautions Precautions: Fall Recall of Precautions/Restrictions: Impaired Restrictions Weight Bearing Restrictions Per Provider Order: No     Mobility  Bed Mobility Overal bed mobility: Needs Assistance Bed Mobility: Supine to Sit   Supine to sit: Supervision  General bed mobility comments: no physical assistance however ot neglecting LUE. Poor overall coordination of movements    Transfers Overall transfer level: Needs assistance Equipment used: Rolling walker (2 wheels), None Transfers: Sit to/from Stand Sit to Stand: Min assist, Contact guard assist  General transfer comment: Pt was able to stand with and without AD. Pt very unsteady wiuthout UE support    Ambulation/Gait Ambulation/Gait assistance: Mod assist, Min assist Gait Distance (Feet): 200 Feet Assistive device: None, 1 person hand held assist, Rolling walker (2 wheels) Gait Pattern/deviations: Staggering left Gait velocity: decreased  General Gait Details: Pt ambulated 200 ft with RW however attempted ambulation with only +1 UE support and have 3 occasions of LOB with intervention to prevent falling. PT needs constant vcs to not neglect LUE on RW. poor ability to maintain grip on RW. pt remains high fall risk and was encouraged to use RW at all times until balamnce improves    Balance Overall balance assessment: Needs assistance Sitting-balance support: No upper extremity supported, Feet supported Sitting balance-Leahy Scale: Fair Sitting balance - Comments: one occasion of LOB towards L while seated EOB   Standing balance support: No upper extremity supported, During functional activity, Reliant on assistive device for balance Standing balance-Leahy Scale: Poor Standing balance comment: pt is at high risk of falls without BUE support       Communication Communication Communication: Impaired Factors Affecting Communication: Reduced clarity of speech  Cognition Arousal: Alert Behavior During Therapy: WFL for tasks assessed/performed   PT - Cognitive impairments: No apparent impairments      PT - Cognition Comments: Pt is A and O x 4. Ask appropriate questions  throughout session Following commands: Intact      Cueing Cueing Techniques:  Verbal cues, Tactile cues     General Comments General comments (skin integrity, edema, etc.): Discussed post acute PT/ DC recommendations      Pertinent Vitals/Pain Pain Assessment Pain Assessment: 0-10 Pain Score: 6  Pain Location: LBP Pain Descriptors / Indicators: Discomfort     PT Goals (current goals can now be found in the care plan section) Acute Rehab PT Goals Patient Stated Goal: get better so I can return to work Progress towards PT goals: Progressing toward goals    Frequency    Min 3X/week       AM-PAC PT "6 Clicks" Mobility   Outcome Measure  Help needed turning from your back to your side while in a flat bed without using bedrails?: None Help needed moving from lying on your back to sitting on the side of a flat bed without using bedrails?: A Little Help needed moving to and from a bed to a chair (including a wheelchair)?: A Little Help needed standing up from a chair using your arms (e.g., wheelchair or bedside chair)?: A Little Help needed to walk in hospital room?: A Little Help needed climbing 3-5 steps with a railing? : A Lot 6 Click Score: 18    End of Session   Activity Tolerance: Patient tolerated treatment well Patient left: in chair;with call bell/phone within reach Nurse Communication: Mobility status PT Visit Diagnosis: Muscle weakness (generalized) (M62.81);Difficulty in walking, not elsewhere classified (R26.2)     Time: 0981-1914 PT Time Calculation (min) (ACUTE ONLY): 10 min  Charges:    $Neuromuscular Re-education: 8-22 mins PT General Charges $$ ACUTE PT VISIT: 1 Visit                     Chester Costa PTA 01/12/24, 10:10 AM

## 2024-01-12 NOTE — Progress Notes (Signed)
 Patient ID: Mark Mcintosh, male   DOB: 18-Aug-1971, 53 y.o.   MRN: [id number] [facility]    SUBJECTIVE: Patient resting comfortably in bed no chest pain no new neurological findings feels reasonably well regaining almost normal function   Vitals:   01/11/24 2054 01/12/24 0008 01/12/24 0527 01/12/24 0957  BP: (!) 153/107 (!) 144/124 (!) 148/82 (!) 113/93  Pulse: 87 85 85 80  Resp: 18 18 20 20   Temp: 98.2 F (36.8 C) 98.5 F (36.9 C) 97.9 F (36.6 C) 98.6 F (37 C)  TempSrc:      SpO2: 98% 98% 95% 94%  Weight:      Height:        No intake or output data in the 24 hours ending 01/12/24 1343    PHYSICAL EXAM  General: Well developed, well nourished, in no acute distress HEENT:  Normocephalic and atramatic Neck:  No JVD.  Lungs: Clear bilaterally to auscultation and percussion. Heart: HRRR . Normal S1 and S2 without gallops or murmurs.  Abdomen: Bowel sounds are positive, abdomen soft and non-tender  Msk:  Back normal, normal gait. Normal strength and tone for age. Extremities: No clubbing, cyanosis or edema.   Neuro: Alert and oriented X 3. Psych:  Good affect, responds appropriately   LABS: Basic Metabolic Panel: Recent Labs    01/09/24 1741 01/11/24 1436  NA 139  --   K 3.1* 3.5  CL 98  --   CO2 28  --   GLUCOSE 106*  --   BUN 8  --   CREATININE 1.00  --   CALCIUM  9.0  --    Liver Function Tests: Recent Labs    01/09/24 1741  AST 20  ALT 13  ALKPHOS 61  BILITOT 0.9  PROT 8.1  ALBUMIN 3.9   No results for input(s): LIPASE, AMYLASE in the last 72 hours. CBC: Recent Labs    01/09/24 1741  WBC 5.6  NEUTROABS 3.5  HGB 14.3  HCT 44.5  MCV 94.1  PLT 277   Cardiac Enzymes: No results for input(s): CKTOTAL, CKMB, CKMBINDEX, TROPONINI in the last 72 hours. BNP: Invalid input(s): POCBNP D-Dimer: No results for input(s): DDIMER in the last 72 hours. Hemoglobin A1C: Recent Labs    01/10/24 0533  HGBA1C 5.7*   Fasting  Lipid Panel: Recent Labs    01/10/24 0533  CHOL 185  HDL 42  LDLCALC 120*  TRIG 117  CHOLHDL 4.4   Thyroid Function Tests: No results for input(s): TSH, T4TOTAL, T3FREE, THYROIDAB in the last 72 hours.  Invalid input(s): FREET3 Anemia Panel: No results for input(s): VITAMINB12, FOLATE, FERRITIN, TIBC, IRON, RETICCTPCT in the last 72 hours.  US  Venous Img Lower Bilateral (DVT) Result Date: 01/11/2024 CLINICAL DATA:  Stroke EXAM: BILATERAL LOWER EXTREMITY VENOUS DOPPLER ULTRASOUND TECHNIQUE: Gray-scale sonography with compression, as well as color and duplex ultrasound, were performed to evaluate the deep venous system(s) from the level of the common femoral vein through the popliteal and proximal calf veins. COMPARISON:  None Available. FINDINGS: VENOUS Normal compressibility of the common femoral, superficial femoral, and popliteal veins, as well as the visualized calf veins. Visualized portions of profunda femoral vein and great saphenous vein unremarkable. No filling defects to suggest DVT on grayscale or color Doppler imaging. Doppler waveforms show normal direction of venous flow, normal respiratory plasticity and response to augmentation. OTHER None. Limitations: none IMPRESSION: Negative. Electronically Signed   By: Reagan Camera M.D.   On: 01/11/2024 11:09  Echo normal surface echo negative bubble study normal EF  TELEMETRY: Normal sinus rhythm nonspecific ST-T wave changes rate of 85:  ASSESSMENT AND PLAN:  Principal Problem:   Acute CVA (cerebrovascular accident) (HCC) Active Problems:   Tobacco use disorder   Hypertension   Obesity (BMI 30-39.9) COPD  Plan Schedule for TEE for possible PFO patency contributing to his stroke Continue anticoagulation therapy for atrial fibrillation Risk factor modification refrain from tobacco abuse controlled blood pressure statin therapy Aggressive hypertension control post CVA Inhalers as necessary for COPD  advised patient to refrain from tobacco abuse Recommend weight loss exercise portion control Preop for TEE with Dr. Bob Burn tomorrow   Antonette Batters, MD, 01/12/2024 1:43 PM

## 2024-01-12 NOTE — Progress Notes (Signed)
 SLP Cancellation Note  Patient Details Name: Mark Mcintosh MRN: [id number] DOB: [birth year]   Cancelled treatment:       Reason Eval/Treat Not Completed: Fatigue/lethargy limiting ability to participate. Pt fatigued/reporting lack of hunger; therefore, no dysphagia intervention completed. RN/pt denied issue with diet. Continue to recommend follow up at next venue of care for dysarthria.   [place] Se[place] Axel Clapp, MS, CCC-SLP Speech Language Pathologist Rehab Services; [facility] [phone] (ascom)     [place] J Clapp 01/12/2024, 11:04 AM

## 2024-01-12 NOTE — TOC Progression Note (Addendum)
 Transition of Care [facility]) - Progression Note    Patient Details  Name: Mark Mcintosh MRN: [id number] Date of Birth: 1970/10/28  Transition of Care [facility] CM/SW Contact  Odilia Bennett, LCSW Phone Number: 01/12/2024, 1:07 PM  Clinical Narrative:   CSW went by room to discuss therapy recommendations. Patient was asleep and did not awake to CSW calling his name. Will try again later.  2:49 pm: CSW met with patient. Two male family members at bedside. CSW introduced role and explained that therapy recommendations would be discussed. Patient and family are agreeable to inpatient rehab as well as SNF referral in case he is unable to admit there. No further concerns. CSW will continue to follow patient and his family for support and facilitate discharge once medically stable.  Expected Discharge Plan and Services                                               Social Determinants of Health (SDOH) Interventions SDOH Screenings   Food Insecurity: No Food Insecurity (01/10/2024)  Housing: Low Risk  (01/10/2024)  Transportation Needs: No Transportation Needs (01/10/2024)  Utilities: Not At Risk (01/10/2024)  Tobacco Use: High Risk (01/09/2024)    Readmission Risk Interventions     No data to display

## 2024-01-12 NOTE — Progress Notes (Signed)
Inpatient Rehab Admissions Coordinator:   Per therapy recommendations,  patient was screened for CIR candidacy by Megan Salon, MS, CCC-SLP. At this time, Pt. Appears to be a a potential candidate for CIR. I will place   order for rehab consult per protocol for full assessment. Please contact me any with questions.  Megan Salon, MS, CCC-SLP Rehab Admissions Coordinator  [phone] (office)   Megan Salon, MS, CCC-SLP Rehab Admissions Coordinator  [phone] (office)

## 2024-01-12 NOTE — NC FL2 (Signed)
 [place]  MEDICAID [id number] LEVEL OF CARE FORM     IDENTIFICATION  Patient Name: Mark Mcintosh Birthdate: [birth year] Sex: male Admission Date (Current Location): 01/09/2024  [facility] and [place] Number:  Chiropodist and Address:  [facility], [address] [place], [place] [address]      Provider Number: [id number]  Attending Physician Name and Address:  Sheril Dines, MD  Relative Name and Phone Number:       Current Level of Care: Hospital Recommended Level of Care: Skilled Nursing Facility Prior Approval Number:    Date Approved/Denied:   PASRR Number: [id number]  Discharge Plan: SNF    Current Diagnoses: Patient Active Problem List   Diagnosis Date Noted   Acute CVA (cerebrovascular accident) (HCC) 01/09/2024   Tobacco use disorder 01/09/2024   Obesity (BMI 30-39.9) 01/09/2024   Hypertension     Orientation RESPIRATION BLADDER Height & Weight     Self, Time, Situation, Place  Normal Continent Weight: 260 lb (117.9 kg) Height:  5\' 11"  (180.3 cm)  BEHAVIORAL SYMPTOMS/MOOD NEUROLOGICAL BOWEL NUTRITION STATUS   (None)  (Acute CVA) Continent Diet (Heart healthy)  AMBULATORY STATUS COMMUNICATION OF NEEDS Skin   Limited Assist Verbally Normal                       Personal Care Assistance Level of Assistance  Bathing, Feeding, Dressing Bathing Assistance: Limited assistance Feeding assistance: Limited assistance Dressing Assistance: Limited assistance     Functional Limitations Info  Sight, Hearing, Speech Sight Info: Adequate Hearing Info: Adequate Speech Info: Adequate    SPECIAL CARE FACTORS FREQUENCY  PT (By licensed PT), OT (By licensed OT), Speech therapy     PT Frequency: 5 x week OT Frequency: 5 x week     Speech Therapy Frequency: 5 x week      Contractures Contractures Info: Not present    Additional Factors Info  Code Status, Allergies Code Status Info: Full code Allergies Info: NKDA            Current Medications (01/12/2024):  This is the current hospital active medication list Current Facility-Administered Medications  Medication Dose Route Frequency Provider Last Rate Last Admin   acetaminophen  (TYLENOL ) tablet 650 mg  650 mg Oral Q4H PRN Duncan, Hazel V, MD       Or   acetaminophen  (TYLENOL ) 160 MG/5ML solution 650 mg  650 mg Per Tube Q4H PRN Duncan, Hazel V, MD       Or   acetaminophen  (TYLENOL ) suppository 650 mg  650 mg Rectal Q4H PRN Lanetta Pion, MD       amLODipine  (NORVASC ) tablet 5 mg  5 mg Oral Daily Ayiku, Bernard, MD   5 mg at 01/12/24 1111   aspirin EC tablet 81 mg  81 mg Oral Daily Lanetta Pion, MD   81 mg at 01/12/24 1111   atorvastatin (LIPITOR) tablet 40 mg  40 mg Oral Daily Stack, Colleen M, MD   40 mg at 01/12/24 1433   clopidogrel (PLAVIX) tablet 75 mg  75 mg Oral Daily Duncan, Hazel V, MD   75 mg at 01/12/24 1111   enoxaparin (LOVENOX) injection 60 mg  0.5 mg/kg Subcutaneous Q24H Duncan, Hazel V, MD   60 mg at 01/11/24 2241   hydrALAZINE (APRESOLINE) tablet 25 mg  25 mg Oral Q6H PRN Sheril Dines, MD       nicotine (NICODERM CQ - dosed in mg/24 hours) patch 21 mg  21  mg Transdermal Daily Duncan, Hazel V, MD   21 mg at 01/12/24 1114   prednisoLONE  acetate (PRED FORTE ) 1 % ophthalmic suspension 1 drop  1 drop Both Eyes QID Sheril Dines, MD   1 drop at 01/12/24 1434     Discharge Medications: Please see discharge summary for a list of discharge medications.  Relevant Imaging Results:  Relevant Lab Results:   Additional Information SS#: [id number]  Odilia Bennett, LCSW

## 2024-01-12 NOTE — Plan of Care (Signed)
 TEE pending. Will follow  -- A. Bonnita Buttner, MD Neurology

## 2024-01-12 NOTE — Progress Notes (Signed)
 Progress Note    Mark Mcintosh  ZOX:[id number] DOB: 1971/05/28  DOA: 01/09/2024 PCP: Pcp, No      Brief Narrative:    Medical records reviewed and are as summarized below:  Mark Mcintosh is a 53 y.o. male with medical history significant for hypertension, tobacco use disorder, who presented to the hospital because of left-sided weakness.   He was found to have acute stroke.   Assessment/Plan:   Principal Problem:   Acute CVA (cerebrovascular accident) (HCC) Active Problems:   Tobacco use disorder   Hypertension   Obesity (BMI 30-39.9)    Body mass index is 36.26 kg/m.  Class II obesity   Acute stroke: Infarct seen in the posterior limb of the right internal capsule, overlying right frontal white matter and pons.  Embolic etiology is suspected given multiple vascular territories.  There is also evidence of multiple remote infarcts and chronic microvascular ischemic disease. 2D echo showed EF estimated at 55 to 60%, indeterminate LV diastolic parameters, mildly elevated pulmonary artery systolic pressure, positive bubble study suggestive of extracardiac shunt/intrapulmonary shunt CTA head and neck did not show any large vessel occlusion. Venous duplex of bilateral lower extremity did not show any DVT. Lipid panel total cholesterol 185, triglyceride 117, HDL 42, LDL 120. Neurologist recommended dual antiplatelet therapy with aspirin and Plavix for 21 days total followed by aspirin monotherapy. Plan for TEE tomorrow.  Follow-up with cardiologist.   Hypertension: Continue amlodipine    Prediabetes: Hemoglobin A1c 5.7.  Discussed diagnosis and management.  Encouraged regular exercise, weight loss and healthy eating habits   Hypokalemia: Improved   Tobacco use disorder: Counseled to quit smoking cigarettes   Bilateral conjunctival inflammation: Restart prednisone  eyedrops.  Patient was seen by Dr. Ignatius Makos, ophthalmologist, in the ED for red eyes.  He  was prescribed prednisone  eyedrops with plan to follow-up with with ophthalmologist in the office.   Plan of care was discussed with Casandra Claw (his nurse, friend) and Stana Ear (Belinda's mother) at the bedside.  Diet Order             Diet Heart Room service appropriate? Yes; Fluid consistency: Thin  Diet effective now                            Consultants: Neurologist Cardiologist  Procedures: None    Medications:    amLODipine   5 mg Oral Daily   aspirin EC  81 mg Oral Daily   atorvastatin  40 mg Oral Daily   clopidogrel  75 mg Oral Daily   enoxaparin (LOVENOX) injection  0.5 mg/kg Subcutaneous Q24H   nicotine  21 mg Transdermal Daily   prednisoLONE  acetate  1 drop Both Eyes QID   Continuous Infusions:   Anti-infectives (From admission, onward)    None              Family Communication/Anticipated D/C date and plan/Code Status   DVT prophylaxis:      Code Status: Full Code  Family Communication: Plan of care was discussed with Belinda (his nurse, friend) and Stana Ear (Belinda's mother) at the bedside. Disposition Plan: Plan to discharge home   Status is: Observation The patient will require care spanning > 2 midnights and should be moved to inpatient because: Acute stroke, workup in progress       Subjective:   Interval events noted.  He complains of right eyes.  There is also some mild itching in the eyes.  No discharge  from the eyes.  Left-sided weakness is better.  Objective:    Vitals:   01/11/24 2054 01/12/24 0008 01/12/24 0527 01/12/24 0957  BP: (!) 153/107 (!) 144/124 (!) 148/82 (!) 113/93  Pulse: 87 85 85 80  Resp: 18 18 20 20   Temp: 98.2 F (36.8 C) 98.5 F (36.9 C) 97.9 F (36.6 C) 98.6 F (37 C)  TempSrc:      SpO2: 98% 98% 95% 94%  Weight:      Height:       No data found.  No intake or output data in the 24 hours ending 01/12/24 1106 Filed Weights   01/09/24 1730  Weight: 117.9 kg    Exam:   GEN:  NAD SKIN: Warm and dry EYES: EOMI, red eyes but no discharge ENT: MMM CV: RRR PULM: CTA B ABD: soft, ND, NT, +BS CNS: AAO x 3, left facial droop otherwise no gross focal deficits noted EXT: No edema or tenderness       Data Reviewed:   I have personally reviewed following labs and imaging studies:  Labs: Labs show the following:   Basic Metabolic Panel: Recent Labs  Lab 01/09/24 1741 01/11/24 1436  NA 139  --   K 3.1* 3.5  CL 98  --   CO2 28  --   GLUCOSE 106*  --   BUN 8  --   CREATININE 1.00  --   CALCIUM 9.0  --    GFR Estimated Creatinine Clearance: 112.8 mL/min (by C-G formula based on SCr of 1 mg/dL). Liver Function Tests: Recent Labs  Lab 01/09/24 1741  AST 20  ALT 13  ALKPHOS 61  BILITOT 0.9  PROT 8.1  ALBUMIN 3.9   No results for input(s): "LIPASE", "AMYLASE" in the last 168 hours. No results for input(s): "AMMONIA" in the last 168 hours. Coagulation profile Recent Labs  Lab 01/09/24 1741  INR 1.1    CBC: Recent Labs  Lab 01/09/24 1741  WBC 5.6  NEUTROABS 3.5  HGB 14.3  HCT 44.5  MCV 94.1  PLT 277   Cardiac Enzymes: No results for input(s): "CKTOTAL", "CKMB", "CKMBINDEX", "TROPONINI" in the last 168 hours. BNP (last 3 results) No results for input(s): "PROBNP" in the last 8760 hours. CBG: Recent Labs  Lab 01/09/24 1749  GLUCAP 112*   D-Dimer: No results for input(s): "DDIMER" in the last 72 hours. Hgb A1c: Recent Labs    01/10/24 0533  HGBA1C 5.7*   Lipid Profile: Recent Labs    01/10/24 0533  CHOL 185  HDL 42  LDLCALC 120*  TRIG 117  CHOLHDL 4.4   Thyroid function studies: No results for input(s): "TSH", "T4TOTAL", "T3FREE", "THYROIDAB" in the last 72 hours.  Invalid input(s): "FREET3" Anemia work up: No results for input(s): "VITAMINB12", "FOLATE", "FERRITIN", "TIBC", "IRON", "RETICCTPCT" in the last 72 hours. Sepsis Labs: Recent Labs  Lab 01/09/24 1741  WBC 5.6    Microbiology No results found  for this or any previous visit (from the past 240 hours).  Procedures and diagnostic studies:  US  Venous Img Lower Bilateral (DVT) Result Date: 01/11/2024 CLINICAL DATA:  Stroke EXAM: BILATERAL LOWER EXTREMITY VENOUS DOPPLER ULTRASOUND TECHNIQUE: Gray-scale sonography with compression, as well as color and duplex ultrasound, were performed to evaluate the deep venous system(s) from the level of the common femoral vein through the popliteal and proximal calf veins. COMPARISON:  None Available. FINDINGS: VENOUS Normal compressibility of the common femoral, superficial femoral, and popliteal veins, as well as the  visualized calf veins. Visualized portions of profunda femoral vein and great saphenous vein unremarkable. No filling defects to suggest DVT on grayscale or color Doppler imaging. Doppler waveforms show normal direction of venous flow, normal respiratory plasticity and response to augmentation. OTHER None. Limitations: none IMPRESSION: Negative. Electronically Signed   By: Reagan Camera M.D.   On: 01/11/2024 11:09   CT ANGIO HEAD NECK W WO CM Result Date: 01/10/2024 EXAM: CT HEAD WITHOUT CTA HEAD AND NECK WITH AND WITHOUT 01/10/2024 01:39:58 PM TECHNIQUE: CTA of the head and neck was performed with and without the administration of intravenous contrast. Noncontrast CT of the head with reconstructed 2-D images are also provided for review. Multiplanar 2D and/or 3D reformatted images are provided for review. Automated exposure control, iterative reconstruction, and/or weight based adjustment of the mA/kV was utilized to reduce the radiation dose to as low as reasonably achievable. COMPARISON: 01/09/2024 CLINICAL HISTORY: Stroke, follow up. FINDINGS: CT HEAD: BRAIN AND VENTRICLES: A remote lacunar infarct is again noted in the genu of the left internal capsule and globus pallidus. Remote lacunar infarcts are present in the right corona radiata. Periventricular white matter disease is moderately advanced  for age. The acute infarct involving the posterior limb of the right internal capsule is less well appreciated. No acute intracranial hemorrhage. No mass effect or midline shift. No extra-axial fluid collection. Gray-white differentiation is maintained. No hydrocephalus. ORBITS: No acute abnormality. SINUSES: No acute abnormality. SOFT TISSUES AND SKULL: No acute abnormality. CTA NECK: AORTIC ARCH AND ARCH VESSELS: No dissection or arterial injury. No significant stenosis of the brachiocephalic or subclavian arteries. CERVICAL CAROTID ARTERIES: No dissection, arterial injury, or hemodynamically significant stenosis by NASCET criteria. CERVICAL VERTEBRAL ARTERIES: The left vertebral artery is dominant. No dissection, arterial injury, or significant stenosis. VISUALIZED LUNGS AND MEDIASTINUM: Centrilobular emphysematous changes are present. SOFT TISSUES: No acute abnormality. BONES: Mild degenerative changes are present in the lower cervical spine. Extensive dental disease is present. No focal osseous lesions are present. CTA HEAD: ANTERIOR CIRCULATION: No significant stenosis of the internal carotid arteries. No significant stenosis of the anterior cerebral arteries. No significant stenosis of the middle cerebral arteries. No aneurysm. POSTERIOR CIRCULATION: No significant stenosis of the posterior cerebral arteries. No significant stenosis of the basilar artery. No significant stenosis of the vertebral arteries. No aneurysm. OTHER: No dural venous sinus thrombosis on this non-dedicated study. IMPRESSION: 1. No acute intracranial hemorrhage or mass effect. 2. No large vessel occlusion, hemodynamically significant stenosis, or aneurysm in the head or neck. 3. Remote lacunar infarcts in the genu of the left internal capsule, globus pallidus, and right corona radiata. 4. Acute infarct involving the posterior limb of the right internal capsule is less well appreciated at CT. 5. Moderately advanced periventricular white  matter disease for age. Electronically signed by: Audree Leas MD 01/10/2024 05:39 PM EDT RP Workstation: [id number]               LOS: 0 days   Marrion Finan  [facility]   Pager on [email or web address]. If 7PM-7AM, please contact night-coverage at [email or web address]     01/12/2024, 11:06 AM

## 2024-01-12 NOTE — Plan of Care (Signed)
 Patient remains free from any signs of acute changes in current physical status/condition.  NIH scores: 9.  Continues to have right sided weakness, with  slight right facial droop noted.  Patient remains free from any noted signs of acute GI distress.  Lung fields remain clear throughout RN assessment.  Patient remains standby assist.  Noted to be high falls risk secondary to generalized weakness, secondary to recent CVA.  (L) FA, and (R) hand PIV'S remain patent, flushes without difficulty.  Patient to continued to be monitored by hospital staff until discharged.

## 2024-01-12 NOTE — Progress Notes (Signed)
 Mobility Specialist - Progress Note    01/12/24 1300  Mobility  Activity Ambulated with assistance in hallway  Level of Assistance Contact guard assist, steadying assist  Assistive Device Front wheel walker;Cane  Distance Ambulated (ft) 340 ft  Range of Motion/Exercises Active  Activity Response Tolerated well  Mobility Referral Yes  Mobility visit 1 Mobility  Mobility Specialist Start Time (ACUTE ONLY) 1248  Mobility Specialist Stop Time (ACUTE ONLY) 1304  Mobility Specialist Time Calculation (min) (ACUTE ONLY) 16 min   Pt resting in recline on RA upon entry. Pt STS and ambulates to hallway with walker for first lap CGA. Pt has weakness on right side and displays difficulty keeping left hand on walker. Pt maintained upright stability throughout session with slight left lean and verbal cuing to walk in the middle to avoid hitting objects on his left side. Pt said he has trouble walking in a straight line. Pt tried hemi walker first and then cane on second lap. Pt endorses better stability with cane but pt given education to only walk with cane with nursing present or rehab staffing to avoid LOB. Pt returned to bed and left with needs in reach. Family present at bedside.   Jerri Morale Mobility Specialist 01/12/24, 1:59 PM

## 2024-01-13 ENCOUNTER — Encounter
Admission: EM | Disposition: A | Discharge status: Rehabilitation Facility | Payer: Self-pay | Source: Home / Self Care | Attending: Internal Medicine

## 2024-01-13 ENCOUNTER — Encounter: Payer: Self-pay | Admitting: Certified Registered"

## 2024-01-13 DIAGNOSIS — I639 Cerebral infarction, unspecified: Secondary | ICD-10-CM | POA: Diagnosis not present

## 2024-01-13 LAB — CBC
HCT: 44.3 % (ref 39.0–52.0)
Hemoglobin: 14.5 g/dL (ref 13.0–17.0)
MCH: 30.1 pg (ref 26.0–34.0)
MCHC: 32.7 g/dL (ref 30.0–36.0)
MCV: 91.9 fL (ref 80.0–100.0)
Platelets: 281 10*3/uL (ref 150–400)
RBC: 4.82 MIL/uL (ref 4.22–5.81)
RDW: 13.7 % (ref 11.5–15.5)
WBC: 5.7 10*3/uL (ref 4.0–10.5)
nRBC: 0 % (ref 0.0–0.2)

## 2024-01-13 LAB — BASIC METABOLIC PANEL WITH GFR
Anion gap: 10 (ref 5–15)
BUN: 16 mg/dL (ref 6–20)
CO2: 26 mmol/L (ref 22–32)
Calcium: 9 mg/dL (ref 8.9–10.3)
Chloride: 102 mmol/L (ref 98–111)
Creatinine, Ser: 0.99 mg/dL (ref 0.61–1.24)
GFR, Estimated: >60 mL/min (ref >60)
Glucose, Bld: 100 mg/dL — ABNORMAL HIGH (ref 70–99)
Potassium: 3.5 mmol/L (ref 3.5–5.1)
Sodium: 138 mmol/L (ref 135–145)

## 2024-01-13 SURGERY — ECHOCARDIOGRAM, TRANSESOPHAGEAL
Anesthesia: Moderate Sedation

## 2024-01-13 SURGERY — ECHOCARDIOGRAM, TRANSESOPHAGEAL
Anesthesia: General

## 2024-01-13 MED ORDER — AMLODIPINE BESYLATE 10 MG PO TABS
10.0000 mg | ORAL_TABLET | Freq: Every day | ORAL | Status: DC
  Administered 2024-01-14: 10 mg via ORAL
  Filled 2024-01-13: qty 1

## 2024-01-13 MED ORDER — SODIUM CHLORIDE 0.9% FLUSH: 3.0000 mL | INTRAVENOUS | Status: DC | PRN

## 2024-01-13 MED ORDER — SODIUM CHLORIDE 0.9% FLUSH
3.0000 mL | Freq: Two times a day (BID) | INTRAVENOUS | Status: DC
  Administered 2024-01-13 – 2024-01-14 (×3): 10 mL via INTRAVENOUS

## 2024-01-13 NOTE — TOC Progression Note (Signed)
 Transition of Care [name] - Progression Note    Patient Details  Name: Mark Mcintosh MRN: [id number] Date of Birth: 10/23/1970  Transition of Care [facility]) CM/SW Contact  Elsie Halo, RN Phone Number: 01/13/2024, 4:27 PM  Clinical Narrative:     Stella Edward outreached to Aisha Hove in finance via secure email and advised patient would like assistance with Medicaid and Short Term Disability applications.  TOC will continue to follow       Expected Discharge Plan and Services                                               Social Determinants of Health (SDOH) Interventions SDOH Screenings   Food Insecurity: No Food Insecurity (01/10/2024)  Housing: Low Risk  (01/10/2024)  Transportation Needs: No Transportation Needs (01/10/2024)  Utilities: Not At Risk (01/10/2024)  Tobacco Use: High Risk (01/09/2024)    Readmission Risk Interventions     No data to display

## 2024-01-13 NOTE — Progress Notes (Signed)
 Physical Therapy Treatment Patient Details Name: Mark Mcintosh MRN: [id number] DOB: [birth year] Today's Date: 01/13/2024   History of Present Illness presented to ER secondary to acute onset of L-sided weakness; admitted for TIA/CVA work-up.  MRI significant for acute infarcts to posterior limb of internal capsule, R frontal white matter and pons.    PT Comments  Patient is agreeable to PT session. Supportive visitor at the bedside. The patient continues to have left side weakness and inattention with cues required for increased awareness with functional mobility. Gait training continued in hallway with cues for improved gait kinematics and safety. Worsening gait pattern with fatigue. Patient is making progress and is motivated to participate. Recommend intensive rehabilitation > 3 hours/day after this hospital stay.    If plan is discharge home, recommend the following: A little help with walking and/or transfers;A lot of help with bathing/dressing/bathroom;Assistance with cooking/housework;Assist for transportation;Help with stairs or ramp for entrance   Can travel by private vehicle        Equipment Recommendations  Rolling walker (2 wheels);BSC/3in1    Recommendations for Other Services       Precautions / Restrictions Precautions Precautions: Fall Recall of Precautions/Restrictions: Impaired Restrictions Weight Bearing Restrictions Per Provider Order: No     Mobility  Bed Mobility               General bed mobility comments: not assessed as patient sitting up on arrival and post session    Transfers Overall transfer level: Needs assistance Equipment used: Rolling walker (2 wheels) Transfers: Sit to/from Stand Sit to Stand: Contact guard assist                Ambulation/Gait Ambulation/Gait assistance: Mod assist, Min assist Gait Distance (Feet):  (29ft, 85ft, 28ft) Assistive device: Rolling walker (2 wheels) Gait Pattern/deviations: Decreased  dorsiflexion - left, Decreased stride length Gait velocity: decreased     General Gait Details: decreased L foot clearance that improves with minimal verbal cues, however more pronounced with fatigue. short standing rest breaks required. intermittent assistance required to reposition left hand on the rolling walker. patient attempted to walk a short distance in the bathroom with no assistive device with worsening gait pattern and balance. highly recommend to continue rolling walker at this time   Stairs             Wheelchair Mobility     Tilt Bed    Modified Rankin (Stroke Patients Only)       Balance Overall balance assessment: Needs assistance Sitting-balance support: No upper extremity supported, Feet supported Sitting balance-Leahy Scale: Fair     Standing balance support: No upper extremity supported, During functional activity, Reliant on assistive device for balance Standing balance-Leahy Scale: Poor Standing balance comment: external support required to maintain standing balance                            Communication Communication Communication: Impaired Factors Affecting Communication: Reduced clarity of speech  Cognition Arousal: Alert Behavior During Therapy: WFL for tasks assessed/performed   PT - Cognitive impairments: No apparent impairments                         Following commands: Intact      Cueing Cueing Techniques: Verbal cues, Tactile cues, Visual cues  Exercises      General Comments        Pertinent Vitals/Pain Pain Assessment Pain Assessment: No/denies pain  Home Living                          Prior Function            PT Goals (current goals can now be found in the care plan section) Acute Rehab PT Goals Patient Stated Goal: return to work PT Goal Formulation: With patient Time For Goal Achievement: 01/24/24 Potential to Achieve Goals: Good Progress towards PT goals: Progressing  toward goals    Frequency    Min 3X/week      PT Plan      Co-evaluation              AM-PAC PT "6 Clicks" Mobility   Outcome Measure  Help needed turning from your back to your side while in a flat bed without using bedrails?: None Help needed moving from lying on your back to sitting on the side of a flat bed without using bedrails?: A Little Help needed moving to and from a bed to a chair (including a wheelchair)?: A Little Help needed standing up from a chair using your arms (e.g., wheelchair or bedside chair)?: A Little Help needed to walk in hospital room?: A Little Help needed climbing 3-5 steps with a railing? : A Lot 6 Click Score: 18    End of Session Equipment Utilized During Treatment: Gait belt Activity Tolerance: Patient tolerated treatment well Patient left: in bed;with call bell/phone within reach;with family/visitor present (seated on edge of bed with visitor present) Nurse Communication: Mobility status PT Visit Diagnosis: Muscle weakness (generalized) (M62.81);Difficulty in walking, not elsewhere classified (R26.2)     Time: 1610-9604 PT Time Calculation (min) (ACUTE ONLY): 14 min  Charges:    $Gait Training: 8-22 mins PT General Charges $$ ACUTE PT VISIT: 1 Visit                     Ozie Bo, PT, MPT    Erlene Hawks 01/13/2024, 2:03 PM

## 2024-01-13 NOTE — Progress Notes (Signed)
  Inpatient Rehabilitation Admissions Coordinator   Spoke with patient by phone for rehab assessment. We discussed goals and expectations of a possible CIR admit. He prefers CIR for rehab. He will have to reach Mod I Level to return home alone. I will begin insurance Auth with [employer] for possible CIR admit pending approval. Note TEE delayed and to be done tomorrow. Patient reports going to bathroom with RW in room alone since admission. I discussed the need to always call and have staff supervise him when up ambulating. Refer also to Dr Delfin Fell consult today.  Please call me with any questions.   Jeannetta Millman, RN, MSN Rehab Admissions Coordinator [phone]

## 2024-01-13 NOTE — Progress Notes (Addendum)
 Physical Medicine & Rehabilitation Consult Service  Pt discussed with rehab admissions coordinator. Chart has been reviewed. This is a 53 yo male who presented on 5/23 with acute onset left hemiparesis. MRI positive for acute infarct of right PLIC, right frontal white matter, and right pons. 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 TEE tomorrow. ASA 81mg  + plavix 75mg  for 21 days followed by ASA alone recommended at this time. Pt up with therapy yesterday and today. Was min to mod assist for gait 200' using RW and min to contact guard for sit to std transfer. Pt min assist for basic ADL's.  Pt lives alone in a one level home with one step to enter. He was independent and working PTA.   Home: Home Living Family/patient expects to be discharged to:: Private residence Living Arrangements: Alone Type of Home: House Home Access: Stairs to enter Secretary/administrator of Steps: 1 Home Layout: One level Home Equipment: None  Lives With: Alone  Functional History: Prior Function Prior Level of Function : Independent/Modified Independent, Working/employed, Driving Functional Status:  Mobility: Bed Mobility Overal bed mobility: Needs Assistance Bed Mobility: Supine to Sit, Sit to Supine Supine to sit: Supervision Sit to supine: Supervision General bed mobility comments: no physical assistance however ot neglecting LUE. Poor overall coordination of movements Transfers Overall transfer level: Needs assistance Equipment used: Rolling walker (2 wheels) Transfers: Sit to/from Stand Sit to Stand: Min assist General transfer comment: Pt was able to stand with and without AD. Pt very unsteady wiuthout UE support Ambulation/Gait Ambulation/Gait assistance: Mod assist, Min assist Gait Distance (Feet): 200 Feet Assistive device: None, 1 person hand held assist, Rolling walker (2 wheels) Gait Pattern/deviations: Staggering left General Gait Details: Pt ambulated 200 ft with RW however attempted ambulation with  only +1 UE support and have 3 occasions of LOB with intervention to prevent falling. PT needs constant vcs to not neglect LUE on RW. poor ability to maintain grip on RW. pt remains high fall risk and was encouraged to use RW at all times until balamnce improves Gait velocity: decreased    ADL: ADL Overall ADL's : Needs assistance/impaired Grooming: Oral care, Standing, Minimal assistance Lower Body Dressing: Supervision/safety, Sit to/from stand Lower Body Dressing Details (indicate cue type and reason): Donning/doffing bilateral socks while seated on EOB Toilet Transfer: Ambulation, Contact guard assist Toilet Transfer Details (indicate cue type and reason): Simulated toilet t/f Functional mobility during ADLs: Contact guard assist General ADL Comments: Completes seated ADL tasks with no physical assistance, standing grooming tasks anticipate CGA for safety  Cognition: Cognition Overall Cognitive Status: Within Functional Limits for tasks assessed (family not present to verify baseline) Arousal/Alertness: Awake/alert Orientation Level: Oriented X4 Attention: Sustained Sustained Attention: Appears intact Awareness: Appears intact Problem Solving: Appears intact Safety/Judgment: Appears intact Cognition Arousal: Alert Behavior During Therapy: WFL for tasks assessed/performed Overall Cognitive Status: Within Functional Limits for tasks assessed (family not present to verify baseline)   Assessment: Right PLIC, frontal and pontine infarcts, likely embolic   Plan:  This patient would benefit from acute inpatient rehab to address mobility, balance, self-care. Additionally, the patient requires daily MD oversight of the active medical issues noted above. Projected goals would be mod I with an ELOS of 7-10 days.  It appears that he needs to be essentially  modified independent to return home.     Rehab Admissions Coordinator to follow up.    Rawland Caddy, MD, [facility]  Health Physical Medicine & Rehabilitation Medical Director Rehabilitation Services 01/13/2024

## 2024-01-13 NOTE — Progress Notes (Addendum)
 [facility] CLINIC CARDIOLOGY PROGRESS NOTE   Patient ID: Mark Mcintosh MRN: [id number] DOB/AGE: Mar 18, 1971 53 y.o.  Admit date: 01/09/2024 Referring Physician Dr. Brion Cancel Primary Physician Pcp, No Primary Cardiologist None Reason for Consultation Possible shunt CVA  HPI: Esequiel Kleinfelter is a 53 y.o. male with a past medical history of hypertension, tobacco use disorder and no known cardiac history who presented to the ED on 01/09/2024 for left-sided weakness. Brain MRI revealed acute infarct in posterior limb. 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 Cardiology consulted for further evaluation.  Interval History: -Patient seen and examined this AM and laying comfortably in hospital bed. Patient states he feels well this AM and denies chest pain, SOB, or orther cardiac sxs.  -Patients BP and HR stable this AM. Overnight Tele showed no  significant events.  -Patient remains on room air with stable SpO2.  -TEE planned tomorrow at 12 PM with Dr. Bob Burn and patient agreeable.   Review of systems complete and found to be negative unless listed above    Vitals:   01/12/24 2100 01/13/24 0015 01/13/24 0426 01/13/24 0751  BP: (!) 151/94 (!) 126/110 (!) 137/112 (!) 116/98  Pulse: 75 79 83 91  Resp: 16 18 18 16   Temp: 98.8 F (37.1 C) 99 F (37.2 C) 99 F (37.2 C) 98.6 F (37 C)  TempSrc:  Oral  Oral  SpO2: 95% 98% 92% 99%  Weight:      Height:        No intake or output data in the 24 hours ending 01/13/24 1125   PHYSICAL EXAM General: well appearing male, well nourished, in no acute distress. HEENT: Normocephalic and atraumatic. Neck: No JVD.  Lungs: Normal respiratory effort on room air. Clear bilaterally to auscultation. No wheezes, crackles, rhonchi.  Heart: HRRR. Normal S1 and S2 without gallops or murmurs. Radial & DP pulses 2+ bilaterally. Abdomen: Non-distended appearing.  Msk: Normal strength and tone for age. Extremities: No clubbing, cyanosis or edema.    Neuro: Alert and oriented X 3. Psych: Mood appropriate, affect congruent.    LABS: Basic Metabolic Panel: Recent Labs    01/11/24 1436 01/13/24 0755  NA  --  138  K 3.5 3.5  CL  --  102  CO2  --  26  GLUCOSE  --  100*  BUN  --  16  CREATININE  --  0.99  CALCIUM  --  9.0   Liver Function Tests: No results for input(s): "AST", "ALT", "ALKPHOS", "BILITOT", "PROT", "ALBUMIN" in the last 72 hours. No results for input(s): "LIPASE", "AMYLASE" in the last 72 hours. CBC: Recent Labs    01/13/24 0755  WBC 5.7  HGB 14.5  HCT 44.3  MCV 91.9  PLT 281   Cardiac Enzymes: No results for input(s): "CKTOTAL", "CKMB", "CKMBINDEX", "TROPONINIHS" in the last 72 hours. BNP: No results for input(s): "BNP" in the last 72 hours. D-Dimer: No results for input(s): "DDIMER" in the last 72 hours. Hemoglobin A1C: No results for input(s): "HGBA1C" in the last 72 hours. Fasting Lipid Panel: No results for input(s): "CHOL", "HDL", "LDLCALC", "TRIG", "CHOLHDL", "LDLDIRECT" in the last 72 hours. Thyroid Function Tests: No results for input(s): "TSH", "T4TOTAL", "T3FREE", "THYROIDAB" in the last 72 hours.  Invalid input(s): "FREET3" Anemia Panel: No results for input(s): "VITAMINB12", "FOLATE", "FERRITIN", "TIBC", "IRON", "RETICCTPCT" in the last 72 hours.  No results found.   ECHO 01/10/2024   1. Left ventricular ejection fraction, by estimation, is 55 to 60%. The left ventricle has  normal function. The left ventricle has no regional wall motion abnormalities. Left ventricular diastolic parameters are indeterminate.   2. Right ventricular systolic function is normal. The right ventricular size is normal. There is mildly elevated pulmonary artery systolic pressure.   3. The mitral valve is normal in structure. Trivial mitral valve regurgitation. No evidence of mitral stenosis.   4. The aortic valve is normal in structure. Aortic valve regurgitation is trivial. No aortic stenosis is present.    5. The inferior vena cava is normal in size with greater than 50% respiratory variability, suggesting right atrial pressure of 3 mmHg.   6. Agitated saline contrast bubble study was positive with shunting observed after >6 cardiac cycles suggestive of intrapulmonary shunting.   TELEMETRY reviewed by me 01/13/24: sinus rhythm, rate 70s  EKG reviewed by me 01/13/24: sinus rhythm, rate 90 bpm  DATA reviewed by me 01/13/24: last 24h vitals tele labs imaging I/O hospital progress note.  Principal Problem:   Acute CVA (cerebrovascular accident) [facility]) Active Problems:   Tobacco use disorder   Hypertension   Obesity (BMI 30-39.9)    ASSESSMENT AND PLAN: Mark Mcintosh is a 53 y.o. male with a past medical history of hypertension, tobacco use disorder and no known cardiac history who presented to the ED on 01/09/2024 for left-sided weakness. Brain MRI revealed acute infarct in posterior limb. 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 Cardiology consulted for further evaluation.  # Acute Ischemic CVA # Hypertension # Tobacco use disorder Patient presented to ED with left sided weakness. MRI brain revealed acute infarct in posterior limb of the right internal capsule. EKG without acute ischemic changes. Echo this admission with pEF (55-60%), no RWMA, positive bubble study with shunting observed after > 6 cardiac cycles.  -Continue amlodipine  5 mg daily -Continue ASA, atorvastatin 40 mg, plavix 75 mg daily -Discussed the risks and benefits of proceeding with TEE for further evaluation shunt vs patent PFO   He is agreeable to proceed.  NPO until TEE tomorrow (05/28 @ 12 PM) with Dr. Bob Burn.  Written consent will be obtained.    This patient's case was discussed and created with Dr. Custovic and she is in agreement.  Signed:  Creighton Doffing, PA-C  01/13/2024, 11:25 AM [facility] Cardiology

## 2024-01-13 NOTE — Progress Notes (Addendum)
 Progress Note    Mark Mcintosh  ZOX:[id number] DOB: [birth year]  DOA: 01/09/2024 PCP: Pcp, No      Brief Narrative:    Medical records reviewed and are as summarized below:  Mark Mcintosh is a 53 y.o. male with medical history significant for hypertension, tobacco use disorder, who presented to the hospital because of left-sided weakness.   He was found to have acute stroke.   Assessment/Plan:   Principal Problem:   Acute CVA (cerebrovascular accident) (HCC) Active Problems:   Tobacco use disorder   Hypertension   Obesity (BMI 30-39.9)    Body mass index is 36.26 kg/m.  Class II obesity   Acute stroke: Infarct seen in the posterior limb of the right internal capsule, overlying right frontal white matter and pons.  Embolic etiology is suspected given multiple vascular territories.  There is also evidence of multiple remote infarcts and chronic microvascular ischemic disease. 2D echo showed EF estimated at 55 to 60%, indeterminate LV diastolic parameters, mildly elevated pulmonary artery systolic pressure, positive bubble study suggestive of extracardiac shunt/intrapulmonary shunt CTA head and neck did not show any large vessel occlusion. Venous duplex of bilateral lower extremity did not show any DVT. Lipid panel total cholesterol 185, triglyceride 117, HDL 42, LDL 120. Neurologist recommended dual antiplatelet therapy with aspirin  and Plavix  for 21 days total followed by aspirin  monotherapy. TEE could not be done today per cardiologist.  It has been rescheduled for tomorrow. PT recommended acute inpatient rehab Follow-up with physiatrist and TOC to assist with disposition.   Hypertension: Increase amlodipine  from 5 to 10 mg daily.   Prediabetes: Hemoglobin A1c 5.7.  Discussed diagnosis and management.  Encouraged regular exercise, weight loss and healthy eating habits   Hypokalemia: Improved   Tobacco use disorder: Counseled to quit smoking  cigarettes   Bilateral conjunctival inflammation: Continue prednisone  eyedrops.  Patient was seen by Dr. Ignatius Makos, ophthalmologist, in the ED for red eyes.  He was prescribed prednisone  eyedrops with plan to follow-up with with ophthalmologist in the office.    Diet Order             Diet NPO time specified  Diet effective midnight           Diet Heart Room service appropriate? Yes; Fluid consistency: Thin  Diet effective now                            Consultants: Neurologist Cardiologist  Procedures: None    Medications:    amLODipine   5 mg Oral Daily   aspirin  EC  81 mg Oral Daily   atorvastatin   40 mg Oral Daily   clopidogrel   75 mg Oral Daily   enoxaparin  (LOVENOX ) injection  0.5 mg/kg Subcutaneous Q24H   nicotine   21 mg Transdermal Daily   prednisoLONE  acetate  1 drop Both Eyes QID   sodium chloride  flush  3-10 mL Intravenous Q12H   Continuous Infusions:   Anti-infectives (From admission, onward)    None              Family Communication/Anticipated D/C date and plan/Code Status   DVT prophylaxis:      Code Status: Full Code  Family Communication: Plan of care was discussed with Belinda (his nurse, friend) and Stana Ear (Belinda's mother) at the bedside. Disposition Plan: Plan to discharge home   Status is: Observation The patient will require care spanning > 2 midnights and should be moved to  inpatient because: Acute stroke, workup in progress       Subjective:   Interval events noted.  No complaints.  He has been sleeping most of the time.  He said he usually works at night and tends to sleep during the day so that may be the reason why he is so sleepy during the day.  Objective:    Vitals:   01/13/24 0015 01/13/24 0426 01/13/24 0751 01/13/24 1126  BP: (!) 126/110 (!) 137/112 (!) 116/98 (!) 126/108  Pulse: 79 83 91 75  Resp: 18 18 16 20   Temp: 99 F (37.2 C) 99 F (37.2 C) 98.6 F (37 C) 98.9 F (37.2 C)   TempSrc: Oral  Oral Oral  SpO2: 98% 92% 99% 100%  Weight:      Height:       No data found.  No intake or output data in the 24 hours ending 01/13/24 1401 Filed Weights   01/09/24 1730  Weight: 117.9 kg    Exam:  GEN: NAD SKIN: Warm and dry EYES: No pallor or icterus, bilateral red eyes without any discharge ENT: MMM CV: RRR PULM: CTA B ABD: soft, ND, NT, +BS CNS: AAO x 3, mild left facial droop, probably very mild left leg weakness EXT: No edema or tenderness        Data Reviewed:   I have personally reviewed following labs and imaging studies:  Labs: Labs show the following:   Basic Metabolic Panel: Recent Labs  Lab 01/09/24 1741 01/11/24 1436 01/13/24 0755  NA 139  --  138  K 3.1* 3.5 3.5  CL 98  --  102  CO2 28  --  26  GLUCOSE 106*  --  100*  BUN 8  --  16  CREATININE 1.00  --  0.99  CALCIUM 9.0  --  9.0   GFR Estimated Creatinine Clearance: 114 mL/min (by C-G formula based on SCr of 0.99 mg/dL). Liver Function Tests: Recent Labs  Lab 01/09/24 1741  AST 20  ALT 13  ALKPHOS 61  BILITOT 0.9  PROT 8.1  ALBUMIN 3.9   No results for input(s): "LIPASE", "AMYLASE" in the last 168 hours. No results for input(s): "AMMONIA" in the last 168 hours. Coagulation profile Recent Labs  Lab 01/09/24 1741  INR 1.1    CBC: Recent Labs  Lab 01/09/24 1741 01/13/24 0755  WBC 5.6 5.7  NEUTROABS 3.5  --   HGB 14.3 14.5  HCT 44.5 44.3  MCV 94.1 91.9  PLT 277 281   Cardiac Enzymes: No results for input(s): "CKTOTAL", "CKMB", "CKMBINDEX", "TROPONINI" in the last 168 hours. BNP (last 3 results) No results for input(s): "PROBNP" in the last 8760 hours. CBG: Recent Labs  Lab 01/09/24 1749  GLUCAP 112*   D-Dimer: No results for input(s): "DDIMER" in the last 72 hours. Hgb A1c: No results for input(s): "HGBA1C" in the last 72 hours.  Lipid Profile: No results for input(s): "CHOL", "HDL", "LDLCALC", "TRIG", "CHOLHDL", "LDLDIRECT" in the last  72 hours.  Thyroid function studies: No results for input(s): "TSH", "T4TOTAL", "T3FREE", "THYROIDAB" in the last 72 hours.  Invalid input(s): "FREET3" Anemia work up: No results for input(s): "VITAMINB12", "FOLATE", "FERRITIN", "TIBC", "IRON", "RETICCTPCT" in the last 72 hours. Sepsis Labs: Recent Labs  Lab 01/09/24 1741 01/13/24 0755  WBC 5.6 5.7    Microbiology No results found for this or any previous visit (from the past 240 hours).  Procedures and diagnostic studies:  No results found.  LOS: 0 days   Kenton Fortin  [facility]   Pager on [email or web address]. If 7PM-7AM, please contact night-coverage at [email or web address]     01/13/2024, 2:01 PM

## 2024-01-13 NOTE — Plan of Care (Signed)

## 2024-01-13 NOTE — Progress Notes (Signed)
 Occupational Therapy Treatment Patient Details Name: Mark Mcintosh MRN: [id number] DOB: Jun 26, 1971 Today's Date: 01/13/2024   History of present illness presented to ER secondary to acute onset of L-sided weakness; admitted for TIA/CVA work-up.  MRI significant for acute infarcts to posterior limb of internal capsule, R frontal white matter and pons.   OT comments  Upon entering the room, pt supine in bed and sleeping soundly. Pt is agreeable to OT intervention. He performs bed mobility with supervision with cues for proper technique. Pt stands with min A from EOB and ambulates to bathroom with min A and use of RW. Min -mod multimodal cues to navigate space and items on the L. Pt standing at sink for grooming tasks. Hand over hand assistance to utilize L UE in functional tasks and if not in use placed into weight bearing position. All self care items placed on L side of sink for visual scanning to locate. Pt then ambulates 100' in hall with RW before returning to bed at end of session. Call bell and all needed items within reach upon exiting the room.       If plan is discharge home, recommend the following:  A little help with walking and/or transfers;A little help with bathing/dressing/bathroom;Assistance with cooking/housework;Assist for transportation;Help with stairs or ramp for entrance   Equipment Recommendations  Other (comment) (defer to next venue of care)       Precautions / Restrictions Precautions Precautions: Fall       Mobility Bed Mobility Overal bed mobility: Needs Assistance Bed Mobility: Supine to Sit, Sit to Supine     Supine to sit: Supervision Sit to supine: Supervision        Transfers Overall transfer level: Needs assistance Equipment used: Rolling walker (2 wheels) Transfers: Sit to/from Stand Sit to Stand: Min assist                 Balance Overall balance assessment: Needs assistance Sitting-balance support: No upper extremity supported,  Feet supported Sitting balance-Leahy Scale: Fair     Standing balance support: No upper extremity supported, During functional activity, Reliant on assistive device for balance Standing balance-Leahy Scale: Poor                             ADL either performed or assessed with clinical judgement   ADL Overall ADL's : Needs assistance/impaired     Grooming: Oral care;Standing;Minimal assistance                                      Extremity/Trunk Assessment Upper Extremity Assessment Upper Extremity Assessment: Right hand dominant;LUE deficits/detail LUE Coordination: decreased fine motor;decreased gross motor            Vision   Additional Comments: L neglect         Communication Communication Communication: Impaired Factors Affecting Communication: Reduced clarity of speech   Cognition Arousal: Alert Behavior During Therapy: WFL for tasks assessed/performed Cognition: No family/caregiver present to determine baseline             OT - Cognition Comments: A/Ox4                 Following commands: Intact        Cueing   Cueing Techniques: Verbal cues, Tactile cues             Pertinent Vitals/ Pain  Pain Assessment Pain Assessment: No/denies pain         Frequency  Min 3X/week        Progress Toward Goals  OT Goals(current goals can now be found in the care plan section)  Progress towards OT goals: Progressing toward goals      AM-PAC OT "6 Clicks" Daily Activity     Outcome Measure   Help from another person eating meals?: None Help from another person taking care of personal grooming?: A Little Help from another person toileting, which includes using toliet, bedpan, or urinal?: A Little Help from another person bathing (including washing, rinsing, drying)?: A Little Help from another person to put on and taking off regular upper body clothing?: A Little Help from another person to put on and  taking off regular lower body clothing?: A Little 6 Click Score: 19    End of Session Equipment Utilized During Treatment: Rolling walker (2 wheels)  OT Visit Diagnosis: Unsteadiness on feet (R26.81);Other abnormalities of gait and mobility (R26.89);Muscle weakness (generalized) (M62.81);Hemiplegia and hemiparesis Hemiplegia - Right/Left: Left Hemiplegia - dominant/non-dominant: Non-Dominant   Activity Tolerance Patient tolerated treatment well   Patient Left in bed;with call bell/phone within reach   Nurse Communication Mobility status        Time: 1610-9604 OT Time Calculation (min): 19 min  Charges: OT General Charges $OT Visit: 1 Visit OT Treatments $Therapeutic Activity: 8-22 mins  George Kinder, MS, OTR/L , CBIS ascom [phone]  01/13/24, 11:52 AM

## 2024-01-13 NOTE — Plan of Care (Signed)
 TEE remains pending   A. Bonnita Buttner, MD Neurology

## 2024-01-13 NOTE — Plan of Care (Signed)
 Patient has remained NPO after midnight for scheduled TEE.  Patient remains free from any noted signs of acute distress.  Has remains free from any reported pain/discomfort.  No additional signs of CVA/ cardiac complications.  Patient to continue to be monitored by hospital staff until discharge.

## 2024-01-13 NOTE — Progress Notes (Signed)
 Patient transported for TEE procedure in bed by transport in stable condition at 11441.  Due to an emergency, procedure could not be completed and patient was returned in bed in stable conditio at 1205.

## 2024-01-14 ENCOUNTER — Inpatient Hospital Stay: Admitting: Certified Registered"

## 2024-01-14 ENCOUNTER — Encounter (HOSPITAL_COMMUNITY): Payer: Self-pay | Admitting: Physical Medicine & Rehabilitation

## 2024-01-14 ENCOUNTER — Inpatient Hospital Stay
Admit: 2024-01-14 | Discharge: 2024-01-14 | Disposition: A | Discharge status: Home / Self Care | Attending: Internal Medicine | Admitting: Internal Medicine

## 2024-01-14 ENCOUNTER — Encounter
Admission: EM | Disposition: A | Discharge status: Rehabilitation Facility | Payer: Self-pay | Source: Home / Self Care | Attending: Internal Medicine

## 2024-01-14 ENCOUNTER — Inpatient Hospital Stay (HOSPITAL_COMMUNITY)
Admission: AD | Admit: 2024-01-14 | Discharge: 2024-01-30 | DRG: 057 | Disposition: A | Discharge status: Home / Self Care | Source: Other Acute Inpatient Hospital | Attending: Physical Medicine & Rehabilitation | Admitting: Physical Medicine & Rehabilitation

## 2024-01-14 ENCOUNTER — Other Ambulatory Visit: Payer: Self-pay

## 2024-01-14 DIAGNOSIS — R350 Frequency of micturition: Secondary | ICD-10-CM | POA: Diagnosis not present

## 2024-01-14 DIAGNOSIS — F54 Psychological and behavioral factors associated with disorders or diseases classified elsewhere: Secondary | ICD-10-CM

## 2024-01-14 DIAGNOSIS — R471 Dysarthria and anarthria: Secondary | ICD-10-CM | POA: Diagnosis present

## 2024-01-14 DIAGNOSIS — F17290 Nicotine dependence, other tobacco product, uncomplicated: Secondary | ICD-10-CM | POA: Diagnosis present

## 2024-01-14 DIAGNOSIS — R3915 Urgency of urination: Secondary | ICD-10-CM | POA: Diagnosis present

## 2024-01-14 DIAGNOSIS — G4489 Other headache syndrome: Secondary | ICD-10-CM | POA: Diagnosis not present

## 2024-01-14 DIAGNOSIS — F17210 Nicotine dependence, cigarettes, uncomplicated: Secondary | ICD-10-CM | POA: Diagnosis present

## 2024-01-14 DIAGNOSIS — H109 Unspecified conjunctivitis: Secondary | ICD-10-CM | POA: Diagnosis present

## 2024-01-14 DIAGNOSIS — K5901 Slow transit constipation: Secondary | ICD-10-CM | POA: Diagnosis not present

## 2024-01-14 DIAGNOSIS — Z7409 Other reduced mobility: Secondary | ICD-10-CM | POA: Diagnosis present

## 2024-01-14 DIAGNOSIS — R29708 NIHSS score 8: Secondary | ICD-10-CM | POA: Diagnosis present

## 2024-01-14 DIAGNOSIS — W19XXXA Unspecified fall, initial encounter: Secondary | ICD-10-CM | POA: Diagnosis not present

## 2024-01-14 DIAGNOSIS — I69354 Hemiplegia and hemiparesis following cerebral infarction affecting left non-dominant side: Secondary | ICD-10-CM | POA: Diagnosis present

## 2024-01-14 DIAGNOSIS — E559 Vitamin D deficiency, unspecified: Secondary | ICD-10-CM | POA: Diagnosis present

## 2024-01-14 DIAGNOSIS — I69322 Dysarthria following cerebral infarction: Secondary | ICD-10-CM | POA: Diagnosis not present

## 2024-01-14 DIAGNOSIS — Z716 Tobacco abuse counseling: Secondary | ICD-10-CM

## 2024-01-14 DIAGNOSIS — Z7901 Long term (current) use of anticoagulants: Secondary | ICD-10-CM | POA: Diagnosis not present

## 2024-01-14 DIAGNOSIS — N401 Enlarged prostate with lower urinary tract symptoms: Secondary | ICD-10-CM | POA: Diagnosis present

## 2024-01-14 DIAGNOSIS — J449 Chronic obstructive pulmonary disease, unspecified: Secondary | ICD-10-CM | POA: Diagnosis present

## 2024-01-14 DIAGNOSIS — I639 Cerebral infarction, unspecified: Secondary | ICD-10-CM | POA: Diagnosis not present

## 2024-01-14 DIAGNOSIS — Q2112 Patent foramen ovale: Secondary | ICD-10-CM | POA: Diagnosis not present

## 2024-01-14 DIAGNOSIS — F17200 Nicotine dependence, unspecified, uncomplicated: Secondary | ICD-10-CM | POA: Diagnosis not present

## 2024-01-14 DIAGNOSIS — R29810 Facial weakness: Secondary | ICD-10-CM | POA: Diagnosis present

## 2024-01-14 DIAGNOSIS — Z79899 Other long term (current) drug therapy: Secondary | ICD-10-CM | POA: Diagnosis not present

## 2024-01-14 DIAGNOSIS — I69392 Facial weakness following cerebral infarction: Secondary | ICD-10-CM | POA: Diagnosis not present

## 2024-01-14 DIAGNOSIS — I6350 Cerebral infarction due to unspecified occlusion or stenosis of unspecified cerebral artery: Secondary | ICD-10-CM | POA: Diagnosis present

## 2024-01-14 DIAGNOSIS — M1990 Unspecified osteoarthritis, unspecified site: Secondary | ICD-10-CM | POA: Diagnosis present

## 2024-01-14 DIAGNOSIS — Z7902 Long term (current) use of antithrombotics/antiplatelets: Secondary | ICD-10-CM | POA: Diagnosis not present

## 2024-01-14 DIAGNOSIS — I10 Essential (primary) hypertension: Secondary | ICD-10-CM | POA: Diagnosis present

## 2024-01-14 DIAGNOSIS — K5900 Constipation, unspecified: Secondary | ICD-10-CM | POA: Diagnosis not present

## 2024-01-14 DIAGNOSIS — R400 Somnolence: Secondary | ICD-10-CM | POA: Diagnosis present

## 2024-01-14 DIAGNOSIS — E669 Obesity, unspecified: Secondary | ICD-10-CM | POA: Diagnosis not present

## 2024-01-14 DIAGNOSIS — Z6836 Body mass index (BMI) 36.0-36.9, adult: Secondary | ICD-10-CM | POA: Diagnosis not present

## 2024-01-14 DIAGNOSIS — Z7982 Long term (current) use of aspirin: Secondary | ICD-10-CM

## 2024-01-14 DIAGNOSIS — R7303 Prediabetes: Secondary | ICD-10-CM | POA: Diagnosis present

## 2024-01-14 DIAGNOSIS — E66812 Obesity, class 2: Secondary | ICD-10-CM | POA: Diagnosis present

## 2024-01-14 DIAGNOSIS — G8194 Hemiplegia, unspecified affecting left nondominant side: Secondary | ICD-10-CM | POA: Diagnosis present

## 2024-01-14 DIAGNOSIS — E785 Hyperlipidemia, unspecified: Secondary | ICD-10-CM | POA: Diagnosis present

## 2024-01-14 DIAGNOSIS — I6319 Cerebral infarction due to embolism of other precerebral artery: Secondary | ICD-10-CM | POA: Diagnosis not present

## 2024-01-14 DIAGNOSIS — Z789 Other specified health status: Secondary | ICD-10-CM | POA: Diagnosis not present

## 2024-01-14 DIAGNOSIS — E876 Hypokalemia: Secondary | ICD-10-CM | POA: Diagnosis present

## 2024-01-14 HISTORY — PX: TEE WITHOUT CARDIOVERSION: SHX5443

## 2024-01-14 LAB — CBC
HCT: 44.8 % (ref 39.0–52.0)
Hemoglobin: 14.6 g/dL (ref 13.0–17.0)
MCH: 30.3 pg (ref 26.0–34.0)
MCHC: 32.6 g/dL (ref 30.0–36.0)
MCV: 92.9 fL (ref 80.0–100.0)
Platelets: 280 10*3/uL (ref 150–400)
RBC: 4.82 MIL/uL (ref 4.22–5.81)
RDW: 13.6 % (ref 11.5–15.5)
WBC: 5.9 10*3/uL (ref 4.0–10.5)
nRBC: 0 % (ref 0.0–0.2)

## 2024-01-14 LAB — ECHO TEE

## 2024-01-14 SURGERY — ECHOCARDIOGRAM, TRANSESOPHAGEAL
Anesthesia: General

## 2024-01-14 MED ORDER — LIDOCAINE VISCOUS HCL 2 % MT SOLN
OROMUCOSAL | Status: AC
  Filled 2024-01-14: qty 15

## 2024-01-14 MED ORDER — NICOTINE 21 MG/24HR TD PT24: 21.0000 mg | MEDICATED_PATCH | Freq: Every day | TRANSDERMAL | Status: DC

## 2024-01-14 MED ORDER — ATORVASTATIN CALCIUM 40 MG PO TABS: 40.0000 mg | ORAL_TABLET | Freq: Every day | ORAL | Status: DC

## 2024-01-14 MED ORDER — ASPIRIN 81 MG PO TBEC
81.0000 mg | DELAYED_RELEASE_TABLET | Freq: Every day | ORAL | Status: DC
  Administered 2024-01-15 – 2024-01-30 (×16): 81 mg via ORAL
  Filled 2024-01-14 (×16): qty 1

## 2024-01-14 MED ORDER — BUTAMBEN-TETRACAINE-BENZOCAINE 2-2-14 % EX AERO
1.0000 | INHALATION_SPRAY | Freq: Once | CUTANEOUS | Status: AC
  Administered 2024-01-14: 1 via TOPICAL

## 2024-01-14 MED ORDER — PREDNISOLONE ACETATE 1 % OP SUSP
1.0000 [drp] | Freq: Four times a day (QID) | OPHTHALMIC | Status: AC
  Administered 2024-01-14 – 2024-01-17 (×11): 1 [drp] via OPHTHALMIC
  Filled 2024-01-14 (×2): qty 5

## 2024-01-14 MED ORDER — ACETAMINOPHEN 160 MG/5ML PO SOLN: 650.0000 mg | ORAL | Status: DC | PRN

## 2024-01-14 MED ORDER — AMLODIPINE BESYLATE 10 MG PO TABS
10.0000 mg | ORAL_TABLET | Freq: Every day | ORAL | Status: DC
  Administered 2024-01-15 – 2024-01-23 (×9): 10 mg via ORAL
  Filled 2024-01-14 (×10): qty 1

## 2024-01-14 MED ORDER — NICOTINE 21 MG/24HR TD PT24
21.0000 mg | MEDICATED_PATCH | Freq: Every day | TRANSDERMAL | Status: DC
  Administered 2024-01-15 – 2024-01-30 (×16): 21 mg via TRANSDERMAL
  Filled 2024-01-14 (×16): qty 1

## 2024-01-14 MED ORDER — KETOROLAC TROMETHAMINE 30 MG/ML IJ SOLN
30.0000 mg | Freq: Once | INTRAMUSCULAR | Status: AC
  Administered 2024-01-14: 30 mg via INTRAVENOUS
  Filled 2024-01-14: qty 1

## 2024-01-14 MED ORDER — IRBESARTAN 75 MG PO TABS
75.0000 mg | ORAL_TABLET | Freq: Every day | ORAL | Status: DC
  Administered 2024-01-15 – 2024-01-30 (×16): 75 mg via ORAL
  Filled 2024-01-14 (×16): qty 1

## 2024-01-14 MED ORDER — ACETAMINOPHEN 650 MG RE SUPP: 650.0000 mg | RECTAL | Status: DC | PRN

## 2024-01-14 MED ORDER — AMLODIPINE BESYLATE 10 MG PO TABS: 10.0000 mg | ORAL_TABLET | Freq: Every day | ORAL | Status: DC

## 2024-01-14 MED ORDER — ENOXAPARIN SODIUM 60 MG/0.6ML IJ SOSY: 60.0000 mg | PREFILLED_SYRINGE | INTRAMUSCULAR | Status: DC

## 2024-01-14 MED ORDER — CLOPIDOGREL BISULFATE 75 MG PO TABS
75.0000 mg | ORAL_TABLET | Freq: Every day | ORAL | Status: AC
Start: 2024-01-14 — End: 2024-02-04

## 2024-01-14 MED ORDER — SODIUM CHLORIDE 0.9% FLUSH
INTRAVENOUS | Status: DC | PRN
  Administered 2024-01-14 (×2): 5 mL via INTRAVENOUS
  Administered 2024-01-14 (×2): 10 mL via INTRAVENOUS
  Administered 2024-01-14: 5 mL via INTRAVENOUS

## 2024-01-14 MED ORDER — ENOXAPARIN SODIUM 60 MG/0.6ML IJ SOSY
0.5000 mg/kg | PREFILLED_SYRINGE | INTRAMUSCULAR | Status: DC
  Administered 2024-01-14: 60 mg via SUBCUTANEOUS
  Filled 2024-01-14: qty 0.6

## 2024-01-14 MED ORDER — CLOPIDOGREL BISULFATE 75 MG PO TABS
75.0000 mg | ORAL_TABLET | Freq: Every day | ORAL | Status: DC
  Administered 2024-01-15 – 2024-01-30 (×16): 75 mg via ORAL
  Filled 2024-01-14 (×16): qty 1

## 2024-01-14 MED ORDER — ATORVASTATIN CALCIUM 40 MG PO TABS
40.0000 mg | ORAL_TABLET | Freq: Every day | ORAL | Status: DC
  Administered 2024-01-15 – 2024-01-30 (×16): 40 mg via ORAL
  Filled 2024-01-14 (×16): qty 1

## 2024-01-14 MED ORDER — ASPIRIN 81 MG PO TBEC: 81.0000 mg | DELAYED_RELEASE_TABLET | Freq: Every day | ORAL | Status: DC

## 2024-01-14 MED ORDER — PROPOFOL 10 MG/ML IV BOLUS
INTRAVENOUS | Status: DC | PRN
  Administered 2024-01-14 (×4): 20 mg via INTRAVENOUS
  Administered 2024-01-14: 50 mg via INTRAVENOUS

## 2024-01-14 MED ORDER — ACETAMINOPHEN 325 MG PO TABS
650.0000 mg | ORAL_TABLET | ORAL | Status: DC | PRN
  Administered 2024-01-17 – 2024-01-30 (×12): 650 mg via ORAL
  Filled 2024-01-14 (×13): qty 2

## 2024-01-14 MED ORDER — BUTAMBEN-TETRACAINE-BENZOCAINE 2-2-14 % EX AERO
INHALATION_SPRAY | CUTANEOUS | Status: AC
  Filled 2024-01-14: qty 5

## 2024-01-14 NOTE — Progress Notes (Signed)
   01/14/24 1922  What Happened  Was fall witnessed? No  Was patient injured? No  Patient found in bathroom  Found by Staff-comment  Stated prior activity bathroom-unassisted  Provider Notification  Provider Name/Title Obed Bellows, PA-C  Date Provider Notified 01/14/24  Time Provider Notified 1930  Method of Notification Call  Notification Reason Fall  Provider response No new orders  Date of Provider Response 01/14/24  Time of Provider Response 1930  Follow Up  Family notified Yes - comment  Time family notified 1939  Additional tests No  Progress note created (see row info) Yes  Adult Fall Risk Assessment  Risk Factor Category (scoring not indicated) Fall has occurred during this admission (document High fall risk);High fall risk per protocol (document High fall risk)  Patient Fall Risk Level High fall risk  Adult Fall Risk Interventions  Required Bundle Interventions *See Row Information* High fall risk - low, moderate, and high requirements implemented  Additional Interventions Lap belt while in chair/wheelchair (Rehab only);Use of appropriate toileting equipment (bedpan, BSC, etc.)  Fall intervention(s) refused/Patient educated regarding refusal Bed alarm;Nonskid socks;Open door if unsupervised;Yellow bracelet;Supervision while toileting/edge of bed sitting  Screening for Fall Injury Risk (To be completed on HIGH fall risk patients) - Assessing Need for Floor Mats  Risk For Fall Injury- Criteria for Floor Mats Previous fall this admission  Will Implement Floor Mats Yes  Vitals  Temp 98.2 F (36.8 C)  Temp Source Oral  BP (!) 139/109  MAP (mmHg) 120  BP Location Right Arm  BP Method Automatic  Patient Position (if appropriate) Lying  Pulse Rate 94  Pulse Rate Source Dinamap  Resp 18  Oxygen Therapy  SpO2 98 %  O2 Device Room Air  Pain Assessment  Pain Scale 0-10  Pain Score 0  PCA/Epidural/Spinal Assessment  Respiratory Pattern Regular;Unlabored  Neurological   Neuro (WDL) WDL  Level of Consciousness Alert  Orientation Level Oriented X4  Cognition Appropriate at baseline  Speech Clear  R Hand Grip Strong  L Hand Grip Weak   R Foot Dorsiflexion Strong  L Foot Dorsiflexion Strong  R Foot Plantar Flexion Strong  L Foot Plantar Flexion Strong  RUE Motor Response Purposeful movement  RUE Sensation Full sensation  LUE Motor Response Purposeful movement  LUE Sensation Full sensation  RLE Motor Response Purposeful movement  RLE Sensation Full sensation  LLE Motor Response Purposeful movement  LLE Sensation Full sensation  Neuro Symptoms Fatigue  Neuro symptoms relieved by Rest  Musculoskeletal  Musculoskeletal (WDL) X  Assistive Device Wheelchair;Front wheel walker  Generalized Weakness Yes  Weight Bearing Restrictions Per Provider Order No  Musculoskeletal Details  RUE Full movement  LUE Limited movement  RLE Full movement  LLE Full movement  Integumentary  Integumentary (WDL) WDL  Skin Color Appropriate for ethnicity  Skin Condition Dry  Skin Integrity Intact  Skin Turgor Non-tenting

## 2024-01-14 NOTE — Anesthesia Postprocedure Evaluation (Signed)
 Anesthesia Post Note  Patient: Mark Mcintosh  Procedure(s) Performed: ECHOCARDIOGRAM, TRANSESOPHAGEAL  Patient location during evaluation: Specials Recovery Anesthesia Type: General Level of consciousness: awake and alert Pain management: pain level controlled Vital Signs Assessment: post-procedure vital signs reviewed and stable Respiratory status: spontaneous breathing, nonlabored ventilation, respiratory function stable and patient connected to nasal cannula oxygen  Cardiovascular status: blood pressure returned to baseline and stable Postop Assessment: no apparent nausea or vomiting Anesthetic complications: no   No notable events documented.   Last Vitals:  Vitals:   01/14/24 1300 01/14/24 1315  BP: (!) 126/103 (!) 126/99  Pulse: 87 84  Resp: (!) 29 (!) 28  Temp:  37 C  SpO2: 95% 95%    Last Pain:  Vitals:   01/14/24 1315  TempSrc: Oral  PainSc: 0-No pain                 Portia Brittle Chaneka Trefz

## 2024-01-14 NOTE — Discharge Instructions (Addendum)
 Inpatient Rehab Discharge Instructions  Mark Mcintosh Discharge date and time: No discharge date for patient encounter.   Activities/Precautions/ Functional Status: Activity: As tolerated Diet: Regular Wound Care: Routine skin checks Functional status:  ___ No restrictions     ___ Walk up steps independently ___ 24/7 supervision/assistance   ___ Walk up steps with assistance ___ Intermittent supervision/assistance  ___ Bathe/dress independently ___ Walk with walker     _x__ Bathe/dress with assistance ___ Walk Independently    ___ Shower independently ___ Walk with assistance    ___ Shower with assistance ___ No alcohol     ___ Return to work/school ________  Special Instructions: No driving smoking or alcohol   COMMUNITY REFERRALS UPON DISCHARGE:     Outpatient: PT      OT              Agency:[facility]   Phone:[phone]              Appointment Date/Time:*Please expect follow-up within 7-10 business days to schedule your appointment. If you have not received follow-up, be sure to contact the site directly.*   Medical Equipment/Items Ordered:tub transfer bench and rollator                                                 Agency/Supplier:[facility] [phone]    My questions have been answered and I understand these instructions. I will adhere to these goals and the provided educational materials after my discharge from the hospital.  Patient/Caregiver Signature _______________________________ Date __________  Clinician Signature _______________________________________ Date __________  Please bring this form and your medication list with you to all your follow-up doctor's appointments. STROKE/TIA DISCHARGE INSTRUCTIONS SMOKING Cigarette smoking nearly doubles your risk of having a stroke & is the single most alterable risk factor  If you smoke or have smoked in the last 12 months, you are advised to quit smoking for your health. Most of the  excess cardiovascular risk related to smoking disappears within a year of stopping. Ask you doctor about anti-smoking medications [place] Quit Line: 1-800-QUIT NOW Free Smoking Cessation Classes [phone]  CHOLESTEROL Know your levels; limit fat & cholesterol in your diet  Lipid Panel     Component Value Date/Time   CHOL 185 01/10/2024 0533   TRIG 117 01/10/2024 0533   HDL 42 01/10/2024 0533   CHOLHDL 4.4 01/10/2024 0533   VLDL 23 01/10/2024 0533   LDLCALC 120 (H) 01/10/2024 0533     Many patients benefit from treatment even if their cholesterol is at goal. Goal: Total Cholesterol (CHOL) less than 160 Goal:  Triglycerides (TRIG) less than 150 Goal:  HDL greater than 40 Goal:  LDL (LDLCALC) less than 100   BLOOD PRESSURE American Stroke Association blood pressure target is less that 120/80 mm/Hg  Your discharge blood pressure is:  BP: 122/74 Monitor your blood pressure Limit your salt and alcohol intake Many individuals will require more than one medication for high blood pressure  DIABETES (A1c is a blood sugar average for last 3 months) Goal HGBA1c is under 7% (HBGA1c is blood sugar average for last 3 months)  Diabetes: No known diagnosis of diabetes    Lab Results  Component Value Date   HGBA1C 5.7 (H) 01/10/2024    Your HGBA1c can be lowered with medications, healthy diet, and exercise. Check your blood  sugar as directed by your physician Call your physician if you experience unexplained or low blood sugars.  PHYSICAL ACTIVITY/REHABILITATION Goal is 30 minutes at least 4 days per week  Activity: Increase activity slowly, Therapies: Physical Therapy: Home Health Return to work:  Activity decreases your risk of heart attack and stroke and makes your heart stronger.  It helps control your weight and blood pressure; helps you relax and can improve your mood. Participate in a regular exercise program. Talk with your doctor about the best form of exercise for you (dancing,  walking, swimming, cycling).  DIET/WEIGHT Goal is to maintain a healthy weight  Your discharge diet is:  Diet Order             Diet Heart Room service appropriate? Yes; Fluid consistency: Thin  Diet effective now                   liquids Your height is:  Height: 5' 11 (180.3 cm) Your current weight is: Weight: 98 kg Your Body Mass Index (BMI) is:  BMI (Calculated): 30.15 Following the type of diet specifically designed for you will help prevent another stroke. Your goal weight range is:   Your goal Body Mass Index (BMI) is 19-24. Healthy food habits can help reduce 3 risk factors for stroke:  High cholesterol, hypertension, and excess weight.  RESOURCES Stroke/Support Group:  Call [phone]   STROKE EDUCATION PROVIDED/REVIEWED AND GIVEN TO PATIENT Stroke warning signs and symptoms How to activate emergency medical system (call 911). Medications prescribed at discharge. Need for follow-up after discharge. Personal risk factors for stroke. Pneumonia vaccine given: No Flu vaccine given: No My questions have been answered, the writing is legible, and I understand these instructions.  I will adhere to these goals & educational materials that have been provided to me after my discharge from the hospital.

## 2024-01-14 NOTE — Progress Notes (Signed)
 [facility] CLINIC CARDIOLOGY PROGRESS NOTE   Patient ID: Mark Mcintosh MRN: [id number] DOB/AGE: Dec 25, 1970 53 y.o.  Admit date: 01/09/2024 Referring Physician Dr. Brion Cancel Primary Physician Pcp, No Primary Cardiologist None Reason for Consultation Possible shunt CVA  HPI: Mark Mcintosh is a 53 y.o. male with a past medical history of hypertension, tobacco use disorder and no known cardiac history who presented to the ED on 01/09/2024 for left-sided weakness. Brain MRI revealed acute infarct in posterior limb. 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 Cardiology consulted for further evaluation.  Interval History:   -Patient seen and examined this AM and laying comfortably in hospital bed. Patient states he feels tired this AM due to not sleeping well last night. Patient  denies chest pain, SOB, or orther cardiac sxs.  -Patients BP and HR stable this AM. Overnight Tele showed no significant events.  -Patient remains on room air with stable SpO2.  -TEE performed by Dr. Bob Burn. Small PFO found.  Review of systems complete and found to be negative unless listed above    Vitals:   01/13/24 2030 01/14/24 0040 01/14/24 0527 01/14/24 0739  BP: 120/89 (!) 138/96 (!) 125/104 109/87  Pulse: 85 84 96 82  Resp: 20  18 18   Temp: 99.1 F (37.3 C) 98.5 F (36.9 C) 98.8 F (37.1 C) 98 F (36.7 C)  TempSrc: Oral Oral Oral   SpO2: 99% 99% 98% 99%  Weight:      Height:        No intake or output data in the 24 hours ending 01/14/24 0853   PHYSICAL EXAM General: well appearing male, well nourished, in no acute distress. HEENT: Normocephalic and atraumatic. Neck: No JVD.  Lungs: Normal respiratory effort on room air. Clear bilaterally to auscultation. No wheezes, crackles, rhonchi.  Heart: HRRR. Normal S1 and S2 without gallops or murmurs. Radial & DP pulses 2+ bilaterally. Abdomen: Non-distended appearing.  Msk: Normal strength and tone for age. Extremities: No clubbing,  cyanosis or edema.   Neuro: Alert and oriented X 3. Psych: Mood appropriate, affect congruent.    LABS: Basic Metabolic Panel: Recent Labs    01/11/24 1436 01/13/24 0755  NA  --  138  K 3.5 3.5  CL  --  102  CO2  --  26  GLUCOSE  --  100*  BUN  --  16  CREATININE  --  0.99  CALCIUM  --  9.0   Liver Function Tests: No results for input(s): "AST", "ALT", "ALKPHOS", "BILITOT", "PROT", "ALBUMIN" in the last 72 hours. No results for input(s): "LIPASE", "AMYLASE" in the last 72 hours. CBC: Recent Labs    01/13/24 0755 01/14/24 0239  WBC 5.7 5.9  HGB 14.5 14.6  HCT 44.3 44.8  MCV 91.9 92.9  PLT 281 280   Cardiac Enzymes: No results for input(s): "CKTOTAL", "CKMB", "CKMBINDEX", "TROPONINIHS" in the last 72 hours. BNP: No results for input(s): "BNP" in the last 72 hours. D-Dimer: No results for input(s): "DDIMER" in the last 72 hours. Hemoglobin A1C: No results for input(s): "HGBA1C" in the last 72 hours. Fasting Lipid Panel: No results for input(s): "CHOL", "HDL", "LDLCALC", "TRIG", "CHOLHDL", "LDLDIRECT" in the last 72 hours. Thyroid Function Tests: No results for input(s): "TSH", "T4TOTAL", "T3FREE", "THYROIDAB" in the last 72 hours.  Invalid input(s): "FREET3" Anemia Panel: No results for input(s): "VITAMINB12", "FOLATE", "FERRITIN", "TIBC", "IRON", "RETICCTPCT" in the last 72 hours.  No results found.   ECHO 01/10/2024   1. Left ventricular ejection fraction, by  estimation, is 55 to 60%. The left ventricle has normal function. The left ventricle has no regional wall motion abnormalities. Left ventricular diastolic parameters are indeterminate.   2. Right ventricular systolic function is normal. The right ventricular size is normal. There is mildly elevated pulmonary artery systolic pressure.   3. The mitral valve is normal in structure. Trivial mitral valve regurgitation. No evidence of mitral stenosis.   4. The aortic valve is normal in structure. Aortic valve  regurgitation is trivial. No aortic stenosis is present.   5. The inferior vena cava is normal in size with greater than 50% respiratory variability, suggesting right atrial pressure of 3 mmHg.   6. Agitated saline contrast bubble study was positive with shunting observed after >6 cardiac cycles suggestive of intrapulmonary shunting.   TELEMETRY reviewed by me 01/14/24: sinus rhythm, rate 80s  EKG reviewed by me 01/14/24: sinus rhythm, rate 90 bpm  DATA reviewed by me 01/14/24: last 24h vitals tele labs imaging I/O hospital progress note, neurology note.  Principal Problem:   Acute CVA (cerebrovascular accident) [facility]) Active Problems:   Tobacco use disorder   Hypertension   Obesity (BMI 30-39.9)    ASSESSMENT AND PLAN: Mark Mcintosh is a 53 y.o. male with a past medical history of hypertension, tobacco use disorder and no known cardiac history who presented to the ED on 01/09/2024 for left-sided weakness. Brain MRI revealed acute infarct in posterior limb. 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 Cardiology consulted for further evaluation.  # Acute Ischemic CVA # Hypertension # Tobacco use disorder Patient presented to ED with left sided weakness. MRI brain revealed acute infarct in posterior limb of the right internal capsule. EKG without acute ischemic changes. Echo this admission with pEF (55-60%), no RWMA, positive bubble study with shunting observed after > 6 cardiac cycles. TEE performed by Dr. Bob Burn today (05/28) and small PFO found. -Increased amlodipine  to 10 mg daily. -Continue ASA, atorvastatin 40 mg, plavix 75 mg daily. -Patient tolerated TEE procedure well and revealed small PFO.  -Patient being transferred to [facility] for rehab this afternoon.  This patient's case was discussed and created with Dr. Custovic and she is in agreement.  Signed:  Creighton Doffing, PA-C  01/14/2024, 8:53 AM [facility]

## 2024-01-14 NOTE — H&P (Signed)
 Physical Medicine and Rehabilitation Admission H&P    Chief Complaint  Patient presents with   Headache  : HPI: Mark Mcintosh is a 53 year old right-handed male with history of bilateral conjunctival inflammation followed by ophthalmology services Dr. Ignatius Makos, hypertension as well as tobacco use and class II morbid obesity with BMI 36.26.  Per chart review patient lives alone.  1 level home one-step to enter.  Independent prior to admission.  Presented to [facility] 01/09/2024 with acute onset of left-sided weakness.  MRI showed acute infarcts in the posterior limb of the right internal capsule, the overlying right frontal white matter, and pons.  Multiple remote infarcts and chronic microvascular ischemic disease.  CTA showed no large vessel occlusion.  Admission chemistries unremarkable except potassium 3.1, hemoglobin A1c 5.7.  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  TEE with small PFO per cardiology services Dr Bob Burn and no plan for closure.  Neurology follow-up placed on aspirin and Plavix for CVA prophylaxis x 3 weeks then aspirin alone.  Lovenox for DVT prophylaxis and bilateral lower extremity Dopplers negative.  Therapy evaluations completed due to patient decreased functional ability left-sided weakness was admitted for a comprehensive rehab program. Currently ambulating MinA 320 feet.  Review of Systems  Constitutional:  Negative for chills and fever.  HENT:  Negative for hearing loss.   Eyes:  Negative for blurred vision and double vision.  Respiratory:  Negative for cough, shortness of breath and wheezing.   Cardiovascular:  Negative for chest pain, palpitations and leg swelling.  Gastrointestinal:  Positive for constipation. Negative for heartburn, nausea and vomiting.  Genitourinary:  Negative for dysuria, flank pain and hematuria.  Musculoskeletal:  Positive for myalgias.  Skin:  Negative for rash.  Neurological:  Positive for focal weakness and weakness.  All other  systems reviewed and are negative.  Past Medical History:  Diagnosis Date   Arthritis    Hypertension    Past Surgical History:  Procedure Laterality Date   ABDOMINAL SURGERY     History reviewed. No pertinent family history. Social History:  reports that he has been smoking cigarettes and cigars. He has never used smokeless tobacco. He reports that he does not currently use alcohol after a past usage of about 2.0 standard drinks of alcohol per week. He reports current drug use. Drug: Marijuana. Allergies: No Known Allergies Medications Prior to Admission  Medication Sig Dispense Refill   albuterol  (VENTOLIN  HFA) 108 (90 Base) MCG/ACT inhaler Inhale 2 puffs into the lungs every 6 (six) hours as needed for wheezing or shortness of breath. 8 g 2   amLODipine  (NORVASC ) 5 MG tablet Take 1 tablet (5 mg total) by mouth daily. 30 tablet 3   prednisoLONE  acetate (PRED FORTE ) 1 % ophthalmic suspension Place 1 drop into both eyes 4 (four) times daily. 5 mL 0      Home: Home Living Family/patient expects to be discharged to:: Private residence Living Arrangements: Alone Available Help at Discharge: Friend(s), Available PRN/intermittently Type of Home: House Home Access: Stairs to enter [employer] of Steps: 1 Home Layout: One level Bathroom Shower/Tub: Engineer, manufacturing systems: Standard Bathroom Accessibility: Yes Home Equipment: None Additional Comments: works 2nd shift. No PCP, friend, Casandra Claw, assist prn  Lives With: Alone   Functional History: Prior Function Prior Level of Function : Independent/Modified Independent, Working/employed, Driving  Functional Status:  Mobility: Bed Mobility Overal bed mobility: Needs Assistance Bed Mobility: Supine to Sit, Sit to Supine Supine to sit: Supervision Sit to supine: Supervision  General bed mobility comments: no physical assistance however a little impulsive. Vcs to slow down and for improved  technique Transfers Overall transfer level: Needs assistance Equipment used: Rolling walker (2 wheels), 1 person hand held assist, None Transfers: Sit to/from Stand Sit to Stand: Contact guard assist, Min assist General transfer comment: CGA to stand from EOB to RW, min assist from lowest bed height to standing without UE support. continued vcs for slowing down Ambulation/Gait Ambulation/Gait assistance: Contact guard assist, Min assist, Mod assist Gait Distance (Feet): 160 Feet Assistive device: Rolling walker (2 wheels), 1 person hand held assist, None Gait Pattern/deviations: Decreased dorsiflexion - left, Decreased stride length General Gait Details: pt ambulated 2 x 160 ft. 1 x 160 with RW + acewrap applied to L hand to prevent hand from sliding off RW. Min assist throughout with ambulation with +1 HHA, mod assist to ambulate without AD. pt remains high fall risk. Throughout ambulation with AD and without, Vcs for L side awareness and improved foot clearance on LLE. Once fatigued, pt tends to slide LLE with poor clearance. Pt does have active dorsiflexion but tends to have flat foot gait during stance phase. Gait velocity: decreased    ADL: ADL Overall ADL's : Needs assistance/impaired Grooming: Oral care, Standing, Minimal assistance Lower Body Dressing: Supervision/safety, Sit to/from stand Lower Body Dressing Details (indicate cue type and reason): Donning/doffing bilateral socks while seated on EOB Toilet Transfer: Ambulation, Contact guard assist Toilet Transfer Details (indicate cue type and reason): Simulated toilet t/f Functional mobility during ADLs: Contact guard assist General ADL Comments: Completes seated ADL tasks with no physical assistance, standing grooming tasks anticipate CGA for safety  Cognition: Cognition Overall Cognitive Status: Within Functional Limits for tasks assessed (family not present to verify baseline) Arousal/Alertness: Awake/alert Orientation  Level: Oriented X4 Attention: Sustained Sustained Attention: Appears intact Awareness: Appears intact Problem Solving: Appears intact Safety/Judgment: Appears intact Cognition Arousal: Alert Behavior During Therapy: WFL for tasks assessed/performed Overall Cognitive Status: Within Functional Limits for tasks assessed (family not present to verify baseline)  Physical Exam: Blood pressure (!) 128/94, pulse 88, temperature 98.4 F (36.9 C), temperature source Oral, resp. rate (!) 21, height 5\' 11"  (1.803 m), weight 117.9 kg, SpO2 96%. Gen: no distress, normal appearing HEENT: oral mucosa pink and moist, NCAT Cardio: Reg rate Chest: normal effort, normal rate of breathing Abd: soft, non-distended Ext: no edema Psych: pleasant, normal affect Skin: intact Physical Exam Neurological:     Comments: Patient is alert.  No acute distress.  Follows commands.  Mild dysarthria but intelligible.  Oriented x 3. 4/5 strength in left leg, left sided facial droop   Results for orders placed or performed during the hospital encounter of 01/09/24 (from the past 48 hours)  CBC     Status: None   Collection Time: 01/13/24  7:55 AM  Result Value Ref Range   WBC 5.7 4.0 - 10.5 K/uL   RBC 4.82 4.22 - 5.81 MIL/uL   Hemoglobin 14.5 13.0 - 17.0 g/dL   HCT 16.1 09.6 - 04.5 %   MCV 91.9 80.0 - 100.0 fL   MCH 30.1 26.0 - 34.0 pg   MCHC 32.7 30.0 - 36.0 g/dL   RDW 40.9 81.1 - 91.4 %   Platelets 281 150 - 400 K/uL   nRBC 0.0 0.0 - 0.2 %    Comment: Performed at [facility], [address] [place], [place] [address]  Basic metabolic panel     Status: Abnormal   Collection  Time: 01/13/24  7:55 AM  Result Value Ref Range   Sodium 138 135 - 145 mmol/L   Potassium 3.5 3.5 - 5.1 mmol/L   Chloride 102 98 - 111 mmol/L   CO2 26 22 - 32 mmol/L   Glucose, Bld 100 (H) 70 - 99 mg/dL    Comment: Glucose reference range applies only to samples taken after fasting for at least 8 hours.   BUN 16 6 - 20  mg/dL   Creatinine, Ser 1.61 0.61 - 1.24 mg/dL   Calcium 9.0 8.9 - 09.6 mg/dL   GFR, Estimated >04 >54 mL/min    Comment: (NOTE) Calculated using the CKD-EPI Creatinine Equation (2021)    Anion gap 10 5 - 15    Comment: Performed at [facility], [address] [place], [place] [address]  CBC     Status: None   Collection Time: 01/14/24  2:39 AM  Result Value Ref Range   WBC 5.9 4.0 - 10.5 K/uL   RBC 4.82 4.22 - 5.81 MIL/uL   Hemoglobin 14.6 13.0 - 17.0 g/dL   HCT 91.4 78.2 - 95.6 %   MCV 92.9 80.0 - 100.0 fL   MCH 30.3 26.0 - 34.0 pg   MCHC 32.6 30.0 - 36.0 g/dL   RDW 21.3 08.6 - 57.8 %   Platelets 280 150 - 400 K/uL   nRBC 0.0 0.0 - 0.2 %    Comment: Performed at [facility], [address] [place], [place] [address]   No results found.    Blood pressure (!) 128/94, pulse 88, temperature 98.4 F (36.9 C), temperature source Oral, resp. rate (!) 21, height 5\' 11"  (1.803 m), weight 117.9 kg, SpO2 96%.  Medical Problem List and Plan: 1. Functional deficits secondary to right PLIC, frontal and pontine infarct with TEE showing small PFO no plan for closure or intervention  -patient may shower  -ELOS/Goals:  S 8-12 days  Admit to CIR  2.  Impaired mobility -DVT/anticoagulation:  Pharmaceutical: Lovenox  -antiplatelet therapy: continue Aspirin 81 mg daily and Plavix 75 mg day x 3 weeks then aspirin alone  3. Pain Management: Tylenol  as needed  4. Mood/Behavior/Sleep: Provide emotional support  -antipsychotic agents: N/A  5. Neuropsych/cognition: This patient is capable of making decisions on his own behalf.  6. Skin/Wound Care: Routine skin checks 7. Fluids/Electrolytes/Nutrition: Routine in and outs with follow-up chemistries  8.  Hypertension.  Continue Norvasc  10 mg daily.  Monitor with increased mobility  9.  Hyperlipidemia. Continue Lipitor  10.  Class II morbid obesity.  BMI 36.26.  Dietary follow-up  11.  Tobacco abuse: continue  NicoDerm patch.  Provide counseling  12.  Bilateral conjunctival inflammation.  Continue eyedrops.  Follow-up outpatient Dr.Brasington  I have personally performed a face to face diagnostic evaluation, including, but not limited to relevant history and physical exam findings, of this patient and developed relevant assessment and plan.  Additionally, I have reviewed and concur with the physician assistant's documentation above.  Atanacio Melnyk, MD   Everlyn Hockey Angiulli, PA-C 01/14/2024

## 2024-01-14 NOTE — Progress Notes (Addendum)
  Inpatient Rehabilitation Admissions Coordinator   I have received insurance approval for CIR admit at [facility] in [place]. CIR bed is available today. Patient currently at TEE. Dr Ariel Begun and acute team made aware. Can plan admit today . I will make the arrangements by contacting [facility] once returned and recovered from TEE. Dr Alessandra Ancona to be admitting MD. [facility] staff to complete [facility] transport paperwork. To admit to [address].  Jeannetta Millman, RN, MSN Rehab Admissions Coordinator [phone] 01/14/2024 12:19 PM

## 2024-01-14 NOTE — H&P (Signed)
 Physical Medicine and Rehabilitation Admission H&P    CC: Right pontine CVA  HPI: Mark Mcintosh is a 53 year old right-handed male with history of bilateral conjunctival inflammation followed by ophthalmology services Dr. Ignatius Makos, hypertension as well as tobacco use and class II morbid obesity with BMI 36.26.  Per chart review patient lives alone.  1 level home one-step to enter.  Independent prior to admission.  Presented to [facility] 01/09/2024 with acute onset of left-sided weakness.  MRI showed acute infarcts in the posterior limb of the right internal capsule, the overlying right frontal white matter, and pons.  Multiple remote infarcts and chronic microvascular ischemic disease.  CTA showed no large vessel occlusion.  Admission chemistries unremarkable except potassium 3.1, hemoglobin A1c 5.7.  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  TEE with small PFO per cardiology services Dr Bob Burn and no plan for closure.  Neurology follow-up placed on aspirin and Plavix for CVA prophylaxis x 3 weeks then aspirin alone.  Lovenox for DVT prophylaxis and bilateral lower extremity Dopplers negative.  Therapy evaluations completed due to patient decreased functional ability left-sided weakness was admitted for a comprehensive rehab program. Currently ambulating MinA 320 feet.  Review of Systems  Constitutional:  Negative for chills and fever.  HENT:  Negative for hearing loss.   Eyes:  Negative for blurred vision and double vision.  Respiratory:  Negative for cough, shortness of breath and wheezing.   Cardiovascular:  Negative for chest pain, palpitations and leg swelling.  Gastrointestinal:  Positive for constipation. Negative for heartburn, nausea and vomiting.  Genitourinary:  Negative for dysuria, flank pain and hematuria.  Musculoskeletal:  Positive for myalgias.  Skin:  Negative for rash.  Neurological:  Positive for focal weakness and weakness.  All other systems reviewed and are  negative.  Past Medical History:  Diagnosis Date   Arthritis    Hypertension    Past Surgical History:  Procedure Laterality Date   ABDOMINAL SURGERY     History reviewed. No pertinent family history. Social History:  reports that he has been smoking cigarettes and cigars. He has never used smokeless tobacco. He reports that he does not currently use alcohol after a past usage of about 2.0 standard drinks of alcohol per week. He reports current drug use. Drug: Marijuana. Allergies: No Known Allergies Medications Prior to Admission  Medication Sig Dispense Refill   albuterol  (VENTOLIN  HFA) 108 (90 Base) MCG/ACT inhaler Inhale 2 puffs into the lungs every 6 (six) hours as needed for wheezing or shortness of breath. 8 g 2   amLODipine  (NORVASC ) 10 MG tablet Take 1 tablet (10 mg total) by mouth daily.     aspirin EC 81 MG tablet Take 1 tablet (81 mg total) by mouth daily. Swallow whole.     atorvastatin (LIPITOR) 40 MG tablet Take 1 tablet (40 mg total) by mouth daily.     clopidogrel (PLAVIX) 75 MG tablet Take 1 tablet (75 mg total) by mouth daily for 21 days.     [START ON 01/15/2024] nicotine (NICODERM CQ - DOSED IN MG/24 HOURS) 21 mg/24hr patch Place 1 patch (21 mg total) onto the skin daily.     prednisoLONE  acetate (PRED FORTE ) 1 % ophthalmic suspension Place 1 drop into both eyes 4 (four) times daily. 5 mL 0   Home: Home Living Family/patient expects to be discharged to:: Private residence Living Arrangements: Alone Available Help at Discharge: Friend(s), Available PRN/intermittently Type of Home: House Home Access: Stairs to enter [employer] of  Steps: 1 Home Layout: One level Bathroom Shower/Tub: Engineer, manufacturing systems: Standard Bathroom Accessibility: Yes Home Equipment: None Additional Comments: works 2nd shift. No PCP, friend, Casandra Claw, assist prn  Lives With: Alone   Functional History: Prior Function Prior Level of Function : Independent/Modified  Independent, Working/employed, Driving   Functional Status:  Mobility: Bed Mobility Overal bed mobility: Needs Assistance Bed Mobility: Supine to Sit, Sit to Supine Supine to sit: Supervision Sit to supine: Supervision General bed mobility comments: no physical assistance however a little impulsive. Vcs to slow down and for improved technique Transfers Overall transfer level: Needs assistance Equipment used: Rolling walker (2 wheels), 1 person hand held assist, None Transfers: Sit to/from Stand Sit to Stand: Contact guard assist, Min assist General transfer comment: CGA to stand from EOB to RW, min assist from lowest bed height to standing without UE support. continued vcs for slowing down Ambulation/Gait Ambulation/Gait assistance: Contact guard assist, Min assist, Mod assist Gait Distance (Feet): 160 Feet Assistive device: Rolling walker (2 wheels), 1 person hand held assist, None Gait Pattern/deviations: Decreased dorsiflexion - left, Decreased stride length General Gait Details: pt ambulated 2 x 160 ft. 1 x 160 with RW + acewrap applied to L hand to prevent hand from sliding off RW. Min assist throughout with ambulation with +1 HHA, mod assist to ambulate without AD. pt remains high fall risk. Throughout ambulation with AD and without, Vcs for L side awareness and improved foot clearance on LLE. Once fatigued, pt tends to slide LLE with poor clearance. Pt does have active dorsiflexion but tends to have flat foot gait during stance phase. Gait velocity: decreased   ADL: ADL Overall ADL's : Needs assistance/impaired Grooming: Oral care, Standing, Minimal assistance Lower Body Dressing: Supervision/safety, Sit to/from stand Lower Body Dressing Details (indicate cue type and reason): Donning/doffing bilateral socks while seated on EOB Toilet Transfer: Ambulation, Contact guard assist Toilet Transfer Details (indicate cue type and reason): Simulated toilet t/f Functional mobility  during ADLs: Contact guard assist General ADL Comments: Completes seated ADL tasks with no physical assistance, standing grooming tasks anticipate CGA for safety   Cognition: Cognition Overall Cognitive Status: Within Functional Limits for tasks assessed (family not present to verify baseline) Arousal/Alertness: Awake/alert Orientation Level: Oriented X4 Attention: Sustained Sustained Attention: Appears intact Awareness: Appears intact Problem Solving: Appears intact Safety/Judgment: Appears intact Cognition Arousal: Alert Behavior During Therapy: WFL for tasks assessed/performed Overall Cognitive Status: Within Functional Limits for tasks assessed (family not present to verify baseline)   Physical Exam: Blood pressure (!) 128/94, pulse 88, temperature 98.4 F (36.9 C), temperature source Oral, resp. rate (!) 21, height 5\' 11"  (1.803 m), weight 117.9 kg, SpO2 96%. Gen: no distress, normal appearing HEENT: oral mucosa pink and moist, NCAT Cardio: Reg rate Chest: normal effort, normal rate of breathing Abd: soft, non-distended Ext: no edema Psych: pleasant, normal affect Skin: intact Physical Exam Neurological:     Comments: Patient is alert.  No acute distress.  Follows commands.  Mild dysarthria but intelligible.  Oriented x 3. 4/5 strength in left leg, left sided facial droop     Physical Exam: Blood pressure (!) 139/109, pulse 94, temperature 98.2 F (36.8 C), temperature source Oral, resp. rate 18, height 5\' 11"  (1.803 m), weight 98 kg, SpO2 98%. Gen: no distress, normal appearing HEENT: oral mucosa pink and moist, NCAT Cardio: Reg rate Chest: normal effort, normal rate of breathing Abd: soft, non-distended Ext: no edema Psych: pleasant, normal affect Skin: intact Physical Exam Neurological:  Comments: Patient is alert.  No acute distress.  Follows commands.  Mild dysarthria but intelligible.  Oriented x 3. 4/5 strength in left leg, left sided facial  droop   Results for orders placed or performed during the hospital encounter of 01/09/24 (from the past 48 hours)  CBC     Status: None   Collection Time: 01/13/24  7:55 AM  Result Value Ref Range   WBC 5.7 4.0 - 10.5 K/uL   RBC 4.82 4.22 - 5.81 MIL/uL   Hemoglobin 14.5 13.0 - 17.0 g/dL   HCT 16.1 09.6 - 04.5 %   MCV 91.9 80.0 - 100.0 fL   MCH 30.1 26.0 - 34.0 pg   MCHC 32.7 30.0 - 36.0 g/dL   RDW 40.9 81.1 - 91.4 %   Platelets 281 150 - 400 K/uL   nRBC 0.0 0.0 - 0.2 %    Comment: Performed at [facility], [address], [place], [place] [address]  Basic metabolic panel     Status: Abnormal   Collection Time: 01/13/24  7:55 AM  Result Value Ref Range   Sodium 138 135 - 145 mmol/L   Potassium 3.5 3.5 - 5.1 mmol/L   Chloride 102 98 - 111 mmol/L   CO2 26 22 - 32 mmol/L   Glucose, Bld 100 (H) 70 - 99 mg/dL    Comment: Glucose reference range applies only to samples taken after fasting for at least 8 hours.   BUN 16 6 - 20 mg/dL   Creatinine, Ser 6.21 0.61 - 1.24 mg/dL   Calcium  9.0 8.9 - 10.3 mg/dL   GFR, Estimated >30 >86 mL/min    Comment: (NOTE) Calculated using the CKD-EPI Creatinine Equation (2021)    Anion gap 10 5 - 15    Comment: Performed at [facility], [address] [place], [place] [address]  CBC     Status: None   Collection Time: 01/14/24  2:39 AM  Result Value Ref Range   WBC 5.9 4.0 - 10.5 K/uL   RBC 4.82 4.22 - 5.81 MIL/uL   Hemoglobin 14.6 13.0 - 17.0 g/dL   HCT 96.2 95.2 - 84.1 %   MCV 92.9 80.0 - 100.0 fL   MCH 30.3 26.0 - 34.0 pg   MCHC 32.6 30.0 - 36.0 g/dL   RDW 32.4 40.1 - 02.7 %   Platelets 280 150 - 400 K/uL   nRBC 0.0 0.0 - 0.2 %    Comment: Performed at [facility], [address] [place], [place] [address]   ECHO TEE Result Date: 01/14/2024    TRANSESOPHOGEAL ECHO REPORT   Patient Name:   Mark Mcintosh Date of Exam: 01/14/2024 Medical Rec #:  [id number]         Height:       71.0 in Accession #:     [id number]        Weight:       260.0 lb Date of Birth:  1970/12/27         BSA:          2.357 m Patient Age:    53 years          BP:           128/94 mmHg Patient Gender: M                 HR:           88 bpm. Exam Location:  [facility] Procedure: Transesophageal Echo, Cardiac  Doppler, Color Doppler and Saline            Contrast Bubble Study (Both Spectral and Color Flow Doppler were            utilized during procedure). Indications:     Stroke  History:         Patient has prior history of Echocardiogram examinations, most                  recent 01/10/2024. Risk Factors:Hypertension.  Sonographer:     Broadus Canes Referring Phys:  [id number] DWAYNE D CALLWOOD Diagnosing Phys: Joetta Mustache PROCEDURE: After discussion of the risks and benefits of a TEE, an informed consent was obtained from the patient. The transesophogeal probe was passed without difficulty through the esophogus of the patient. Local oropharyngeal anesthetic was provided with Cetacaine. Sedation performed by different physician. The patient was monitored while under deep sedation. Image quality was excellent. The patient developed no complications during the procedure.  IMPRESSIONS  1. 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  2. Left ventricular ejection fraction, by estimation, is 55 to 60%. The left ventricle has normal function.  3. Right ventricular systolic function is normal. The right ventricular size is normal.  4. No left atrial/left atrial appendage thrombus was detected.  5. The mitral valve is normal in structure. Trivial mitral valve regurgitation.  6. The aortic valve is tricuspid. Aortic valve regurgitation is not visualized.  7. Agitated saline contrast bubble study was positive with shunting observed within 3-6 cardiac cycles suggestive of interatrial shunt. There is a small patent foramen ovale. FINDINGS  Left Ventricle: Left ventricular ejection fraction, by estimation,  is 55 to 60%. The left ventricle has normal function. The left ventricular internal cavity size was normal in size. Right Ventricle: The right ventricular size is normal. No increase in right ventricular wall thickness. Right ventricular systolic function is normal. Left Atrium: Left atrial size was normal in size. No left atrial/left atrial appendage thrombus was detected. Right Atrium: Right atrial size was normal in size. Pericardium: There is no evidence of pericardial effusion. Mitral Valve: The mitral valve is normal in structure. Trivial mitral valve regurgitation. Tricuspid Valve: The tricuspid valve is normal in structure. Tricuspid valve regurgitation is mild. Aortic Valve: The aortic valve is tricuspid. Aortic valve regurgitation is not visualized. Pulmonic Valve: The pulmonic valve was normal in structure. Pulmonic valve regurgitation is not visualized. Aorta: The aortic root is normal in size and structure. IAS/Shunts: The interatrial septum is aneurysmal. Agitated saline contrast was given intravenously to evaluate for intracardiac shunting. Agitated saline contrast bubble study was positive with shunting observed within 3-6 cardiac cycles suggestive of interatrial shunt. A small patent foramen ovale is detected. Lida Reeks Alluri Electronically signed by Joetta Mustache Signature Date/Time: 01/14/2024/1:35:26 PM    Final       Blood pressure (!) 139/109, pulse 94, temperature 98.2 F (36.8 C), temperature source Oral, resp. rate 18, height 5\' 11"  (1.803 m), weight 98 kg, SpO2 98%.  Medical Problem List and Plan: 1. Functional deficits secondary to right PLIC, frontal and pontine infarct with TEE showing small PFO no plan for closure or intervention  -patient may shower  -ELOS/Goals:  S 8-12 days  Admit to CIR  2.  Impaired mobility -DVT/anticoagulation:  Pharmaceutical: Lovenox  -antiplatelet therapy: continue Aspirin 81 mg daily and Plavix 75 mg day x 3 weeks then aspirin alone  3. Pain  Management: Tylenol  as needed  4. Mood/Behavior/Sleep: Provide emotional support  -antipsychotic agents: N/A  5. Neuropsych/cognition: This patient is capable of making decisions on his own behalf.  6. Skin/Wound Care: Routine skin checks 7. Fluids/Electrolytes/Nutrition: Routine in and outs with follow-up chemistries  8.  Hypertension.  Continue Norvasc  10 mg daily.  Monitor with increased mobility. Check magnesium level tomorrow. Add avapro 75mg  daily.   9.  Hyperlipidemia: continue Lipitor  10.  Class II morbid obesity.  BMI 36.26.  Dietary follow-up  11.  Tobacco abuse: continue NicoDerm patch.  Provide counseling  12.  Bilateral conjunctival inflammation.  Continue eyedrops.  Follow-up outpatient Dr.Brasington  13. Screening for vitamin D deficiency: check vitamin D level tomorrow  I have personally performed a face to face diagnostic evaluation, including, but not limited to relevant history and physical exam findings, of this patient and developed relevant assessment and plan.  Additionally, I have reviewed and concur with the physician assistant's documentation above.  Ameirah Khatoon, MD  Everlyn Hockey Angiulli, PA-C

## 2024-01-14 NOTE — Progress Notes (Signed)
 Physical Therapy Treatment Patient Details Name: Mark Mcintosh MRN: [id number] DOB: [birth year] Today's Date: 01/14/2024   History of Present Illness presented to ER secondary to acute onset of L-sided weakness; admitted for TIA/CVA work-up.  MRI significant for acute infarcts to posterior limb of internal capsule, R frontal white matter and pons.    PT Comments  Pt was supine in bed upon arrival. He remains A and O x 4 however still presents with poor L side awareness/inattention. Extremely motivated to improve so he can eventually return to work. Author questions pt's overall insight of current situation and deficits.  Pt's was unaware L arm was hanging off the bed and then later in session unaware he was sitting on hand.  Session focused on improving dynamic standing balance. He ambulated with RW + ace wrap on LUE (no walker splint available) to assist with gripping RW with CGA. Vcs throughout to improve posture, heel strike and LLE foot clearance. He requires much more extensive assistance to ambulate without AD and with +1 UE support. Pt remains far from his baseline. He will continue to benefit from continued skilled PT at DC to maximize independence and safety with all ADLs. Dc recs remain appropriate.    If plan is discharge home, recommend the following: A little help with walking and/or transfers;A lot of help with bathing/dressing/bathroom;Assistance with cooking/housework;Assist for transportation;Help with stairs or ramp for entrance     Equipment Recommendations  Rolling walker (2 wheels);BSC/3in1 (LUE walker splint)       Precautions / Restrictions Precautions Precautions: Fall Recall of Precautions/Restrictions: Impaired Restrictions Weight Bearing Restrictions Per Provider Order: No     Mobility  Bed Mobility Overal bed mobility: Needs Assistance Bed Mobility: Supine to Sit, Sit to Supine  Supine to sit: Supervision Sit to supine: Supervision General bed mobility  comments: no physical assistance however a little impulsive. Vcs to slow down and for improved technique    Transfers Overall transfer level: Needs assistance Equipment used: Rolling walker (2 wheels), 1 person hand held assist, None Transfers: Sit to/from Stand Sit to Stand: Contact guard assist, Min assist  General transfer comment: CGA to stand from EOB to RW, min assist from lowest bed height to standing without UE support. continued vcs for slowing down    Ambulation/Gait Ambulation/Gait assistance: Contact guard assist, Min assist, Mod assist Gait Distance (Feet): 160 Feet Assistive device: Rolling walker (2 wheels), 1 person hand held assist, None Gait Pattern/deviations: Decreased dorsiflexion - left, Decreased stride length Gait velocity: decreased  General Gait Details: pt ambulated 2 x 160 ft. 1 x 160 with RW + acewrap applied to L hand to prevent hand from sliding off RW. Min assist throughout with ambulation with +1 HHA, mod assist to ambulate without AD. pt remains high fall risk. Throughout ambulation with AD and without, Vcs for L side awareness and improved foot clearance on LLE. Once fatigued, pt tends to slide LLE with poor clearance. Pt does have active dorsiflexion but tends to have flat foot gait during stance phase.    Balance Overall balance assessment: Needs assistance Sitting-balance support: No upper extremity supported, Feet supported Sitting balance-Leahy Scale: Good     Standing balance support: No upper extremity supported, During functional activity, Reliant on assistive device for balance Standing balance-Leahy Scale: Poor Standing balance comment: Pt is at high risk of falls without UE support during dynamic standing activity       Communication Communication Communication: Impaired  Cognition Arousal: Alert Behavior During Therapy: [employer] for  tasks assessed/performed   PT - Cognitive impairments: No apparent impairments    PT - Cognition Comments:  Pt remains A and O x 4 however still presents with L neglect and poor overall awareness of deficits. Currently NPO for TEE Following commands: Intact      Cueing Cueing Techniques: Verbal cues, Tactile cues     General Comments General comments (skin integrity, edema, etc.): encouraged pt to be more attentive to LUE/LLE. encouraged grip strengthening and LLE dorsiflexion exercises      Pertinent Vitals/Pain Pain Assessment Pain Assessment: 0-10 Pain Score: 7  Pain Location: LBP Pain Descriptors / Indicators: Discomfort Pain Intervention(s): Limited activity within patient's tolerance, Monitored during session, Repositioned, Patient requesting pain meds-RN notified     PT Goals (current goals can now be found in the care plan section) Acute Rehab PT Goals Patient Stated Goal: get better so I can return to working Progress towards PT goals: Progressing toward goals    Frequency    Min 3X/week       AM-PAC PT "6 Clicks" Mobility   Outcome Measure  Help needed turning from your back to your side while in a flat bed without using bedrails?: None Help needed moving from lying on your back to sitting on the side of a flat bed without using bedrails?: A Little Help needed moving to and from a bed to a chair (including a wheelchair)?: A Little Help needed standing up from a chair using your arms (e.g., wheelchair or bedside chair)?: A Little Help needed to walk in hospital room?: A Lot Help needed climbing 3-5 steps with a railing? : A Lot 6 Click Score: 17    End of Session Equipment Utilized During Treatment: Gait belt Activity Tolerance: Patient tolerated treatment well Patient left: in bed;with call bell/phone within reach;with family/visitor present Nurse Communication: Mobility status PT Visit Diagnosis: Muscle weakness (generalized) (M62.81);Difficulty in walking, not elsewhere classified (R26.2)     Time: 1610-9604 PT Time Calculation (min) (ACUTE ONLY): 18  min  Charges:    $Neuromuscular Re-education: 8-22 mins PT General Charges $$ ACUTE PT VISIT: 1 Visit                     Chester Costa PTA 01/14/24, 11:13 AM

## 2024-01-14 NOTE — Anesthesia Preprocedure Evaluation (Addendum)
 Anesthesia Evaluation  Patient identified by MRN, date of birth, ID band Patient awake    Reviewed: Allergy & Precautions, NPO status , Patient's Chart, lab work & pertinent test results  History of Anesthesia Complications Negative for: history of anesthetic complications  Airway Mallampati: III  TM Distance: >3 FB Neck ROM: full    Dental  (+) Chipped, Poor Dentition, Missing   Pulmonary sleep apnea , Current Smoker   Pulmonary exam normal        Cardiovascular hypertension, Normal cardiovascular exam     Neuro/Psych CVA, Residual Symptoms  negative psych ROS   GI/Hepatic negative GI ROS, Neg liver ROS,neg GERD  ,,  Endo/Other  negative endocrine ROS    Renal/GU negative Renal ROS  negative genitourinary   Musculoskeletal   Abdominal   Peds  Hematology negative hematology ROS (+)   Anesthesia Other Findings Cleared by Neurology to proceed   Past Medical History: No date: Arthritis No date: Hypertension  Past Surgical History: No date: ABDOMINAL SURGERY  BMI    Body Mass Index: 36.26 kg/m      Reproductive/Obstetrics negative OB ROS                              Anesthesia Physical Anesthesia Plan  ASA: 4  Anesthesia Plan: General   Post-op Pain Management:    Induction: Intravenous  PONV Risk Score and Plan: Propofol infusion and TIVA  Airway Management Planned: Natural Airway and Nasal Cannula  Additional Equipment:   Intra-op Plan:   Post-operative Plan:   Informed Consent: I have reviewed the patients History and Physical, chart, labs and discussed the procedure including the risks, benefits and alternatives for the proposed anesthesia with the patient or authorized representative who has indicated his/her understanding and acceptance.     Dental Advisory Given  Plan Discussed with: Anesthesiologist, CRNA and Surgeon  Anesthesia Plan Comments: (Patient  consented for risks of anesthesia including but not limited to:  - adverse reactions to medications - risk of airway placement if required - damage to eyes, teeth, lips or other oral mucosa - nerve damage due to positioning  - sore throat or hoarseness - Damage to heart, brain, nerves, lungs, other parts of body or loss of life  Patient voiced understanding and assent.)        Anesthesia Quick Evaluation

## 2024-01-14 NOTE — Discharge Summary (Signed)
 Physician Discharge Summary   Patient: Mark Mcintosh MRN: [id number] DOB: Nov 14, 1970  Admit date:     01/09/2024  Discharge date: 01/14/24  Discharge Physician: Luna Salinas   PCP: Pcp, No   Recommendations at discharge:  Patient is being discharged to [facility] with aspirin and Plavix for 3 weeks followed by aspirin only Follow-up with primary care provider Follow-up with neurology  Discharge Diagnoses: Principal Problem:   Acute CVA (cerebrovascular accident) [facility]) Active Problems:   Tobacco use disorder   Hypertension   Obesity (BMI 30-39.9)   Hospital Course: Mark Mcintosh is a 53 y.o. male with medical history significant for hypertension, tobacco use disorder, who presented to the hospital because of left-sided weakness.   He was found to have acute infarct involving  the posterior limb of the right internal capsule, overlying right frontal white matter and pons.  Embolic etiology is suspected given multiple vascular territories.  There is also evidence of multiple remote infarcts and chronic microvascular ischemic disease. 2D echo showed EF estimated at 55 to 60%, indeterminate LV diastolic parameters, mildly elevated pulmonary artery systolic pressure, positive bubble study suggestive of extracardiac shunt/intrapulmonary shunt CTA head and neck did not show any large vessel occlusion. Venous duplex of bilateral lower extremity did not show any DVT. Lipid panel total cholesterol 185, triglyceride 117, HDL 42, LDL 120. Neurologist recommended dual antiplatelet therapy with aspirin and Plavix for 21 days total followed by aspirin monotherapy. Patient also underwent TEE which shows a small PFO which does not require any intervention at this time.  Patient was started on statin.  A1c of 5.7, that makes him prediabetic.  Patient was counseled for weight loss and healthy eating habits and need to have a close follow-up with primary care provider for further  assistance.  His home amlodipine  dose was increased to 10 mg daily for better control of his blood pressure.  Primary care provider need to titrate or add another agent as appropriate.  Our physical therapist recommending inpatient rehab where he is being discharged for further management and rehab needs.  Patient is a current smoker, counseling was provided and need to be continued.  Nicotine patch was provided to help.  Patient was also found to have bilateral conjunctival inflammation and was evaluated by ophthalmologist in ED and was given prednisone  eyedrops which she will continue and follow-up with outpatient ophthalmology for further management.  Patient will continue on current medications and need to have a close follow-up with his providers for further care.  Consultants: Neurology.  Cardiology Procedures performed: TEE Disposition: Rehabilitation facility Diet recommendation:  Discharge Diet Orders (From admission, onward)     Start     Ordered   01/14/24 0000  Diet - low sodium heart healthy        01/14/24 1315           Cardiac and Carb modified diet DISCHARGE MEDICATION: Allergies as of 01/14/2024   No Known Allergies      Medication List     TAKE these medications    albuterol  108 (90 Base) MCG/ACT inhaler Commonly known as: VENTOLIN  HFA Inhale 2 puffs into the lungs every 6 (six) hours as needed for wheezing or shortness of breath.   amLODipine  10 MG tablet Commonly known as: NORVASC  Take 1 tablet (10 mg total) by mouth daily. What changed:  medication strength how much to take   aspirin EC 81 MG tablet Take 1 tablet (81 mg total) by mouth daily. Swallow whole.  atorvastatin 40 MG tablet Commonly known as: LIPITOR Take 1 tablet (40 mg total) by mouth daily.   clopidogrel 75 MG tablet Commonly known as: PLAVIX Take 1 tablet (75 mg total) by mouth daily for 21 days.   nicotine 21 mg/24hr patch Commonly known as: NICODERM CQ - dosed in  mg/24 hours Place 1 patch (21 mg total) onto the skin daily. Start taking on: Jan 15, 2024   prednisoLONE  acetate 1 % ophthalmic suspension Commonly known as: PRED FORTE  Place 1 drop into both eyes 4 (four) times daily.        Follow-up Information     Alluri, Odessa Bene, MD. Go in 1 week(s).   Specialty: Cardiology Contact information: [address] [place] [address] [phone]         Rosan Comfort, MD Follow up in 1 month(s).   Specialty: Neurology Contact information: [phone] [facility] [address] [facility] [place] [address] [phone]                Discharge Exam: Mark Mcintosh Weights   01/09/24 1730  Weight: 117.9 kg   General.  Obese gentleman, in no acute distress. Pulmonary.  Lungs clear bilaterally, normal respiratory effort. CV.  Regular rate and rhythm, no JVD, rub or murmur. Abdomen.  Soft, nontender, nondistended, BS positive. CNS.  Alert and oriented .  Mild left-sided weakness Extremities.  No edema,  pulses intact and symmetrical. Psychiatry.  Judgment and insight appears normal.   Condition at discharge: stable  The results of significant diagnostics from this hospitalization (including imaging, microbiology, ancillary and laboratory) are listed below for reference.   Imaging Studies: US  Venous Img Lower Bilateral (DVT) Result Date: 01/11/2024 CLINICAL DATA:  Stroke EXAM: BILATERAL LOWER EXTREMITY VENOUS DOPPLER ULTRASOUND TECHNIQUE: Gray-scale sonography with compression, as well as color and duplex ultrasound, were performed to evaluate the deep venous system(s) from the level of the common femoral vein through the popliteal and proximal calf veins. COMPARISON:  None Available. FINDINGS: VENOUS Normal compressibility of the common femoral, superficial femoral, and popliteal veins, as well as the visualized calf veins. Visualized portions of profunda femoral vein and great saphenous vein unremarkable. No filling  defects to suggest DVT on grayscale or color Doppler imaging. Doppler waveforms show normal direction of venous flow, normal respiratory plasticity and response to augmentation. OTHER None. Limitations: none IMPRESSION: Negative. Electronically Signed   By: Reagan Camera M.D.   On: 01/11/2024 11:09   ECHOCARDIOGRAM COMPLETE BUBBLE STUDY Result Date: 01/10/2024    ECHOCARDIOGRAM REPORT   Patient Name:   LEYTON MAGOON Date of Exam: 01/10/2024 Medical Rec #:  [id number]         Height:       71.0 in Accession #:    [id number]        Weight:       260.0 lb Date of Birth:  Mar 14, 1971         BSA:          2.357 m Patient Age:    53 years          BP:           127/95 mmHg Patient Gender: M                 HR:           72 bpm. Exam Location:  [facility] Procedure: 2D Echo, Cardiac Doppler, Color Doppler and Saline Contrast Bubble  Study (Both Spectral and Color Flow Doppler were utilized during            procedure). Indications:     Stroke 434.91 / I63.9  History:         Patient has no prior history of Echocardiogram examinations.                  Risk Factors:Hypertension.  Sonographer:     Kathaleen Pale Roar Referring Phys:  [id number] Lanetta Pion Diagnosing Phys: Sheryle Donning MD IMPRESSIONS  1. Left ventricular ejection fraction, by estimation, is 55 to 60%. The left ventricle has normal function. The left ventricle has no regional wall motion abnormalities. Left ventricular diastolic parameters are indeterminate.  2. Right ventricular systolic function is normal. The right ventricular size is normal. There is mildly elevated pulmonary artery systolic pressure.  3. The mitral valve is normal in structure. Trivial mitral valve regurgitation. No evidence of mitral stenosis.  4. The aortic valve is normal in structure. Aortic valve regurgitation is trivial. No aortic stenosis is present.  5. The inferior vena cava is normal in size with greater than 50% respiratory variability, suggesting right atrial  pressure of 3 mmHg.  6. Agitated saline contrast bubble study was positive with shunting observed after >6 cardiac cycles suggestive of intrapulmonary shunting. Comparison(s): No prior Echocardiogram. Conclusion(s)/Recommendation(s): Late positive bubble study, suggestive of extracardiac shunt. Septum appears aneurysmal, but there is no clear left to right shunting on available images. Consider TEE with bubble study if high suspicion for embolic source of CVA. FINDINGS  Left Ventricle: Left ventricular ejection fraction, by estimation, is 55 to 60%. The left ventricle has normal function. The left ventricle has no regional wall motion abnormalities. The left ventricular internal cavity size was normal in size. There is  borderline left ventricular hypertrophy. Left ventricular diastolic parameters are indeterminate. Right Ventricle: The right ventricular size is normal. No increase in right ventricular wall thickness. Right ventricular systolic function is normal. There is mildly elevated pulmonary artery systolic pressure. The tricuspid regurgitant velocity is 2.95  m/s, and with an assumed right atrial pressure of 3 mmHg, the estimated right ventricular systolic pressure is 37.8 mmHg. Left Atrium: Left atrial size was normal in size. Right Atrium: Right atrial size was normal in size. Pericardium: There is no evidence of pericardial effusion. Mitral Valve: The mitral valve is normal in structure. Trivial mitral valve regurgitation. No evidence of mitral valve stenosis. MV peak gradient, 5.5 mmHg. The mean mitral valve gradient is 1.0 mmHg. Tricuspid Valve: The tricuspid valve is normal in structure. Tricuspid valve regurgitation is trivial. No evidence of tricuspid stenosis. Aortic Valve: The aortic valve is normal in structure. Aortic valve regurgitation is trivial. No aortic stenosis is present. Aortic valve mean gradient measures 3.0 mmHg. Aortic valve peak gradient measures 6.0 mmHg. Aortic valve area, by VTI  measures 3.83 cm. Pulmonic Valve: The pulmonic valve was grossly normal. Pulmonic valve regurgitation is trivial. No evidence of pulmonic stenosis. Aorta: The aortic root and ascending aorta are structurally normal, with no evidence of dilitation. Ascending aorta measurements are within normal limits for age when indexed to body surface area. Venous: The inferior vena cava is normal in size with greater than 50% respiratory variability, suggesting right atrial pressure of 3 mmHg. IAS/Shunts: The interatrial septum is aneurysmal. The atrial septum is grossly normal. Agitated saline contrast was given intravenously to evaluate for intracardiac shunting. Agitated saline contrast bubble study was positive with shunting observed after  >6 cardiac  cycles suggestive of intrapulmonary shunting.  LEFT VENTRICLE PLAX 2D LVIDd:         4.30 cm     Diastology LVIDs:         3.00 cm     LV e' medial:    8.16 cm/s LV PW:         1.20 cm     LV E/e' medial:  10.9 LV IVS:        1.20 cm     LV e' lateral:   8.70 cm/s LVOT diam:     2.10 cm     LV E/e' lateral: 10.2 LV SV:         69 LV SV Index:   29 LVOT Area:     3.46 cm  LV Volumes (MOD) LV vol d, MOD A2C: 81.8 ml LV vol d, MOD A4C: 80.1 ml LV vol s, MOD A2C: 34.4 ml LV vol s, MOD A4C: 35.7 ml LV SV MOD A2C:     47.4 ml LV SV MOD A4C:     80.1 ml LV SV MOD BP:      45.4 ml RIGHT VENTRICLE RV Basal diam:  3.60 cm RV Mid diam:    3.30 cm RV S prime:     11.90 cm/s TAPSE (M-mode): 2.6 cm LEFT ATRIUM             Index        RIGHT ATRIUM           Index LA diam:        3.90 cm 1.65 cm/m   RA Area:     18.50 cm LA Vol (A2C):   49.3 ml 20.91 ml/m  RA Volume:   49.20 ml  20.87 ml/m LA Vol (A4C):   56.7 ml 24.05 ml/m LA Biplane Vol: 53.2 ml 22.57 ml/m  AORTIC VALVE                    PULMONIC VALVE AV Area (Vmax):    3.35 cm     PV Vmax:          1.07 m/s AV Area (Vmean):   3.26 cm     PV Peak grad:     4.6 mmHg AV Area (VTI):     3.83 cm     PR End Diast Vel: 7.29 msec AV  Vmax:           122.00 cm/s  RVOT Peak grad:   2 mmHg AV Vmean:          74.800 cm/s AV VTI:            0.181 m AV Peak Grad:      6.0 mmHg AV Mean Grad:      3.0 mmHg LVOT Vmax:         118.00 cm/s LVOT Vmean:        70.300 cm/s LVOT VTI:          0.200 m LVOT/AV VTI ratio: 1.10  AORTA Ao Root diam: 3.20 cm Ao Asc diam:  3.80 cm MITRAL VALVE                TRICUSPID VALVE MV Area (PHT): 4.49 cm     TR Peak grad:   34.8 mmHg MV Area VTI:   2.14 cm     TR Vmax:        295.00 cm/s MV Peak grad:  5.5 mmHg MV Mean grad:  1.0 mmHg  SHUNTS MV Vmax:       1.17 m/s     Systemic VTI:  0.20 m MV Vmean:      54.7 cm/s    Systemic Diam: 2.10 cm MV Decel Time: 169 msec MV E velocity: 88.60 cm/s MV A velocity: 112.00 cm/s MV E/A ratio:  0.79 MV A Prime:    12.6 cm/s Sheryle Donning MD Electronically signed by Sheryle Donning MD Signature Date/Time: 01/10/2024/6:49:32 PM    Final    CT ANGIO HEAD NECK W WO CM Result Date: 01/10/2024 EXAM: CT HEAD WITHOUT CTA HEAD AND NECK WITH AND WITHOUT 01/10/2024 01:39:58 PM TECHNIQUE: CTA of the head and neck was performed with and without the administration of intravenous contrast. Noncontrast CT of the head with reconstructed 2-D images are also provided for review. Multiplanar 2D and/or 3D reformatted images are provided for review. Automated exposure control, iterative reconstruction, and/or weight based adjustment of the mA/kV was utilized to reduce the radiation dose to as low as reasonably achievable. COMPARISON: 01/09/2024 CLINICAL HISTORY: Stroke, follow up. FINDINGS: CT HEAD: BRAIN AND VENTRICLES: A remote lacunar infarct is again noted in the genu of the left internal capsule and globus pallidus. Remote lacunar infarcts are present in the right corona radiata. Periventricular white matter disease is moderately advanced for age. The acute infarct involving the posterior limb of the right internal capsule is less well appreciated. No acute intracranial hemorrhage.  No mass effect or midline shift. No extra-axial fluid collection. Gray-white differentiation is maintained. No hydrocephalus. ORBITS: No acute abnormality. SINUSES: No acute abnormality. SOFT TISSUES AND SKULL: No acute abnormality. CTA NECK: AORTIC ARCH AND ARCH VESSELS: No dissection or arterial injury. No significant stenosis of the brachiocephalic or subclavian arteries. CERVICAL CAROTID ARTERIES: No dissection, arterial injury, or hemodynamically significant stenosis by NASCET criteria. CERVICAL VERTEBRAL ARTERIES: The left vertebral artery is dominant. No dissection, arterial injury, or significant stenosis. VISUALIZED LUNGS AND MEDIASTINUM: Centrilobular emphysematous changes are present. SOFT TISSUES: No acute abnormality. BONES: Mild degenerative changes are present in the lower cervical spine. Extensive dental disease is present. No focal osseous lesions are present. CTA HEAD: ANTERIOR CIRCULATION: No significant stenosis of the internal carotid arteries. No significant stenosis of the anterior cerebral arteries. No significant stenosis of the middle cerebral arteries. No aneurysm. POSTERIOR CIRCULATION: No significant stenosis of the posterior cerebral arteries. No significant stenosis of the basilar artery. No significant stenosis of the vertebral arteries. No aneurysm. OTHER: No dural venous sinus thrombosis on this non-dedicated study. IMPRESSION: 1. No acute intracranial hemorrhage or mass effect. 2. No large vessel occlusion, hemodynamically significant stenosis, or aneurysm in the head or neck. 3. Remote lacunar infarcts in the genu of the left internal capsule, globus pallidus, and right corona radiata. 4. Acute infarct involving the posterior limb of the right internal capsule is less well appreciated at CT. 5. Moderately advanced periventricular white matter disease for age. Electronically signed by: Audree Leas MD 01/10/2024 05:39 PM EDT RP Workstation: [id number]   MR BRAIN WO  CONTRAST Result Date: 01/09/2024 CLINICAL DATA:  Neuro deficit, acute, stroke suspected left weakness EXAM: MRI HEAD WITHOUT CONTRAST TECHNIQUE: Multiplanar, multiecho pulse sequences of the brain and surrounding structures were obtained without intravenous contrast. COMPARISON:  CT head 02/26/2023. FINDINGS: Brain: Acute infarcts in the posterior limb of the right internal capsule and the overlying right frontal white matter. Additional acute infarct in the pons. Six remote infarct in the right frontal white matter, bilateral basal ganglia, and left periatrial white matter.  Small remote infarct in the pons. Scattered T2/FLAIR hyperintensity white matter compatible with chronic microvascular ischemic disease. No evidence of acute hemorrhage, mass lesion, midline shift or hydrocephalus. Vascular: Major arterial flow voids are maintained skull base. Skull and upper cervical spine: Normal marrow signal. Sinuses/Orbits: Negative. IMPRESSION: 1. Acute infarcts in the posterior limb of the right internal capsule, the overlying right frontal white matter, and pons. Given involvement of multiple vascular territories, consider an embolic etiology. 2. Multiple remote infarcts and chronic microvascular ischemic disease, detailed above. Electronically Signed   By: Stevenson Elbe M.D.   On: 01/09/2024 21:07   CT Orbits Wo Contrast Result Date: 12/29/2023 CLINICAL DATA:  Bilateral eye pain and swelling. EXAM: CT ORBITS WITHOUT CONTRAST TECHNIQUE: Multidetector CT imaging of the orbits was performed using the standard protocol without intravenous contrast. Multiplanar CT image reconstructions were also generated. RADIATION DOSE REDUCTION: This exam was performed according to the departmental dose-optimization program which includes automated exposure control, adjustment of the mA and/or kV according to patient size and/or use of iterative reconstruction technique. COMPARISON:  None Available. FINDINGS: Orbits: No orbital  mass or evidence of inflammation. Normal appearance of the globes, optic nerve-sheath complexes, extraocular muscles, orbital fat and lacrimal glands. Visible paranasal sinuses: Clear. Soft tissues: Normal. Osseous: No fracture or aggressive lesion. Limited intracranial: No acute or significant finding. IMPRESSION: Negative CT of the orbits. Electronically Signed   By: Virgle Grime M.D.   On: 12/29/2023 18:52    Microbiology: Results for orders placed or performed during the hospital encounter of 09/21/23  Resp panel by RT-PCR (RSV, Flu A&B, Covid) Anterior Nasal Swab     Status: None   Collection Time: 09/21/23  2:02 PM   Specimen: Anterior Nasal Swab  Result Value Ref Range Status   SARS Coronavirus 2 by RT PCR NEGATIVE NEGATIVE Final    Comment: (NOTE) SARS-CoV-2 target nucleic acids are NOT DETECTED.  The SARS-CoV-2 RNA is generally detectable in upper respiratory specimens during the acute phase of infection. The lowest concentration of SARS-CoV-2 viral copies this assay can detect is 138 copies/mL. A negative result does not preclude SARS-Cov-2 infection and should not be used as the sole basis for treatment or other patient management decisions. A negative result may occur with  improper specimen collection/handling, submission of specimen other than nasopharyngeal swab, presence of viral mutation(s) within the areas targeted by this assay, and inadequate number of viral copies(<138 copies/mL). A negative result must be combined with clinical observations, patient history, and epidemiological information. The expected result is Negative.  Fact Sheet for Patients:  BloggerCourse.com  Fact Sheet for Healthcare Providers:  SeriousBroker.it  This test is no t yet approved or cleared by the [place]  FDA and  has been authorized for detection and/or diagnosis of SARS-CoV-2 by FDA under an Emergency Use Authorization (EUA).  This EUA will remain  in effect (meaning this test can be used) for the duration of the COVID-19 declaration under Section 564(b)(1) of the Act, 21 U.S.C.section 360bbb-3(b)(1), unless the authorization is terminated  or revoked sooner.       Influenza A by PCR NEGATIVE NEGATIVE Final   Influenza B by PCR NEGATIVE NEGATIVE Final    Comment: (NOTE) The Xpert Xpress SARS-CoV-2/FLU/RSV plus assay is intended as an aid in the diagnosis of influenza from Nasopharyngeal swab specimens and should not be used as a sole basis for treatment. Nasal washings and aspirates are unacceptable for Xpert Xpress SARS-CoV-2/FLU/RSV testing.  Fact Sheet for Patients: BloggerCourse.com  Fact Sheet for Healthcare Providers: SeriousBroker.it  This test is not yet approved or cleared by the [place]  FDA and has been authorized for detection and/or diagnosis of SARS-CoV-2 by FDA under an Emergency Use Authorization (EUA). This EUA will remain in effect (meaning this test can be used) for the duration of the COVID-19 declaration under Section 564(b)(1) of the Act, 21 U.S.C. section 360bbb-3(b)(1), unless the authorization is terminated or revoked.     Resp Syncytial Virus by PCR NEGATIVE NEGATIVE Final    Comment: (NOTE) Fact Sheet for Patients: BloggerCourse.com  Fact Sheet for Healthcare Providers: SeriousBroker.it  This test is not yet approved or cleared by the [place]  FDA and has been authorized for detection and/or diagnosis of SARS-CoV-2 by FDA under an Emergency Use Authorization (EUA). This EUA will remain in effect (meaning this test can be used) for the duration of the COVID-19 declaration under Section 564(b)(1) of the Act, 21 U.S.C. section 360bbb-3(b)(1), unless the authorization is terminated or revoked.  Performed at [facility], [address]  [place], [place] [address]     Labs: CBC: Recent Labs  Lab 01/09/24 1741 01/13/24 0755 01/14/24 0239  WBC 5.6 5.7 5.9  NEUTROABS 3.5  --   --   HGB 14.3 14.5 14.6  HCT 44.5 44.3 44.8  MCV 94.1 91.9 92.9  PLT 277 281 280   Basic Metabolic Panel: Recent Labs  Lab 01/09/24 1741 01/11/24 1436 01/13/24 0755  NA 139  --  138  K 3.1* 3.5 3.5  CL 98  --  102  CO2 28  --  26  GLUCOSE 106*  --  100*  BUN 8  --  16  CREATININE 1.00  --  0.99  CALCIUM 9.0  --  9.0   Liver Function Tests: Recent Labs  Lab 01/09/24 1741  AST 20  ALT 13  ALKPHOS 61  BILITOT 0.9  PROT 8.1  ALBUMIN 3.9   CBG: Recent Labs  Lab 01/09/24 1749  GLUCAP 112*    Discharge time spent: greater than 30 minutes.  This record has been created using Conservation officer, historic buildings. Errors have been sought and corrected,but may not always be located. Such creation errors do not reflect on the standard of care.   Signed: Luna Salinas, MD [facility] 01/14/2024

## 2024-01-14 NOTE — Progress Notes (Signed)
 Patient ID: Mark Mcintosh, male   DOB: 08-22-1970, 53 y.o.   MRN: [id number]  INPATIENT REHABILITATION ADMISSION NOTE   Arrival Method: [facility]      Mental Orientation: x4   Assessment: see flowsheet   Skin: CDI   IV'S: left forearm   Pain: none reported   Tubes and Drains: n/a    Safety Measures: provided   Vital Signs: see flowsheet   Height and Weight: see flowsheet   Rehab Orientation: completed   Family: notified by patient

## 2024-01-14 NOTE — Discharge Summary (Signed)
 Physician Discharge Summary  Patient ID: Daxter Paule MRN: [id number] DOB/AGE: Jan 17, 1971 53 y.o.  Admit date: 01/14/2024 Discharge date: 01/30/2024  Discharge Diagnoses:  Right PLIC frontal and pontine infarction DVT prophylaxis Small PFO Hypertension Tobacco use Hyperlipidemia Class II morbid obesity Bilateral conjunctival inflammation  Discharged Condition: Stable  Significant Diagnostic Studies: ECHO TEE Result Date: 01/14/2024    TRANSESOPHOGEAL ECHO REPORT   Patient Name:   BERLE FITZ Date of Exam: 01/14/2024 Medical Rec #:  [id number]         Height:       71.0 in Accession #:    [id number]        Weight:       260.0 lb Date of Birth:  1971/06/17         BSA:          2.357 m Patient Age:    53 years          BP:           128/94 mmHg Patient Gender: M                 HR:           88 bpm. Exam Location:  [facility] Procedure: Transesophageal Echo, Cardiac Doppler, Color Doppler and Saline            Contrast Bubble Study (Both Spectral and Color Flow Doppler were            utilized during procedure). Indications:     Stroke  History:         Patient has prior history of Echocardiogram examinations, most                  recent 01/10/2024. Risk Factors:Hypertension.  Sonographer:     Broadus Canes Referring Phys:  [id number] DWAYNE D CALLWOOD Diagnosing Phys: Joetta Mustache PROCEDURE: After discussion of the risks and benefits of a TEE, an informed consent was obtained from the patient. The transesophogeal probe was passed without difficulty through the esophogus of the patient. Local oropharyngeal anesthetic was provided with Cetacaine . Sedation performed by different physician. The patient was monitored while under deep sedation. Image quality was excellent. The patient developed no complications during the procedure.  IMPRESSIONS  1. 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  2. Left ventricular ejection fraction, by  estimation, is 55 to 60%. The left ventricle has normal function.  3. Right ventricular systolic function is normal. The right ventricular size is normal.  4. No left atrial/left atrial appendage thrombus was detected.  5. The mitral valve is normal in structure. Trivial mitral valve regurgitation.  6. The aortic valve is tricuspid. Aortic valve regurgitation is not visualized.  7. Agitated saline contrast bubble study was positive with shunting observed within 3-6 cardiac cycles suggestive of interatrial shunt. There is a small patent foramen ovale. FINDINGS  Left Ventricle: Left ventricular ejection fraction, by estimation, is 55 to 60%. The left ventricle has normal function. The left ventricular internal cavity size was normal in size. Right Ventricle: The right ventricular size is normal. No increase in right ventricular wall thickness. Right ventricular systolic function is normal. Left Atrium: Left atrial size was normal in size. No left atrial/left atrial appendage thrombus was detected. Right Atrium: Right atrial size was normal in size. Pericardium: There is no evidence of pericardial effusion. Mitral Valve: The mitral valve is normal in structure. Trivial mitral valve regurgitation. Tricuspid Valve: The tricuspid valve  is normal in structure. Tricuspid valve regurgitation is mild. Aortic Valve: The aortic valve is tricuspid. Aortic valve regurgitation is not visualized. Pulmonic Valve: The pulmonic valve was normal in structure. Pulmonic valve regurgitation is not visualized. Aorta: The aortic root is normal in size and structure. IAS/Shunts: The interatrial septum is aneurysmal. Agitated saline contrast was given intravenously to evaluate for intracardiac shunting. Agitated saline contrast bubble study was positive with shunting observed within 3-6 cardiac cycles suggestive of interatrial shunt. A small patent foramen ovale is detected. Lida Reeks Alluri Electronically signed by Joetta Mustache Signature  Date/Time: 01/14/2024/1:35:26 PM    Final    US  Venous Img Lower Bilateral (DVT) Result Date: 01/11/2024 CLINICAL DATA:  Stroke EXAM: BILATERAL LOWER EXTREMITY VENOUS DOPPLER ULTRASOUND TECHNIQUE: Gray-scale sonography with compression, as well as color and duplex ultrasound, were performed to evaluate the deep venous system(s) from the level of the common femoral vein through the popliteal and proximal calf veins. COMPARISON:  None Available. FINDINGS: VENOUS Normal compressibility of the common femoral, superficial femoral, and popliteal veins, as well as the visualized calf veins. Visualized portions of profunda femoral vein and great saphenous vein unremarkable. No filling defects to suggest DVT on grayscale or color Doppler imaging. Doppler waveforms show normal direction of venous flow, normal respiratory plasticity and response to augmentation. OTHER None. Limitations: none IMPRESSION: Negative. Electronically Signed   By: Reagan Camera M.D.   On: 01/11/2024 11:09   ECHOCARDIOGRAM COMPLETE BUBBLE STUDY Result Date: 01/10/2024    ECHOCARDIOGRAM REPORT   Patient Name:   KHORI ROSEVEAR Date of Exam: 01/10/2024 Medical Rec #:  [id number]         Height:       71.0 in Accession #:    [id number]        Weight:       260.0 lb Date of Birth:  03-11-1971         BSA:          2.357 m Patient Age:    53 years          BP:           127/95 mmHg Patient Gender: M                 HR:           72 bpm. Exam Location:  [facility] Procedure: 2D Echo, Cardiac Doppler, Color Doppler and Saline Contrast Bubble            Study (Both Spectral and Color Flow Doppler were utilized during            procedure). Indications:     Stroke 434.91 / I63.9  History:         Patient has no prior history of Echocardiogram examinations.                  Risk Factors:Hypertension.  Sonographer:     Kathaleen Pale Roar Referring Phys:  [id number] Lanetta Pion Diagnosing Phys: Sheryle Donning MD IMPRESSIONS  1. Left ventricular ejection fraction,  by estimation, is 55 to 60%. The left ventricle has normal function. The left ventricle has no regional wall motion abnormalities. Left ventricular diastolic parameters are indeterminate.  2. Right ventricular systolic function is normal. The right ventricular size is normal. There is mildly elevated pulmonary artery systolic pressure.  3. The mitral valve is normal in structure. Trivial mitral valve regurgitation. No evidence of mitral stenosis.  4. The aortic valve is normal in structure. Aortic  valve regurgitation is trivial. No aortic stenosis is present.  5. The inferior vena cava is normal in size with greater than 50% respiratory variability, suggesting right atrial pressure of 3 mmHg.  6. Agitated saline contrast bubble study was positive with shunting observed after >6 cardiac cycles suggestive of intrapulmonary shunting. Comparison(s): No prior Echocardiogram. Conclusion(s)/Recommendation(s): Late positive bubble study, suggestive of extracardiac shunt. Septum appears aneurysmal, but there is no clear left to right shunting on available images. Consider TEE with bubble study if high suspicion for embolic source of CVA. FINDINGS  Left Ventricle: Left ventricular ejection fraction, by estimation, is 55 to 60%. The left ventricle has normal function. The left ventricle has no regional wall motion abnormalities. The left ventricular internal cavity size was normal in size. There is  borderline left ventricular hypertrophy. Left ventricular diastolic parameters are indeterminate. Right Ventricle: The right ventricular size is normal. No increase in right ventricular wall thickness. Right ventricular systolic function is normal. There is mildly elevated pulmonary artery systolic pressure. The tricuspid regurgitant velocity is 2.95  m/s, and with an assumed right atrial pressure of 3 mmHg, the estimated right ventricular systolic pressure is 37.8 mmHg. Left Atrium: Left atrial size was normal in size. Right  Atrium: Right atrial size was normal in size. Pericardium: There is no evidence of pericardial effusion. Mitral Valve: The mitral valve is normal in structure. Trivial mitral valve regurgitation. No evidence of mitral valve stenosis. MV peak gradient, 5.5 mmHg. The mean mitral valve gradient is 1.0 mmHg. Tricuspid Valve: The tricuspid valve is normal in structure. Tricuspid valve regurgitation is trivial. No evidence of tricuspid stenosis. Aortic Valve: The aortic valve is normal in structure. Aortic valve regurgitation is trivial. No aortic stenosis is present. Aortic valve mean gradient measures 3.0 mmHg. Aortic valve peak gradient measures 6.0 mmHg. Aortic valve area, by VTI measures 3.83 cm. Pulmonic Valve: The pulmonic valve was grossly normal. Pulmonic valve regurgitation is trivial. No evidence of pulmonic stenosis. Aorta: The aortic root and ascending aorta are structurally normal, with no evidence of dilitation. Ascending aorta measurements are within normal limits for age when indexed to body surface area. Venous: The inferior vena cava is normal in size with greater than 50% respiratory variability, suggesting right atrial pressure of 3 mmHg. IAS/Shunts: The interatrial septum is aneurysmal. The atrial septum is grossly normal. Agitated saline contrast was given intravenously to evaluate for intracardiac shunting. Agitated saline contrast bubble study was positive with shunting observed after  >6 cardiac cycles suggestive of intrapulmonary shunting.  LEFT VENTRICLE PLAX 2D LVIDd:         4.30 cm     Diastology LVIDs:         3.00 cm     LV e' medial:    8.16 cm/s LV PW:         1.20 cm     LV E/e' medial:  10.9 LV IVS:        1.20 cm     LV e' lateral:   8.70 cm/s LVOT diam:     2.10 cm     LV E/e' lateral: 10.2 LV SV:         69 LV SV Index:   29 LVOT Area:     3.46 cm  LV Volumes (MOD) LV vol d, MOD A2C: 81.8 ml LV vol d, MOD A4C: 80.1 ml LV vol s, MOD A2C: 34.4 ml LV vol s, MOD A4C: 35.7 ml LV SV MOD  A2C:  47.4 ml LV SV MOD A4C:     80.1 ml LV SV MOD BP:      45.4 ml RIGHT VENTRICLE RV Basal diam:  3.60 cm RV Mid diam:    3.30 cm RV S prime:     11.90 cm/s TAPSE (M-mode): 2.6 cm LEFT ATRIUM             Index        RIGHT ATRIUM           Index LA diam:        3.90 cm 1.65 cm/m   RA Area:     18.50 cm LA Vol (A2C):   49.3 ml 20.91 ml/m  RA Volume:   49.20 ml  20.87 ml/m LA Vol (A4C):   56.7 ml 24.05 ml/m LA Biplane Vol: 53.2 ml 22.57 ml/m  AORTIC VALVE                    PULMONIC VALVE AV Area (Vmax):    3.35 cm     PV Vmax:          1.07 m/s AV Area (Vmean):   3.26 cm     PV Peak grad:     4.6 mmHg AV Area (VTI):     3.83 cm     PR End Diast Vel: 7.29 msec AV Vmax:           122.00 cm/s  RVOT Peak grad:   2 mmHg AV Vmean:          74.800 cm/s AV VTI:            0.181 m AV Peak Grad:      6.0 mmHg AV Mean Grad:      3.0 mmHg LVOT Vmax:         118.00 cm/s LVOT Vmean:        70.300 cm/s LVOT VTI:          0.200 m LVOT/AV VTI ratio: 1.10  AORTA Ao Root diam: 3.20 cm Ao Asc diam:  3.80 cm MITRAL VALVE                TRICUSPID VALVE MV Area (PHT): 4.49 cm     TR Peak grad:   34.8 mmHg MV Area VTI:   2.14 cm     TR Vmax:        295.00 cm/s MV Peak grad:  5.5 mmHg MV Mean grad:  1.0 mmHg     SHUNTS MV Vmax:       1.17 m/s     Systemic VTI:  0.20 m MV Vmean:      54.7 cm/s    Systemic Diam: 2.10 cm MV Decel Time: 169 msec MV E velocity: 88.60 cm/s MV A velocity: 112.00 cm/s MV E/A ratio:  0.79 MV A Prime:    12.6 cm/s Sheryle Donning MD Electronically signed by Sheryle Donning MD Signature Date/Time: 01/10/2024/6:49:32 PM    Final    CT ANGIO HEAD NECK W WO CM Result Date: 01/10/2024 EXAM: CT HEAD WITHOUT CTA HEAD AND NECK WITH AND WITHOUT 01/10/2024 01:39:58 PM TECHNIQUE: CTA of the head and neck was performed with and without the administration of intravenous contrast. Noncontrast CT of the head with reconstructed 2-D images are also provided for review. Multiplanar 2D and/or 3D reformatted  images are provided for review. Automated exposure control, iterative reconstruction, and/or weight based adjustment of the mA/kV was utilized to reduce the radiation dose to as  low as reasonably achievable. COMPARISON: 01/09/2024 CLINICAL HISTORY: Stroke, follow up. FINDINGS: CT HEAD: BRAIN AND VENTRICLES: A remote lacunar infarct is again noted in the genu of the left internal capsule and globus pallidus. Remote lacunar infarcts are present in the right corona radiata. Periventricular white matter disease is moderately advanced for age. The acute infarct involving the posterior limb of the right internal capsule is less well appreciated. No acute intracranial hemorrhage. No mass effect or midline shift. No extra-axial fluid collection. Gray-white differentiation is maintained. No hydrocephalus. ORBITS: No acute abnormality. SINUSES: No acute abnormality. SOFT TISSUES AND SKULL: No acute abnormality. CTA NECK: AORTIC ARCH AND ARCH VESSELS: No dissection or arterial injury. No significant stenosis of the brachiocephalic or subclavian arteries. CERVICAL CAROTID ARTERIES: No dissection, arterial injury, or hemodynamically significant stenosis by NASCET criteria. CERVICAL VERTEBRAL ARTERIES: The left vertebral artery is dominant. No dissection, arterial injury, or significant stenosis. VISUALIZED LUNGS AND MEDIASTINUM: Centrilobular emphysematous changes are present. SOFT TISSUES: No acute abnormality. BONES: Mild degenerative changes are present in the lower cervical spine. Extensive dental disease is present. No focal osseous lesions are present. CTA HEAD: ANTERIOR CIRCULATION: No significant stenosis of the internal carotid arteries. No significant stenosis of the anterior cerebral arteries. No significant stenosis of the middle cerebral arteries. No aneurysm. POSTERIOR CIRCULATION: No significant stenosis of the posterior cerebral arteries. No significant stenosis of the basilar artery. No significant stenosis of  the vertebral arteries. No aneurysm. OTHER: No dural venous sinus thrombosis on this non-dedicated study. IMPRESSION: 1. No acute intracranial hemorrhage or mass effect. 2. No large vessel occlusion, hemodynamically significant stenosis, or aneurysm in the head or neck. 3. Remote lacunar infarcts in the genu of the left internal capsule, globus pallidus, and right corona radiata. 4. Acute infarct involving the posterior limb of the right internal capsule is less well appreciated at CT. 5. Moderately advanced periventricular white matter disease for age. Electronically signed by: Audree Leas MD 01/10/2024 05:39 PM EDT RP Workstation: [id number]   MR BRAIN WO CONTRAST Result Date: 01/09/2024 CLINICAL DATA:  Neuro deficit, acute, stroke suspected left weakness EXAM: MRI HEAD WITHOUT CONTRAST TECHNIQUE: Multiplanar, multiecho pulse sequences of the brain and surrounding structures were obtained without intravenous contrast. COMPARISON:  CT head 02/26/2023. FINDINGS: Brain: Acute infarcts in the posterior limb of the right internal capsule and the overlying right frontal white matter. Additional acute infarct in the pons. Six remote infarct in the right frontal white matter, bilateral basal ganglia, and left periatrial white matter. Small remote infarct in the pons. Scattered T2/FLAIR hyperintensity white matter compatible with chronic microvascular ischemic disease. No evidence of acute hemorrhage, mass lesion, midline shift or hydrocephalus. Vascular: Major arterial flow voids are maintained skull base. Skull and upper cervical spine: Normal marrow signal. Sinuses/Orbits: Negative. IMPRESSION: 1. Acute infarcts in the posterior limb of the right internal capsule, the overlying right frontal white matter, and pons. Given involvement of multiple vascular territories, consider an embolic etiology. 2. Multiple remote infarcts and chronic microvascular ischemic disease, detailed above. Electronically Signed   By:  Stevenson Elbe M.D.   On: 01/09/2024 21:07    Labs:  Basic Metabolic Panel: No results for input(s): NA, K, CL, CO2, GLUCOSE, BUN, CREATININE, CALCIUM , MG, PHOS in the last 168 hours.   CBC: No results for input(s): WBC, NEUTROABS, HGB, HCT, MCV, PLT in the last 168 hours.   CBG: No results for input(s): GLUCAP in the last 168 hours.   Brief HPI:   Evon Lopezperez is  a 53 y.o. right-handed male with history significant for bilateral conjunctival inflammation hypertension as well as tobacco use and class II morbid obesity with BMI 36.26.  Per chart review patient lives alone independent prior to admission.  [facility] with acute onset of left-sided weakness.  MRI showed acute infarct in the posterior limb of the right internal capsule, the overlying right frontal white matter and pons.  Multiple remote infarcts and chronic microvascular ischemic disease.  CTA showed no large vessel occlusion.  Admission chemistries unremarkable except potassium 3.1.  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  TEE with small PFO per cardiology services with no plan for closure.  Neurology follow-up placed on aspirin  and Plavix  for CVA prophylaxis x 3 weeks then aspirin  alone.  Lovenox  for DVT prophylaxis but bilateral Doppler studies negative.  Therapy evaluations completed due to patient decreased functional mobility left-sided weakness was admitted for a comprehensive rehab program.   Hospital Course: Math Brazie was admitted to rehab 01/14/2024 for inpatient therapies to consist of PT, ST and OT at least three hours five days a week. Past admission physiatrist, therapy team and rehab RN have worked together to provide customized collaborative inpatient rehab.  Pertaining to patient's right PLIC frontal and pontine infarction remained stable followed by neurology services continued aspirin  and Plavix  x 3 weeks then aspirin  alone.  During workup noted  finding of small PFO no plan for closure and follow-up outpatient cardiology services.  Blood pressure remained controlled on Norvasc  and monitored with increased mobility.  Lipitor ongoing for hyperlipidemia.  Class II morbid obesity with BMI 36.26 dietary follow-up.  History of tobacco use NicoDerm patch as directed providing counseling guard cessation of nicotine  products.  Bilateral conjunctival inflammation continued eyedrops follow-up outpatient ophthalmology services   Blood pressures were monitored on TID basis and remained controlled and monitored      Rehab course: During patient's stay in rehab weekly team conferences were held to monitor patient's progress, set goals and discuss barriers to discharge. At admission, patient required min mod assist 160 feet contact-guard minimal assist sit to stand  Physical exam.  Blood pressure 128/94 pulse 88 temperature 98.4 respirations 21 oxygen  saturation 96% room air Constitutional.  No acute distress HEENT Head.  Normocephalic and atraumatic Eyes.  Pupils round and reactive to light no discharge without nystagmus Neck.  Supple nontender no JVD without thyromegaly Cardiac regular rate and rhythm without any extra sounds or murmur heard Abdomen.  Soft nontender positive bowel sounds without rebound Respiratory effort normal no respiratory distress without wheeze Skin.  Intact Neurologic.  Alert oriented.  No acute distress.  Mild dysarthria but fully intelligible.  Oriented x 3.  4/5 strength in left leg mild left facial droop  He/She  has had improvement in activity tolerance, balance, postural control as well as ability to compensate for deficits. He/She has had improvement in functional use RUE/LUE  and RLE/LLE as well as improvement in awareness.  Perform sit to stand contact-guard and cues for initiation.  Ambulates 80 feet min mod assist and cues for postural stability.  Increase his ambulation up to 175 feet with same assistance and  cues.  Completes alternating foot taps on 4 inch step without assistive device requiring minimal assist.  Completed toileting shower and dressing edge of bed.  Using rolling walker ambulates bed to toilet and shower to bed with contact-guard.  Standing at toilet to self CarraKlenz using left hand on bar with contact-guard, contact-guard and standing with pulling clothing  over hips or standing in shower.  Speech therapy did follow-up with utilizing strategies to articulate with supervision assist to reach 90% intelligibility.  Full family teaching completed plan discharge to home.       Disposition:  Discharge disposition: 01-Home or Self Care        Diet: Regular  Special Instructions: No driving smoking or alcohol  Medications at discharge. 1.  Tylenol  as needed 2.  Norvasc  2.5 mg p.o. daily 3.  Aspirin  81 mg p.o. daily 4.  Lipitor 40 mg p.o. daily 5.  Prednisolone  forte 1% 1 drop both eyes 4 times daily 6.  NicoDerm patch taper as directed 7.  Avapro  75 mg daily 8.  Ventolin  inhaler 2 puffs every 6 hours as needed 9.  Vitamin D  1000 units daily   30-35 minutes were spent completing discharge summary and discharge planning  Discharge Instructions     Ambulatory referral to Neurology   Complete by: As directed    An appointment is requested in approximately: 4 weeks right PLIC frontal pontine infarction   Ambulatory referral to Occupational Therapy   Complete by: As directed    Eval and treat   Ambulatory referral to Physical Medicine Rehab   Complete by: As directed    Moderate complexity follow-up 1 to 2 weeks right PLIC, frontal and pontine infarction   Ambulatory referral to Physical Therapy   Complete by: As directed    Eval and treat        Follow-up Information     Kirsteins, Cecilia Coe, MD Follow up.   Specialty: Physical Medicine and Rehabilitation Why: Office to call for appointment Contact information: [address] [place]  [address] [phone]         Antonette Batters, MD Follow up.   Specialties: Cardiology, Internal Medicine Why: Call for appointment Contact information: [address] [place] [address] [phone]         Annell Kidney, MD Follow up.   Specialty: Ophthalmology Why: Call for appointment Contact information: [address]   [place] [address] [phone]                 Signed: Sterling Eisenmenger 01/30/2024, 4:34 AM

## 2024-01-14 NOTE — Progress Notes (Signed)
 Rawland Caddy, MD  Physician Physical Medicine and Rehabilitation   Progress Notes    Addendum   Date of Service: 01/13/2024  1:01 PM  Related encounter: ED to Hosp-Admission (Current) from 01/09/2024 in [facility] 1C MEDICAL TELEMETRY    Show:Clear all [x] Written[x] Templated[] Copied  Added by: [x] Rawland Caddy, MD  [] Hover for details Physical Medicine & Rehabilitation Consult Service   Pt discussed with rehab admissions coordinator. Chart has been reviewed. This is a 53 yo male who presented on 5/23 with acute onset left hemiparesis. MRI positive for acute infarct of right PLIC, right frontal white matter, and right pons. 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 TEE tomorrow. ASA 81mg  + plavix 75mg  for 21 days followed by ASA alone recommended at this time. Pt up with therapy yesterday and today. Was min to mod assist for gait 200' using RW and min to contact guard for sit to std transfer. Pt min assist for basic ADL's.  Pt lives alone in a one level home with one step to enter. He was independent and working PTA.     Home: Home Living Family/patient expects to be discharged to:: Private residence Living Arrangements: Alone Type of Home: House Home Access: Stairs to enter Secretary/administrator of Steps: 1 Home Layout: One level Home Equipment: None  Lives With: Alone  Functional History: Prior Function Prior Level of Function : Independent/Modified Independent, Working/employed, Driving Functional Status:  Mobility: Bed Mobility Overal bed mobility: Needs Assistance Bed Mobility: Supine to Sit, Sit to Supine Supine to sit: Supervision Sit to supine: Supervision General bed mobility comments: no physical assistance however ot neglecting LUE. Poor overall coordination of movements Transfers Overall transfer level: Needs assistance Equipment used: Rolling walker (2 wheels) Transfers: Sit to/from Stand Sit to Stand: Min assist General transfer  comment: Pt was able to stand with and without AD. Pt very unsteady wiuthout UE support Ambulation/Gait Ambulation/Gait assistance: Mod assist, Min assist Gait Distance (Feet): 200 Feet Assistive device: None, 1 person hand held assist, Rolling walker (2 wheels) Gait Pattern/deviations: Staggering left General Gait Details: Pt ambulated 200 ft with RW however attempted ambulation with only +1 UE support and have 3 occasions of LOB with intervention to prevent falling. PT needs constant vcs to not neglect LUE on RW. poor ability to maintain grip on RW. pt remains high fall risk and was encouraged to use RW at all times until balamnce improves Gait velocity: decreased   ADL: ADL Overall ADL's : Needs assistance/impaired Grooming: Oral care, Standing, Minimal assistance Lower Body Dressing: Supervision/safety, Sit to/from stand Lower Body Dressing Details (indicate cue type and reason): Donning/doffing bilateral socks while seated on EOB Toilet Transfer: Ambulation, Contact guard assist Toilet Transfer Details (indicate cue type and reason): Simulated toilet t/f Functional mobility during ADLs: Contact guard assist General ADL Comments: Completes seated ADL tasks with no physical assistance, standing grooming tasks anticipate CGA for safety   Cognition: Cognition Overall Cognitive Status: Within Functional Limits for tasks assessed (family not present to verify baseline) Arousal/Alertness: Awake/alert Orientation Level: Oriented X4 Attention: Sustained Sustained Attention: Appears intact Awareness: Appears intact Problem Solving: Appears intact Safety/Judgment: Appears intact Cognition Arousal: Alert Behavior During Therapy: WFL for tasks assessed/performed Overall Cognitive Status: Within Functional Limits for tasks assessed (family not present to verify baseline)     Assessment: Right PLIC, frontal and pontine infarcts, likely embolic     Plan:   This patient would benefit  from acute inpatient rehab to address mobility, balance, self-care. Additionally, the  patient requires daily MD oversight of the active medical issues noted above. Projected goals would be mod I with an ELOS of 7-10 days.  It appears that he needs to be essentially  modified independent to return home.       Rehab Admissions Coordinator to follow up.     Rawland Caddy, MD, [facility] Physical Medicine & Rehabilitation Medical Director Rehabilitation Services 01/13/2024       Revision History  Routing History

## 2024-01-14 NOTE — PMR Pre-admission (Signed)
 PMR Admission Coordinator Pre-Admission Assessment  Patient: Mark Mcintosh is an 53 y.o., male MRN: [id number] DOB: 1971/06/26 Height: 5\' 11"  (180.3 cm) Weight: 117.9 kg  Insurance Information HMO:     PPO: yes     PCP:      IPA:      80/20:      OTHER:  PRIMARY: BCBS Comm PPO      Policy#: [id number]      Subscriber: pt CM Name: Fredrik Jensen      Phone#: [phone] option [phone] ext [phone]     Fax#: [phone] Pre-Cert#: [id number] approved 5/28 until 6/5      Employer: [employer], [employer] Active, [place] BCBS Benefits:  Phone #: [phone]     Name: 5/27 Eff. Date: 08/20/23     Deduct: $3300      Out of Pocket Max: $6550      Life Max: none CIR: 100%      SNF: 100% Outpatient: 100%     Co-Pay: 60 visits combined Home Health: 100%      Co-Pay:  DME: 100%     Co-Pay:  Providers: in network  SECONDARY: none  Financial Counselor:       Phone#:   The Data processing manager" for patients in Inpatient Rehabilitation Facilities with attached "Privacy Act Statement-Health Care Records" was provided and verbally reviewed with: Patient  Emergency Contact Information Contact Information     Name Relation Home Work Mobile   [place] Mother   [phone]      Other Contacts     Name Relation Home Work Mobile   [place],Belinda Friend   [phone]       Current Medical History  Patient Admitting Diagnosis: CVA  History of Present Illness: [name] is a 53 year old right-handed male with history of bilateral conjunctival inflammation followed by ophthalmology services Dr. Ignatius Makos, hypertension as well as tobacco use and class II morbid obesity with BMI 36.26.  Per chart review patient lives alone.  1 level home one-step to enter.  Independent prior to admission.  [facility] 01/09/2024 with acute onset of left-sided weakness.    MRI showed acute infarcts in the posterior limb of the right internal capsule, the overlying right frontal  white matter, and pons.  Multiple remote infarcts and chronic microvascular ischemic disease.  CTA showed no large vessel occlusion.  Admission chemistries unremarkable except potassium 3.1, hemoglobin A1c 5.7.  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  TEE  5/28 with small PFO. Neurology felt not likely to benefit from any kind of PFO closure evaluation.  Neurology follow-up placed on aspirin and Plavix for CVA prophylaxis x 3 weeks then aspirin alone.  Lovenox for DVT prophylaxis and bilateral lower extremity Dopplers negative.   Complete NIHSS TOTAL: 6  Patient's medical record from [facility] has been reviewed by the rehabilitation admission coordinator and physician.  Past Medical History  Past Medical History:  Diagnosis Date   Arthritis    Hypertension    Has the patient had major surgery during 100 days prior to admission? No  Family History   family history is not on file.  Current Medications  Current Facility-Administered Medications:    [MAR Hold] acetaminophen  (TYLENOL ) tablet 650 mg, 650 mg, Oral, Q4H PRN **OR** [MAR Hold] acetaminophen  (TYLENOL ) 160 MG/5ML solution 650 mg, 650 mg, Per Tube, Q4H PRN **OR** [MAR Hold] acetaminophen  (TYLENOL ) suppository 650 mg, 650 mg, Rectal, Q4H PRN, Duncan, Hazel V, MD   [MAR Hold] amLODipine  (NORVASC )  tablet 10 mg, 10 mg, Oral, Daily, Ayiku, Bernard, MD   [MAR Hold] aspirin EC tablet 81 mg, 81 mg, Oral, Daily, Duncan, Hazel V, MD, 81 mg at 01/13/24 1410   [MAR Hold] atorvastatin (LIPITOR) tablet 40 mg, 40 mg, Oral, Daily, Stack, Colleen M, MD, 40 mg at 01/13/24 1410   [MAR Hold] clopidogrel (PLAVIX) tablet 75 mg, 75 mg, Oral, Daily, Brion Cancel V, MD, 75 mg at 01/13/24 1410   [MAR Hold] enoxaparin (LOVENOX) injection 60 mg, 0.5 mg/kg, Subcutaneous, Q24H, Lanetta Pion, MD, 60 mg at 01/13/24 2112   [facility] Hold] hydrALAZINE (APRESOLINE) tablet 25 mg, 25 mg, Oral, Q6H PRN, Ayiku, Bernard, MD   [MAR Hold] nicotine (NICODERM CQ - dosed in mg/24  hours) patch 21 mg, 21 mg, Transdermal, Daily, Brion Cancel V, MD, 21 mg at 01/14/24 1044   [MAR Hold] prednisoLONE  acetate (PRED FORTE ) 1 % ophthalmic suspension 1 drop, 1 drop, Both Eyes, QID, Sheril Dines, MD, 1 drop at 01/13/24 2103   [facility] Hold] sodium chloride  flush (NS) 0.9 % injection 3-10 mL, 3-10 mL, Intravenous, Q12H, Callwood, Dwayne D, MD, 10 mL at 01/14/24 1044   [MAR Hold] sodium chloride  flush (NS) 0.9 % injection 3-10 mL, 3-10 mL, Intravenous, PRN, Callwood, Dwayne D, MD  Patients Current Diet:  Diet Order             Diet NPO time specified  Diet effective midnight           Diet - low sodium heart healthy                  Precautions / Restrictions Precautions Precautions: Fall Restrictions Weight Bearing Restrictions Per Provider Order: No   Has the patient had 2 or more falls or a fall with injury in the past year? No  Prior Activity Level Community (5-7x/wk): independent, working and driving  Prior Functional Level Self Care: Did the patient need help bathing, dressing, using the toilet or eating? Independent  Indoor Mobility: Did the patient need assistance with walking from room to room (with or without device)? Independent  Stairs: Did the patient need assistance with internal or external stairs (with or without device)? Independent  Functional Cognition: Did the patient need help planning regular tasks such as shopping or remembering to take medications? Independent  Patient Information Are you of Hispanic, Latino/a,or Spanish origin?: A. No, not of Hispanic, Latino/a, or Spanish origin What is your race?: B. Black or African American Do you need or want an interpreter to communicate with a doctor or health care staff?: 0. No  Patient's Response To:  Health Literacy and Transportation Is the patient able to respond to health literacy and transportation needs?: Yes Health Literacy - How often do you need to have someone help you when you read  instructions, pamphlets, or other written material from your doctor or pharmacy?: Never In the past 12 months, has lack of transportation kept you from medical appointments or from getting medications?: No In the past 12 months, has lack of transportation kept you from meetings, work, or from getting things needed for daily living?: No  Home Assistive Devices / Equipment Home Equipment: None  Prior Device Use: Indicate devices/aids used by the patient prior to current illness, exacerbation or injury? None of the above  Current Functional Level Cognition  Arousal/Alertness: Awake/alert Overall Cognitive Status: Within Functional Limits for tasks assessed (family not present to verify baseline) Orientation Level: Oriented X4 Attention: Sustained Sustained Attention: Appears intact Awareness:  Appears intact Problem Solving: Appears intact Safety/Judgment: Appears intact    Extremity Assessment (includes Sensation/Coordination)  Upper Extremity Assessment: Right hand dominant, LUE deficits/detail RUE Deficits / Details: Reported numbess in finger tips LUE Deficits / Details: L UE grossly 4-/5, mild drift/inattention with bimanual tasks; denies sensory deficit LUE Sensation: WNL LUE Coordination: decreased fine motor, decreased gross motor  Lower Extremity Assessment: Defer to PT evaluation LLE Deficits / Details: grossly 4+/5; denies sensory deficit    ADLs  Overall ADL's : Needs assistance/impaired Grooming: Oral care, Standing, Minimal assistance Lower Body Dressing: Supervision/safety, Sit to/from stand Lower Body Dressing Details (indicate cue type and reason): Donning/doffing bilateral socks while seated on EOB Toilet Transfer: Ambulation, Contact guard assist Toilet Transfer Details (indicate cue type and reason): Simulated toilet t/f Functional mobility during ADLs: Contact guard assist General ADL Comments: Completes seated ADL tasks with no physical assistance, standing  grooming tasks anticipate CGA for safety    Mobility  Overal bed mobility: Needs Assistance Bed Mobility: Supine to Sit, Sit to Supine Supine to sit: Supervision Sit to supine: Supervision General bed mobility comments: no physical assistance however a little impulsive. Vcs to slow down and for improved technique    Transfers  Overall transfer level: Needs assistance Equipment used: Rolling walker (2 wheels), 1 person hand held assist, None Transfers: Sit to/from Stand Sit to Stand: Contact guard assist, Min assist General transfer comment: CGA to stand from EOB to RW, min assist from lowest bed height to standing without UE support. continued vcs for slowing down    Ambulation / Gait / Stairs / Wheelchair Mobility  Ambulation/Gait Ambulation/Gait assistance: Contact guard assist, Min assist, Mod assist Gait Distance (Feet): 160 Feet Assistive device: Rolling walker (2 wheels), 1 person hand held assist, None Gait Pattern/deviations: Decreased dorsiflexion - left, Decreased stride length General Gait Details: pt ambulated 2 x 160 ft. 1 x 160 with RW + acewrap applied to L hand to prevent hand from sliding off RW. Min assist throughout with ambulation with +1 HHA, mod assist to ambulate without AD. pt remains high fall risk. Throughout ambulation with AD and without, Vcs for L side awareness and improved foot clearance on LLE. Once fatigued, pt tends to slide LLE with poor clearance. Pt does have active dorsiflexion but tends to have flat foot gait during stance phase. Gait velocity: decreased    Posture / Balance Dynamic Sitting Balance Sitting balance - Comments: one occasion of LOB towards L while seated EOB Balance Overall balance assessment: Needs assistance Sitting-balance support: No upper extremity supported, Feet supported Sitting balance-Leahy Scale: Good Sitting balance - Comments: one occasion of LOB towards L while seated EOB Standing balance support: No upper extremity  supported, During functional activity, Reliant on assistive device for balance Standing balance-Leahy Scale: Poor Standing balance comment: Pt is at high risk of falls without UE support during dynamic standing activity Standardized Balance Assessment Standardized Balance Assessment : Berg Balance Test Berg Balance Test Sit to Stand: Able to stand without using hands and stabilize independently Standing Unsupported: Able to stand safely 2 minutes Sitting with Back Unsupported but Feet Supported on Floor or Stool: Able to sit safely and securely 2 minutes Stand to Sit: Controls descent by using hands Transfers: Able to transfer safely, definite need of hands Standing Unsupported with Eyes Closed: Able to stand 10 seconds with supervision Standing Ubsupported with Feet Together: Able to place feet together independently but unable to hold for 30 seconds From Standing, Reach Forward with Outstretched  Arm: Reaches forward but needs supervision From Standing Position, Pick up Object from Floor: Able to pick up shoe, needs supervision From Standing Position, Turn to Look Behind Over each Shoulder: Looks behind one side only/other side shows less weight shift Turn 360 Degrees: Needs close supervision or verbal cueing Standing Unsupported, Alternately Place Feet on Step/Stool: Able to complete >2 steps/needs minimal assist Standing Unsupported, One Foot in Front: Needs help to step but can hold 15 seconds Standing on One Leg: Tries to lift leg/unable to hold 3 seconds but remains standing independently Total Score: 34    Special needs/care consideration Does not have PCP   Previous Home Environment  Living Arrangements: Alone  Lives With: Alone Available Help at Discharge: Friend(s), Available PRN/intermittently Type of Home: House Home Layout: One level Home Access: Stairs to enter [employer] of Steps: 1 Bathroom Shower/Tub: Engineer, manufacturing systems: Standard Bathroom  Accessibility: Yes How Accessible: Accessible via walker Home Care Services: No Additional Comments: works 2nd shift. No PCP, friend, Casandra Claw, assist prn  Discharge Living Setting Plans for Discharge Living Setting: Patient's home, Alone, House Type of Home at Discharge: House Discharge Home Layout: One level Discharge Home Access: Stairs to enter Entrance Stairs-Rails: None Entrance Stairs-Number of Steps: 1 Discharge Bathroom Shower/Tub: Tub/shower unit Discharge Bathroom Toilet: Standard Discharge Bathroom Accessibility: Yes How Accessible: Accessible via walker Does the patient have any problems obtaining your medications?: No  Social/Family/Support Systems Contact Information: friend, Market researcher, primary, not his Mom Anticipated Caregiver: friend prn only Anticipated Caregiver's Contact Information: see contacts Ability/Limitations of Caregiver: prn only Caregiver Availability: Intermittent Discharge Plan Discussed with Primary Caregiver: Yes Is Caregiver In Agreement with Plan?: Yes Does Caregiver/Family have Issues with Lodging/Transportation while Pt is in Rehab?: No  Goals Patient/Family Goal for Rehab: Mod I with PT and OT Expected length of stay: ELOS 7 to 10 days Additional Information: Has no PCP. Recently ran out of meds when out of town left meds at home Pt/Family Agrees to Admission and willing to participate: Yes Program Orientation Provided & Reviewed with Pt/Caregiver Including Roles  & Responsibilities: Yes  Decrease burden of Care through IP rehab admission: n/a  Possible need for SNF placement upon discharge: not anticipated  Patient Condition: I have reviewed medical records from [facility], spoken with  patient. I discussed via phone for inpatient rehabilitation assessment.  Patient will benefit from ongoing PT and OT, can actively participate in 3 hours of therapy a day 5 days of the week, and can make measurable gains during the admission.  Patient will  also benefit from the coordinated team approach during an Inpatient Acute Rehabilitation admission.  The patient will receive intensive therapy as well as Rehabilitation physician, nursing, social worker, and care management interventions.  Due to bladder management, bowel management, safety, skin/wound care, disease management, medication administration, pain management, and patient education the patient requires 24 hour a day rehabilitation nursing.  The patient is currently min to mod assist overall with mobility and basic ADLs.  Discharge setting and therapy post discharge at home with home health is anticipated.  Patient has agreed to participate in the Acute Inpatient Rehabilitation Program and will admit today.  Preadmission Screen Completed By:  Tanya Fantasia RN MSN 01/14/2024 1:23 PM ______________________________________________________________________   Discussed status with Dr. Alessandra Ancona on 01/14/24 at 1323 and received approval for admission today.  Admission Coordinator:  Tanya Fantasia, RN MSN time 1323 Date 01/14/24   Assessment/Plan: Diagnosis: Acute CVA Does the need for close,  24 hr/day Medical supervision in concert with the patient's rehab needs make it unreasonable for this patient to be served in a less intensive setting? Yes Co-Morbidities requiring supervision/potential complications:  Class 2 obesity: provide dietary education Constipation: provide dietary education, consider stool softener HTN: continue amlodipine  HLD: continue statin Active smoker: continue nicotine  patch Due to bladder management, bowel management, safety, skin/wound care, disease management, medication administration, pain management, and patient education, does the patient require 24 hr/day rehab nursing? Yes Does the patient require coordinated care of a physician, rehab nurse, PT, OT to address physical and functional deficits in the context of the above medical diagnosis(es)?  Yes Addressing deficits in the following areas: balance, endurance, locomotion, strength, transferring, bowel/bladder control, bathing, dressing, feeding, grooming, toileting, and psychosocial support Can the patient actively participate in an intensive therapy program of at least 3 hrs of therapy 5 days a week? Yes The potential for patient to make measurable gains while on inpatient rehab is excellent Anticipated functional outcomes upon discharge from inpatient rehab: supervision PT, supervision OT, supervision SLP Estimated rehab length of stay to reach the above functional goals is: 8-12 days Anticipated discharge destination: Home 10. Overall Rehab/Functional Prognosis: excellent   MD Signature: Laverle Postin, MD

## 2024-01-14 NOTE — Progress Notes (Signed)
 Mark Redhead, MD  Physician Physical Medicine and Rehabilitation   PMR Pre-admission    Signed   Date of Service: 01/14/2024  8:55 AM  Related encounter: ED to Hosp-Admission (Current) from 01/09/2024 in [facility] 1C MEDICAL TELEMETRY   Signed     Expand All Collapse All  Show:Clear all [x] Written[x] Templated[x] Copied  Added by: [x] Dorenda Gandy, RN[x] Raulkar, Keven Pel, MD  [] Hover for details PMR Admission Coordinator Pre-Admission Assessment   Patient: Mark Mcintosh is an 53 y.o., male MRN: [id number] DOB: 1970-12-06 Height: 5\' 11"  (180.3 cm) Weight: 117.9 kg   Insurance Information HMO:     PPO: yes     PCP:      IPA:      80/20:      OTHER:  PRIMARY: BCBS Comm PPO      Policy#: [id number]      Subscriber: pt CM Name: Mark Mcintosh      Phone#: [phone] option [phone] ext [phone]     Fax#: [phone] Pre-Cert#: [id number] approved 5/28 until 6/5      Employer: [employer], [employer] Active, [place] BCBS Benefits:  Phone #: [phone]     Name: 5/27 Eff. Date: 08/20/23     Deduct: $3300      Out of Pocket Max: $6550      Life Max: none CIR: 100%      SNF: 100% Outpatient: 100%     Co-Pay: 60 visits combined Home Health: 100%      Co-Pay:  DME: 100%     Co-Pay:  Providers: in network  SECONDARY: none   Financial Counselor:       Phone#:    The Data processing manager" for patients in Inpatient Rehabilitation Facilities with attached "Privacy Act Statement-Health Care Records" was provided and verbally reviewed with: Patient   Emergency Contact Information Contact Information       Name Relation Home Work Mobile    Mark Mcintosh Mother     [phone]         Other Contacts       Name Relation Home Work Mobile    Mark Mcintosh Friend     [phone]         Current Medical History  Patient Admitting Diagnosis: CVA   History of Present Illness: Mark Mcintosh is a 53 year old right-handed  male with history of bilateral conjunctival inflammation followed by ophthalmology services Dr. Ignatius Makos, hypertension as well as tobacco use and class II morbid obesity with BMI 36.26.  Per chart review patient lives alone.  1 level home one-step to enter.  Independent prior to admission.  Presented to [facility] 01/09/2024 with acute onset of left-sided weakness.     MRI showed acute infarcts in the posterior limb of the right internal capsule, the overlying right frontal white matter, and pons.  Multiple remote infarcts and chronic microvascular ischemic disease.  CTA showed no large vessel occlusion.  Admission chemistries unremarkable except potassium 3.1, hemoglobin A1c 5.7.  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  TEE  5/28 with small PFO. Neurology felt not likely to benefit from any kind of PFO closure evaluation.  Neurology follow-up placed on aspirin and Plavix for CVA prophylaxis x 3 weeks then aspirin alone.  Lovenox for DVT prophylaxis and bilateral lower extremity Dopplers negative.    Complete NIHSS TOTAL: 6   Patient's medical record from [facility] has been reviewed by the rehabilitation admission coordinator and physician.   Past Medical History  Past Medical History:  Diagnosis Date   Arthritis     Hypertension          Has the patient had major surgery during 100 days prior to admission? No   Family History   family history is not on file.   Current Medications  Current Medications    Current Facility-Administered Medications:    [MAR Hold] acetaminophen  (TYLENOL ) tablet 650 mg, 650 mg, Oral, Q4H PRN **OR** [MAR Hold] acetaminophen  (TYLENOL ) 160 MG/5ML solution 650 mg, 650 mg, Per Tube, Q4H PRN **OR** [MAR Hold] acetaminophen  (TYLENOL ) suppository 650 mg, 650 mg, Rectal, Q4H PRN, Lanetta Pion, MD   [MAR Hold] amLODipine  (NORVASC ) tablet 10 mg, 10 mg, Oral, Daily, Ayiku, Bernard, MD   [MAR Hold] aspirin EC tablet 81 mg, 81 mg, Oral, Daily, Duncan, Hazel V, MD, 81 mg  at 01/13/24 1410   [MAR Hold] atorvastatin (LIPITOR) tablet 40 mg, 40 mg, Oral, Daily, Eleni Griffin, MD, 40 mg at 01/13/24 1410   [MAR Hold] clopidogrel (PLAVIX) tablet 75 mg, 75 mg, Oral, Daily, Brion Cancel V, MD, 75 mg at 01/13/24 1410   [MAR Hold] enoxaparin (LOVENOX) injection 60 mg, 0.5 mg/kg, Subcutaneous, Q24H, Lanetta Pion, MD, 60 mg at 01/13/24 2112   [facility] Hold] hydrALAZINE (APRESOLINE) tablet 25 mg, 25 mg, Oral, Q6H PRN, Ayiku, Bernard, MD   [MAR Hold] nicotine (NICODERM CQ - dosed in mg/24 hours) patch 21 mg, 21 mg, Transdermal, Daily, Brion Cancel V, MD, 21 mg at 01/14/24 1044   [MAR Hold] prednisoLONE  acetate (PRED FORTE ) 1 % ophthalmic suspension 1 drop, 1 drop, Both Eyes, QID, Sheril Dines, MD, 1 drop at 01/13/24 2103   [facility] Hold] sodium chloride  flush (NS) 0.9 % injection 3-10 mL, 3-10 mL, Intravenous, Q12H, Callwood, Dwayne D, MD, 10 mL at 01/14/24 1044   [MAR Hold] sodium chloride  flush (NS) 0.9 % injection 3-10 mL, 3-10 mL, Intravenous, PRN, Callwood, Dwayne D, MD     Patients Current Diet:  Diet Order                  Diet NPO time specified  Diet effective midnight             Diet - low sodium heart healthy                       Precautions / Restrictions Precautions Precautions: Fall Restrictions Weight Bearing Restrictions Per Provider Order: No    Has the patient had 2 or more falls or a fall with injury in the past year? No   Prior Activity Level Community (5-7x/wk): independent, working and driving   Prior Functional Level Self Care: Did the patient need help bathing, dressing, using the toilet or eating? Independent   Indoor Mobility: Did the patient need assistance with walking from room to room (with or without device)? Independent   Stairs: Did the patient need assistance with internal or external stairs (with or without device)? Independent   Functional Cognition: Did the patient need help planning regular tasks such as shopping  or remembering to take medications? Independent   Patient Information Are you of Hispanic, Latino/a,or Spanish origin?: A. No, not of Hispanic, Latino/a, or Spanish origin What is your race?: B. Black or African American Do you need or want an interpreter to communicate with a doctor or health care staff?: 0. No   Patient's Response To:  Health Literacy and Transportation Is the patient able to respond to health literacy  and transportation needs?: Yes Health Literacy - How often do you need to have someone help you when you read instructions, pamphlets, or other written material from your doctor or pharmacy?: Never In the past 12 months, has lack of transportation kept you from medical appointments or from getting medications?: No In the past 12 months, has lack of transportation kept you from meetings, work, or from getting things needed for daily living?: No   Home Assistive Devices / Equipment Home Equipment: None   Prior Device Use: Indicate devices/aids used by the patient prior to current illness, exacerbation or injury? None of the above   Current Functional Level Cognition   Arousal/Alertness: Awake/alert Overall Cognitive Status: Within Functional Limits for tasks assessed (family not present to verify baseline) Orientation Level: Oriented X4 Attention: Sustained Sustained Attention: Appears intact Awareness: Appears intact Problem Solving: Appears intact Safety/Judgment: Appears intact    Extremity Assessment (includes Sensation/Coordination)   Upper Extremity Assessment: Right hand dominant, LUE deficits/detail RUE Deficits / Details: Reported numbess in finger tips LUE Deficits / Details: L UE grossly 4-/5, mild drift/inattention with bimanual tasks; denies sensory deficit LUE Sensation: WNL LUE Coordination: decreased fine motor, decreased gross motor  Lower Extremity Assessment: Defer to PT evaluation LLE Deficits / Details: grossly 4+/5; denies sensory deficit      ADLs   Overall ADL's : Needs assistance/impaired Grooming: Oral care, Standing, Minimal assistance Lower Body Dressing: Supervision/safety, Sit to/from stand Lower Body Dressing Details (indicate cue type and reason): Donning/doffing bilateral socks while seated on EOB Toilet Transfer: Ambulation, Contact guard assist Toilet Transfer Details (indicate cue type and reason): Simulated toilet t/f Functional mobility during ADLs: Contact guard assist General ADL Comments: Completes seated ADL tasks with no physical assistance, standing grooming tasks anticipate CGA for safety     Mobility   Overal bed mobility: Needs Assistance Bed Mobility: Supine to Sit, Sit to Supine Supine to sit: Supervision Sit to supine: Supervision General bed mobility comments: no physical assistance however a little impulsive. Vcs to slow down and for improved technique     Transfers   Overall transfer level: Needs assistance Equipment used: Rolling walker (2 wheels), 1 person hand held assist, None Transfers: Sit to/from Stand Sit to Stand: Contact guard assist, Min assist General transfer comment: CGA to stand from EOB to RW, min assist from lowest bed height to standing without UE support. continued vcs for slowing down     Ambulation / Gait / Stairs / Wheelchair Mobility   Ambulation/Gait Ambulation/Gait assistance: Contact guard assist, Min assist, Mod assist Gait Distance (Feet): 160 Feet Assistive device: Rolling walker (2 wheels), 1 person hand held assist, None Gait Pattern/deviations: Decreased dorsiflexion - left, Decreased stride length General Gait Details: pt ambulated 2 x 160 ft. 1 x 160 with RW + acewrap applied to L hand to prevent hand from sliding off RW. Min assist throughout with ambulation with +1 HHA, mod assist to ambulate without AD. pt remains high fall risk. Throughout ambulation with AD and without, Vcs for L side awareness and improved foot clearance on LLE. Once fatigued, pt tends  to slide LLE with poor clearance. Pt does have active dorsiflexion but tends to have flat foot gait during stance phase. Gait velocity: decreased     Posture / Balance Dynamic Sitting Balance Sitting balance - Comments: one occasion of LOB towards L while seated EOB Balance Overall balance assessment: Needs assistance Sitting-balance support: No upper extremity supported, Feet supported Sitting balance-Leahy Scale: Good Sitting balance -  Comments: one occasion of LOB towards L while seated EOB Standing balance support: No upper extremity supported, During functional activity, Reliant on assistive device for balance Standing balance-Leahy Scale: Poor Standing balance comment: Pt is at high risk of falls without UE support during dynamic standing activity Standardized Balance Assessment Standardized Balance Assessment : Berg Balance Test Berg Balance Test Sit to Stand: Able to stand without using hands and stabilize independently Standing Unsupported: Able to stand safely 2 minutes Sitting with Back Unsupported but Feet Supported on Floor or Stool: Able to sit safely and securely 2 minutes Stand to Sit: Controls descent by using hands Transfers: Able to transfer safely, definite need of hands Standing Unsupported with Eyes Closed: Able to stand 10 seconds with supervision Standing Ubsupported with Feet Together: Able to place feet together independently but unable to hold for 30 seconds From Standing, Reach Forward with Outstretched Arm: Reaches forward but needs supervision From Standing Position, Pick up Object from Floor: Able to pick up shoe, needs supervision From Standing Position, Turn to Look Behind Over each Shoulder: Looks behind one side only/other side shows less weight shift Turn 360 Degrees: Needs close supervision or verbal cueing Standing Unsupported, Alternately Place Feet on Step/Stool: Able to complete >2 steps/needs minimal assist Standing Unsupported, One Foot in Front:  Needs help to step but can hold 15 seconds Standing on One Leg: Tries to lift leg/unable to hold 3 seconds but remains standing independently Total Score: 34     Special needs/care consideration Does not have PCP    Previous Home Environment  Living Arrangements: Alone  Lives With: Alone Available Help at Discharge: Friend(s), Available PRN/intermittently Type of Home: House Home Layout: One level Home Access: Stairs to enter [employer] of Steps: 1 Bathroom Shower/Tub: Engineer, manufacturing systems: Standard Bathroom Accessibility: Yes How Accessible: Accessible via walker Home Care Services: No Additional Comments: works 2nd shift. No PCP, friend, Casandra Claw, assist prn   Discharge Living Setting Plans for Discharge Living Setting: Patient's home, Alone, House Type of Home at Discharge: House Discharge Home Layout: One level Discharge Home Access: Stairs to enter Entrance Stairs-Rails: None Entrance Stairs-Number of Steps: 1 Discharge Bathroom Shower/Tub: Tub/shower unit Discharge Bathroom Toilet: Standard Discharge Bathroom Accessibility: Yes How Accessible: Accessible via walker Does the patient have any problems obtaining your medications?: No   Social/Family/Support Systems Contact Information: friend, Market researcher, primary, not his Mom Anticipated Caregiver: friend prn only Anticipated Caregiver's Contact Information: see contacts Ability/Limitations of Caregiver: prn only Caregiver Availability: Intermittent Discharge Plan Discussed with Primary Caregiver: Yes Is Caregiver In Agreement with Plan?: Yes Does Caregiver/Family have Issues with Lodging/Transportation while Pt is in Rehab?: No   Goals Patient/Family Goal for Rehab: Mod I with PT and OT Expected length of stay: ELOS 7 to 10 days Additional Information: Has no PCP. Recently ran out of meds when out of town left meds at home Pt/Family Agrees to Admission and willing to participate:  Yes Program Orientation Provided & Reviewed with Pt/Caregiver Including Roles  & Responsibilities: Yes   Decrease burden of Care through IP rehab admission: n/a   Possible need for SNF placement upon discharge: not anticipated   Patient Condition: I have reviewed medical records from [facility], spoken with  patient. I discussed via phone for inpatient rehabilitation assessment.  Patient will benefit from ongoing PT and OT, can actively participate in 3 hours of therapy a day 5 days of the week, and can make measurable gains during the admission.  Patient will also  benefit from the coordinated team approach during an Inpatient Acute Rehabilitation admission.  The patient will receive intensive therapy as well as Rehabilitation physician, nursing, social worker, and care management interventions.  Due to bladder management, bowel management, safety, skin/wound care, disease management, medication administration, pain management, and patient education the patient requires 24 hour a day rehabilitation nursing.  The patient is currently min to mod assist overall with mobility and basic ADLs.  Discharge setting and therapy post discharge at home with home health is anticipated.  Patient has agreed to participate in the Acute Inpatient Rehabilitation Program and will admit today.   Preadmission Screen Completed By:  Tanya Fantasia RN MSN 01/14/2024 1:23 PM ______________________________________________________________________   Discussed status with Dr. Alessandra Ancona on 01/14/24 at 1323 and received approval for admission today.   Admission Coordinator:  Tanya Fantasia, RN MSN time 1323 Date 01/14/24    Assessment/Plan: Diagnosis: Acute CVA Does the need for close, 24 hr/day Medical supervision in concert with the patient's rehab needs make it unreasonable for this patient to be served in a less intensive setting? Yes Co-Morbidities requiring supervision/potential complications:  Class 2 obesity:  provide dietary education Constipation: provide dietary education, consider stool softener HTN: continue amlodipine  HLD: continue statin Active smoker: continue nicotine patch Due to bladder management, bowel management, safety, skin/wound care, disease management, medication administration, pain management, and patient education, does the patient require 24 hr/day rehab nursing? Yes Does the patient require coordinated care of a physician, rehab nurse, PT, OT to address physical and functional deficits in the context of the above medical diagnosis(es)? Yes Addressing deficits in the following areas: balance, endurance, locomotion, strength, transferring, bowel/bladder control, bathing, dressing, feeding, grooming, toileting, and psychosocial support Can the patient actively participate in an intensive therapy program of at least 3 hrs of therapy 5 days a week? Yes The potential for patient to make measurable gains while on inpatient rehab is excellent Anticipated functional outcomes upon discharge from inpatient rehab: supervision PT, supervision OT, supervision SLP Estimated rehab length of stay to reach the above functional goals is: 8-12 days Anticipated discharge destination: Home 10. Overall Rehab/Functional Prognosis: excellent     MD Signature: Laverle Postin, MD         Revision History

## 2024-01-14 NOTE — Transfer of Care (Signed)
 Immediate Anesthesia Transfer of Care Note  Patient: Mark Mcintosh  Procedure(s) Performed: ECHOCARDIOGRAM, TRANSESOPHAGEAL  Patient Location: PACU  Anesthesia Type:MAC  Level of Consciousness: awake  Airway & Oxygen Therapy: Patient Spontanous Breathing and Patient connected to nasal cannula oxygen  Post-op Assessment: Report given to RN and Post -op Vital signs reviewed and stable  Post vital signs: Reviewed and stable  Last Vitals:  Vitals Value Taken Time  BP 131/95 01/14/24 1245  Temp    Pulse 96 01/14/24 1246  Resp 16 01/14/24 1246  SpO2 95 % 01/14/24 1246    Last Pain:  Vitals:   01/14/24 1058  TempSrc: Oral  PainSc: 0-No pain      Patients Stated Pain Goal: 0 (01/14/24 0645)  Complications: No notable events documented.

## 2024-01-14 NOTE — Progress Notes (Signed)
 Inpatient Rehabilitation Admissions Coordinator   I spoke with patient by phone to review estimated cost of care for CIR. I await [place] approval for possible Cir admit. He is in agreement. TEE pending today.  Jeannetta Millman, RN, MSN Rehab Admissions Coordinator [phone] 01/14/2024 10:49 AM

## 2024-01-14 NOTE — Plan of Care (Signed)
 Problem: Education: Goal: Knowledge of disease or condition will improve 01/14/2024 0541 by Coreen Devoid, RN Outcome: Progressing 01/13/2024 2340 by Coreen Devoid, RN Outcome: Progressing Goal: Knowledge of secondary prevention will improve (MUST DOCUMENT ALL) 01/14/2024 0541 by Coreen Devoid, RN Outcome: Progressing 01/13/2024 2340 by Coreen Devoid, RN Outcome: Progressing Goal: Knowledge of patient specific risk factors will improve (DELETE if not current risk factor) 01/14/2024 0541 by Coreen Devoid, RN Outcome: Progressing 01/13/2024 2340 by Coreen Devoid, RN Outcome: Progressing   Problem: Ischemic Stroke/TIA Tissue Perfusion: Goal: Complications of ischemic stroke/TIA will be minimized 01/14/2024 0541 by Coreen Devoid, RN Outcome: Progressing 01/13/2024 2340 by Coreen Devoid, RN Outcome: Progressing   Problem: Coping: Goal: Will verbalize positive feelings about self 01/14/2024 0541 by Coreen Devoid, RN Outcome: Progressing 01/13/2024 2340 by Coreen Devoid, RN Outcome: Progressing Goal: Will identify appropriate support needs 01/14/2024 0541 by Coreen Devoid, RN Outcome: Progressing 01/13/2024 2340 by Coreen Devoid, RN Outcome: Progressing   Problem: Health Behavior/Discharge Planning: Goal: Ability to manage health-related needs will improve 01/14/2024 0541 by Coreen Devoid, RN Outcome: Progressing 01/13/2024 2340 by Coreen Devoid, RN Outcome: Progressing Goal: Goals will be collaboratively established with patient/family 01/14/2024 0541 by Coreen Devoid, RN Outcome: Progressing 01/13/2024 2340 by Coreen Devoid, RN Outcome: Progressing   Problem: Self-Care: Goal: Ability to participate in self-care as condition permits will improve 01/14/2024 0541 by Coreen Devoid, RN Outcome: Progressing 01/13/2024 2340 by Coreen Devoid, RN Outcome:  Progressing Goal: Verbalization of feelings and concerns over difficulty with self-care will improve 01/14/2024 0541 by Coreen Devoid, RN Outcome: Progressing 01/13/2024 2340 by Coreen Devoid, RN Outcome: Progressing Goal: Ability to communicate needs accurately will improve 01/14/2024 0541 by Coreen Devoid, RN Outcome: Progressing 01/13/2024 2340 by Coreen Devoid, RN Outcome: Progressing   Problem: Nutrition: Goal: Risk of aspiration will decrease 01/14/2024 0541 by Coreen Devoid, RN Outcome: Progressing 01/13/2024 2340 by Coreen Devoid, RN Outcome: Progressing Goal: Dietary intake will improve 01/14/2024 0541 by Coreen Devoid, RN Outcome: Progressing 01/13/2024 2340 by Coreen Devoid, RN Outcome: Progressing   Problem: Education: Goal: Knowledge of General Education information will improve Description: Including pain rating scale, medication(s)/side effects and non-pharmacologic comfort measures 01/14/2024 0541 by Coreen Devoid, RN Outcome: Progressing 01/13/2024 2340 by Coreen Devoid, RN Outcome: Progressing   Problem: Health Behavior/Discharge Planning: Goal: Ability to manage health-related needs will improve 01/14/2024 0541 by Coreen Devoid, RN Outcome: Progressing 01/13/2024 2340 by Coreen Devoid, RN Outcome: Progressing   Problem: Clinical Measurements: Goal: Ability to maintain clinical measurements within normal limits will improve 01/14/2024 0541 by Coreen Devoid, RN Outcome: Progressing 01/13/2024 2340 by Coreen Devoid, RN Outcome: Progressing Goal: Will remain free from infection 01/14/2024 0541 by Coreen Devoid, RN Outcome: Progressing 01/13/2024 2340 by Coreen Devoid, RN Outcome: Progressing Goal: Diagnostic test results will improve 01/14/2024 0541 by Coreen Devoid, RN Outcome: Progressing 01/13/2024 2340 by Coreen Devoid, RN Outcome:  Progressing Goal: Respiratory complications will improve 01/14/2024 0541 by Coreen Devoid, RN Outcome: Progressing 01/13/2024 2340 by Coreen Devoid, RN Outcome: Progressing Goal: Cardiovascular complication will be avoided 01/14/2024 0541 by Coreen Devoid, RN Outcome: Progressing 01/13/2024 2340 by Coreen Devoid, RN Outcome: Progressing   Problem: Activity: Goal: Risk for activity intolerance will decrease 01/14/2024 0541 by Coreen Devoid, RN Outcome: Progressing 01/13/2024 2340 by  Vivi Grit T, RN Outcome: Progressing   Problem: Nutrition: Goal: Adequate nutrition will be maintained 01/14/2024 0541 by Coreen Devoid, RN Outcome: Progressing 01/13/2024 2340 by Coreen Devoid, RN Outcome: Progressing   Problem: Coping: Goal: Level of anxiety will decrease 01/14/2024 0541 by Coreen Devoid, RN Outcome: Progressing 01/13/2024 2340 by Coreen Devoid, RN Outcome: Progressing   Problem: Elimination: Goal: Will not experience complications related to bowel motility 01/14/2024 0541 by Coreen Devoid, RN Outcome: Progressing 01/13/2024 2340 by Coreen Devoid, RN Outcome: Progressing Goal: Will not experience complications related to urinary retention 01/14/2024 0541 by Coreen Devoid, RN Outcome: Progressing 01/13/2024 2340 by Coreen Devoid, RN Outcome: Progressing   Problem: Pain Managment: Goal: General experience of comfort will improve and/or be controlled 01/14/2024 0541 by Coreen Devoid, RN Outcome: Progressing 01/13/2024 2340 by Coreen Devoid, RN Outcome: Progressing   Problem: Safety: Goal: Ability to remain free from injury will improve 01/14/2024 0541 by Coreen Devoid, RN Outcome: Progressing 01/13/2024 2340 by Coreen Devoid, RN Outcome: Progressing   Problem: Skin Integrity: Goal: Risk for impaired skin integrity will decrease 01/14/2024 0541 by  Coreen Devoid, RN Outcome: Progressing 01/13/2024 2340 by Coreen Devoid, RN Outcome: Progressing

## 2024-01-14 NOTE — Progress Notes (Addendum)
 Chart review follow-up note I was signed out to follow-up on the TEE for this patient that took a few days to complete.  Patient seen in initial consultation in follow-up by Dr. Doretta Gant. I received a secure chat from the cardiology team that the TEE has been completed patient has a small PFO-formal read pending. His lower extremity Dopplers are negative At this time,RoPE score is 4. Not likely to benefit from any kind of PFO closure evaluation.  Impression: Acute ischemic strokes-likely concomitant small vessel etiology given the risk factors.  Recommendations Continue dual antiplatelets for 3 weeks followed by aspirin 81 only. Atorvastatin 40 mg daily for goal LDL less than 70 Smoking cessation Medication compliance should be reiterated Neurology follow-up outpatient in 8 to 12 weeks No further inpatient neurological workup needed at this time Plan relayed to Dr. Ariel Begun Please call with questions as needed   -- Tona Francis, MD Neurologist [facility]

## 2024-01-15 ENCOUNTER — Encounter: Payer: Self-pay | Admitting: Cardiology

## 2024-01-15 DIAGNOSIS — I10 Essential (primary) hypertension: Secondary | ICD-10-CM | POA: Diagnosis not present

## 2024-01-15 DIAGNOSIS — I6350 Cerebral infarction due to unspecified occlusion or stenosis of unspecified cerebral artery: Secondary | ICD-10-CM

## 2024-01-15 DIAGNOSIS — E66812 Obesity, class 2: Secondary | ICD-10-CM

## 2024-01-15 DIAGNOSIS — R3915 Urgency of urination: Secondary | ICD-10-CM

## 2024-01-15 LAB — CBC
HCT: 44.2 % (ref 39.0–52.0)
Hemoglobin: 14.2 g/dL (ref 13.0–17.0)
MCH: 29.6 pg (ref 26.0–34.0)
MCHC: 32.1 g/dL (ref 30.0–36.0)
MCV: 92.3 fL (ref 80.0–100.0)
Platelets: 273 10*3/uL (ref 150–400)
RBC: 4.79 MIL/uL (ref 4.22–5.81)
RDW: 13.7 % (ref 11.5–15.5)
WBC: 7.3 10*3/uL (ref 4.0–10.5)
nRBC: 0 % (ref 0.0–0.2)

## 2024-01-15 LAB — CREATININE, SERUM
Creatinine, Ser: 0.94 mg/dL (ref 0.61–1.24)
GFR, Estimated: >60 mL/min (ref >60)

## 2024-01-15 LAB — VITAMIN D 25 HYDROXY (VIT D DEFICIENCY, FRACTURES): Vit D, 25-Hydroxy: 9.63 ng/mL — ABNORMAL LOW (ref 30–100)

## 2024-01-15 MED ORDER — ENOXAPARIN SODIUM 40 MG/0.4ML IJ SOSY
40.0000 mg | PREFILLED_SYRINGE | INTRAMUSCULAR | Status: DC
  Administered 2024-01-15 – 2024-01-20 (×6): 40 mg via SUBCUTANEOUS
  Filled 2024-01-15 (×7): qty 0.4

## 2024-01-15 NOTE — Progress Notes (Signed)
 Inpatient Rehabilitation  Patient information reviewed and entered into eRehab system by [facility]. Karen Kays., CCC/SLP, PPS Coordinator.  Information including medical coding, functional ability and quality indicators will be reviewed and updated through discharge.

## 2024-01-15 NOTE — Plan of Care (Signed)
  Problem: RH BOWEL ELIMINATION Goal: RH STG MANAGE BOWEL WITH ASSISTANCE Description: STG Manage Bowel with mod I Assistance. Outcome: Progressing   Problem: RH SKIN INTEGRITY Goal: RH STG SKIN FREE OF INFECTION/BREAKDOWN Description: Manage skin free of infection/breakdown with mod I assistance Outcome: Progressing   Problem: RH PAIN MANAGEMENT Goal: RH STG PAIN MANAGED AT OR BELOW PT'S PAIN GOAL Description: <4 w/ prns Outcome: Progressing

## 2024-01-15 NOTE — Progress Notes (Signed)
 Inpatient Rehabilitation Admission Medication Review by a Pharmacist  A complete drug regimen review was completed for this patient to identify any potential clinically significant medication issues.  High Risk Drug Classes Is patient taking? Indication by Medication  Antipsychotic No   Anticoagulant Yes Lovenox - VTE ppx  Antibiotic No   Opioid No   Antiplatelet Yes Aspirin, Clopidogrel x 21 days then aspirin alone- CVA  Hypoglycemics/insulin No   Vasoactive Medication Yes Amlodipine  - BP Irbesartan - BP  Chemotherapy No   Other Yes Atorvastatin - HLD Prednisolone  - Eye inflammation     Type of Medication Issue Identified Description of Issue Recommendation(s)  Drug Interaction(s) (clinically significant)     Duplicate Therapy     Allergy     No Medication Administration End Date     Incorrect Dose     Additional Drug Therapy Needed     Significant med changes from prior encounter (inform family/care partners about these prior to discharge).    Other       Clinically significant medication issues were identified that warrant physician communication and completion of prescribed/recommended actions by midnight of the next day:  No  Name of provider notified for urgent issues identified:   Provider Method of Notification:     Pharmacist comments: None  Time spent performing this drug regimen review (minutes):  20 minutes  Thank you. Lennice Quivers, PharmD

## 2024-01-15 NOTE — Evaluation (Signed)
 Occupational Therapy Assessment and Plan  Patient Details  Name: Mark Mcintosh MRN: [id number] Date of Birth: [birth year]  OT Diagnosis: hemiplegia affecting non-dominant side and muscle weakness (generalized) Rehab Potential: Rehab Potential (ACUTE ONLY): Good ELOS: 2 weeks   Today's Date: 01/15/2024 OT Individual Time: 1046-1200 OT Individual Time Calculation (min): 74 min     Hospital Problem: Principal Problem:   Right pontine cerebrovascular accident [facility])   Past Medical History:  Past Medical History:  Diagnosis Date   Arthritis    Hypertension    Past Surgical History:  Past Surgical History:  Procedure Laterality Date   ABDOMINAL SURGERY     TEE WITHOUT CARDIOVERSION N/A 01/14/2024   Procedure: ECHOCARDIOGRAM, TRANSESOPHAGEAL;  Surgeon: Alluri, Odessa Bene, MD;  Location: [facility] ORS;  Service: Cardiovascular;  Laterality: N/A;    Assessment & Plan Clinical Impression: Mark Mcintosh is a 53 year old right-handed male with history of bilateral conjunctival inflammation followed by ophthalmology services Dr. Ignatius Makos, hypertension as well as tobacco use and class II morbid obesity with BMI 36.26. Per chart review patient lives alone. 1 level home one-step to enter. Independent prior to admission. Presented to [facility] 01/09/2024 with acute onset of left-sided weakness. MRI showed acute infarcts in the posterior limb of the right internal capsule, the overlying right frontal white matter, and pons. Multiple remote infarcts and chronic microvascular ischemic disease. CTA showed no large vessel occlusion. Admission chemistries unremarkable except potassium 3.1, hemoglobin A1c 5.7. 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 TEE with small PFO per cardiology services Dr Bob Burn and no plan for closure. Neurology follow-up placed on aspirin and Plavix for CVA prophylaxis x 3 weeks then aspirin alone. Lovenox for DVT prophylaxis and bilateral lower extremity Dopplers negative. Therapy  evaluations completed due to patient decreased functional ability left-sided weakness was admitted for a comprehensive rehab program.  Patient transferred to CIR on 01/14/2024 .    Patient currently requires min-mod with basic self-care skills and IADL secondary to muscle weakness, decreased cardiorespiratoy endurance, impaired timing and sequencing, unbalanced muscle activation, decreased coordination, and decreased motor planning, decreased attention to left and decreased motor planning, decreased safety awareness, central origin, and decreased sitting balance, decreased standing balance, decreased postural control, hemiplegia, and decreased balance strategies.  Prior to hospitalization, patient could complete BADLs, IADLs, and drive with independent .  Patient will benefit from skilled intervention to decrease level of assist with basic self-care skills, increase independence with basic self-care skills, and increase level of independence with iADL prior to discharge home with care partner.  Anticipate patient will require intermittent supervision and follow up outpatient.  OT - End of Session Activity Tolerance: Decreased this session Endurance Deficit: Yes OT Assessment Rehab Potential (ACUTE ONLY): Good OT Patient demonstrates impairments in the following area(s): Balance;Behavior;Cognition;Endurance;Motor;Perception;Safety OT Basic ADL's Functional Problem(s): Grooming;Bathing;Dressing;Toileting OT Advanced ADL's Functional Problem(s): Simple Meal Preparation OT Transfers Functional Problem(s): Toilet;Tub/Shower OT Additional Impairment(s): Fuctional Use of Upper Extremity OT Plan OT Intensity: Minimum of 1-2 x/day, 45 to 90 minutes OT Frequency: 5 out of 7 days OT Duration/Estimated Length of Stay: 2 weeks OT Treatment/Interventions: Balance/vestibular training;Functional electrical stimulation;Self Care/advanced ADL retraining;UE/LE Coordination activities;Cognitive  remediation/compensation;Functional mobility training;Skin care/wound managment;Visual/perceptual remediation/compensation;Community reintegration;Neuromuscular re-education;Splinting/orthotics;Wheelchair propulsion/positioning;Discharge planning;Pain management;Therapeutic Activities;Disease mangement/prevention;Patient/family education;Therapeutic Exercise;DME/adaptive equipment instruction;Psychosocial support;UE/LE Strength taining/ROM OT Self Feeding Anticipated Outcome(s): Mod I OT Basic Self-Care Anticipated Outcome(s): Sup-CGA OT Toileting Anticipated Outcome(s): CGA OT Bathroom Transfers Anticipated Outcome(s): CGA OT Recommendation Recommendations for Other Services: Therapeutic Recreation consult Therapeutic Recreation Interventions: Stress management;Pet therapy Patient destination:  Home Follow Up Recommendations: Outpatient OT Equipment Recommended: To be determined   OT Evaluation Precautions/Restrictions  Precautions Precautions: Fall Recall of Precautions/Restrictions: Impaired Precaution/Restrictions Comments: L hemi (UE > LE) Restrictions Weight Bearing Restrictions Per Provider Order: No General Chart Reviewed: Yes Additional Pertinent History: HTN Family/Caregiver Present: No Vital Signs   Pain Pain Assessment Pain Scale: 0-10 Pain Score: 0-No pain Home Living/Prior Functioning Home Living Family/patient expects to be discharged to:: Private residence Living Arrangements: Alone Available Help at Discharge: Friend(s), Available PRN/intermittently Type of Home: House Home Access: Stairs to enter [employer] of Steps: 1 Entrance Stairs-Rails: None Home Layout: One level Bathroom Shower/Tub: Engineer, manufacturing systems: Standard Bathroom Accessibility: Yes Additional Comments: works 2nd shift. No PCP, friend, Casandra Claw, assist prn  Lives With: Alone IADL History Homemaking Responsibilities: Yes Meal Prep Responsibility: Primary Laundry  Responsibility: Primary Cleaning Responsibility: Primary Bill Paying/Finance Responsibility: Primary Shopping Responsibility: Primary Current License: Yes Mode of Transportation: Car Occupation: Full time employment Prior Function Level of Independence: Independent with basic ADLs, Independent with transfers, Independent with gait  Able to Take Stairs?: Yes Driving: Yes Vocation: Full time employment Vision Baseline Vision/History: 1 Wears glasses Ability to See in Adequate Light: 0 Adequate Patient Visual Report: No change from baseline Vision Assessment?: No apparent visual deficits Eye Alignment: Within Functional Limits Alignment/Gaze Preference: Within Defined Limits Tracking/Visual Pursuits: Able to track stimulus in all quads without difficulty Saccades: Within functional limits Perception  Perception: Impaired Perception-Other Comments: L side inattention Praxis Praxis: Impaired Praxis Impairment Details: Motor planning;Organization Cognition Cognition Overall Cognitive Status: Within Functional Limits for tasks assessed (intact other than safety awareness) Arousal/Alertness: Awake/alert Orientation Level: Person;Place;Situation Person: Oriented Place: Oriented Situation: Oriented Memory: Appears intact Attention: Sustained Sustained Attention: Appears intact Awareness: Appears intact Problem Solving: Appears intact Behaviors: Impulsive Safety/Judgment: Impaired Brief Interview for Mental Status (BIMS) Repetition of Three Words (First Attempt): 3 Temporal Orientation: Year: Correct Temporal Orientation: Month: Accurate within 5 days Temporal Orientation: Day: Correct Recall: "Sock": Yes, no cue required Recall: "Blue": Yes, no cue required Recall: "Bed": Yes, no cue required BIMS Summary Score: 15 Sensation Sensation Light Touch: Appears Intact Hot/Cold: Appears Intact Proprioception: Impaired by gross assessment Stereognosis: Impaired by gross  assessment Coordination Gross Motor Movements are Fluid and Coordinated: No Fine Motor Movements are Fluid and Coordinated: No Coordination and Movement Description: L hemi; quick to move with decreased awareness of motor deficits Finger Nose Finger Test: unable to complete on L UE, smooth equal movements on R Motor  Motor Motor: Hemiplegia Motor - Skilled Clinical Observations: L hemiplegia U>LE  Trunk/Postural Assessment  Cervical Assessment Cervical Assessment: Within Functional Limits Thoracic Assessment Thoracic Assessment: Exceptions to [facility] (rounded shoulders, L shoulder depression) Lumbar Assessment Lumbar Assessment: Within Functional Limits Postural Control Postural Control: Deficits on evaluation Trunk Control: posterior bias with L trunk rotation  Balance Balance Balance Assessed: Yes Static Sitting Balance Static Sitting - Balance Support: Feet supported Static Sitting - Level of Assistance: 5: Stand by assistance Dynamic Sitting Balance Dynamic Sitting - Balance Support: Feet unsupported Dynamic Sitting - Level of Assistance: 4: Min assist Static Standing Balance Static Standing - Balance Support: During functional activity;Right upper extremity supported Static Standing - Level of Assistance: 4: Min assist Dynamic Standing Balance Dynamic Standing - Balance Support: Left upper extremity supported;During functional activity Dynamic Standing - Level of Assistance: 4: Min assist Extremity/Trunk Assessment RUE Assessment RUE Assessment: Within Functional Limits LUE Assessment LUE Assessment: Exceptions to [facility] General Strength Comments: 3-/5 overall with maximal effort  and compensatory movements noted  Care Tool Care Tool Self Care Eating   Eating Assist Level: Set up assist    Oral Care    Oral Care Assist Level: Set up assist    Bathing   Body parts bathed by patient: Left arm;Chest;Abdomen;Front perineal area;Right upper leg;Left upper leg;Right lower  leg;Face;Left lower leg Body parts bathed by helper: Right arm;Buttocks   Assist Level: Minimal Assistance - Patient > 75%    Upper Body Dressing(including orthotics)   What is the patient wearing?: Pull over shirt   Assist Level: Moderate Assistance - Patient 50 - 74%    Lower Body Dressing (excluding footwear)   What is the patient wearing?: Underwear/pull up;Pants Assist for lower body dressing: Moderate Assistance - Patient 50 - 74%    Putting on/Taking off footwear   What is the patient wearing?: Socks Assist for footwear: Maximal Assistance - Patient 25 - 49%       Care Tool Toileting Toileting activity   Assist for toileting: Moderate Assistance - Patient 50 - 74%     Care Tool Bed Mobility Roll left and right activity   Roll left and right assist level: Minimal Assistance - Patient > 75%    Sit to lying activity   Sit to lying assist level: Minimal Assistance - Patient > 75%    Lying to sitting on side of bed activity   Lying to sitting on side of bed assist level: the ability to move from lying on the back to sitting on the side of the bed with no back support.: Minimal Assistance - Patient > 75%     Care Tool Transfers Sit to stand transfer   Sit to stand assist level: Moderate Assistance - Patient 50 - 74%    Chair/bed transfer   Chair/bed transfer assist level: Moderate Assistance - Patient 50 - 74%     Toilet transfer   Assist Level: Moderate Assistance - Patient 50 - 74%     Care Tool Cognition  Expression of Ideas and Wants Expression of Ideas and Wants: 4. Without difficulty (complex and basic) - expresses complex messages without difficulty and with speech that is clear and easy to understand  Understanding Verbal and Non-Verbal Content Understanding Verbal and Non-Verbal Content: 4. Understands (complex and basic) - clear comprehension without cues or repetitions   Memory/Recall Ability     Refer to Care Plan for Long Term Goals  SHORT TERM  GOAL WEEK 1 OT Short Term Goal 1 (Week 1): Pt will maintain L UE in safety position during functional tasks and transfers with min A OT Short Term Goal 2 (Week 1): Pt will recall hemi-dressing techniques with min questioning cues OT Short Term Goal 3 (Week 1): Pt will complete toilet transfers with min A using LRAD OT Short Term Goal 4 (Week 1): Pt will complete LB dressing min A using LRAD  Recommendations for other services: Therapeutic Recreation  Stress management   Skilled Therapeutic Intervention Skilled Therapeutic Interventions/Progress Updates:  1:1 OT evaluation and intervention initiated with skilled education provided on OT role, goals, and POC. Pt received sitting up in wc presenting to be in good spirits receptive to skilled OT session reporting 0/10 pain- OT offering intermittent rest breaks, repositioning, and therapeutic support to optimize participation in therapy session. Pt completed ADLs at levels listed below this session with education provided on hemi-techniques, DME use, functional transfers, and modified techniques. Pt mildly impulsive requiring consistent verbal cues for safety and pacing. Pt was  left resting in wc with call bell in reach, seatbelt alarm on, and all needs met.   ADL ADL Eating: Set up Where Assessed-Eating: Chair Grooming: Minimal assistance Where Assessed-Grooming: Sitting at sink Upper Body Bathing: Minimal assistance Where Assessed-Upper Body Bathing: Shower Lower Body Bathing: Minimal assistance Where Assessed-Lower Body Bathing: Shower Upper Body Dressing: Moderate assistance;Minimal assistance Where Assessed-Upper Body Dressing: Wheelchair Lower Body Dressing: Moderate assistance Where Assessed-Lower Body Dressing: Chair Toileting: Moderate assistance Where Assessed-Toileting: Teacher, adult education: Moderate assistance Toilet Transfer Method: Stand pivot Toilet Transfer Equipment: Raised toilet seat Tub/Shower Transfer: Not  assessed Film/video editor: Moderate assistance Film/video editor Method: Warden/ranger: Grab bars;Transfer tub bench Mobility  Bed Mobility Bed Mobility: Supine to Sit;Sit to Supine Supine to Sit: Minimal Assistance - Patient > 75% Sit to Supine: Minimal Assistance - Patient > 75% Transfers Sit to Stand: Moderate Assistance - Patient 50-74% Stand to Sit: Moderate Assistance - Patient 50-74%   Discharge Criteria: Patient will be discharged from OT if patient refuses treatment 3 consecutive times without medical reason, if treatment goals not met, if there is a change in medical status, if patient makes no progress towards goals or if patient is discharged from hospital.  The above assessment, treatment plan, treatment alternatives and goals were discussed and mutually agreed upon: by patient  Geoffery Kiel 01/15/2024, 12:02 PM

## 2024-01-15 NOTE — Progress Notes (Addendum)
 Physical Therapy Session Note  Patient Details  Name: Mark Mcintosh MRN: [id number] Date of Birth: Feb 06, 1971  Today's Date: 01/15/2024 PT Individual Time: 1411-1508 PT Individual Time Calculation (min): 57 min   Short Term Goals: Week 1:  PT Short Term Goal 1 (Week 1): Pt will complete bed mobility with CGA PT Short Term Goal 2 (Week 1): Pt will complete bed<>chair transfers minA with LRAD PT Short Term Goal 3 (Week 1): Pt will ambulate 147ft with minA and LRAD PT Short Term Goal 4 (Week 1): Pt will complete functional outcome measure to assess balance and falls risk  Skilled Therapeutic Interventions/Progress Updates: Patient sitting in WC on entrance to room. Patient alert and agreeable to PT session.   Patient reported some uncomfortability sitting in [facility] for a few hours (mobile activities out of chair provided).   Therapeutic Activity: Transfers: Pt performed sit<>stand transfers throughout session with RW (L saddle splint) and with overall minA (from WC<>edge of hi/low mat x 2, and one stand pivot minA L HHA). Provided VC for pivoting sequence/weight shift and hand placement. Pt with L lean with VC to self assess and correct weight back to center prior to transferring. Pt transported from room<>main gym in [facility] dependently for energy conservation.   Patient demonstrates increased fall risk as noted by score of 17/56 on Berg Balance Scale.  (<36= high risk for falls, close to 100%; 37-45 significant >80%; 46-51 moderate >50%; 52-55 lower >25%)'  - Pt educated on deficits, and informed on what future PT sessions with this PTA will consists of to progress pt towards LTG's and STG's with pt seemingly understanding.   Gait Training:  Pt ambulated 44' using RW with overall modA. Pt demonstrated the following gait deviations with therapist providing the described cuing and facilitation for improvement:  - Increased L lean - Decreased B weight shift with multimodal cues to shift  accordingly - Decreased hip flexion L LE with cues to bring L knee to front bar of RW (improved after following cue) - Narrow BOS initially and able to maintain neutral or greater after cues - inconsistent step length L LE with cues to avoid longer steps  Patient sitting in WC at end of session with brakes locked, belt alarm set, and all needs within reach.   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) Restrictions Weight Bearing Restrictions Per Provider Order: No   Therapy/Group: Individual Therapy  Mazi Brailsford PTA 01/15/2024, 3:23 PM

## 2024-01-15 NOTE — Plan of Care (Signed)
 Problem: RH Balance Goal: LTG: Patient will maintain dynamic sitting balance (OT) Description: LTG:  Patient will maintain dynamic sitting balance with assistance during activities of daily living (OT) Flowsheets (Taken 01/15/2024 1207) LTG: Pt will maintain dynamic sitting balance during ADLs with: Independent with assistive device Goal: LTG Patient will maintain dynamic standing with ADLs (OT) Description: LTG:  Patient will maintain dynamic standing balance with assist during activities of daily living (OT)  Flowsheets (Taken 01/15/2024 1207) LTG: Pt will maintain dynamic standing balance during ADLs with: Contact Guard/Touching assist   Problem: Sit to Stand Goal: LTG:  Patient will perform sit to stand in prep for activites of daily living with assistance level (OT) Description: LTG:  Patient will perform sit to stand in prep for activites of daily living with assistance level (OT) Flowsheets (Taken 01/15/2024 1207) LTG: PT will perform sit to stand in prep for activites of daily living with assistance level: Supervision/Verbal cueing   Problem: RH Grooming Goal: LTG Patient will perform grooming w/assist,cues/equip (OT) Description: LTG: Patient will perform grooming with assist, with/without cues using equipment (OT) Flowsheets (Taken 01/15/2024 1207) LTG: Pt will perform grooming with assistance level of: Supervision/Verbal cueing   Problem: RH Bathing Goal: LTG Patient will bathe all body parts with assist levels (OT) Description: LTG: Patient will bathe all body parts with assist levels (OT) Flowsheets (Taken 01/15/2024 1207) LTG: Pt will perform bathing with assistance level/cueing: Supervision/Verbal cueing LTG: Position pt will perform bathing: Shower   Problem: RH Dressing Goal: LTG Patient will perform upper body dressing (OT) Description: LTG Patient will perform upper body dressing with assist, with/without cues (OT). Flowsheets (Taken 01/15/2024 1207) LTG: Pt will  perform upper body dressing with assistance level of: Supervision/Verbal cueing Goal: LTG Patient will perform lower body dressing w/assist (OT) Description: LTG: Patient will perform lower body dressing with assist, with/without cues in positioning using equipment (OT) Flowsheets (Taken 01/15/2024 1207) LTG: Pt will perform lower body dressing with assistance level of: Contact Guard/Touching assist   Problem: RH Toileting Goal: LTG Patient will perform toileting task (3/3 steps) with assistance level (OT) Description: LTG: Patient will perform toileting task (3/3 steps) with assistance level (OT)  Flowsheets (Taken 01/15/2024 1207) LTG: Pt will perform toileting task (3/3 steps) with assistance level: Contact Guard/Touching assist   Problem: RH Functional Use of Upper Extremity Goal: LTG Patient will use RT/LT upper extremity as a (OT) Description: LTG: Patient will use right/left upper extremity as a stabilizer/gross assist/diminished/nondominant/dominant level with assist, with/without cues during functional activity (OT) Flowsheets (Taken 01/15/2024 1207) LTG: Use of upper extremity in functional activities: LUE as gross assist level LTG: Pt will use upper extremity in functional activity with assistance level of: Supervision/Verbal cueing   Problem: RH Simple Meal Prep Goal: LTG Patient will perform simple meal prep w/assist (OT) Description: LTG: Patient will perform simple meal prep with assistance, with/without cues (OT). Flowsheets (Taken 01/15/2024 1207) LTG: Pt will perform simple meal prep with assistance level of: Minimal Assistance - Patient > 75%   Problem: RH Toilet Transfers Goal: LTG Patient will perform toilet transfers w/assist (OT) Description: LTG: Patient will perform toilet transfers with assist, with/without cues using equipment (OT) Flowsheets (Taken 01/15/2024 1207) LTG: Pt will perform toilet transfers with assistance level of: Contact Guard/Touching assist    Problem: RH Tub/Shower Transfers Goal: LTG Patient will perform tub/shower transfers w/assist (OT) Description: LTG: Patient will perform tub/shower transfers with assist, with/without cues using equipment (OT) Flowsheets (Taken 01/15/2024 1207) LTG: Pt will perform  tub/shower stall transfers with assistance level of: Contact Guard/Touching assist LTG: Pt will perform tub/shower transfers from: Tub/shower combination

## 2024-01-15 NOTE — Plan of Care (Signed)
  Problem: RH Balance Goal: LTG Patient will maintain dynamic sitting balance (PT) Description: LTG:  Patient will maintain dynamic sitting balance with assistance during mobility activities (PT) Flowsheets (Taken 01/15/2024 1257) LTG: Pt will maintain dynamic sitting balance during mobility activities with:: Supervision/Verbal cueing Goal: LTG Patient will maintain dynamic standing balance (PT) Description: LTG:  Patient will maintain dynamic standing balance with assistance during mobility activities (PT) Flowsheets (Taken 01/15/2024 1257) LTG: Pt will maintain dynamic standing balance during mobility activities with:: Contact Guard/Touching assist   Problem: Sit to Stand Goal: LTG:  Patient will perform sit to stand with assistance level (PT) Description: LTG:  Patient will perform sit to stand with assistance level (PT) Flowsheets (Taken 01/15/2024 1257) LTG: PT will perform sit to stand in preparation for functional mobility with assistance level: Supervision/Verbal cueing   Problem: RH Bed Mobility Goal: LTG Patient will perform bed mobility with assist (PT) Description: LTG: Patient will perform bed mobility with assistance, with/without cues (PT). Flowsheets (Taken 01/15/2024 1257) LTG: Pt will perform bed mobility with assistance level of: Supervision/Verbal cueing   Problem: RH Bed to Chair Transfers Goal: LTG Patient will perform bed/chair transfers w/assist (PT) Description: LTG: Patient will perform bed to chair transfers with assistance (PT). Flowsheets (Taken 01/15/2024 1257) LTG: Pt will perform Bed to Chair Transfers with assistance level: Contact Guard/Touching assist   Problem: RH Car Transfers Goal: LTG Patient will perform car transfers with assist (PT) Description: LTG: Patient will perform car transfers with assistance (PT). Flowsheets (Taken 01/15/2024 1257) LTG: Pt will perform car transfers with assist:: Contact Guard/Touching assist   Problem: RH  Ambulation Goal: LTG Patient will ambulate in controlled environment (PT) Description: LTG: Patient will ambulate in a controlled environment, # of feet with assistance (PT). Flowsheets (Taken 01/15/2024 1257) LTG: Pt will ambulate in controlled environ  assist needed:: Contact Guard/Touching assist LTG: Ambulation distance in controlled environment: 181ft Goal: LTG Patient will ambulate in home environment (PT) Description: LTG: Patient will ambulate in home environment, # of feet with assistance (PT). Flowsheets (Taken 01/15/2024 1257) LTG: Pt will ambulate in home environ  assist needed:: Contact Guard/Touching assist LTG: Ambulation distance in home environment: 56ft   Problem: RH Stairs Goal: LTG Patient will ambulate up and down stairs w/assist (PT) Description: LTG: Patient will ambulate up and down # of stairs with assistance (PT) Flowsheets (Taken 01/15/2024 1257) LTG: Pt will ambulate up/down stairs assist needed:: Contact Guard/Touching assist LTG: Pt will  ambulate up and down number of stairs: at least 4 with 1 hand rail

## 2024-01-15 NOTE — Progress Notes (Signed)
 PROGRESS NOTE   Subjective/Complaints: Pt reports fall last night. Says he got up to bathroom by himself because nurse didn't respond to call, no urinal in bed. Pt denies any injury, no pain this morning fortunately. He does feel his bladder emptying is more urgent since stroke.  ROS: Patient denies fever, rash, sore throat, blurred vision, dizziness, nausea, vomiting, diarrhea, cough, shortness of breath or chest pain, joint or back/neck pain, headache, or mood change.    Objective:   ECHO TEE Result Date: 01/14/2024    TRANSESOPHOGEAL ECHO REPORT   Patient Name:   Mark Mcintosh Date of Exam: 01/14/2024 Medical Rec #:  [id number]         Height:       71.0 in Accession #:    [id number]        Weight:       260.0 lb Date of Birth:  January 19, 1971         BSA:          2.357 m Patient Age:    53 years          BP:           128/94 mmHg Patient Gender: M                 HR:           88 bpm. Exam Location:  [facility] Procedure: Transesophageal Echo, Cardiac Doppler, Color Doppler and Saline            Contrast Bubble Study (Both Spectral and Color Flow Doppler were            utilized during procedure). Indications:     Stroke  History:         Patient has prior history of Echocardiogram examinations, most                  recent 01/10/2024. Risk Factors:Hypertension.  Sonographer:     Broadus Canes Referring Phys:  [id number] DWAYNE D CALLWOOD Diagnosing Phys: Joetta Mustache PROCEDURE: After discussion of the risks and benefits of a TEE, an informed consent was obtained from the patient. The transesophogeal probe was passed without difficulty through the esophogus of the patient. Local oropharyngeal anesthetic was provided with Cetacaine. Sedation performed by different physician. The patient was monitored while under deep sedation. Image quality was excellent. The patient developed no complications during the procedure.  IMPRESSIONS  1. 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  2. Left ventricular ejection fraction, by estimation, is 55 to 60%. The left ventricle has normal function.  3. Right ventricular systolic function is normal. The right ventricular size is normal.  4. No left atrial/left atrial appendage thrombus was detected.  5. The mitral valve is normal in structure. Trivial mitral valve regurgitation.  6. The aortic valve is tricuspid. Aortic valve regurgitation is not visualized.  7. Agitated saline contrast bubble study was positive with shunting observed within 3-6 cardiac cycles suggestive of interatrial shunt. There is a small patent foramen ovale. FINDINGS  Left Ventricle: Left ventricular ejection fraction, by estimation, is 55 to 60%. The left ventricle has normal  function. The left ventricular internal cavity size was normal in size. Right Ventricle: The right ventricular size is normal. No increase in right ventricular wall thickness. Right ventricular systolic function is normal. Left Atrium: Left atrial size was normal in size. No left atrial/left atrial appendage thrombus was detected. Right Atrium: Right atrial size was normal in size. Pericardium: There is no evidence of pericardial effusion. Mitral Valve: The mitral valve is normal in structure. Trivial mitral valve regurgitation. Tricuspid Valve: The tricuspid valve is normal in structure. Tricuspid valve regurgitation is mild. Aortic Valve: The aortic valve is tricuspid. Aortic valve regurgitation is not visualized. Pulmonic Valve: The pulmonic valve was normal in structure. Pulmonic valve regurgitation is not visualized. Aorta: The aortic root is normal in size and structure. IAS/Shunts: The interatrial septum is aneurysmal. Agitated saline contrast was given intravenously to evaluate for intracardiac shunting. Agitated saline contrast bubble study was positive with shunting observed within 3-6 cardiac cycles suggestive of  interatrial shunt. A small patent foramen ovale is detected. Joetta Mustache Electronically signed by Joetta Mustache Signature Date/Time: 01/14/2024/1:35:26 PM    Final    Recent Labs    01/14/24 0239 01/15/24 0721  WBC 5.9 7.3  HGB 14.6 14.2  HCT 44.8 44.2  PLT 280 273   Recent Labs    01/13/24 0755 01/15/24 0721  NA 138  --   K 3.5  --   CL 102  --   CO2 26  --   GLUCOSE 100*  --   BUN 16  --   CREATININE 0.99 0.94  CALCIUM 9.0  --     Intake/Output Summary (Last 24 hours) at 01/15/2024 0850 Last data filed at 01/15/2024 0437 Gross per 24 hour  Intake --  Output 150 ml  Net -150 ml        Physical Exam: Vital Signs Blood pressure 122/74, pulse (!) 106, temperature 99.1 F (37.3 C), temperature source Oral, resp. rate 20, height 5\' 11"  (1.803 m), weight 98 kg, SpO2 94%.  General: Alert and oriented x 3, No apparent distress HEENT: Head is normocephalic, atraumatic, PERRLA, EOMI, sclera anicteric, oral mucosa pink and moist, dentition intact, ext ear canals clear,  Neck: Supple without JVD or lymphadenopathy Heart: Reg rate and rhythm. No murmurs rubs or gallops Chest: CTA bilaterally without wheezes, rales, or rhonchi; no distress Abdomen: Soft, non-tender, non-distended, bowel sounds positive. Extremities: No clubbing, cyanosis, or edema. Pulses are 2+ Psych: Pt's affect is appropriate. Pt is cooperative Skin: Clean and intact without signs of breakdown Neuro:  pt is alert and oriented. Fair insight and awareness. Seems to have functional memory. Normal language, significant dysarthria. Was pocketing some food in left side of mouth but attempting to clear it on his own. Left central VII and mild left tongue deviation. LUE is 2- to perhaps 2/5 prox to distal. LLE is 4- to 4/5 prox to distal. Sensory exam normal for light touch and pain in all 4 limbs. No limb ataxia or cerebellar signs. No abnormal tone appreciated.   Musculoskeletal: Full ROM, No pain with AROM or PROM  in the neck, trunk, or extremities. Posture appropriate     Assessment/Plan: 1. Functional deficits which require 3+ hours per day of interdisciplinary therapy in a comprehensive inpatient rehab setting. Physiatrist is providing close team supervision and 24 hour management of active medical problems listed below. Physiatrist and rehab team continue to assess barriers to discharge/monitor patient progress toward functional and medical goals  Care Tool:  Bathing  Bathing assist       Upper Body Dressing/Undressing Upper body dressing        Upper body assist      Lower Body Dressing/Undressing Lower body dressing            Lower body assist       Toileting Toileting    Toileting assist       Transfers Chair/bed transfer  Transfers assist           Locomotion Ambulation   Ambulation assist              Walk 10 feet activity   Assist           Walk 50 feet activity   Assist           Walk 150 feet activity   Assist           Walk 10 feet on uneven surface  activity   Assist           Wheelchair     Assist               Wheelchair 50 feet with 2 turns activity    Assist            Wheelchair 150 feet activity     Assist          Blood pressure 122/74, pulse (!) 106, temperature 99.1 F (37.3 C), temperature source Oral, resp. rate 20, height 5\' 11"  (1.803 m), weight 98 kg, SpO2 94%.  Medical Problem List and Plan: 1. Functional deficits secondary to right PLIC, frontal and pontine infarct with TEE showing small PFO no plan for closure or intervention             -patient may shower             -ELOS/Goals:  S 8-12 days             -Patient is beginning CIR therapies today including PT and OT. Needs SLP as well   2.  Impaired mobility -DVT/anticoagulation:  Pharmaceutical: Lovenox              -antiplatelet therapy: continue Aspirin  81 mg daily and Plavix  75 mg day  x 3 weeks then aspirin  alone   3. Pain Management: Tylenol  as needed   4. Mood/Behavior/Sleep: Provide emotional support             -antipsychotic agents: N/A   5. Neuropsych/cognition: This patient is capable of making decisions on his own behalf.   6. Skin/Wound Care: Routine skin checks 7. Fluids/Electrolytes/Nutrition: pt with good appetite  I personally reviewed the patient's labs today.    8.  Hypertension.  Continue Norvasc  10 mg daily.  Monitor with increased mobility.   - Added avapro  75mg  daily.   -5/29 good control this am   9.  Hyperlipidemia: continue Lipitor   10.  Class II morbid obesity.  BMI 36.26.  Dietary follow-up   11.  Tobacco abuse: continue NicoDerm patch.  Provide counseling   12.  Bilateral conjunctival inflammation.  Continue eyedrops.  Follow-up outpatient Dr.Brasington   13. Screening for vitamin D  deficiency:  vitamin D  level pending  14. Urinary urgency  -scan for pvr's bid  -pt has urinal at bedside, timed voids while awake  -encouraged him to ask for help,yell if he has to    LOS: 1 days A FACE TO FACE EVALUATION WAS PERFORMED  Rawland Caddy 01/15/2024, 8:50 AM

## 2024-01-15 NOTE — Evaluation (Signed)
 Physical Therapy Assessment and Plan  Patient Details  Name: Mark Mcintosh MRN: [id number] Date of Birth: 19-Jan-1971  PT Diagnosis: Abnormality of gait, Difficulty walking, Hemiplegia non-dominant, Impaired cognition, and Muscle weakness Rehab Potential: Good ELOS: 2 weeks   Today's Date: 01/15/2024 PT Individual Time: 0805-0900 PT Individual Time Calculation (min): 55 min    Hospital Problem: Principal Problem:   Right pontine cerebrovascular accident [facility])   Past Medical History:  Past Medical History:  Diagnosis Date   Arthritis    Hypertension    Past Surgical History:  Past Surgical History:  Procedure Laterality Date   ABDOMINAL SURGERY     TEE WITHOUT CARDIOVERSION N/A 01/14/2024   Procedure: ECHOCARDIOGRAM, TRANSESOPHAGEAL;  Surgeon: Alluri, Odessa Bene, MD;  Location: [facility] ORS;  Service: Cardiovascular;  Laterality: N/A;    Assessment & Plan Clinical Impression: Patient is a 53 year old right-handed male with history of bilateral conjunctival inflammation followed by ophthalmology services Dr. Ignatius Makos, hypertension as well as tobacco use and class II morbid obesity with BMI 36.26. Per chart review patient lives alone. 1 level home one-step to enter. Independent prior to admission. Presented to [facility] 01/09/2024 with acute onset of left-sided weakness. MRI showed acute infarcts in the posterior limb of the right internal capsule, the overlying right frontal white matter, and pons. Multiple remote infarcts and chronic microvascular ischemic disease. CTA showed no large vessel occlusion. Admission chemistries unremarkable except potassium 3.1, hemoglobin A1c 5.7. 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 TEE with small PFO per cardiology services Dr Bob Burn and no plan for closure. Neurology follow-up placed on aspirin and Plavix for CVA prophylaxis x 3 weeks then aspirin alone. Lovenox for DVT prophylaxis and bilateral lower extremity Dopplers negative. Therapy evaluations  completed due to patient decreased functional ability left-sided weakness was admitted for a comprehensive rehab program.  Patient transferred to CIR on 01/14/2024.   Patient currently requires mod with mobility secondary to muscle weakness and muscle joint tightness, decreased cardiorespiratoy endurance, unbalanced muscle activation and decreased coordination, decreased midline orientation and decreased attention to left, and decreased standing balance, decreased postural control, hemiplegia, and decreased balance strategies.  Prior to hospitalization, patient was independent  with mobility and lived with Alone in a House home.  Home access is 1Stairs to enter.  Patient will benefit from skilled PT intervention to maximize safe functional mobility, minimize fall risk, and decrease caregiver burden for planned discharge home with 24 hour assist.  Anticipate patient will benefit from follow up OP at discharge.  PT - End of Session Activity Tolerance: Tolerates 10 - 20 min activity with multiple rests Endurance Deficit: Yes PT Assessment Rehab Potential (ACUTE/IP ONLY): Good PT Barriers to Discharge: Decreased caregiver support;Home environment access/layout;Lack of/limited family support;Insurance for SNF coverage;Behavior;Incontinence PT Patient demonstrates impairments in the following area(s): Balance;Endurance;Motor;Perception;Safety PT Transfers Functional Problem(s): Bed Mobility;Bed to Chair;Car PT Locomotion Functional Problem(s): Ambulation;Wheelchair Mobility;Stairs PT Plan PT Intensity: Minimum of 1-2 x/day ,45 to 90 minutes PT Frequency: 5 out of 7 days PT Duration Estimated Length of Stay: 2 weeks PT Treatment/Interventions: Ambulation/gait training;Balance/vestibular training;Cognitive remediation/compensation;Community reintegration;Discharge planning;Disease management/prevention;DME/adaptive equipment instruction;Functional electrical stimulation;Functional mobility  training;Neuromuscular re-education;Pain management;Patient/family education;Skin care/wound management;Psychosocial support;Splinting/orthotics;Stair training;Therapeutic Exercise;Therapeutic Activities;UE/LE Strength taining/ROM;UE/LE Coordination activities;Wheelchair propulsion/positioning;Visual/perceptual remediation/compensation PT Transfers Anticipated Outcome(s): CGA PT Locomotion Anticipated Outcome(s): CGA PT Recommendation Recommendations for Other Services: Neuropsych consult Follow Up Recommendations: Home health PT;Outpatient PT;24 hour supervision/assistance Patient destination: Home Equipment Recommended: To be determined   PT Evaluation Precautions/Restrictions Precautions Precautions: Fall Recall of Precautions/Restrictions: Impaired Precaution/Restrictions Comments:  L hemi (UE > LE) Restrictions Weight Bearing Restrictions Per Provider Order: No Pain Interference Pain Interference Pain Effect on Sleep: 0. Does not apply - I have not had any pain or hurting in the past 5 days Pain Interference with Therapy Activities: 0. Does not apply - I have not received rehabilitationtherapy in the past 5 days Pain Interference with Day-to-Day Activities: 1. Rarely or not at all Home Living/Prior Functioning Home Living Available Help at Discharge: Friend(s);Available PRN/intermittently Type of Home: House Home Access: Stairs to enter [employer] of Steps: 1 Entrance Stairs-Rails: None Home Layout: One level Bathroom Shower/Tub: Engineer, manufacturing systems: Standard Bathroom Accessibility: Yes Additional Comments: works 2nd shift. No PCP, friend, Casandra Claw, assist prn  Lives With: Alone Prior Function Level of Independence: Independent with basic ADLs;Independent with transfers;Independent with gait  Able to Take Stairs?: Yes Driving: Yes Vocation: Full time employment Vision/Perception  Vision - History Ability to See in Adequate Light: 0  Adequate Vision - Assessment Eye Alignment: Within Functional Limits Alignment/Gaze Preference: Within Defined Limits Tracking/Visual Pursuits: Able to track stimulus in all quads without difficulty Saccades: Within functional limits Perception Perception: Impaired Preception Impairment Details: Inattention/Neglect Perception-Other Comments: L side inattention Praxis Praxis: Impaired Praxis Impairment Details: Motor planning;Organization  Cognition Overall Cognitive Status: Within Functional Limits for tasks assessed (intact other than safety awareness) Arousal/Alertness: Awake/alert Attention: Sustained Sustained Attention: Appears intact Memory: Appears intact Awareness: Appears intact Problem Solving: Appears intact Behaviors: Impulsive Safety/Judgment: Impaired Sensation Sensation Light Touch: Appears Intact Hot/Cold: Appears Intact Proprioception: Impaired by gross assessment Stereognosis: Impaired by gross assessment Coordination Gross Motor Movements are Fluid and Coordinated: No Fine Motor Movements are Fluid and Coordinated: No Coordination and Movement Description: L hemi; quick to move with decreased awareness of motor deficits Finger Nose Finger Test: unable to complete on L UE, smooth equal movements on R Motor  Motor Motor: Hemiplegia Motor - Skilled Clinical Observations: L hemiplegia U>LE   Trunk/Postural Assessment  Cervical Assessment Cervical Assessment: Within Functional Limits Thoracic Assessment Thoracic Assessment: Exceptions to [facility] (rounded shoulders, L shoulder depression) Lumbar Assessment Lumbar Assessment: Within Functional Limits Postural Control Postural Control: Deficits on evaluation Trunk Control: posterior bias with L trunk rotation  Balance Balance Balance Assessed: Yes Static Sitting Balance Static Sitting - Balance Support: Feet supported Static Sitting - Level of Assistance: 5: Stand by assistance Dynamic Sitting  Balance Dynamic Sitting - Balance Support: Feet unsupported Dynamic Sitting - Level of Assistance: 4: Min assist Static Standing Balance Static Standing - Balance Support: During functional activity;Right upper extremity supported Static Standing - Level of Assistance: 4: Min assist Dynamic Standing Balance Dynamic Standing - Balance Support: Left upper extremity supported;During functional activity Dynamic Standing - Level of Assistance: 4: Min assist Extremity Assessment  RUE Assessment RUE Assessment: Within Functional Limits LUE Assessment LUE Assessment: Exceptions to [facility] General Strength Comments: 3-/5 overall with maximal effort and compensatory movements noted      Care Tool Care Tool Bed Mobility Roll left and right activity   Roll left and right assist level: Minimal Assistance - Patient > 75%    Sit to lying activity   Sit to lying assist level: Minimal Assistance - Patient > 75%    Lying to sitting on side of bed activity   Lying to sitting on side of bed assist level: the ability to move from lying on the back to sitting on the side of the bed with no back support.: Minimal Assistance - Patient > 75%  Care Tool Transfers Sit to stand transfer   Sit to stand assist level: Moderate Assistance - Patient 50 - 74%    Chair/bed transfer   Chair/bed transfer assist level: Moderate Assistance - Patient 50 - 74%    Car transfer   Car transfer assist level: Moderate Assistance - Patient 50 - 74%      Care Tool Locomotion Ambulation   Assist level: Minimal Assistance - Patient > 75% Assistive device: Other (comment) (R hand rail) Max distance: 67ft  Walk 10 feet activity   Assist level: Minimal Assistance - Patient > 75% Assistive device: Other (comment) (R hand rail)   Walk 50 feet with 2 turns activity   Assist level: Minimal Assistance - Patient > 75% Assistive device: Other (comment) (R hand rail)  Walk 150 feet activity Walk 150 feet activity did not  occur: Safety/medical concerns      Walk 10 feet on uneven surfaces activity Walk 10 feet on uneven surfaces activity did not occur: Safety/medical concerns      Stairs   Assist level: Moderate Assistance - Patient - 50 - 74% Stairs assistive device: 1 hand rail Max number of stairs: 8 (3 inch)  Walk up/down 1 step activity   Walk up/down 1 step (curb) assist level: Moderate Assistance - Patient - 50 - 74% Walk up/down 1 step or curb assistive device: 1 hand rail  Walk up/down 4 steps activity   Walk up/down 4 steps assist level: Moderate Assistance - Patient - 50 - 74% Walk up/down 4 steps assistive device: 1 hand rail  Walk up/down 12 steps activity Walk up/down 12 steps activity did not occur: Safety/medical concerns      Pick up small objects from floor Pick up small object from the floor (from standing position) activity did not occur: Safety/medical concerns      Wheelchair Is the patient using a wheelchair?: Yes Type of Wheelchair: Manual   Wheelchair assist level: Dependent - Patient 0%    Wheel 50 feet with 2 turns activity   Assist Level: Dependent - Patient 0%  Wheel 150 feet activity   Assist Level: Dependent - Patient 0%    Refer to Care Plan for Long Term Goals  SHORT TERM GOAL WEEK 1 PT Short Term Goal 1 (Week 1): Pt will complete bed mobility with CGA PT Short Term Goal 2 (Week 1): Pt will complete bed<>chair transfers minA with LRAD PT Short Term Goal 3 (Week 1): Pt will ambulate 122ft with minA and LRAD PT Short Term Goal 4 (Week 1): Pt will complete functional outcome measure to assess balance and falls risk  Recommendations for other services: Neuropsych  Skilled Therapeutic Intervention Mobility Bed Mobility Bed Mobility: Supine to Sit;Sit to Supine Supine to Sit: Minimal Assistance - Patient > 75% Sit to Supine: Minimal Assistance - Patient > 75% Transfers Transfers: Sit to Stand;Stand Pivot Transfers;Stand to Sit Sit to Stand: Moderate  Assistance - Patient 50-74% Stand to Sit: Moderate Assistance - Patient 50-74% Stand Pivot Transfers: Moderate Assistance - Patient 50 - 74% Stand Pivot Transfer Details: Verbal cues for gait pattern;Verbal cues for technique;Verbal cues for precautions/safety;Verbal cues for safe use of DME/AE;Tactile cues for weight shifting;Verbal cues for sequencing;Manual facilitation for placement;Manual facilitation for weight shifting Transfer (Assistive device): Rolling walker Locomotion  Gait Ambulation: Yes Gait Assistance: Minimal Assistance - Patient > 75% Gait Distance (Feet): 75 Feet Assistive device: Other (Comment) (R hand rail) Gait Assistance Details: Verbal cues for precautions/safety;Verbal cues for safe use of  DME/AE;Verbal cues for gait pattern;Verbal cues for technique;Verbal cues for sequencing;Tactile cues for initiation;Tactile cues for weight shifting;Tactile cues for placement;Tactile cues for weight beaing;Manual facilitation for weight shifting Gait Gait: Yes Gait Pattern: Impaired Gait Pattern: Step-through pattern;Decreased stance time - right;Decreased stride length;Decreased dorsiflexion - left;Left flexed knee in stance;Poor foot clearance - left;Decreased trunk rotation;Lateral trunk lean to left;Trunk flexed Stairs / Additional Locomotion Stairs: Yes Stairs Assistance: Moderate Assistance - Patient 50 - 74%;Minimal Assistance - Patient > 75% Stair Management Technique: One rail Right;Step to pattern;Forwards Number of Stairs: 8 Height of Stairs: 3  Treatment: Pt presents in bed and in agreement to PT evaluation. Patient has no complaints of pain, oriented x4, dysarthric but understandable, and motivated to participate in therapy session. Functional mobility completed as outlined above. Patient requiring overall modA for functionally mobility. He presents with L sided weakness, L inattention, mildly impulsivity, decreased safety awareness, decreased insight into deficits,  and decreased standing balance. Pt reports new urinary incontinence since CVA - messaged team re: potential for timed toileting program. Pt ended session sitting up in wheelchair. Seat belt alarm on and needs met.   Discharge Criteria: Patient will be discharged from PT if patient refuses treatment 3 consecutive times without medical reason, if treatment goals not met, if there is a change in medical status, if patient makes no progress towards goals or if patient is discharged from hospital.  The above assessment, treatment plan, treatment alternatives and goals were discussed and mutually agreed upon: by patient  Londell River Eason Housman PT 01/15/2024, 12:59 PM

## 2024-01-16 DIAGNOSIS — I6350 Cerebral infarction due to unspecified occlusion or stenosis of unspecified cerebral artery: Secondary | ICD-10-CM | POA: Diagnosis not present

## 2024-01-16 DIAGNOSIS — R3915 Urgency of urination: Secondary | ICD-10-CM | POA: Diagnosis not present

## 2024-01-16 DIAGNOSIS — I10 Essential (primary) hypertension: Secondary | ICD-10-CM | POA: Diagnosis not present

## 2024-01-16 DIAGNOSIS — E66812 Obesity, class 2: Secondary | ICD-10-CM | POA: Diagnosis not present

## 2024-01-16 MED ORDER — SENNOSIDES-DOCUSATE SODIUM 8.6-50 MG PO TABS
2.0000 | ORAL_TABLET | Freq: Every day | ORAL | Status: DC
  Administered 2024-01-16: 2 via ORAL
  Filled 2024-01-16 (×2): qty 2

## 2024-01-16 MED ORDER — SORBITOL 70 % SOLN: 30.0000 mL | Freq: Every day | Status: DC | PRN

## 2024-01-16 MED ORDER — VITAMIN D 25 MCG (1000 UNIT) PO TABS
1000.0000 [IU] | ORAL_TABLET | Freq: Every day | ORAL | Status: DC
  Administered 2024-01-16 – 2024-01-30 (×15): 1000 [IU] via ORAL
  Filled 2024-01-16 (×15): qty 1

## 2024-01-16 MED ORDER — POLYETHYLENE GLYCOL 3350 17 G PO PACK: 17.0000 g | PACK | Freq: Every day | ORAL | Status: DC | PRN

## 2024-01-16 MED ORDER — BISACODYL 10 MG RE SUPP: 10.0000 mg | Freq: Every day | RECTAL | Status: DC | PRN

## 2024-01-16 MED ORDER — POLYETHYLENE GLYCOL 3350 17 G PO PACK
17.0000 g | PACK | Freq: Once | ORAL | Status: AC
  Administered 2024-01-16: 17 g via ORAL
  Filled 2024-01-16: qty 1

## 2024-01-16 NOTE — Progress Notes (Signed)
 Patient ID: Mark Mcintosh, male   DOB: 1971/03/23, 53 y.o.   MRN: [id number]  SW faxed FMLA forms to [employer] (p:[phone]/f:[phone]).   Norval Been, MSW, LCSW Office: [phone] Cell: [phone] Fax: [phone]

## 2024-01-16 NOTE — Progress Notes (Signed)
 Occupational Therapy Session Note  Patient Details  Name: Mark Mcintosh MRN: [id number] Date of Birth: 12/19/1970  Today's Date: 01/16/2024 OT Individual Time: 0920-1005 OT Individual Time Calculation (min): 45 min    Short Term Goals: Week 1:  OT Short Term Goal 1 (Week 1): Pt will maintain L UE in safety position during functional tasks and transfers with min A OT Short Term Goal 2 (Week 1): Pt will recall hemi-dressing techniques with min questioning cues OT Short Term Goal 3 (Week 1): Pt will complete toilet transfers with min A using LRAD OT Short Term Goal 4 (Week 1): Pt will complete LB dressing min A using LRAD  Skilled Therapeutic Interventions/Progress Updates:    Pt scheduled for 30 min of OT today but this therapist had extra time so pt agreeble to starting session early.  He was concerned about being constipated and eager to try to toilet.  Overall,  pt had more control with his mobility and transfers today and responded well to cues to slow down and wait for therapist to be in position to support him. Pt is developing active use of LUE and able to use 25% as a gross assist.   Pt was able to void on toilet and then transferred to shower.  See ADL documentation:  ADL Eating: Set up Where Assessed-Eating: Chair Grooming: Minimal assistance Where Assessed-Grooming: Sitting at sink Upper Body Bathing: Minimal assistance Where Assessed-Upper Body Bathing: Shower Lower Body Bathing: Minimal assistance Where Assessed-Lower Body Bathing: Shower Upper Body Dressing: Minimal assistance Where Assessed-Upper Body Dressing: Wheelchair Lower Body Dressing: Moderate assistance Where Assessed-Lower Body Dressing: Chair Toileting: Moderate assistance Where Assessed-Toileting: Teacher, adult education: Curator Method: Surveyor, minerals: Raised toilet seat Tub/Shower Transfer: Not assessed Film/video editor: Tour manager Method: Warden/ranger: Grab bars, Transfer tub bench  Pt resting in w/c and his NT came in to get him set up with belt alarm and all needs met.      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) Restrictions Weight Bearing Restrictions Per Provider Order: No   Pain: Pain Assessment Pain Scale: 0-10 Pain Score: 0-No pain ADL:    Therapy/Group: Individual Therapy  Travin Marik 01/16/2024, 12:22 PM

## 2024-01-16 NOTE — Plan of Care (Signed)
  Problem: RH BOWEL ELIMINATION Goal: RH STG MANAGE BOWEL WITH ASSISTANCE Description: STG Manage Bowel with mod I  Assistance. Outcome: Progressing   Problem: RH BLADDER ELIMINATION Goal: RH STG MANAGE BLADDER WITH ASSISTANCE Description: STG Manage Bladder With  mod I Assistance Outcome: Progressing   Problem: RH SKIN INTEGRITY Goal: RH STG SKIN FREE OF INFECTION/BREAKDOWN Description: Manage skin free of infection /breakdown with mod I assistance  Outcome: Progressing   Problem: RH SAFETY Goal: RH STG ADHERE TO SAFETY PRECAUTIONS W/ASSISTANCE/DEVICE Description: STG Adhere to Safety Precautions With  mod I Assistance/Device. Outcome: Progressing   Problem: RH PAIN MANAGEMENT Goal: RH STG PAIN MANAGED AT OR BELOW PT'S PAIN GOAL Description: <4 w/ prns Outcome: Progressing

## 2024-01-16 NOTE — Progress Notes (Signed)
 Inpatient Rehabilitation Care Coordinator Assessment and Plan Patient Details  Name: Mark Mcintosh MRN: [id number] Date of Birth: [birth year]  Today's Date: 01/16/2024  Hospital Problems: Principal Problem:   Right pontine cerebrovascular accident [facility])  Past Medical History:  Past Medical History:  Diagnosis Date   Arthritis    Hypertension    Past Surgical History:  Past Surgical History:  Procedure Laterality Date   ABDOMINAL SURGERY     TEE WITHOUT CARDIOVERSION N/A 01/14/2024   Procedure: ECHOCARDIOGRAM, TRANSESOPHAGEAL;  Surgeon: Alluri, Odessa Bene, MD;  Location: [facility] ORS;  Service: Cardiovascular;  Laterality: N/A;   Social History:  reports that he has been smoking cigarettes and cigars. He has never used smokeless tobacco. He reports that he does not currently use alcohol after a past usage of about 2.0 standard drinks of alcohol per week. He reports current drug use. Drug: Marijuana.  Family / Support Systems Marital Status: Single Patient Roles: Parent Spouse/Significant Other: N/A Children: Sons-Paul (28), Chris (32) PRN support; Son Joie Narrow lives in [place] Other Supports: friend Artist and mother PRN Anticipated Caregiver: PRN support only Ability/Limitations of Caregiver: Pt will only have PRN support from friend and mother Caregiver Availability: Intermittent Family Dynamics: Pt lives alone  Social History Preferred language: English Religion: Patient Refused Cultural Background: Pt has been working as a Research officer, trade union for 20 years. He is not a Cytogeneticist. Education: GED Health Literacy - How often do you need to have someone help you when you read instructions, pamphlets, or other written material from your doctor or pharmacy?: Never Writes: Yes Employment Status: Employed Return to Work Plans: TBD. Has FMLA and STD forms Legal History/Current Legal Issues: Denies Guardian/Conservator: Denies   Abuse/Neglect Abuse/Neglect Assessment Can Be Completed:  Yes Physical Abuse: Denies Verbal Abuse: Denies Sexual Abuse: Denies Exploitation of patient/patient's resources: Denies Self-Neglect: Denies  Patient response to: Social Isolation - How often do you feel lonely or isolated from those around you?: Never  Emotional Status Pt's affect, behavior and adjustment status: Pt in good spirits at time of visit Recent Psychosocial Issues: Denies Psychiatric History: Denies Substance Abuse History: Pt admits he smokes 1pk cigarettes daily and wearing patch now; admits to atleast one drink per week; rec drug use marijuana daily.  Patient / Family Perceptions, Expectations & Goals Pt/Family understanding of illness & functional limitations: Pt and family have a general understanding of care needs Premorbid pt/family roles/activities: Independent Anticipated changes in roles/activities/participation: Assistance with ADLs/IADLs Pt/family expectations/goals: pt goal is to work on getting strength back on his left side; and not get down on himself.  Community Resources [employer]: None Premorbid Home Care/DME Agencies: None Transportation available at discharge: TBD Is the patient able to respond to transportation needs?: Yes In the past 12 months, has lack of transportation kept you from medical appointments or from getting medications?: No In the past 12 months, has lack of transportation kept you from meetings, work, or from getting things needed for daily living?: No Resource referrals recommended: Neuropsychology  Discharge Planning Living Arrangements: Alone Support Systems: Manufacturing engineer, Parent Type of Residence: Private residence Insurance Resources: Media planner (specify) Herbalist) Financial Resources: Employment Financial Screen Referred: No Living Expenses: Psychologist, sport and exercise Management: Patient Does the patient have any problems obtaining your medications?: No Home Management: Pt manages all home care needs Patient/Family  Preliminary Plans: TBD Care Coordinator Barriers to Discharge: Decreased caregiver support, Lack of/limited family support, Insurance for SNF coverage Care Coordinator Anticipated Follow Up Needs: HH/OP  Clinical Impression SW met with pt  in room to introduce self, explain role, and discuss discharge process.pt called pt friend [place] while in room. Pt has FMLA and disability forms for employers. He is not a veteran No HCPOA. No DME. He will confirm with SW if he would like SW to assist with disability since he will be getting STD through employer; will confirm if they offer LTD as well.   Bernarr Longsworth A Tranell Wojtkiewicz 01/16/2024, 5:15 PM

## 2024-01-16 NOTE — Evaluation (Signed)
 Speech Language Pathology Assessment and Plan  Patient Details  Name: Mark Mcintosh MRN: [id number] Date of Birth: [birth year]  SLP Diagnosis: Dysphagia;Dysarthria  Rehab Potential: Excellent ELOS: 2 weeks    Today's Date: 01/16/2024 SLP Individual Time: 0800-0859 SLP Individual Time Calculation (min): 59 min   Hospital Problem: Principal Problem:   Right pontine cerebrovascular accident [facility])  Past Medical History:  Past Medical History:  Diagnosis Date   Arthritis    Hypertension    Past Surgical History:  Past Surgical History:  Procedure Laterality Date   ABDOMINAL SURGERY     TEE WITHOUT CARDIOVERSION N/A 01/14/2024   Procedure: ECHOCARDIOGRAM, TRANSESOPHAGEAL;  Surgeon: Alluri, Odessa Bene, MD;  Location: [facility] ORS;  Service: Cardiovascular;  Laterality: N/A;    Assessment / Plan / Recommendation Clinical Impression HPI: Jovontae Banko is a 53 year old right-handed male with history of bilateral conjunctival inflammation followed by ophthalmology services Dr. Ignatius Makos, hypertension as well as tobacco use and class II morbid obesity with BMI 36.26.  Per chart review patient lives alone.  1 level home one-step to enter.  Independent prior to admission.  Presented to [facility] 01/09/2024 with acute onset of left-sided weakness.     MRI showed acute infarcts in the posterior limb of the right internal capsule, the overlying right frontal white matter, and pons.  Multiple remote infarcts and chronic microvascular ischemic disease.  CTA showed no large vessel occlusion.  Admission chemistries unremarkable except potassium 3.1, hemoglobin A1c 5.7.  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  TEE  5/28 with small PFO. Neurology felt not likely to benefit from any kind of PFO closure evaluation.  Neurology follow-up placed on aspirin and Plavix for CVA prophylaxis x 3 weeks then aspirin alone.  Lovenox for DVT prophylaxis and bilateral lower extremity Dopplers negative.  Clinical  Impression:  Bedside Swallow Evaluation: A bedside swallow evaluation was completed to assess for s/sx of oropharyngeal dysphagia. Oral mechanism exam revealed L sided facial asymmetry and generalized oral (specifically lingual) weakness. Oral mechanism findings are consistent with patient reports of L pocketing and fatigue during mastication. POs administered included solids and thin liquids. Patient with mildly prolonged mastication and independent oral clearance with use of liquid wash and lingual sweep. No s/sx of aspiration present. Recommend regular/thin diet with use of standardized precautions including sitting upright during PO, taking small bites/sips at a slow rate and lingual/digital sweep. Recommend intermittent supervision during mealtimes.  Cognitive-linguistic: Patient was evaluated via the Cognistat to assess cognitive linguistic function. Patient scored WFL on all subtests. Patient with functional expressive and receptive language as well as sustained attention, awareness of deficits, simple problem solving and short term memory. Patient reports no changed in cognitive skills.  Dysarthria: Patient presents with mild dysarthria characterized by reduced respiratory support for speech and imprecise articulation. Patients maximum expiratory pressure was 73cm H2O with average for his age being 131cm H2O. Patient is approximately 85% intelligible at the conversational level. Patient would likely benefit from use of EMST (expiratory muscle strength training) and IOPI ([place]  Oral Performance Instrument). Pt would benefit from skilled ST services to maximize speech intelligibility and dysphagia in order to maximize functional independence at d/c.   Skilled Therapeutic Interventions          Patient evaluated using a standardized cognitive linguistic assessment and bedside swallow evaluation to assess current cognitive, communicative and swallowing function. See above for details.    SLP Assessment   Patient will need skilled Speech Lanaguage Pathology Services during CIR admission  Recommendations  SLP Diet Recommendations: Age appropriate regular solids;Thin Liquid Administration via: Straw;Cup Medication Administration: Whole meds with liquid Supervision: Patient able to self feed;Intermittent supervision to cue for compensatory strategies Compensations: Slow rate;Small sips/bites;Follow solids with liquid;Lingual sweep for clearance of pocketing Postural Changes and/or Swallow Maneuvers: Seated upright 90 degrees Oral Care Recommendations: Oral care BID Patient destination: Home Follow up Recommendations: Outpatient SLP Equipment Recommended: None recommended by SLP    SLP Frequency 1 to 3 out of 7 days   SLP Duration  SLP Intensity  SLP Treatment/Interventions 2 weeks  Minumum of 1-2 x/day, 30 to 90 minutes  Dysphagia/aspiration precaution training;Internal/external aids;Speech/Language facilitation;Therapeutic Activities;Functional tasks;Multimodal communication approach;Patient/family education    Pain None reported   SLP Evaluation Cognition Overall Cognitive Status: Within Functional Limits for tasks assessed Arousal/Alertness: Awake/alert Orientation Level: Oriented X4 Year: 2025 Month: May Day of Week: Correct Attention: Sustained Sustained Attention: Appears intact Memory: Appears intact Awareness: Appears intact Problem Solving: Appears intact  Comprehension Auditory Comprehension Overall Auditory Comprehension: Appears within functional limits for tasks assessed Expression Expression Primary Mode of Expression: Verbal Verbal Expression Overall Verbal Expression: Appears within functional limits for tasks assessed Oral Motor Oral Motor/Sensory Function Overall Oral Motor/Sensory Function: Moderate impairment Facial ROM: Reduced left Facial Symmetry: Abnormal symmetry left Facial Strength: Reduced left Lingual ROM: Within Functional  Limits Lingual Symmetry: Within Functional Limits Lingual Strength: Reduced Mandible: Within Functional Limits Motor Speech Overall Motor Speech: Impaired Respiration: Impaired Phonation: Normal Resonance: Within functional limits Articulation: Impaired Level of Impairment: Conversation Intelligibility: Intelligibility reduced Conversation: 75-100% accurate Motor Planning: Witnin functional limits Effective Techniques: Slow rate;Over-articulate  Care Tool Care Tool Cognition Ability to hear (with hearing aid or hearing appliances if normally used Ability to hear (with hearing aid or hearing appliances if normally used): 0. Adequate - no difficulty in normal conservation, social interaction, listening to TV   Expression of Ideas and Wants Expression of Ideas and Wants: 3. Some difficulty - exhibits some difficulty with expressing needs and ideas (e.g, some words or finishing thoughts) or speech is not clear   Understanding Verbal and Non-Verbal Content Understanding Verbal and Non-Verbal Content: 4. Understands (complex and basic) - clear comprehension without cues or repetitions  Memory/Recall Ability Memory/Recall Ability : Current season;Location of own room;Staff names and faces;That he or she is in a hospital/hospital unit   Bedside Swallowing Assessment General Diet Prior to this Study: Regular;Thin liquids (Level 0) Respiratory Status: Room air Behavior/Cognition: Alert;Cooperative Oral Cavity - Dentition: Missing dentition;Poor condition Self-Feeding Abilities: Able to feed self Vision: Functional for self-feeding Patient Positioning: Upright in bed Volitional Cough: Strong Volitional Swallow: Able to elicit  Ice Chips Ice chips: Not tested Thin Liquid Thin Liquid: Within functional limits Presentation: Self Fed;Straw Nectar Thick Nectar Thick Liquid: Not tested Honey Thick Honey Thick Liquid: Not tested Puree Puree: Not tested Solid Solid:  Impaired Presentation: Self Fed Oral Phase Impairments: Impaired mastication Oral Phase Functional Implications: Impaired mastication;Prolonged oral transit BSE Assessment Risk for Aspiration Impact on safety and function: Mild aspiration risk  Short Term Goals: Week 1: SLP Short Term Goal 1 (Week 1): Patient will utilize speech intelligibility strategies to reach 95% intelligibility at the conversational level given supervision verbal A SLP Short Term Goal 2 (Week 1): Patient will utilize safe swallowing strategies during consumption of least restrictive diet given supervision verbal A  Refer to Care Plan for Long Term Goals  Recommendations for other services: None   Discharge Criteria: Patient will be discharged from SLP if patient  refuses treatment 3 consecutive times without medical reason, if treatment goals not met, if there is a change in medical status, if patient makes no progress towards goals or if patient is discharged from hospital.  The above assessment, treatment plan, treatment alternatives and goals were discussed and mutually agreed upon: by patient  Antoinette Borgwardt M.A., CCC-SLP 01/16/2024, 9:32 AM

## 2024-01-16 NOTE — Plan of Care (Signed)
  Problem: RH Swallowing Goal: LTG Patient will consume least restrictive diet using compensatory strategies with assistance (SLP) Description: LTG:  Patient will consume least restrictive diet using compensatory strategies with assistance (SLP) Flowsheets (Taken 01/16/2024 0900) LTG: Pt Patient will consume least restrictive diet using compensatory strategies with assistance of (SLP): Modified Independent   Problem: RH Expression Communication Goal: LTG Patient will increase speech intelligibility (SLP) Description: LTG: Patient will increase speech intelligibility at word/phrase/conversation level with cues, % of the time (SLP) Flowsheets (Taken 01/16/2024 0900) LTG: Patient will increase speech intelligibility (SLP): Modified Independent Level: Conversation level Percent of time patient will use intelligible speech: 100

## 2024-01-16 NOTE — Group Note (Signed)
 Patient Details Name: Mark Mcintosh MRN: [id number] DOB: 1970/08/31 Today's Date: 01/16/2024  Time Calculation: OT Group Time Calculation OT Group Start Time: 1105 OT Group Stop Time: 1205 OT Group Time Calculation (min): 60 min      Group Description: Stress management: Pt participated in group session with a focus on stress mgmt, education provided on healthy coping strategies, and social interaction. Focus of session on providing coping strategies to manage new diagnosis to allow for improved mental health to increase overall quality of life . Discussed how to break down stressors into "daily hassles," "major life stressors" and "life circumstances" in an effort to allow pts to chunk their stressors into groups and determine where to best put their efforts/time when dealing with stress. Provided active listening, emotional support and therapeutic use of self. Offered education on factors that protect us  against stress such as "daily uplifts," "healthy coping strategies" and "protective factors." Encouraged all group members to make an effort to actively recall one event from their day that was a daily uplift in an effort to protect their mindset from stressors as well as sharing this information with their caregivers to facilitate improved caregiver communication and decrease overall burden of care.  Issued pt handouts on healthy coping strategies to implement into routine.   Individual level documentation: Patient participated with full collaboration during session.   Pain: Pain Assessment Pain Scale: 0-10 Pain Score: 0-No pain    [name] 01/16/2024, 12:27 PM

## 2024-01-16 NOTE — Care Management (Signed)
 Inpatient Rehabilitation Center Individual Statement of Services  Patient Name:  Mark Mcintosh  Date:  01/16/2024  Welcome to the Inpatient Rehabilitation Center.  Our goal is to provide you with an individualized program based on your diagnosis and situation, designed to meet your specific needs.  With this comprehensive rehabilitation program, you will be expected to participate in at least 3 hours of rehabilitation therapies Monday-Friday, with modified therapy programming on the weekends.  Your rehabilitation program will include the following services:  Physical Therapy (PT), Occupational Therapy (OT), Speech Therapy (ST), 24 hour per day rehabilitation nursing, Therapeutic Recreaction (TR), Psychology, Neuropsychology, Care Coordinator, Rehabilitation Medicine, Nutrition Services, Pharmacy Services, and Other  Weekly team conferences will be held on Wednesdays to discuss your progress.  Your Inpatient Rehabilitation Care Coordinator will talk with you frequently to get your input and to update you on team discussions.  Team conferences with you and your family in attendance may also be held.  Expected length of stay: 14 days    Overall anticipated outcome: Supervision  Depending on your progress and recovery, your program may change. Your Inpatient Rehabilitation Care Coordinator will coordinate services and will keep you informed of any changes. Your Inpatient Rehabilitation Care Coordinator's name and contact numbers are listed  below.  The following services may also be recommended but are not provided by the Inpatient Rehabilitation Center:  Driving Evaluations Home Health Rehabiltiation Services Outpatient Rehabilitation Services Vocational Rehabilitation   Arrangements will be made to provide these services after discharge if needed.  Arrangements include referral to agencies that provide these services.  Your insurance has been verified to be:  BC[employer]  Your primary doctor  is:  No PCP  Pertinent information will be shared with your doctor and your insurance company.  Inpatient Rehabilitation Care Coordinator:  Kathey Pang [phone] or [phone]  Information discussed with and copy given to patient by: Rennis Case, 01/16/2024, 1:40 PM

## 2024-01-16 NOTE — Progress Notes (Addendum)
 PROGRESS NOTE   Subjective/Complaints: Pt feeling well today. Still has urinary urgency which is new since stroke. Denies any pain today. No bm in a few days  ROS: Patient denies fever, rash, sore throat, blurred vision, dizziness, nausea, vomiting, diarrhea, cough, shortness of breath or chest pain, joint or back/neck pain, headache, or mood change.    Objective:   ECHO TEE Result Date: 01/14/2024    TRANSESOPHOGEAL ECHO REPORT   Patient Name:   Mark Mcintosh Date of Exam: 01/14/2024 Medical Rec #:  [id number]         Height:       71.0 in Accession #:    [id number]        Weight:       260.0 lb Date of Birth:  1971-04-03         BSA:          2.357 m Patient Age:    53 years          BP:           128/94 mmHg Patient Gender: M                 HR:           88 bpm. Exam Location:  [facility] Procedure: Transesophageal Echo, Cardiac Doppler, Color Doppler and Saline            Contrast Bubble Study (Both Spectral and Color Flow Doppler were            utilized during procedure). Indications:     Stroke  History:         Patient has prior history of Echocardiogram examinations, most                  recent 01/10/2024. Risk Factors:Hypertension.  Sonographer:     Broadus Canes Referring Phys:  [id number] DWAYNE D CALLWOOD Diagnosing Phys: Joetta Mustache PROCEDURE: After discussion of the risks and benefits of a TEE, an informed consent was obtained from the patient. The transesophogeal probe was passed without difficulty through the esophogus of the patient. Local oropharyngeal anesthetic was provided with Cetacaine. Sedation performed by different physician. The patient was monitored while under deep sedation. Image quality was excellent. The patient developed no complications during the procedure.  IMPRESSIONS  1. 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  2. Left ventricular ejection fraction, by  estimation, is 55 to 60%. The left ventricle has normal function.  3. Right ventricular systolic function is normal. The right ventricular size is normal.  4. No left atrial/left atrial appendage thrombus was detected.  5. The mitral valve is normal in structure. Trivial mitral valve regurgitation.  6. The aortic valve is tricuspid. Aortic valve regurgitation is not visualized.  7. Agitated saline contrast bubble study was positive with shunting observed within 3-6 cardiac cycles suggestive of interatrial shunt. There is a small patent foramen ovale. FINDINGS  Left Ventricle: Left ventricular ejection fraction, by estimation, is 55 to 60%. The left ventricle has normal function. The left ventricular internal cavity size was normal in size. Right Ventricle: The right ventricular size is normal. No  increase in right ventricular wall thickness. Right ventricular systolic function is normal. Left Atrium: Left atrial size was normal in size. No left atrial/left atrial appendage thrombus was detected. Right Atrium: Right atrial size was normal in size. Pericardium: There is no evidence of pericardial effusion. Mitral Valve: The mitral valve is normal in structure. Trivial mitral valve regurgitation. Tricuspid Valve: The tricuspid valve is normal in structure. Tricuspid valve regurgitation is mild. Aortic Valve: The aortic valve is tricuspid. Aortic valve regurgitation is not visualized. Pulmonic Valve: The pulmonic valve was normal in structure. Pulmonic valve regurgitation is not visualized. Aorta: The aortic root is normal in size and structure. IAS/Shunts: The interatrial septum is aneurysmal. Agitated saline contrast was given intravenously to evaluate for intracardiac shunting. Agitated saline contrast bubble study was positive with shunting observed within 3-6 cardiac cycles suggestive of interatrial shunt. A small patent foramen ovale is detected. Joetta Mustache Electronically signed by Joetta Mustache Signature  Date/Time: 01/14/2024/1:35:26 PM    Final    Recent Labs    01/14/24 0239 01/15/24 0721  WBC 5.9 7.3  HGB 14.6 14.2  HCT 44.8 44.2  PLT 280 273   Recent Labs    01/15/24 0721  CREATININE 0.94    Intake/Output Summary (Last 24 hours) at 01/16/2024 0940 Last data filed at 01/16/2024 0623 Gross per 24 hour  Intake --  Output 500 ml  Net -500 ml        Physical Exam: Vital Signs Blood pressure (!) 103/90, pulse 93, temperature 98.8 F (37.1 C), temperature source Oral, resp. rate 18, height 5\' 11"  (1.803 m), weight 98 kg, SpO2 100%.  Constitutional: No distress . Vital signs reviewed. HEENT: NCAT, EOMI, oral membranes moist Neck: supple Cardiovascular: RRR without murmur. No JVD    Respiratory/Chest: CTA Bilaterally without wheezes or rales. Normal effort    GI/Abdomen: BS +, non-tender, non-distended Ext: no clubbing, cyanosis, or edema Psych: pleasant and cooperative  Skin: Clean and intact without signs of breakdown Neuro:  pt is alert and oriented. Fair insight and awareness. Seems to have functional memory. Normal language, significant dysarthria. Left central VII and mild left tongue deviation. LUE remains 2- to  2/5 prox to distal. LLE is 4- to 4/5 prox to distal. Sensory exam normal for light touch and pain in all 4 limbs. No limb ataxia or cerebellar signs. No abnormal tone appreciated.   Musculoskeletal: Full ROM, No pain with AROM or PROM in the neck, trunk, or extremities. Posture appropriate    Assessment/Plan: 1. Functional deficits which require 3+ hours per day of interdisciplinary therapy in a comprehensive inpatient rehab setting. Physiatrist is providing close team supervision and 24 hour management of active medical problems listed below. Physiatrist and rehab team continue to assess barriers to discharge/monitor patient progress toward functional and medical goals  Care Tool:  Bathing    Body parts bathed by patient: Left arm, Chest, Abdomen, Front  perineal area, Right upper leg, Left upper leg, Right lower leg, Face, Left lower leg   Body parts bathed by helper: Right arm, Buttocks     Bathing assist Assist Level: Minimal Assistance - Patient > 75%     Upper Body Dressing/Undressing Upper body dressing   What is the patient wearing?: Pull over shirt    Upper body assist Assist Level: Moderate Assistance - Patient 50 - 74%    Lower Body Dressing/Undressing Lower body dressing      What is the patient wearing?: Underwear/pull up, Pants  Lower body assist Assist for lower body dressing: Moderate Assistance - Patient 50 - 74%     Toileting Toileting    Toileting assist Assist for toileting: Moderate Assistance - Patient 50 - 74%     Transfers Chair/bed transfer  Transfers assist     Chair/bed transfer assist level: Moderate Assistance - Patient 50 - 74%     Locomotion Ambulation   Ambulation assist      Assist level: Minimal Assistance - Patient > 75% Assistive device: Other (comment) (R hand rail) Max distance: 18ft   Walk 10 feet activity   Assist     Assist level: Minimal Assistance - Patient > 75% Assistive device: Other (comment) (R hand rail)   Walk 50 feet activity   Assist    Assist level: Minimal Assistance - Patient > 75% Assistive device: Other (comment) (R hand rail)    Walk 150 feet activity   Assist Walk 150 feet activity did not occur: Safety/medical concerns         Walk 10 feet on uneven surface  activity   Assist Walk 10 feet on uneven surfaces activity did not occur: Safety/medical concerns         Wheelchair     Assist Is the patient using a wheelchair?: Yes Type of Wheelchair: Manual    Wheelchair assist level: Dependent - Patient 0%      Wheelchair 50 feet with 2 turns activity    Assist        Assist Level: Dependent - Patient 0%   Wheelchair 150 feet activity     Assist      Assist Level: Dependent - Patient 0%    Blood pressure (!) 103/90, pulse 93, temperature 98.8 F (37.1 C), temperature source Oral, resp. rate 18, height 5\' 11"  (1.803 m), weight 98 kg, SpO2 100%.  Medical Problem List and Plan: 1. Functional deficits secondary to right PLIC, frontal and pontine infarct with TEE showing small PFO no plan for closure or intervention             -patient may shower             -ELOS/Goals:  S 8-12 days            -Continue CIR therapies including PT, OT, and SLP    2.  Impaired mobility -DVT/anticoagulation:  Pharmaceutical: Lovenox              -antiplatelet therapy: continue Aspirin  81 mg daily and Plavix  75 mg day x 3 weeks then aspirin  alone   3. Pain Management: Tylenol  as needed   4. Mood/Behavior/Sleep: Provide emotional support             -antipsychotic agents: N/A   5. Neuropsych/cognition: This patient is capable of making decisions on his own behalf.   6. Skin/Wound Care: Routine skin checks 7. Fluids/Electrolytes/Nutrition: pt reports good appetite  No po intake recorded yesterday   8.  Hypertension.  Continue Norvasc  10 mg daily.  Monitor with increased mobility.   - Added avapro  75mg  daily.   -5/30 improved control. Bp's trending down--obsv only today   9.  Hyperlipidemia: continue Lipitor   10.  Class II morbid obesity.  BMI 36.26.  Dietary follow-up   11.  Tobacco abuse: continue NicoDerm patch.  Provide counseling   12.  Bilateral conjunctival inflammation.  Continue eyedrops.  Follow-up outpatient Dr.Brasington   13. Screening for vitamin D  deficiency:  vitamin D  level 9  -5/30 begin vitamin D  1000 u  daily  14. Urinary urgency--persistent  -5/30 scan for pvr's bid---still not done---request again today  -recent ua negative. Pending pvr's consider myrbetriq  -pt has urinal at bedside, timed voids while awake  -encouraged him to ask for help,yell if he has to  15. Slow transit constipation  5/30-miralax today and daily prn  -senokot-s 2 tabs at  bedtime  -sorbitol prn    LOS: 2 days A FACE TO FACE EVALUATION WAS PERFORMED  Rawland Caddy 01/16/2024, 9:40 AM

## 2024-01-16 NOTE — Progress Notes (Signed)
 Physical Therapy Session Note  Patient Details  Name: Mark Mcintosh MRN: [id number] Date of Birth: 31-Mar-1971  Today's Date: 01/16/2024 PT Individual Time: 1308-1350 PT Individual Time Calculation (min): 42 min   Short Term Goals: Week 1:  PT Short Term Goal 1 (Week 1): Pt will complete bed mobility with CGA PT Short Term Goal 2 (Week 1): Pt will complete bed<>chair transfers minA with LRAD PT Short Term Goal 3 (Week 1): Pt will ambulate 116ft with minA and LRAD PT Short Term Goal 4 (Week 1): Pt will complete functional outcome measure to assess balance and falls risk  Skilled Therapeutic Interventions/Progress Updates: Patient sitting in WC on entrance to room. Patient alert and agreeable to PT session.   Patient reported no pain during session. Pt performed sit<>stands with RW with CGA/minA to donn L UE on hemi-grip (required VC for hand/foot placement) Pt transported to day room gym in [facility] and ambulated roughly 60' in RW with minA (few moments of closer to modA to prevent L LOB due to poor weight shift to R and attempting to advance L LE through swing - pt provided with hinted cues with pt recalling what PTA addressed during previous day session with demonstration provided of importance of shifting weight). Pt also cued to increase L hip flexion, and to perform step-through pattern vs step-to to improve WB/weight shift to L side. Pt provided with seated rest break and ambulated same distance with same assistance and improved weight shifting/step clearance. Pt sat in [facility] and expressed frustration/sadness to current presentation. Pt transported back to room for privacy and PTA provided active listening and encouragement. Pt participated in static standing balance without UE support by window in room (touching counter as needed) with CGA. Pt then cued to shift weight laterally, and to use ankle strategies to maintain positioning. Pt then cued to perform mini marches with R UE supported, and cues to  increase hip flexion on L LE with overall CGA.  Patient sitting in WC at end of session with brakes locked, chair alarm set, and all needs within reach.      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) Restrictions Weight Bearing Restrictions Per Provider Order: No  Therapy/Group: Individual Therapy  Monte Zinni PTA 01/16/2024, 4:08 PM

## 2024-01-16 NOTE — IPOC Note (Signed)
 Overall Plan of Care [facility]) Patient Details Name: Mark Mcintosh MRN: [id number] DOB: 09-10-1970  Admitting Diagnosis: Right pontine cerebrovascular accident [facility])  Hospital Problems: Principal Problem:   Right pontine cerebrovascular accident [facility])     Functional Problem List: Nursing Edema, Endurance, Medication Management, Pain, Safety  PT Balance, Endurance, Motor, Perception, Safety  OT Balance, Behavior, Cognition, Endurance, Motor, Perception, Safety  SLP Linguistic, Nutrition  TR         Basic ADL's: OT Grooming, Bathing, Dressing, Toileting     Advanced  ADL's: OT Simple Meal Preparation     Transfers: PT Bed Mobility, Bed to Chair, Car  OT Toilet, Tub/Shower     Locomotion: PT Ambulation, Psychologist, prison and probation services, Stairs     Additional Impairments: OT Fuctional Use of Upper Extremity  SLP Communication, Swallowing      TR      Anticipated Outcomes Item Anticipated Outcome  Self Feeding Mod I  Swallowing  mod i   Basic self-care  Sup-CGA  Toileting  CGA   Bathroom Transfers CGA  Bowel/Bladder  continent of bowel / bladder  Transfers  CGA  Locomotion  CGA  Communication  mod i  Cognition     Pain  <4 w/ prns  Safety/Judgment  manage safety with mod I assistance   Therapy Plan: PT Intensity: Minimum of 1-2 x/day ,45 to 90 minutes PT Frequency: 5 out of 7 days PT Duration Estimated Length of Stay: 2 weeks OT Intensity: Minimum of 1-2 x/day, 45 to 90 minutes OT Frequency: 5 out of 7 days OT Duration/Estimated Length of Stay: 2 weeks SLP Intensity: Minumum of 1-2 x/day, 30 to 90 minutes SLP Frequency: 1 to 3 out of 7 days SLP Duration/Estimated Length of Stay: 2 weeks   Team Interventions: Nursing Interventions Patient/Family Education, Bladder Management, Bowel Management, Disease Management/Prevention, Pain Management, Medication Management, Discharge Planning  PT interventions Ambulation/gait training, Balance/vestibular training,  Cognitive remediation/compensation, Community reintegration, Discharge planning, Disease management/prevention, DME/adaptive equipment instruction, Functional electrical stimulation, Functional mobility training, Neuromuscular re-education, Pain management, Patient/family education, Skin care/wound management, Psychosocial support, Splinting/orthotics, Stair training, Therapeutic Exercise, Therapeutic Activities, UE/LE Strength taining/ROM, UE/LE Coordination activities, Wheelchair propulsion/positioning, Visual/perceptual remediation/compensation  OT Interventions Balance/vestibular training, Functional electrical stimulation, Self Care/advanced ADL retraining, UE/LE Coordination activities, Cognitive remediation/compensation, Functional mobility training, Skin care/wound managment, Visual/perceptual remediation/compensation, Community reintegration, Neuromuscular re-education, Splinting/orthotics, Wheelchair propulsion/positioning, Discharge planning, Pain management, Therapeutic Activities, Disease mangement/prevention, Patient/family education, Therapeutic Exercise, DME/adaptive equipment instruction, Psychosocial support, UE/LE Strength taining/ROM  SLP Interventions Dysphagia/aspiration precaution training, Internal/external aids, Speech/Language facilitation, Therapeutic Activities, Functional tasks, Multimodal communication approach, Patient/family education  TR Interventions    SW/CM Interventions     Barriers to Discharge MD  Medical stability  Nursing Decreased caregiver support, Home environment access/layout Discharge: House  Discharge Home Layout: One level  Discharge Home Access: Stairs to enter  Entrance Stairs-Rails: None  Entrance Stairs-Number of Steps: 1  PT Decreased caregiver support, Home environment access/layout, Lack of/limited family support, Community education officer for SNF coverage, Behavior, Incontinence    OT      SLP      SW       Team Discharge Planning: Destination: PT-Home  ,OT- Home , SLP-Home Projected Follow-up: PT-Home health PT, Outpatient PT, 24 hour supervision/assistance, OT-  Outpatient OT, SLP-Outpatient SLP Projected Equipment Needs: PT-To be determined, OT- To be determined, SLP-None recommended by SLP Equipment Details: PT- , OT-  Patient/family involved in discharge planning: PT- Patient,  OT-Patient, SLP-Patient  MD ELOS: 2 weeks Medical Rehab Prognosis:  Excellent Assessment: The patient has been admitted for CIR therapies with the diagnosis of right pontine infarct with LUE>LLE weakness, dysarthria. The team will be addressing functional mobility, strength, stamina, balance, safety, adaptive techniques and equipment, self-care, bowel and bladder mgt, patient and caregiver education, NMR, speech, swallowing, cognition. Goals have been set at contact guard assist for mobility, self-care, speech, communication. Anticipated discharge destination is home.        See Team Conference Notes for weekly updates to the plan of care

## 2024-01-17 DIAGNOSIS — I6350 Cerebral infarction due to unspecified occlusion or stenosis of unspecified cerebral artery: Secondary | ICD-10-CM | POA: Diagnosis not present

## 2024-01-17 MED ORDER — MIRABEGRON ER 25 MG PO TB24: 25.0000 mg | ORAL_TABLET | Freq: Every day | ORAL | Status: DC

## 2024-01-17 MED ORDER — PREDNISOLONE ACETATE 1 % OP SUSP
1.0000 [drp] | Freq: Four times a day (QID) | OPHTHALMIC | Status: AC
  Administered 2024-01-18 – 2024-01-20 (×12): 1 [drp] via OPHTHALMIC
  Filled 2024-01-17: qty 5

## 2024-01-17 NOTE — Progress Notes (Signed)
 Physical Therapy Session Note  Patient Details  Name: Mark Mcintosh MRN: [id number] Date of Birth: 1970-09-13  Today's Date: 01/17/2024 PT Individual Time: 0805-0918 PT Individual Time Calculation (min): 73 min   Short Term Goals: Week 1:  PT Short Term Goal 1 (Week 1): Pt will complete bed mobility with CGA PT Short Term Goal 2 (Week 1): Pt will complete bed<>chair transfers minA with LRAD PT Short Term Goal 3 (Week 1): Pt will ambulate 148ft with minA and LRAD PT Short Term Goal 4 (Week 1): Pt will complete functional outcome measure to assess balance and falls risk  Skilled Therapeutic Interventions/Progress Updates: Patient sitting in WC on entrance to room. Patient alert and agreeable to PT session.   Patient reported soreness in R shoulder (lateral) due to having to pull self up in bed with R UE. PTA provided pt with green theraband donned on R top railing in bed for pt to perform pull downs to increase lat strength with demonstration provided.  Therapeutic Activity: Transfers: Pt performed sit<>stand transfers throughout session with RW and CGA. Pt self donned L hand on hemi-grip with supervision and cues to ensure standing balance is secured. Pt with improved pivot clearance and only required VC to increase step clearance when retro-stepping with L LE vs sliding.   Pt informed to perform SLR supine in bed and sitting 90/90's with L LE when not having any therapy (Sunday) or throughout the day in evening if not fatigued. Demonstration provided.   Gait Training:  - Pt ambulated roughly 44' following seated/ankle weight NMRE in RW with CGA/light minA and improved adherence to weight shift (improved step through pattern and clearance when cued to perform on L LE). Pt with decreased heel-strike to clear front of toe without excessive hip flexion (NMRE to follow). - Following heel-strike NMRE, pt ambulated roughly 10' with RW and CGA with improved heel-strike, step through pattern and  clearance on L LE. Pt with minimal L lean vs previous ambulatory trials!  Neuromuscular Re-ed: NMR facilitated during session with focus on neuromuscular connection/coordination, proprioceptive feedback, weight shifting. - Pt ambulated 70' x 2 with rest break provided using RW (4lb ankle weight donned L LE) with mostly CGA with brief moments of minA due to L lean when stepping with L LE and decreased ability to obtain clearance due to (pt self assessed and noticed when speeding up or not shifting to R). Pt with improved weight shift this session. Pt provided with cues to increase L hip flexion - "high knee" - sitting edge of mat with 4lb ankle weight donned L LE and instructions to move LE up and over orange cone. Pt progressed with PTA pulling LE in line with pt's motion to challenge eccentric coordination/control. Pt with seated rest break then performed same intervention, this time with PTA pulling LE in opposing direction. - Pt in RW with instructions to step up to 2" step with only L heel, and to step back behind R LE to improve heel strike and clearance while ambulating. Pt cued to maintain neutral BOS vs narrow. Pt with CGA throughout.   NMR performed for improvements in motor control and coordination, balance, sequencing, judgement, and self confidence/ efficacy in performing all aspects of mobility at highest level of independence.   Patient sitting in WC at end of session with brakes locked, chair alarm set, and all needs within reach.      Therapy Documentation Precautions:  Precautions Precautions: Fall Recall of Precautions/Restrictions: Impaired Precaution/Restrictions Comments: L hemi (  UE > LE) Restrictions Weight Bearing Restrictions Per Provider Order: No  Therapy/Group: Individual Therapy  Elisabella Hacker PTA 01/17/2024, 4:20 PM

## 2024-01-17 NOTE — Progress Notes (Signed)
 PROGRESS NOTE   Subjective/Complaints:  No Events overnight.  No acute complaints. Some temperatures up into the low 99's, but overall vitals are stable Eating 100% of meals PVRs 224 yesterday and 0 this a.m. patient endorses some urinary urgency, but no incontinence, dysuria, or abdominal pain. Had a bowel movement yesterday  ROS: Patient denies fever, rash, sore throat, blurred vision, dizziness, nausea, vomiting, diarrhea, cough, shortness of breath or chest pain, joint or back/neck pain, headache, or mood change.  + Urinary urgency  Objective:   No results found.  Recent Labs    01/15/24 0721  WBC 7.3  HGB 14.2  HCT 44.2  PLT 273   Recent Labs    01/15/24 0721  CREATININE 0.94    Intake/Output Summary (Last 24 hours) at 01/17/2024 1030 Last data filed at 01/17/2024 0838 Gross per 24 hour  Intake 1200 ml  Output 825 ml  Net 375 ml        Physical Exam: Vital Signs Blood pressure 135/89, pulse 78, temperature 98.9 F (37.2 C), temperature source Oral, resp. rate 18, height 5\' 11"  (1.803 m), weight 98 kg, SpO2 99%.  Constitutional: No distress . Vital signs reviewed.  Sitting upright in bedside chair. HEENT: NCAT, EOMI, oral membranes moist.  Mild facial droop. Neck: supple Cardiovascular: RRR without murmur. No JVD    Respiratory/Chest: CTA Bilaterally without wheezes or rales. Normal effort    GI/Abdomen: BS +, non-tender, non-distended Ext: no clubbing, cyanosis, or edema Psych: pleasant and cooperative  Skin: Clean and intact without signs of breakdown Neuro:  pt is alert and oriented x 3. Fair insight and awareness.  Mild to moderate memory deficits.   Normal language, significant dysarthria.  Left central VII and mild left tongue deviation.  LUE remains 2- to  2/5 prox to distal.  LLE is 4- to 4/5 prox to distal.  Sensory exam normal for light touch and pain in all 4 limbs.  No limb ataxia or  cerebellar signs.  No abnormal tone appreciated.    Musculoskeletal: Full ROM, No pain with AROM or PROM in the neck, trunk, or extremities. Posture appropriate   Physical exam unchanged from the above on reexamination 01/17/24    Assessment/Plan: 1. Functional deficits which require 3+ hours per day of interdisciplinary therapy in a comprehensive inpatient rehab setting. Physiatrist is providing close team supervision and 24 hour management of active medical problems listed below. Physiatrist and rehab team continue to assess barriers to discharge/monitor patient progress toward functional and medical goals  Care Tool:  Bathing    Body parts bathed by patient: Left arm, Chest, Abdomen, Front perineal area, Right upper leg, Left upper leg, Right lower leg, Face, Left lower leg   Body parts bathed by helper: Right arm, Buttocks     Bathing assist Assist Level: Minimal Assistance - Patient > 75%     Upper Body Dressing/Undressing Upper body dressing   What is the patient wearing?: Pull over shirt    Upper body assist Assist Level: Minimal Assistance - Patient > 75%    Lower Body Dressing/Undressing Lower body dressing      What is the patient wearing?: Underwear/pull up, Pants  Lower body assist Assist for lower body dressing: Moderate Assistance - Patient 50 - 74%     Toileting Toileting    Toileting assist Assist for toileting: Moderate Assistance - Patient 50 - 74%     Transfers Chair/bed transfer  Transfers assist     Chair/bed transfer assist level: Minimal Assistance - Patient > 75%     Locomotion Ambulation   Ambulation assist      Assist level: Minimal Assistance - Patient > 75% Assistive device: Other (comment) (R hand rail) Max distance: 81ft   Walk 10 feet activity   Assist     Assist level: Minimal Assistance - Patient > 75% Assistive device: Other (comment) (R hand rail)   Walk 50 feet activity   Assist    Assist level:  Minimal Assistance - Patient > 75% Assistive device: Other (comment) (R hand rail)    Walk 150 feet activity   Assist Walk 150 feet activity did not occur: Safety/medical concerns         Walk 10 feet on uneven surface  activity   Assist Walk 10 feet on uneven surfaces activity did not occur: Safety/medical concerns         Wheelchair     Assist Is the patient using a wheelchair?: Yes Type of Wheelchair: Manual    Wheelchair assist level: Dependent - Patient 0%      Wheelchair 50 feet with 2 turns activity    Assist        Assist Level: Dependent - Patient 0%   Wheelchair 150 feet activity     Assist      Assist Level: Dependent - Patient 0%   Blood pressure 135/89, pulse 78, temperature 98.9 F (37.2 C), temperature source Oral, resp. rate 18, height 5\' 11"  (1.803 m), weight 98 kg, SpO2 99%.  Medical Problem List and Plan: 1. Functional deficits secondary to right PLIC, frontal and pontine infarct with TEE showing small PFO no plan for closure or intervention             -patient may shower             -ELOS/Goals:  S 8-12 days            -Continue CIR therapies including PT, OT, and SLP    2.  Impaired mobility -DVT/anticoagulation:  Pharmaceutical: Lovenox              -antiplatelet therapy: continue Aspirin  81 mg daily and Plavix  75 mg day x 3 weeks then aspirin  alone   3. Pain Management: Tylenol  as needed   4. Mood/Behavior/Sleep: Provide emotional support             -antipsychotic agents: N/A   5. Neuropsych/cognition: This patient is capable of making decisions on his own behalf.   6. Skin/Wound Care: Routine skin checks 7. Fluids/Electrolytes/Nutrition: pt reports good appetite  -5-31: P.o. intakes 100% for most meals.  Appropriate.   8.  Hypertension.  Continue Norvasc  10 mg daily.  Monitor with increased mobility.    - Added avapro  75mg  daily.   -5/30 improved control. Bp's trending down--obsv only today   - 5-31:  Normotensive    01/17/2024    4:25 AM 01/16/2024    7:20 PM 01/16/2024    5:13 PM  Vitals with BMI  Systolic 135 110 161  Diastolic 89 94 80  Pulse 78 89 83    9.  Hyperlipidemia: continue Lipitor   10.  Class II morbid obesity.  BMI  36.26.  Dietary follow-up   11.  Tobacco abuse: continue NicoDerm patch.  Provide counseling   12.  Bilateral conjunctival inflammation.  Continue eyedrops.  Follow-up outpatient Dr.Brasington  - 5-31: Eyedrops expired, reordered.  Primary team may discontinue   13. Screening for vitamin D  deficiency:  vitamin D  level 9  -5/30 begin vitamin D  1000 u daily  14. Urinary urgency--persistent  -5/30 scan for pvr's bid---still not done---request again today  -recent ua negative. Pending pvr's consider myrbetriq  -pt has urinal at bedside, timed voids while awake  -encouraged him to ask for help,yell if he has to  5-31: PVRs mostly low, single episode of mild retention to 224 yesterday. ? BPH with intermittent incomplete emptying, urgency.  Will start with encouraging double voiding before adding additional medication.  15. Slow transit constipation  5/30-miralax  today and daily prn  -senokot-s 2 tabs at bedtime  -sorbitol  prn  - Last bowel movement 5-30    LOS: 3 days A FACE TO FACE EVALUATION WAS PERFORMED  Bea Lime 01/17/2024, 10:30 AM

## 2024-01-17 NOTE — Progress Notes (Signed)
 Speech Language Pathology Daily Session Note  Patient Details  Name: Lorris Carducci MRN: [id number] Date of Birth: 06-Dec-1970  Today's Date: 01/17/2024 SLP Individual Time: 1230-1330 SLP Individual Time Calculation (min): 60 min  Short Term Goals: Week 1: SLP Short Term Goal 1 (Week 1): Patient will utilize speech intelligibility strategies to reach 95% intelligibility at the conversational level given supervision verbal A SLP Short Term Goal 2 (Week 1): Patient will utilize safe swallowing strategies during consumption of least restrictive diet given supervision verbal A  Skilled Therapeutic Interventions: Skilled therapy session focused on communication and dysphagia goals. SLP observed patient during consumption of regular solids/thin liquids. Patient with mildly prolonged mastication times, though complete clearance with mod I use of strategies (lingual sweep, liquid wash). No s/sx of aspiration. Continue current diet. SLP targeted dysarthria goals through evaluation of anterior and posterior lingual strength. Utilizing IOPI ([place]  Oral Performance Instrument), patient with am average anterior kpa of 50 and posterior kpa of 53. Patient completed x20 repetitions of 40 kpa anteriorly and 45 kpa posteriorly to increase lingual strength for articulation. SLP continued to target communication goals through use of EMST (expiratory muscle strength training)  set at 50cm H20. Patient completed 5 sets of 5 repetitions given supervision-min verbal A. SLP educated patient on use of EMST outside of tx time as well as SLOP (slow, loud, over articulate, pause) strategies. Patient utilized strategies at the sentence level with supervisionA to reach 90% intelligibility. Patient left in chair with alarm set and call bell in reach. Continue POC.   Pain Denies  Therapy/Group: Individual Therapy  Constantine Ruddick M.A., CCC-SLP 01/17/2024, 7:49 AM

## 2024-01-17 NOTE — Progress Notes (Signed)
 Occupational Therapy Session Note  Patient Details  Name: Mark Mcintosh MRN: [id number] Date of Birth: 1971/08/19  Today's Date: 01/17/2024 OT Individual Time: 1100-1155 OT Individual Time Calculation (min): 55 min    Short Term Goals: Week 1:  OT Short Term Goal 1 (Week 1): Pt will maintain L UE in safety position during functional tasks and transfers with min A OT Short Term Goal 2 (Week 1): Pt will recall hemi-dressing techniques with min questioning cues OT Short Term Goal 3 (Week 1): Pt will complete toilet transfers with min A using LRAD OT Short Term Goal 4 (Week 1): Pt will complete LB dressing min A using LRAD  Skilled Therapeutic Interventions/Progress Updates:    Pt resting in w/c upon arrival. Pt declined shower this morning 2/2 no clean clothing. Skilled OT intervention with focus on funcitonal transfers, sitting balance, LUE NMR, LUE functional reaching, and safety awareness to increase independence with BADLs. Supine theract with focus on shoulder flexion and reaching/lowering with controlled movements with emphasis on eccentric movements. Sitting LUE tasks with focus on sitting posture to allow scapula movement with reaching task. Pt engages core when reaching but does not demonstrate upper trap compensation. WBing onto LUE elbow and pushing up to neutral with therapy mat elevated to prevent pt pushing through BLE. Improved shoulder flexion. Fucntional tranfses with CGA and min verbal cues for sequencing/safety. Pt returned to room and remained in w/c with seat alarm activated. All needs within reach.   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) Restrictions Weight Bearing Restrictions Per Provider Order: No    Pain:  Pt denies pain this morning     Other Treatments: Treatments Therapeutic Activity: LUE functional reaching for foam blocks  supported and unsupported Neuromuscular Facilitation: Left;Forced use;Upper Extremity;Activity to increase coordination;Activity to increase timing and sequencing;Activity to increase grading;Activity to increase lateral weight shifting;Activity to increase anterior-posterior weight shifting Weight Bearing Technique Weight Bearing Technique: Yes LUE Weight Bearing Technique: Forearm seated;Extended arm seated Response to Weight Bearing Technique: improved isolated shoulder flexion/extension   Therapy/Group: Individual Therapy  Doak Free 01/17/2024, 12:13 PM

## 2024-01-18 DIAGNOSIS — I6350 Cerebral infarction due to unspecified occlusion or stenosis of unspecified cerebral artery: Secondary | ICD-10-CM | POA: Diagnosis not present

## 2024-01-18 NOTE — Progress Notes (Signed)
 Physical Therapy Session Note  Patient Details  Name: Mose Colaizzi MRN: [id number] Date of Birth: 19-Jul-1971  Today's Date: 01/18/2024 PT Individual Time: 4098-1191 PT Individual Time Calculation (min): 42 min   Short Term Goals: Week 1:  PT Short Term Goal 1 (Week 1): Pt will complete bed mobility with CGA PT Short Term Goal 2 (Week 1): Pt will complete bed<>chair transfers minA with LRAD PT Short Term Goal 3 (Week 1): Pt will ambulate 162ft with minA and LRAD PT Short Term Goal 4 (Week 1): Pt will complete functional outcome measure to assess balance and falls risk  Skilled Therapeutic Interventions/Progress Updates:  Pt was seen bedside in the am, sitting on edge of bed requesting to take a shower. Pt transferred edge of bed to w/c with min A and verbal cues to the L side. Pt performed multiple transfers sit to stand and stand pivot to get onto shower chair and stand up at times during shower with min A and verbal cues. Pt impulsive at times requiring verbal cues to focus on task at hand. Pt required assistance with bathing R UE, back and back of lower extremities. Pt performed several sit to stand transfers with rolling walker and contact guard to min A to assist with donning brief and pants. Pt propelled w/c 150 feet x 2 with R UE and LE with S. Pt left sitting up in w/c at end of treatment with chair alarm on and call bell within reach.   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) Restrictions Weight Bearing Restrictions Per Provider Order: No General:   Pain: No c/o pain.    Therapy/Group: Individual Therapy  Tilton Fontan 01/18/2024, 12:33 PM

## 2024-01-18 NOTE — Progress Notes (Signed)
 Patient refused scheduled senna last 2 nights. LBM 5/31. Patient voiding continent in urinal. PVRs remain low.

## 2024-01-18 NOTE — Progress Notes (Signed)
 PROGRESS NOTE   Subjective/Complaints:  No Events overnight.  No acute complaints. Vitals stable PVRs 0/low since yesterday; doing well with instructions from yesterday.  ROS: Patient denies fever, rash, sore throat, blurred vision, dizziness, nausea, vomiting, diarrhea, cough, shortness of breath or chest pain, joint or back/neck pain, headache, or mood change.  + Urinary urgency--improving  Objective:   No results found.  No results for input(s): "WBC", "HGB", "HCT", "PLT" in the last 72 hours.  No results for input(s): "NA", "K", "CL", "CO2", "GLUCOSE", "BUN", "CREATININE", "CALCIUM " in the last 72 hours.   Intake/Output Summary (Last 24 hours) at 01/18/2024 1016 Last data filed at 01/18/2024 0746 Gross per 24 hour  Intake 240 ml  Output 1150 ml  Net -910 ml        Physical Exam: Vital Signs Blood pressure 129/89, pulse 83, temperature 98.6 F (37 C), resp. rate 18, height 5\' 11"  (1.803 m), weight 98 kg, SpO2 99%.  Constitutional: No distress . Vital signs reviewed.  Sitting upright in bedside chair. HEENT: NCAT, EOMI, oral membranes moist.  Mild facial droop. Neck: supple Cardiovascular: RRR without murmur. No JVD    Respiratory/Chest: CTA Bilaterally without wheezes or rales. Normal effort    GI/Abdomen: BS +, non-tender, non-distended Ext: no clubbing, cyanosis, or edema Psych: pleasant and cooperative  Skin: Clean and intact without signs of breakdown Neuro:  pt is alert and oriented x 3. Fair insight and awareness.  Mild to moderate memory deficits.   Normal language, significant dysarthria.  Left central VII and mild left tongue deviation.  LUE remains 2- to  2/5 prox to distal.  LLE is 4- to 4/5 prox to distal.  Sensory exam normal for light touch and pain in all 4 limbs.  No limb ataxia or cerebellar signs.  No abnormal tone appreciated.    Musculoskeletal: Full ROM, No pain with AROM or PROM in  the neck, trunk, or extremities. Posture appropriate   Physical exam unchanged from the above on reexamination 01/18/24    Assessment/Plan: 1. Functional deficits which require 3+ hours per day of interdisciplinary therapy in a comprehensive inpatient rehab setting. Physiatrist is providing close team supervision and 24 hour management of active medical problems listed below. Physiatrist and rehab team continue to assess barriers to discharge/monitor patient progress toward functional and medical goals  Care Tool:  Bathing    Body parts bathed by patient: Left arm, Chest, Abdomen, Front perineal area, Right upper leg, Left upper leg, Right lower leg, Face, Left lower leg   Body parts bathed by helper: Right arm, Buttocks     Bathing assist Assist Level: Minimal Assistance - Patient > 75%     Upper Body Dressing/Undressing Upper body dressing   What is the patient wearing?: Pull over shirt    Upper body assist Assist Level: Minimal Assistance - Patient > 75%    Lower Body Dressing/Undressing Lower body dressing      What is the patient wearing?: Underwear/pull up, Pants     Lower body assist Assist for lower body dressing: Moderate Assistance - Patient 50 - 74%     Toileting Toileting    Toileting assist Assist for toileting:  Moderate Assistance - Patient 50 - 74%     Transfers Chair/bed transfer  Transfers assist     Chair/bed transfer assist level: Contact Guard/Touching assist     Locomotion Ambulation   Ambulation assist      Assist level: Minimal Assistance - Patient > 75% Assistive device: Other (comment) (R hand rail) Max distance: 9ft   Walk 10 feet activity   Assist     Assist level: Minimal Assistance - Patient > 75% Assistive device: Other (comment) (R hand rail)   Walk 50 feet activity   Assist    Assist level: Minimal Assistance - Patient > 75% Assistive device: Other (comment) (R hand rail)    Walk 150 feet  activity   Assist Walk 150 feet activity did not occur: Safety/medical concerns         Walk 10 feet on uneven surface  activity   Assist Walk 10 feet on uneven surfaces activity did not occur: Safety/medical concerns         Wheelchair     Assist Is the patient using a wheelchair?: Yes Type of Wheelchair: Manual    Wheelchair assist level: Dependent - Patient 0%      Wheelchair 50 feet with 2 turns activity    Assist        Assist Level: Dependent - Patient 0%   Wheelchair 150 feet activity     Assist      Assist Level: Dependent - Patient 0%   Blood pressure 129/89, pulse 83, temperature 98.6 F (37 C), resp. rate 18, height 5\' 11"  (1.803 m), weight 98 kg, SpO2 99%.  Medical Problem List and Plan: 1. Functional deficits secondary to right PLIC, frontal and pontine infarct with TEE showing small PFO no plan for closure or intervention             -patient may shower             -ELOS/Goals:  S 8-12 days            -Continue CIR therapies including PT, OT, and SLP    2.  Impaired mobility -DVT/anticoagulation:  Pharmaceutical: Lovenox              -antiplatelet therapy: continue Aspirin  81 mg daily and Plavix  75 mg day x 3 weeks then aspirin  alone   3. Pain Management: Tylenol  as needed   4. Mood/Behavior/Sleep: Provide emotional support             -antipsychotic agents: N/A   5. Neuropsych/cognition: This patient is capable of making decisions on his own behalf.   6. Skin/Wound Care: Routine skin checks 7. Fluids/Electrolytes/Nutrition: pt reports good appetite  -5-31: P.o. intakes 100% for most meals.  Appropriate.   8.  Hypertension.  Continue Norvasc  10 mg daily.  Monitor with increased mobility.    - Added avapro  75mg  daily.   -5/30 improved control. Bp's trending down--obsv only today   - 5-31: Normotensive    01/18/2024    4:01 AM 01/17/2024    7:14 PM 01/17/2024    5:08 PM  Vitals with BMI  Systolic 129 130 829  Diastolic 89  85 82  Pulse 83 84 71    9.  Hyperlipidemia: continue Lipitor   10.  Class II morbid obesity.  BMI 36.26.  Dietary follow-up   11.  Tobacco abuse: continue NicoDerm patch.  Provide counseling   12.  Bilateral conjunctival inflammation.  Continue eyedrops.  Follow-up outpatient Dr.Brasington  - 5-31: Eyedrops expired,  reordered.  Primary team may discontinue   13. Screening for vitamin D  deficiency:  vitamin D  level 9  -5/30 begin vitamin D  1000 u daily  14. Urinary urgency--persistent  -5/30 scan for pvr's bid---still not done---request again today  -recent ua negative. Pending pvr's consider myrbetriq  -pt has urinal at bedside, timed voids while awake  -encouraged him to ask for help,yell if he has to  5-31: PVRs mostly low, single episode of mild retention to 224 yesterday. ? BPH with intermittent incomplete emptying, urgency.  Will start with encouraging double voiding before adding additional medication.  6/1: PVRs low - monitor one more day then DC  15. Slow transit constipation  5/30-miralax  today and daily prn  -senokot-s 2 tabs at bedtime  -sorbitol  prn  - Last bowel movement 5-30    LOS: 4 days A FACE TO FACE EVALUATION WAS PERFORMED  Mark Mcintosh 01/18/2024, 10:16 AM

## 2024-01-19 DIAGNOSIS — I6350 Cerebral infarction due to unspecified occlusion or stenosis of unspecified cerebral artery: Secondary | ICD-10-CM | POA: Diagnosis not present

## 2024-01-19 MED ORDER — SENNOSIDES-DOCUSATE SODIUM 8.6-50 MG PO TABS
2.0000 | ORAL_TABLET | Freq: Two times a day (BID) | ORAL | Status: DC
  Administered 2024-01-19 – 2024-01-20 (×2): 2 via ORAL
  Filled 2024-01-19 (×6): qty 2

## 2024-01-19 NOTE — Progress Notes (Signed)
 Occupational Therapy Session Note  Patient Details  Name: Mark Mcintosh MRN: [id number] Date of Birth: 08/31/1970  Today's Date: 01/19/2024 OT Individual Time: 4098-1191 OT Individual Time Calculation (min): 60 min    Short Term Goals: Week 1:  OT Short Term Goal 1 (Week 1): Pt will maintain L UE in safety position during functional tasks and transfers with min A OT Short Term Goal 2 (Week 1): Pt will recall hemi-dressing techniques with min questioning cues OT Short Term Goal 3 (Week 1): Pt will complete toilet transfers with min A using LRAD OT Short Term Goal 4 (Week 1): Pt will complete LB dressing min A using LRAD  Skilled Therapeutic Interventions/Progress Updates:  Pt greeted seated in w/c, pt agreeable to OT intervention.      Transfers/bed mobility/functional mobility: pt completed all functional ambulation with RW and L saddle splint with CGA- MINA.   Therapeutic activity:  Pt completed functional ambulation task with pt instructed to ambulate ~ 10 ft to transport horseshoes from one surface to another with a focus on functional reaching with LUE, managing Rw during tight turns and short distance functional ambulation. Pt completed task with CGA- MIN A for balance. MIN verbal cues for Rw mgmt and recalling to apply saddle splint.   Pt completed functional reaching/grasping task with pt instructed to reach to vertical board to place remove pegs from board with LUE in saebo arm mobilizer  to eliminate gravity. Donned make shift wrist cock up splint to maintain wrist in neutral position. Pt completed task with + time and effort but overall supervision.    Ended session with pt seated in w/c with all needs within reach and chair alarm activated.                    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) Restrictions Weight Bearing Restrictions Per  Provider Order: No  Pain: no pain     Therapy/Group: Individual Therapy  Mollie Anger [facility] 01/19/2024, 12:15 PM

## 2024-01-19 NOTE — Progress Notes (Signed)
 Occupational Therapy Session Note  Patient Details  Name: Mark Mcintosh MRN: [id number] Date of Birth: 06-11-1971  Today's Date: 01/19/2024 OT Individual Time: 6213-0865 OT Individual Time Calculation (min): 69 min    Short Term Goals: Week 1:  OT Short Term Goal 1 (Week 1): Pt will maintain L UE in safety position during functional tasks and transfers with min A OT Short Term Goal 2 (Week 1): Pt will recall hemi-dressing techniques with min questioning cues OT Short Term Goal 3 (Week 1): Pt will complete toilet transfers with min A using LRAD OT Short Term Goal 4 (Week 1): Pt will complete LB dressing min A using LRAD  Skilled Therapeutic Interventions/Progress Updates:     Pt received sitting up in wc presenting to be in good spirits receptive to skilled OT session reporting 0/10 pain- OT offering intermittent rest breaks, repositioning, and therapeutic support to optimize participation in therapy session. Focused this session on BADL retraining, L UE NMRE, Pt education, safety awareness, and activity tolerance.   Mobility:  -Functional Mobility: Pt completed functional mobility <> bathroom this session using RW with L saddle splint with CGA/min A required overall for balance and RW positioning +verbal cues for safety awareness and L LE positioning. Pt tends to move quickly during mobility which places him at increased risk for falls- re-educated Pt on fall prevention, impact of CVA on functional status, and on importance of taking time during ADLs/mobility to avoid falls with Pt receptive to education and motivated to learn. -Pt completed wc propulsion using hemi technique <> therapy gym for endurance training with supervision- verbal cues required for safety.   ADLs:  -Toileting: Pt stood at toilet using RW for continent void in toilet maintaining standing balance with min A and managing clothing with min A to bring pants off L hip +increased time -Bathing: U/LB bathing completed  sitting on TTB using hemi-techniques with CGA provided overall. Pt with improved functional use of L UE during bathing tasks and able to wash R UE/under arm, face, chest, and upper portion of legs using L UE at a diminished level!! Pt stood at grab bar to wash buttocks with light min A provided for balance and verbal cues for LLE positioning to increase BOS for increased safety.  -Grooming/hygiene: Pt applied lotion, brushed teeth, and groomed hair with set-up A using hemi-techniques.  -Dressing: U/LB dressing completed sitting in wc. Pt able to recall hemi-techniques with min questioning cues donning shirt with supervision +increased time and pants with min A required only to fully adjust pants over L hip.   NMRE:  -Pt completed dynamic standing balance functional reaching activity using RW for balance with posterior reaching and blocked practice of sit<>stands incorporated into task to simulate skills required for ADLs. Positioned large vertical mirror anterior to Pt for increased visual feedback of body alignment and tasked Pt with completing sit > stand to RW and reaching posteriorly using L UE to retrieve horse shoe from mat table and hooking it overtop of vertical mirror. Pt then reversed activity to remove horse shoes from mirror with increased challenge noted. Pt required CGA to light min A overall for dynamic balance and consistent verbal and tactile cues for functional reaching technique to eliminate compensatory shoulder elevation and trunk movements. Focused intervention on improving functional movement patterns and decreasing compensatory movements.  -Sitting EOM, engaged Pt in completing lateral leans onto L UE with Pt instructed to lean onto forearm while using R UE to reach across midline to retrieve bean  bags positioned on Pt's L side with an emphasis on WB'ing into L UE and using L UE to push into mat to return to midline. Pt then reversed task to place bean bags back onto L side of mat by  lowering onto forearm. Pt required  CGA overall for stabilization of L UE and verbal cues for technique.    Pt was left resting in wc with call bell in reach, chair alarm on, and all needs met.    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) Restrictions Weight Bearing Restrictions Per Provider Order: No   Therapy/Group: Individual Therapy  Geoffery Kiel 01/19/2024, 1:01 PM

## 2024-01-19 NOTE — Progress Notes (Signed)
 Physical Therapy Session Note  Patient Details  Name: Mark Mcintosh MRN: [id number] Date of Birth: 08-25-1970  Today's Date: 01/19/2024 PT Individual Time: 1007-1101 PT Individual Time Calculation (min): 54 min   Short Term Goals: Week 1:  PT Short Term Goal 1 (Week 1): Pt will complete bed mobility with CGA PT Short Term Goal 2 (Week 1): Pt will complete bed<>chair transfers minA with LRAD PT Short Term Goal 3 (Week 1): Pt will ambulate 15ft with minA and LRAD PT Short Term Goal 4 (Week 1): Pt will complete functional outcome measure to assess balance and falls risk  Skilled Therapeutic Interventions/Progress Updates:     Pt received seated in [facility] and agrees to therapy. No complaint of pain. WC transport to gym. Pt performs sit to stand with CGA and cues for initiation and body mechanics. Pt ambulates x80' with minA/modA and cues for postural stability, lateral weight shifting, and utilizing reciprocal gait pattern. Pt has slight LOB when performing turn, requiring modA. Following rest break, pt stands and ambulates x175' with same assistance and cues, with pt noted to begin dragging LLE during swing phase with fatigue. PT facilitate lateral weight shifting to promote improved swing through of LLE. Pt then completes alternating foot taps on 4" step without AD, requiring minA primarily and up to modA with fatigue and impaired postural control. Pt completes 3x10 with seated rest break. WC transport back to room. Pt left seated with all needs within reach.   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) Restrictions Weight Bearing Restrictions Per Provider Order: No   Therapy/Group: Individual Therapy  Neva Barban, PT, DPT 01/19/2024, 5:21 PM

## 2024-01-19 NOTE — Progress Notes (Signed)
 PROGRESS NOTE   Subjective/Complaints:    ROS: Patient denies CP, SOB, N/V/D  Objective:   No results found.  No results for input(s): "WBC", "HGB", "HCT", "PLT" in the last 72 hours.  No results for input(s): "NA", "K", "CL", "CO2", "GLUCOSE", "BUN", "CREATININE", "CALCIUM " in the last 72 hours.   Intake/Output Summary (Last 24 hours) at 01/19/2024 0819 Last data filed at 01/19/2024 0800 Gross per 24 hour  Intake 1260 ml  Output 2525 ml  Net -1265 ml        Physical Exam: Vital Signs Blood pressure 115/89, pulse 81, temperature 98.5 F (36.9 C), temperature source Oral, resp. rate 18, height 5\' 11"  (1.803 m), weight 98 kg, SpO2 97%.   General: No acute distress Mood and affect are appropriate Heart: Regular rate and rhythm no rubs murmurs or extra sounds Lungs: Clear to auscultation, breathing unlabored, no rales or wheezes Abdomen: Positive bowel sounds, soft nontender to palpation, nondistended Extremities: No clubbing, cyanosis, or edema Skin: No evidence of breakdown, no evidence of rash   Neuro:  pt is alert and oriented x 3. Fair insight and awareness.   Normal language,mild/mod dysarthria.  Left central VII and mild left tongue deviation.  LUE remains 3-/5 prox to distal.  LLE is 4+/5 prox to distal.  Sensory exam normal for light touch and pain in all 4 limbs.  No limb ataxia or cerebellar signs.  No abnormal tone appreciated.    Musculoskeletal: Full ROM, No pain with AROM or PROM in the neck, trunk, or extremities. Posture appropriate   Physical exam unchanged from the above on reexamination 01/19/24    Assessment/Plan: 1. Functional deficits which require 3+ hours per day of interdisciplinary therapy in a comprehensive inpatient rehab setting. Physiatrist is providing close team supervision and 24 hour management of active medical problems listed below. Physiatrist and rehab team continue to  assess barriers to discharge/monitor patient progress toward functional and medical goals  Care Tool:  Bathing    Body parts bathed by patient: Left arm, Chest, Abdomen, Front perineal area, Right upper leg, Left upper leg, Right lower leg, Face, Left lower leg   Body parts bathed by helper: Right arm, Buttocks     Bathing assist Assist Level: Minimal Assistance - Patient > 75%     Upper Body Dressing/Undressing Upper body dressing   What is the patient wearing?: Pull over shirt    Upper body assist Assist Level: Minimal Assistance - Patient > 75%    Lower Body Dressing/Undressing Lower body dressing      What is the patient wearing?: Underwear/pull up, Pants     Lower body assist Assist for lower body dressing: Moderate Assistance - Patient 50 - 74%     Toileting Toileting    Toileting assist Assist for toileting: Moderate Assistance - Patient 50 - 74%     Transfers Chair/bed transfer  Transfers assist     Chair/bed transfer assist level: Minimal Assistance - Patient > 75%     Locomotion Ambulation   Ambulation assist      Assist level: Minimal Assistance - Patient > 75% Assistive device: Other (comment) (R hand rail) Max distance: 41ft  Walk 10 feet activity   Assist     Assist level: Minimal Assistance - Patient > 75% Assistive device: Other (comment) (R hand rail)   Walk 50 feet activity   Assist    Assist level: Minimal Assistance - Patient > 75% Assistive device: Other (comment) (R hand rail)    Walk 150 feet activity   Assist Walk 150 feet activity did not occur: Safety/medical concerns         Walk 10 feet on uneven surface  activity   Assist Walk 10 feet on uneven surfaces activity did not occur: Safety/medical concerns         Wheelchair     Assist Is the patient using a wheelchair?: Yes Type of Wheelchair: Manual    Wheelchair assist level: Supervision/Verbal cueing Max wheelchair distance: 150     Wheelchair 50 feet with 2 turns activity    Assist        Assist Level: Supervision/Verbal cueing   Wheelchair 150 feet activity     Assist      Assist Level: Supervision/Verbal cueing   Blood pressure 115/89, pulse 81, temperature 98.5 F (36.9 C), temperature source Oral, resp. rate 18, height 5\' 11"  (1.803 m), weight 98 kg, SpO2 97%.  Medical Problem List and Plan: 1. Functional deficits secondary to right PLIC, frontal and pontine infarct with TEE showing small PFO no plan for closure or intervention             -patient may shower             -ELOS/Goals:  S 8-12 days            -Continue CIR therapies including PT, OT, and SLP    2.  Impaired mobility -DVT/anticoagulation:  Pharmaceutical: Lovenox              -antiplatelet therapy: continue Aspirin  81 mg daily and Plavix  75 mg day x 3 weeks then aspirin  alone   3. Pain Management: Tylenol  as needed   4. Mood/Behavior/Sleep: Provide emotional support             -antipsychotic agents: N/A   5. Neuropsych/cognition: This patient is capable of making decisions on his own behalf.   6. Skin/Wound Care: Routine skin checks 7. Fluids/Electrolytes/Nutrition: pt reports good appetite  -5-31: P.o. intakes 100% for most meals.  Appropriate.   8.  Hypertension.  Continue Norvasc  10 mg daily.  Monitor with increased mobility.    - Added avapro  75mg  daily.   -5/30 improved control. Bp's trending down--obsv only today   - 5-31: Normotensive    01/19/2024    3:52 AM 01/18/2024    7:11 PM 01/18/2024    1:34 PM  Vitals with BMI  Systolic 115 123 161  Diastolic 89 94 77  Pulse 81 76   Mild diastolic elevation but overall controlled   9.  Hyperlipidemia: continue Lipitor   10.  Class II morbid obesity.  BMI 36.26.  Dietary follow-up   11.  Tobacco abuse: continue NicoDerm patch.  Provide counseling   12.  Bilateral conjunctival inflammation.  Continue eyedrops.  Follow-up outpatient Dr.Brasington  - 5-31:  Eyedrops expired, reordered.  Primary team may discontinue   13. Screening for vitamin D  deficiency:  vitamin D  level 9  -5/30 begin vitamin D  1000 u daily  14. Urinary urgency--persistent  -5/30 scan for pvr's bid---still not done---request again today  -recent ua negative. Pending pvr's consider myrbetriq  -pt has urinal at bedside, timed voids while  awake  -encouraged him to ask for help,yell if he has to  5-31: PVRs mostly low, single episode of mild retention to 224 yesterday. ? BPH with intermittent incomplete emptying, urgency.  Will start with encouraging double voiding before adding additional medication.  6/1: PVRs low - monitor one more day then DC  15. Slow transit constipation  5/30-miralax  today and daily prn  -senokot-s 2 tabs at bedtime  -sorbitol  prn  - Last bowel movement 5-31- increase senna to BID     LOS: 5 days A FACE TO FACE EVALUATION WAS PERFORMED  Genetta Kenning 01/19/2024, 8:19 AM

## 2024-01-20 ENCOUNTER — Ambulatory Visit: Admitting: Physician Assistant

## 2024-01-20 DIAGNOSIS — I6350 Cerebral infarction due to unspecified occlusion or stenosis of unspecified cerebral artery: Secondary | ICD-10-CM | POA: Diagnosis not present

## 2024-01-20 DIAGNOSIS — F54 Psychological and behavioral factors associated with disorders or diseases classified elsewhere: Secondary | ICD-10-CM | POA: Diagnosis not present

## 2024-01-20 NOTE — Progress Notes (Signed)
 Occupational Therapy Session Note  Patient Details  Name: Mark Mcintosh MRN: [id number] Date of Birth: 03-08-1971  Today's Date: 01/20/2024 OT Individual Time: 1105-1200 OT Individual Time Calculation (min): 55 min    Short Term Goals: Week 1:  OT Short Term Goal 1 (Week 1): Pt will maintain L UE in safety position during functional tasks and transfers with min A OT Short Term Goal 2 (Week 1): Pt will recall hemi-dressing techniques with min questioning cues OT Short Term Goal 3 (Week 1): Pt will complete toilet transfers with min A using LRAD OT Short Term Goal 4 (Week 1): Pt will complete LB dressing min A using LRAD  Skilled Therapeutic Interventions/Progress Updates:     Patient agreeable to participate in OT session. Reports 0/10 pain level.   Patient participated in skilled OT session focusing on LUE NM re-ed.    Therapist facilitated LUE muscle activation while seated utilizing muscle vibration, and AA/ROM movement in order to facilitate greater muscle movement and ability to utilize LUE to complete functional ADL tasks. Pillow case placed under LUE to decrease friction with table top surface. Completed the following movements: elbow flexion/extension, scapular protraction/retraction, horizontal abduction/adduction, 5-10X.   LUE A/ROM, strengthening and GMC with pt maintaining grasp of rolled washcloth (independent with placement) high reps hitting rolled ankle weights (1.5, 2, & 2.5lb used)  and large bean bags for increased intensity. Placement of targets adjusted to facilitate scapular protraction, and horizontal abduction/adduction. Improved A/ROM noted during task and when wrist weight was removed.   With min A, pt donned hospital gloves and completed bilateral hand coordination task while cleaning 4-5 bean bags with increased time.   Therapy Documentation Precautions:  Precautions Precautions: Fall Recall of Precautions/Restrictions: Impaired Precaution/Restrictions  Comments: L hemi (UE > LE) Restrictions Weight Bearing Restrictions Per Provider Order: No  Therapy/Group: Individual Therapy  Carollee Circle, OTR/L,CBIS  Supplemental OT - [facility] and [facility] Secure Chat Preferred   01/20/2024, 8:23 AM

## 2024-01-20 NOTE — Progress Notes (Signed)
 Patient ID: Mark Mcintosh, male   DOB: Aug 06, 1971, 53 y.o.   MRN: [id number]  SW faxed medical provider for disability to [employer] (p:[phone]/[phone]).  Norval Been, MSW, LCSW Office: [phone] Cell: [phone] Fax: [phone]

## 2024-01-20 NOTE — Plan of Care (Signed)
  Problem: RH Toileting Goal: LTG Patient will perform toileting task (3/3 steps) with assistance level (OT) Description: LTG: Patient will perform toileting task (3/3 steps) with assistance level (OT)  Flowsheets (Taken 01/20/2024 1151) LTG: Pt will perform toileting task (3/3 steps) with assistance level: (LTG upgraded due to progress.) Independent with assistive device Note: LTG upgraded due to progress.    Problem: RH Toilet Transfers Goal: LTG Patient will perform toilet transfers w/assist (OT) Description: LTG: Patient will perform toilet transfers with assist, with/without cues using equipment (OT) Flowsheets (Taken 01/20/2024 1151) LTG: Pt will perform toilet transfers with assistance level of: (LTG upgraded due to progress.) Supervision/Verbal cueing Note: LTG upgraded due to progress.

## 2024-01-20 NOTE — Progress Notes (Signed)
 PROGRESS NOTE   Subjective/Complaints:  No issues overnite, feels like he may move bowels this am , discussed bowel program with pt and LPN   ROS: Patient denies CP, SOB, N/V/D  Objective:   No results found.  No results for input(s): "WBC", "HGB", "HCT", "PLT" in the last 72 hours.  No results for input(s): "NA", "K", "CL", "CO2", "GLUCOSE", "BUN", "CREATININE", "CALCIUM " in the last 72 hours.   Intake/Output Summary (Last 24 hours) at 01/20/2024 0738 Last data filed at 01/20/2024 0713 Gross per 24 hour  Intake 720 ml  Output 1650 ml  Net -930 ml        Physical Exam: Vital Signs Blood pressure 124/81, pulse 66, temperature 98.1 F (36.7 C), resp. rate 18, height 5\' 11"  (1.803 m), weight 98 kg, SpO2 99%.   General: No acute distress Mood and affect are appropriate Heart: Regular rate and rhythm no rubs murmurs or extra sounds Lungs: Clear to auscultation, breathing unlabored, no rales or wheezes Abdomen: Positive bowel sounds, soft nontender to palpation, nondistended Extremities: No clubbing, cyanosis, or edema Skin: No evidence of breakdown, no evidence of rash   Neuro:  pt is alert and oriented x 3. Fair insight and awareness.   Normal language,mild/mod dysarthria.  Left central VII and mild left tongue deviation.  LUE remains 3-/5 prox to distal.  LLE is 4+/5 prox to distal.  Sensory exam normal for light touch and pain in all 4 limbs.  No limb ataxia or cerebellar signs.  No abnormal tone appreciated.    Musculoskeletal: Full ROM, No pain with AROM or PROM in the neck, trunk, or extremities. Posture appropriate   Physical exam unchanged from the above on reexamination 01/20/24    Assessment/Plan: 1. Functional deficits which require 3+ hours per day of interdisciplinary therapy in a comprehensive inpatient rehab setting. Physiatrist is providing close team supervision and 24 hour management of  active medical problems listed below. Physiatrist and rehab team continue to assess barriers to discharge/monitor patient progress toward functional and medical goals  Care Tool:  Bathing    Body parts bathed by patient: Left arm, Chest, Abdomen, Front perineal area, Right upper leg, Left upper leg, Right lower leg, Face, Left lower leg, Right arm, Buttocks   Body parts bathed by helper: Right arm, Buttocks     Bathing assist Assist Level: Contact Guard/Touching assist     Upper Body Dressing/Undressing Upper body dressing   What is the patient wearing?: Pull over shirt    Upper body assist Assist Level: Supervision/Verbal cueing    Lower Body Dressing/Undressing Lower body dressing      What is the patient wearing?: Underwear/pull up, Pants     Lower body assist Assist for lower body dressing: Minimal Assistance - Patient > 75%     Toileting Toileting    Toileting assist Assist for toileting: Moderate Assistance - Patient 50 - 74%     Transfers Chair/bed transfer  Transfers assist     Chair/bed transfer assist level: Minimal Assistance - Patient > 75%     Locomotion Ambulation   Ambulation assist      Assist level: Minimal Assistance - Patient > 75% Assistive  device: No Device Max distance: 175'   Walk 10 feet activity   Assist     Assist level: Minimal Assistance - Patient > 75% Assistive device: No Device   Walk 50 feet activity   Assist    Assist level: Minimal Assistance - Patient > 75% Assistive device: No Device    Walk 150 feet activity   Assist Walk 150 feet activity did not occur: Safety/medical concerns  Assist level: Minimal Assistance - Patient > 75% Assistive device: No Device    Walk 10 feet on uneven surface  activity   Assist Walk 10 feet on uneven surfaces activity did not occur: Safety/medical concerns         Wheelchair     Assist Is the patient using a wheelchair?: Yes Type of Wheelchair:  Manual    Wheelchair assist level: Supervision/Verbal cueing Max wheelchair distance: 150    Wheelchair 50 feet with 2 turns activity    Assist        Assist Level: Supervision/Verbal cueing   Wheelchair 150 feet activity     Assist      Assist Level: Supervision/Verbal cueing   Blood pressure 124/81, pulse 66, temperature 98.1 F (36.7 C), resp. rate 18, height 5\' 11"  (1.803 m), weight 98 kg, SpO2 99%.  Medical Problem List and Plan: 1. Functional deficits secondary to right PLIC, frontal and pontine infarct with TEE showing small PFO no plan for closure or intervention             -patient may shower             -ELOS/Goals:  S 8-12 days            -Continue CIR therapies including PT, OT, and SLP   team conference in am  2.  Impaired mobility -DVT/anticoagulation:  Pharmaceutical: Lovenox              -antiplatelet therapy: continue Aspirin  81 mg daily and Plavix  75 mg day x 3 weeks then aspirin  alone   3. Pain Management: Tylenol  as needed   4. Mood/Behavior/Sleep: Provide emotional support             -antipsychotic agents: N/A   5. Neuropsych/cognition: This patient is capable of making decisions on his own behalf.   6. Skin/Wound Care: Routine skin checks 7. Fluids/Electrolytes/Nutrition: pt reports good appetite  -5-31: P.o. intakes 100% for most meals.  Appropriate.   8.  Hypertension.  Continue Norvasc  10 mg daily.  Monitor with increased mobility.    - Added avapro  75mg  daily.   -5/30 improved control. Bp's trending down--obsv only today   - 5-31: Normotensive    01/20/2024    3:35 AM 01/19/2024    7:24 PM 01/19/2024   12:55 PM  Vitals with BMI  Systolic 124 118 784  Diastolic 81 77 104  Pulse 66 70 97  Mild diastolic elevation but overall controlled   9.  Hyperlipidemia: continue Lipitor   10.  Class II morbid obesity.  BMI 36.26.  Dietary follow-up   11.  Tobacco abuse: continue NicoDerm patch.  Provide counseling   12.  Bilateral  conjunctival inflammation.  Continue eyedrops.  Follow-up outpatient Dr.Brasington  - 5-31: Eyedrops expired, reordered.  Primary team may discontinue   13. Screening for vitamin D  deficiency:  vitamin D  level 9  -5/30 begin vitamin D  1000 u daily  14. Urinary urgency--persistent  -5/30 scan for pvr's bid---still not done---request again today  -recent ua negative. Pending pvr's consider  myrbetriq  -pt has urinal at bedside, timed voids while awake  -encouraged him to ask for help,yell if he has to  5-31: PVRs mostly low, single episode of mild retention to 224 yesterday. ? BPH with intermittent incomplete emptying, urgency.  Will start with encouraging double voiding before adding additional medication.  6/1: PVRs low - monitor one more day then DC  15. Slow transit constipation  5/30-miralax  today and daily prn  -senokot-s 2 tabs at bedtime  -sorbitol  prn  - Last bowel movement 5-31- increase senna to BID - sorbitol  today if no BM     LOS: 6 days A FACE TO FACE EVALUATION WAS PERFORMED  Genetta Kenning 01/20/2024, 7:38 AM

## 2024-01-20 NOTE — Progress Notes (Signed)
 Speech Language Pathology Daily Session Note  Patient Details  Name: Mark Mcintosh MRN: [id number] Date of Birth: May 15, 1971  Today's Date: 01/20/2024 SLP Individual Time: 1400-1500 SLP Individual Time Calculation (min): 60 min  Short Term Goals: Week 1: SLP Short Term Goal 1 (Week 1): Patient will utilize speech intelligibility strategies to reach 95% intelligibility at the conversational level given supervision verbal A SLP Short Term Goal 2 (Week 1): Patient will utilize safe swallowing strategies during consumption of least restrictive diet given supervision verbal A  Skilled Therapeutic Interventions:   Pt greeted in his room after completion of Neuropsych appointment. He was up in his wheelchair and agreeable to tx tasks targeting speech production. SLP facilitated tongue strengthening via IOPI. He completed reps in the anterior and posterior position: Anterior - 10 reps @ 42 kpa and 20 reps @ 43 kpa. Posterior - 20 reps @ 48 kpa and 10 reps @ 47 kpa. Attempted to complete posterior reps @ 49 kpa initially, though too difficult to complete consistently. EMST also targeted via EMST150. Resistance increased to ~57 cmH2O and he completed 25 reps w/ only supervision cues to maintain optimal technique. SLP provided pt w/ EMST checklist and encouraged him to complete EMST BID as possible. At the end of tx tasks, he was left in his chair with the alarm set and call light within reach. Recommend cont ST.   Pain  No pain reported  Therapy/Group: Individual Therapy  Rozell Cornet 01/20/2024, 2:58 PM

## 2024-01-20 NOTE — Consult Note (Signed)
 Neuropsychological Consultation Comprehensive Inpatient Rehab   Patient:   Mark Mcintosh   DOB:   1970-12-15  MR Number:  [id number]  Location:  [facility] [address] [place] [address] Dept: [phone] Loc: [phone]           Date of Service:   02/16/2024  Start Time:   2 PM End Time:   3 PM  Provider/Observer:  Mark Mcintosh, Psy.D.       Clinical Neuropsychologist       Billing Code/Service: [phone]  Reason for Service:    Mark Mcintosh is a 53 year old male referred for neuropsychological consultation due to coping and adjustment issues after recent CVA with the patient currently admitted to the comprehensive inpatient rehabilitation unit.  Patient has a past medical history including bilateral conjunctival inflammation being followed by ophthalmology, hypertension with tobacco use, obesity.  Patient presented to [facility] on 01/09/2024 with acute onset of left-sided weakness.  MRI showed acute infarcts in the posterior limb of the right internal capsule, overlying right frontal white matter, and pons.  There was also indications of multiple remote infarcts and chronic microvascular ischemic disease.  Neurology was consulted and adjustments made for anticoagulants.  Therapy evaluations were completed and the patient was admitted to CIR due to left-sided weakness and functional mobility deficits.  During today's clinical visit the patient admits struggling with taking care of himself particularly around his metabolic status.  Patient would like to quit smoking and is using the patch on the unit and would like to continue that.  Has requested prescription for nicotine  patch postdischarge.  We spent some time talking about more relatively effective strategy for smoking cessation.  Patient was unaware of previous strokes.  We talked about brain regions involved with his most  recent stroke and how they affect motor functioning.  Worked on coping styles and strategies which have affected his medical status including avoidance of dealing with challenges in the moment and procrastination etc.  Patient acknowledges feeling dysphoric/depressed at times but denies significant depression and denies mood disturbance keeping him from participating in therapies.  Patient does have a strong motivation for discharge but now is understanding need for rehab and the fact that this is very beneficial for him if he completes the process.  HPI for the current admission:    HPI: Mark Mcintosh is a 53 year old right-handed male with history of bilateral conjunctival inflammation followed by ophthalmology services Dr. Ignatius Mcintosh, hypertension as well as tobacco use and class II morbid obesity with BMI 36.26. Per chart review patient lives alone. 1 level home one-step to enter. Independent prior to admission. Presented to [facility] 01/09/2024 with acute onset of left-sided weakness. MRI showed acute infarcts in the posterior limb of the right internal capsule, the overlying right frontal white matter, and pons. Multiple remote infarcts and chronic microvascular ischemic disease. CTA showed no large vessel occlusion. Admission chemistries unremarkable except potassium 3.1, hemoglobin A1c 5.7. 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 TEE with small PFO per cardiology services Dr Mark Mcintosh and no plan for closure. Neurology follow-up placed on aspirin  and Plavix  for CVA prophylaxis x 3 weeks then aspirin  alone. Lovenox  for DVT prophylaxis and bilateral lower extremity Dopplers negative. Therapy evaluations completed due to patient decreased functional ability left-sided weakness was admitted for a comprehensive rehab program. Currently ambulating MinA 320 feet.   Medical History:   Past Medical History:  Diagnosis Date  Arthritis    Hypertension          Patient Active Problem List   Diagnosis  Date Noted   Coping style affecting medical condition 01/20/2024   Right pontine cerebrovascular accident [facility]) 01/14/2024   Acute CVA (cerebrovascular accident) (HCC) 01/09/2024   Tobacco use disorder 01/09/2024   Obesity (BMI 30-39.9) 01/09/2024   Hypertension     Behavioral Observation/Mental Status:   Mark Mcintosh  presents as a 53 y.o.-year-old Right handed African American Male who appeared his stated age. his dress was Appropriate and he was Well Groomed and his manners were Appropriate to the situation.  his participation was indicative of Appropriate and Attentive behaviors.  There were physical disabilities noted.  he displayed an appropriate level of cooperation and motivation.    Interactions:    Active Appropriate  Attention:   within normal limits and attention span and concentration were age appropriate  Memory:   within normal limits; recent and remote memory intact  Visuo-spatial:   not examined  Speech (Volume):  low  Speech:   normal; normal  Thought Process:  Coherent and Relevant  Concrete and Linear  Though Content:  WNL; not suicidal and not homicidal  Orientation:   person, place, time/date, and situation  Judgment:   Fair  Planning:   Fair  Affect:    Appropriate  Mood:    Dysphoric  Insight:   Fair  Intelligence:   normal  Psychiatric History:  No prior psychiatric history  History of Substance Use or Abuse:  There is a documented history of alcohol and marijuana abuse confirmed by the patient.    Family Med/Psych History: History reviewed. No pertinent family history.  Impression/DX:   Mark Mcintosh is a 53 year old male referred for neuropsychological consultation due to coping and adjustment issues after recent CVA with the patient currently admitted to the comprehensive inpatient rehabilitation unit.  Patient has a past medical history including bilateral conjunctival inflammation being followed by ophthalmology, hypertension  with tobacco use, obesity.  Patient presented to [facility] on 01/09/2024 with acute onset of left-sided weakness.  MRI showed acute infarcts in the posterior limb of the right internal capsule, overlying right frontal white matter, and pons.  There was also indications of multiple remote infarcts and chronic microvascular ischemic disease.  Neurology was consulted and adjustments made for anticoagulants.  Therapy evaluations were completed and the patient was admitted to CIR due to left-sided weakness and functional mobility deficits.  During today's clinical visit the patient admits struggling with taking care of himself particularly around his metabolic status.  Patient would like to quit smoking and is using the patch on the unit and would like to continue that.  Has requested prescription for nicotine  patch postdischarge.  We spent some time talking about more relatively effective strategy for smoking cessation.  Patient was unaware of previous strokes.  We talked about brain regions involved with his most recent stroke and how they affect motor functioning.  Worked on coping styles and strategies which have affected his medical status including avoidance of dealing with challenges in the moment and procrastination etc.  Patient acknowledges feeling dysphoric/depressed at times but denies significant depression and denies mood disturbance keeping him from participating in therapies.  Patient does have a strong motivation for discharge but now is understanding need for rehab and the fact that this is very beneficial for him if he completes the process.          Electronically Signed  _______________________ Mark Mcintosh, Psy.D. Clinical Neuropsychologist

## 2024-01-20 NOTE — Progress Notes (Signed)
 Occupational Therapy Session Note  Patient Details  Name: Mark Mcintosh MRN: [id number] Date of Birth: 06-24-1971  Today's Date: 01/20/2024 OT Individual Time: 4132-4401 OT Individual Time Calculation (min): 75 min    Short Term Goals: Week 1:  OT Short Term Goal 1 (Week 1): Pt will maintain L UE in safety position during functional tasks and transfers with min A OT Short Term Goal 2 (Week 1): Pt will recall hemi-dressing techniques with min questioning cues OT Short Term Goal 3 (Week 1): Pt will complete toilet transfers with min A using LRAD OT Short Term Goal 4 (Week 1): Pt will complete LB dressing min A using LRAD  Skilled Therapeutic Interventions/Progress Updates:      Pt received in bed ready for therapy.  Focus of therapy session on functional use of LUE and LLE.       ADL Retraining: - pt completed toileting, shower and dressing at EOB - see documentation below Pt able to actively use LUE as an active assist (partially pulled under pants over hips on L side and then was able to fully pull on shorts), crossed legs in figure four to don socks and shoes, needs A to tie shoes    Transfers: -using RW (did not need hand splint) ambulated bed to toilet to shower to bed with CGA   Balance: - standing at toilet to self cleanse using L hand on bar with CGA,  CGA in standing with pulling clothing over hips or standing in shower.   Neuromuscular Re-Education:  -supine in bed, pt has full shoulder flexion AROM -sitting EOB pt has 80% of full sh flexion and 100% using compensatory techniques of elevated scapula. Advised pt to work on sh flex control in smaller range of motion to 90 degrees.   -in gym,  pt sat at mat and lifted a 2 lb dowel bar to 90 degrees with B hands -sit to stands holding bar and then in stand lifting bar to sh height -stand to squats with min A to control descent and cues to push hips back  Pt resting in w/  with all needs met. Alarm set and call light in  reach.    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) Restrictions Weight Bearing Restrictions Per Provider Order: No   Pain: Pain Assessment Pain Score: 0-No pain ADL: ADL Eating: Set up Where Assessed-Eating: Chair Grooming: Independent Where Assessed-Grooming: Sitting at sink Upper Body Bathing: Contact guard Where Assessed-Upper Body Bathing: Shower Lower Body Bathing: Contact guard Where Assessed-Lower Body Bathing: Shower Upper Body Dressing: Supervision/safety Where Assessed-Upper Body Dressing: Edge of bed Lower Body Dressing: Minimal assistance Where Assessed-Lower Body Dressing: Edge of bed Toileting: Contact guard Where Assessed-Toileting: Teacher, adult education: Furniture conservator/restorer Method: Proofreader: Engineer, technical sales: Not assessed Film/video editor: Insurance underwriter Method: Designer, industrial/product: Grab bars  Therapy/Group: Individual Therapy  Decklyn Hornik 01/20/2024, 12:19 PM

## 2024-01-21 DIAGNOSIS — I6350 Cerebral infarction due to unspecified occlusion or stenosis of unspecified cerebral artery: Secondary | ICD-10-CM | POA: Diagnosis not present

## 2024-01-21 LAB — CREATININE, SERUM
Creatinine, Ser: 0.96 mg/dL (ref 0.61–1.24)
GFR, Estimated: >60 mL/min (ref >60)

## 2024-01-21 MED ORDER — ACETAMINOPHEN 325 MG PO TABS: 650.0000 mg | ORAL_TABLET | ORAL | Status: DC | PRN

## 2024-01-21 NOTE — Progress Notes (Signed)
 Speech Language Pathology Daily Session Note  Patient Details  Name: Mark Mcintosh MRN: [id number] Date of Birth: June 10, 1971  Today's Date: 01/21/2024 SLP Individual Time: 4098-1191 SLP Individual Time Calculation (min): 43 min  Short Term Goals: Week 1: SLP Short Term Goal 1 (Week 1): Patient will utilize speech intelligibility strategies to reach 95% intelligibility at the conversational level given supervision verbal A SLP Short Term Goal 2 (Week 1): Patient will utilize safe swallowing strategies during consumption of least restrictive diet given supervision verbal A  Skilled Therapeutic Interventions: Skilled therapy session focused on communication goals. SLP facilitated session by prompting patient to complete x25 repetitions of EMST (expiratory muscle strength training) at 57cm H2O to target respiratory strength for speech. SLP targeted lingual strength through use of IOPI ([place]  Oral Performance Instrument). Patient completed 30 repetitions of 43 kpa anteriorly and 48 kpa posteriorly. Patient was approximately 95% intelligible at the conversational level this date given supervisionA. Patient left in chair with alarm set and call bell in reach. Continue POC.    Pain None reported   Therapy/Group: Individual Therapy  Maahir Horst M.A., CCC-SLP 01/21/2024, 7:57 AM

## 2024-01-21 NOTE — Progress Notes (Signed)
 Physical Therapy Session Note  Patient Details  Name: Mark Mcintosh MRN: [id number] Date of Birth: 04-Mar-1971  Today's Date: 01/21/2024 PT Individual Time: 7829-5621; 1304 - 1407 PT Individual Time Calculation (min): 53 min; 63 min   Short Term Goals: Week 1:  PT Short Term Goal 1 (Week 1): Pt will complete bed mobility with CGA PT Short Term Goal 2 (Week 1): Pt will complete bed<>chair transfers minA with LRAD PT Short Term Goal 3 (Week 1): Pt will ambulate 137ft with minA and LRAD PT Short Term Goal 4 (Week 1): Pt will complete functional outcome measure to assess balance and falls risk  SESSION 1 Skilled Therapeutic Interventions/Progress Updates: Patient supine in bed on entrance to room. Patient alert and agreeable to PT session.   Patient reported no pain during session, and that lat pull down with theraband attached to railing [facility] has decreased shoulder pain.   Therapeutic Activity: Bed Mobility: Pt performed supine<sit on EOB with supervision (bed flat and no hand rails). Transfers: Pt performed sit<>stand transfers throughout session with CGA. Provided VC for increasing step clearance when pivoting.  - chair transfers (without sitting) to work on pivoting and increasing L LE clearance when doing so. Pt cued to shift weight to R when performing pivot step with L, and to back of B knees touch sitting surface  Gait Training:  Pt ambulated roughly 165' using RW with CGA/minA. Pt continues to recall previous cues of weight shifting to R to increase L step clearance, and to bring heel through swing to perform heel-strike. Pt required increase in VC to maintain safe step clearance/hip flexion on L towards end due to fatigue.   Neuromuscular Re-ed: NMR facilitated during session with focus on weight shifting, coordination and maintaining equal step width. - Snake pattern throughout orange cones in RW with overall CGA and light minA occasionally when turning to L with cues to  increase weight shift to R and to increase hip flexion on L. Pt also cued to increase step width when pivoting vs narrow base. PT with seated rest break and performed 2nd trial with improvement in increasing step width/  NMR performed for improvements in motor control and coordination, balance, sequencing, judgement, and self confidence/ efficacy in performing all aspects of mobility at highest level of independence.   Patient sitting in WC at end of session with brakes locked, and all needs within reach.  SESSION 2 Skilled Therapeutic Interventions/Progress Updates: Patient sitting in WC on entrance to room. Patient alert and agreeable to PT session.   Patient with no complaints of pain  Therapeutic Activity: Transfers: Pt performed sit<>stand transfers throughout session with RW and with close supervision.   - Curb navigation (8") with RW and demonstration/education provided for sequence. Pt performed with CGA and good adherence to VC, and ascending with R LE, and descending with L LE.   Gait Training:  Pt ambulated 200'+ on noncompliant surfaces outside of [facility] center using RW with overall CGA. Pt with decreased L LE clearance when fatigued (reports R LE weakness and is what makes it "harder" to maintain L step clearance and weight shift to R).   Neuromuscular Re-ed: NMR facilitated during session with focus on dynamic standing balance, weight shifting, coordination proprioceptive feedback. - Dynamic standing with instructions for pt to bring folded up RW onto bench, and back down with B UE's, and then to touch top back of bench from floor/seat height. Pt performed on slight decline to L (towards pt' paretic side) and did  so with CGA. Pt then cued to touch tree in front above head height with B UE's, and to avoid trying to give self momentum with body to reach with L UE.  - Pt cued to bring cones from one table to the other (less than 10' away) without use of AD and with minA overall and cues  to weight shift and decrease cadence. Pt then cued to ambulate roughly 100' without AD and cues to decrease cadence as pt does not have B UE supported if L LE catches floor. Pt with overall minA and required seated rest break. Pt ambulated another 100' with same level of assistance  NMR performed for improvements in motor control and coordination, balance, sequencing, judgement, and self confidence/ efficacy in performing all aspects of mobility at highest level of independence.   Patient sitting in WC at end of session with brakes locked, and all needs within reach.       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) Restrictions Weight Bearing Restrictions Per Provider Order: No  Therapy/Group: Individual Therapy  Matsue Strom PTA 01/21/2024, 12:59 PM

## 2024-01-21 NOTE — Progress Notes (Signed)
 Refused senokot this am. Last BM 01/20/24-Educated at bedside on medication/purpose. Patient verbalized understanding. Notified Dr. Sharl Davies. No new orders.  Randeen Busman, LPN

## 2024-01-21 NOTE — Patient Care Conference (Signed)
 Inpatient RehabilitationTeam Conference and Plan of Care Update Date: 01/21/2024   Time: 10:06 AM    Patient Name: Mark Mcintosh      Medical Record Number: [id number]  Date of Birth: 1971-05-02 Sex: Male         Room/Bed: 4W10C/4W10C-01 Payor Info: Payor: BLUE CROSS BLUE [employer]IELD / Plan: BCBS COMM PPO / Product Type: *No Product type* /    Admit Date/Time:  01/14/2024  4:12 PM  Primary Diagnosis:  Right pontine cerebrovascular accident [facility])  Hospital Problems: Principal Problem:   Right pontine cerebrovascular accident [facility]) Active Problems:   Coping style affecting medical condition    Expected Discharge Date: Expected Discharge Date: 01/30/24  Team Members Present: Physician leading conference: Dr. Janeece Mechanic Social Worker Present: Norval Been, LCSW Nurse Present: Forrestine Ike, RN PT Present: Seferino Dade, PTA;Oma Bias, PT OT Present: Julia Saguier, OT SLP Present: Reggie Caper, SLP     Current Status/Progress Goal Weekly Team Focus  Bowel/Bladder   Pt continent of bowel and bladder. Last BM 6/3   Pt to remain continent of bowel and bladder   Assist with toileting needs qshift/prn    Swallow/Nutrition/ Hydration   reg/thin   mod i  tolerance, use of strategies    ADL's   supervision UB self care, min A LB self care, CGA toileting and transfers.  Balance and use of LUE improving. now able to use as an active assist.   Mod Ind with toileting, supervision transfers,  supervision LB dressing and bathing   LUE NMR, core strength and balance, safety awareness with balance, ADL training.    Mobility   Bed mobility = supervision ; Transfers = CGA; Ambulation = CGA-minA (modA with turns per L lean and clearance)   supervision/CGA  Family/pt ed, NMRE, ambulation, curb step/stair, standing balance    Communication   mild dysarthria 2/2 lingual and respiratory weakness   mod i 100% conversation   IOPI, EMST, SLOP    Safety/Cognition/  Behavioral Observations               Pain   Pt has intermittent pain   Pt's will be free from pain   Assess pain/admin meds qshift/prn    Skin   Pt skin intact   Pt skin will remain intact  Assess skin qshift/prn and reposition as needed      Discharge Planning:  Pt will discharge to home with PRN support from his sister and mother. SW will confirm there are no barriers to discharge.   Team Discussion: Patient post right pontine CVA with anxiety, impulsivity but no cognitive issues and excellent motor return noted in his arm.  Patient on target to meet rehab goals: yes, currently needs supervision for upper body care and min assist for lower body care with supervision for transfers.  Able to ambulate with CGA - min assist due to difficulty with turning and cues for weight shifting.Goals for discharge set for supervision - CGA and goals for toileting set for mod I.   *See Care Plan and progress notes for long and short-term goals.   Revisions to Treatment Plan:  IOPI ESMT Neuro psych referral   Teaching Needs: Safety, medications, transfers, toileting, smoking cessation and dietary modification, etc.  Current Barriers to Discharge: Decreased caregiver support and Home enviroment access/layout  Possible Resolutions to Barriers: Family education HH follow up services DME: TTB, RW     Medical Summary Current Status: left hemiparesis, adjustment to disability  Barriers to Discharge: Self-care education  Possible Resolutions to [employer] Focus: smoking cessation efforts stroke prevention education   Continued Need for Acute Rehabilitation Level of Care: The patient requires daily medical management by a physician with specialized training in physical medicine and rehabilitation for the following reasons: Direction of a multidisciplinary physical rehabilitation program to maximize functional independence : Yes Medical management of patient stability for  increased activity during participation in an intensive rehabilitation regime.: Yes Analysis of laboratory values and/or radiology reports with any subsequent need for medication adjustment and/or medical intervention. : Yes   I attest that I was present, lead the team conference, and concur with the assessment and plan of the team.   Forrestine Ike B 01/21/2024, 1:26 PM

## 2024-01-21 NOTE — Plan of Care (Signed)
  Problem: Consults Goal: RH STROKE PATIENT EDUCATION Description: See Patient Education module for education specifics  Outcome: Progressing   Problem: RH BOWEL ELIMINATION Goal: RH STG MANAGE BOWEL WITH ASSISTANCE Description: STG Manage Bowel with mod I Assistance. Outcome: Progressing   Problem: RH BLADDER ELIMINATION Goal: RH STG MANAGE BLADDER WITH ASSISTANCE Description: STG Manage Bladder With  mod I Assistance Outcome: Progressing   Problem: RH SKIN INTEGRITY Goal: RH STG SKIN FREE OF INFECTION/BREAKDOWN Description: Manage skin free of infection/breakdown with mod I assistance Outcome: Progressing   Problem: RH SAFETY Goal: RH STG ADHERE TO SAFETY PRECAUTIONS W/ASSISTANCE/DEVICE Description: STG Adhere to Safety Precautions With  mod I Assistance/Device. Outcome: Progressing   Problem: RH KNOWLEDGE DEFICIT Goal: RH STG INCREASE KNOWLEDGE OF HYPERTENSION Description: Manage increase knowledge of hypertension with mod I assistance from friend using educational materials provided Outcome: Progressing Goal: RH STG INCREASE KNOWLEDGE OF STROKE PROPHYLAXIS Description: Manage increase knowledge of stroke prophylaxis with mod I assistance from friend using educational materials provided Outcome: Progressing

## 2024-01-21 NOTE — Progress Notes (Signed)
 Patient ID: Mark Mcintosh, male   DOB: June 09, 1971, 53 y.o.   MRN: [id number]  SW met with pt in room to provide updates from team conference, and d/c date 6/13. He confirms one of his sons will be able to assist him, and he will share the discharge date with them. SW will provide additional updates as available.   Norval Been, MSW, LCSW Office: [phone] Cell: [phone] Fax: [phone]

## 2024-01-21 NOTE — Progress Notes (Signed)
 PROGRESS NOTE   Subjective/Complaints:  No issues overnite, pt without pain, does not like bloodwork , reviewed PT note, amb distance up to 175' yesterday   ROS: Patient denies CP, SOB, N/V/D  Objective:   No results found.  No results for input(s): "WBC", "HGB", "HCT", "PLT" in the last 72 hours.  Recent Labs    01/21/24 0514  CREATININE 0.96     Intake/Output Summary (Last 24 hours) at 01/21/2024 0857 Last data filed at 01/21/2024 0837 Gross per 24 hour  Intake 712 ml  Output 1550 ml  Net -838 ml        Physical Exam: Vital Signs Blood pressure 123/70, pulse 64, temperature 98.3 F (36.8 C), temperature source Oral, resp. rate 18, height 5\' 11"  (1.803 m), weight 98 kg, SpO2 97%.   General: No acute distress Mood and affect are appropriate Heart: Regular rate and rhythm no rubs murmurs or extra sounds Lungs: Clear to auscultation, breathing unlabored, no rales or wheezes Abdomen: Positive bowel sounds, soft nontender to palpation, nondistended Extremities: No clubbing, cyanosis, or edema Skin: No evidence of breakdown, no evidence of rash   Neuro:  pt is alert and oriented x 3. Fair insight and awareness.   Normal language,mild/mod dysarthria.  Left central VII and mild left tongue deviation.  LUE remains 3-/5 prox to distal.  LLE is 4+/5 prox to distal.  Sensory exam normal for light touch and pain in all 4 limbs.  No limb ataxia or cerebellar signs.  No abnormal tone appreciated.    Musculoskeletal: Full ROM, No pain with AROM or PROM in the neck, trunk, or extremities. Posture appropriate   Physical exam unchanged from the above on reexamination 01/21/24    Assessment/Plan: 1. Functional deficits which require 3+ hours per day of interdisciplinary therapy in a comprehensive inpatient rehab setting. Physiatrist is providing close team supervision and 24 hour management of active medical problems  listed below. Physiatrist and rehab team continue to assess barriers to discharge/monitor patient progress toward functional and medical goals  Care Tool:  Bathing    Body parts bathed by patient: Left arm, Chest, Abdomen, Front perineal area, Right upper leg, Left upper leg, Right lower leg, Face, Left lower leg, Right arm, Buttocks   Body parts bathed by helper: Right arm, Buttocks     Bathing assist Assist Level: Contact Guard/Touching assist     Upper Body Dressing/Undressing Upper body dressing   What is the patient wearing?: Pull over shirt    Upper body assist Assist Level: Set up assist    Lower Body Dressing/Undressing Lower body dressing      What is the patient wearing?: Underwear/pull up, Pants     Lower body assist Assist for lower body dressing: Contact Guard/Touching assist     Toileting Toileting    Toileting assist Assist for toileting: Contact Guard/Touching assist     Transfers Chair/bed transfer  Transfers assist     Chair/bed transfer assist level: Contact Guard/Touching assist     Locomotion Ambulation   Ambulation assist      Assist level: Minimal Assistance - Patient > 75% Assistive device: No Device Max distance: 175'   Walk 10  feet activity   Assist     Assist level: Minimal Assistance - Patient > 75% Assistive device: No Device   Walk 50 feet activity   Assist    Assist level: Minimal Assistance - Patient > 75% Assistive device: No Device    Walk 150 feet activity   Assist Walk 150 feet activity did not occur: Safety/medical concerns  Assist level: Minimal Assistance - Patient > 75% Assistive device: No Device    Walk 10 feet on uneven surface  activity   Assist Walk 10 feet on uneven surfaces activity did not occur: Safety/medical concerns         Wheelchair     Assist Is the patient using a wheelchair?: Yes Type of Wheelchair: Manual    Wheelchair assist level: Supervision/Verbal  cueing Max wheelchair distance: 150    Wheelchair 50 feet with 2 turns activity    Assist        Assist Level: Supervision/Verbal cueing   Wheelchair 150 feet activity     Assist      Assist Level: Supervision/Verbal cueing   Blood pressure 123/70, pulse 64, temperature 98.3 F (36.8 C), temperature source Oral, resp. rate 18, height 5\' 11"  (1.803 m), weight 98 kg, SpO2 97%.  Medical Problem List and Plan: 1. Functional deficits secondary to right PLIC, frontal and pontine infarct with TEE showing small PFO no plan for closure or intervention             -patient may shower             -ELOS/Goals:  S 8-12 days            -Continue CIR therapies including PT, OT, and SLP  Team conference today please see physician documentation under team conference tab, met with team  to discuss problems,progress, and goals. Formulized individual treatment plan based on medical history, underlying problem and comorbidities.  2.  Impaired mobility -DVT/anticoagulation:  Pharmaceutical: Lovenox - may d/c due to amb status              -antiplatelet therapy: continue Aspirin  81 mg daily and Plavix  75 mg day x 3 weeks then aspirin  alone   3. Pain Management: Tylenol  as needed   4. Mood/Behavior/Sleep: Provide emotional support             -antipsychotic agents: N/A   5. Neuropsych/cognition: This patient is capable of making decisions on his own behalf.   6. Skin/Wound Care: Routine skin checks 7. Fluids/Electrolytes/Nutrition: pt reports good appetite  -5-31: P.o. intakes 100% for most meals.  Appropriate.   8.  Hypertension.  Continue Norvasc  10 mg daily.  Monitor with increased mobility.    - Added avapro  75mg  daily.   -5/30 improved control. Bp's trending down--obsv only today   - 5-31: Normotensive    01/21/2024    5:44 AM 01/20/2024    7:33 PM 01/20/2024    1:11 PM  Vitals with BMI  Systolic 123 104 161  Diastolic 70 73 77  Pulse 64 71 98  Mild diastolic elevation but  overall controlled   9.  Hyperlipidemia: continue Lipitor   10.  Class II morbid obesity.  BMI 36.26.  Dietary follow-up   11.  Tobacco abuse: continue NicoDerm patch.  Provide counseling   12.  Bilateral conjunctival inflammation.  Continue eyedrops.  Follow-up outpatient Dr.Brasington  - 5-31: Eyedrops expired, reordered.  Primary team may discontinue   13. Screening for vitamin D  deficiency:  vitamin D  level 9  -  5/30 begin vitamin D  1000 u daily  14. Urinary urgency--persistent  No retention   15. Slow transit constipation  5/30-miralax  today and daily prn  -senokot-s 2 tabs at bedtime  -sorbitol  prn  - Last bowel movement 5-31- increase senna to BID - sorbitol  today if no BM     LOS: 7 days A FACE TO FACE EVALUATION WAS PERFORMED  Genetta Kenning 01/21/2024, 8:57 AM

## 2024-01-21 NOTE — Progress Notes (Signed)
 Occupational Therapy Session Note  Patient Details  Name: Mark Mcintosh MRN: [id number] Date of Birth: 08-Jun-1971  Today's Date: 01/21/2024 OT Individual Time: 0865-7846 OT Individual Time Calculation (min): 45 min (unattended estim 1120-1150 )   Short Term Goals: Week 1:  OT Short Term Goal 1 (Week 1): Pt will maintain L UE in safety position during functional tasks and transfers with min A OT Short Term Goal 2 (Week 1): Pt will recall hemi-dressing techniques with min questioning cues OT Short Term Goal 3 (Week 1): Pt will complete toilet transfers with min A using LRAD OT Short Term Goal 4 (Week 1): Pt will complete LB dressing min A using LRAD  Skilled Therapeutic Interventions/Progress Updates:     Pt received in w/c ready for therapy.  Focus of therapy session on LUE NMR.       Transfers: -CGA stand pivot in B directions actively using L foot to step   Neuromuscular Re-Education:  -sh flexion a/arom with using B hands on each side of large hula hoop with dycem for assisting L grip  Pt rotated hoop to achieve sh flex above head and integrate elbow extension  Pushing hoop forward and back with one end of hoop on floor -resisted tube pulls with pt holding handle in left hand pulling on resistance for elbow flex/biceps -maintained grasp well -standing balance with picking up a laundry basket with 9 lbs of weights, pt able to lift basket grasping with B hands on each side of rectangular basket and reach it to above chest level with no LOB  8x  -pt sat to work on grasp and release.  He commented he is not able to fully extend thumb which makes opening his hand difficult to reach for and object -estim placed on forearm for finger/thumb extension. Pt tolerated intensity of 28 with good results.  Had pt focus on extending fingers and thumb with each repetition.   Pt liked the stimulation and requested to keep it on longer.  Pt returned to room and kept estim on for unattended  stimulation with pt continuing to focus on opening his hand.   Pt resting in w/c with all needs met. call light in reach.   Estim removed at 1200 with no adverse affects. Pt able to actively extend thumb post estim.  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) Restrictions Weight Bearing Restrictions Per Provider Order: No Pain: Pain Assessment Pain Score: 0-No pain ADL: ADL Eating: Set up Where Assessed-Eating: Chair Grooming: Independent Where Assessed-Grooming: Sitting at sink Upper Body Bathing: Contact guard Where Assessed-Upper Body Bathing: Shower Lower Body Bathing: Contact guard Where Assessed-Lower Body Bathing: Shower Upper Body Dressing: Supervision/safety Where Assessed-Upper Body Dressing: Edge of bed Lower Body Dressing: Minimal assistance Where Assessed-Lower Body Dressing: Edge of bed Toileting: Contact guard Where Assessed-Toileting: Teacher, adult education: Furniture conservator/restorer Method: Proofreader: Engineer, technical sales: Not assessed Film/video editor: Insurance underwriter Method: Designer, industrial/product: Grab bars   Therapy/Group: Individual Therapy  Temika Sutphin 01/21/2024, 12:42 PM

## 2024-01-22 DIAGNOSIS — I6350 Cerebral infarction due to unspecified occlusion or stenosis of unspecified cerebral artery: Secondary | ICD-10-CM | POA: Diagnosis not present

## 2024-01-22 MED ORDER — SENNOSIDES-DOCUSATE SODIUM 8.6-50 MG PO TABS
2.0000 | ORAL_TABLET | Freq: Every evening | ORAL | Status: DC | PRN
  Administered 2024-01-29: 2 via ORAL
  Filled 2024-01-22: qty 2

## 2024-01-22 NOTE — Progress Notes (Signed)
 PROGRESS NOTE   Subjective/Complaints:  Per OT improved grasp after e stim session  Discussed d/c date  ROS: Patient denies CP, SOB, N/V/D  Objective:   No results found.  No results for input(s): "WBC", "HGB", "HCT", "PLT" in the last 72 hours.  Recent Labs    01/21/24 0514  CREATININE 0.96     Intake/Output Summary (Last 24 hours) at 01/22/2024 0838 Last data filed at 01/22/2024 4098 Gross per 24 hour  Intake 1480 ml  Output 1750 ml  Net -270 ml        Physical Exam: Vital Signs Blood pressure 112/84, pulse 79, temperature 98.4 F (36.9 C), temperature source Oral, resp. rate 18, height 5\' 11"  (1.803 m), weight 98 kg, SpO2 98%.   General: No acute distress Mood and affect are appropriate Heart: Regular rate and rhythm no rubs murmurs or extra sounds Lungs: Clear to auscultation, breathing unlabored, no rales or wheezes Abdomen: Positive bowel sounds, soft nontender to palpation, nondistended Extremities: No clubbing, cyanosis, or edema Skin: No evidence of breakdown, no evidence of rash   Neuro:  pt is alert and oriented x 3. Fair insight and awareness.   Normal language,mild/mod dysarthria.  Left central VII and mild left tongue deviation.  LUE remains 3-/5 prox to distal.  LLE is 4+/5 prox to distal.  Fine motor finger to thumb intact on left but slower than on right  Sensory exam normal for light touch and pain in all 4 limbs.  No limb ataxia or cerebellar signs.  No abnormal tone appreciated.    Musculoskeletal: Full ROM, No pain with AROM or PROM in the neck, trunk, or extremities. Posture appropriate   Physical exam unchanged from the above on reexamination 01/22/24    Assessment/Plan: 1. Functional deficits which require 3+ hours per day of interdisciplinary therapy in a comprehensive inpatient rehab setting. Physiatrist is providing close team supervision and 24 hour management of active  medical problems listed below. Physiatrist and rehab team continue to assess barriers to discharge/monitor patient progress toward functional and medical goals  Care Tool:  Bathing    Body parts bathed by patient: Left arm, Chest, Abdomen, Front perineal area, Right upper leg, Left upper leg, Right lower leg, Face, Left lower leg, Right arm, Buttocks   Body parts bathed by helper: Right arm, Buttocks     Bathing assist Assist Level: Contact Guard/Touching assist     Upper Body Dressing/Undressing Upper body dressing   What is the patient wearing?: Pull over shirt    Upper body assist Assist Level: Set up assist    Lower Body Dressing/Undressing Lower body dressing      What is the patient wearing?: Underwear/pull up, Pants     Lower body assist Assist for lower body dressing: Contact Guard/Touching assist     Toileting Toileting    Toileting assist Assist for toileting: Contact Guard/Touching assist     Transfers Chair/bed transfer  Transfers assist     Chair/bed transfer assist level: Contact Guard/Touching assist     Locomotion Ambulation   Ambulation assist      Assist level: Minimal Assistance - Patient > 75% Assistive device: No Device Max distance:  175'   Walk 10 feet activity   Assist     Assist level: Minimal Assistance - Patient > 75% Assistive device: No Device   Walk 50 feet activity   Assist    Assist level: Minimal Assistance - Patient > 75% Assistive device: No Device    Walk 150 feet activity   Assist Walk 150 feet activity did not occur: Safety/medical concerns  Assist level: Minimal Assistance - Patient > 75% Assistive device: No Device    Walk 10 feet on uneven surface  activity   Assist Walk 10 feet on uneven surfaces activity did not occur: Safety/medical concerns         Wheelchair     Assist Is the patient using a wheelchair?: Yes Type of Wheelchair: Manual    Wheelchair assist level:  Supervision/Verbal cueing Max wheelchair distance: 150    Wheelchair 50 feet with 2 turns activity    Assist        Assist Level: Supervision/Verbal cueing   Wheelchair 150 feet activity     Assist      Assist Level: Supervision/Verbal cueing   Blood pressure 112/84, pulse 79, temperature 98.4 F (36.9 C), temperature source Oral, resp. rate 18, height 5\' 11"  (1.803 m), weight 98 kg, SpO2 98%.  Medical Problem List and Plan: 1. 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  Impaired mobility -DVT/anticoagulation:  Pharmaceutical: Lovenox - may d/c due to amb status              -antiplatelet therapy: continue Aspirin  81 mg daily and Plavix  75 mg day x 3 weeks then aspirin  alone   3. Pain Management: Tylenol  as needed   4. Mood/Behavior/Sleep: Provide emotional support             -antipsychotic agents: N/A   5. Neuropsych/cognition: This patient is capable of making decisions on his own behalf.   6. Skin/Wound Care: Routine skin checks 7. Fluids/Electrolytes/Nutrition: pt reports good appetite  -5-31: P.o. intakes 100% for most meals.  Appropriate.   8.  Hypertension.  Continue Norvasc  10 mg daily.  Monitor with increased mobility.    - Added avapro  75mg  daily.   -5/30 improved control. Bp's trending down--obsv only today   - 5-31: Normotensive    01/22/2024    5:00 AM 01/21/2024    7:36 PM 01/21/2024    2:20 PM  Vitals with BMI  Systolic 112 106 92  Diastolic 84 76 75  Pulse 79 101 100  Mild diastolic elevation but overall controlled   9.  Hyperlipidemia: continue Lipitor   10.  Class II morbid obesity.  BMI 36.26.  Dietary follow-up   11.  Tobacco abuse: continue NicoDerm patch.  Provide counseling   12.  Bilateral  conjunctival inflammation.  Continue eyedrops.  Follow-up outpatient Dr.Brasington  - 5-31: Eyedrops expired, reordered.  Primary team may discontinue   13. Screening for vitamin D  deficiency:  vitamin D  level 9  -5/30 begin vitamin D  1000 u daily  14. Urinary urgency--persistent  No retention   15. Slow transit constipation  Improved had incont BM  will change senna to prn     LOS: 8 days A FACE TO FACE EVALUATION WAS PERFORMED  Genetta Kenning 01/22/2024, 8:38 AM

## 2024-01-22 NOTE — Progress Notes (Signed)
 Occupational Therapy Weekly Progress Note  Patient Details  Name: Mark Mcintosh MRN: [id number] Date of Birth: 08/14/1971  Beginning of progress report period: Jan 15, 2024 End of progress report period: January 22, 2024  Today's Date: 01/22/2024 OT Individual Time: 9629-5284 OT Individual Time Calculation (min): 45 min (unattended estim [phone])   Patient has met 4 of 4 short term goals.  Pt is making excellent progress with balance, LUE functional use. He now has active finger extension and today he is able to isolate his fingers to touch thumb   Patient continues to demonstrate the following deficits: unbalanced muscle activation and decreased coordination and decreased standing balance, hemiplegia, and decreased balance strategies and therefore will continue to benefit from skilled OT intervention to enhance overall performance with BADL, iADL, and Vocation.  Patient progressing toward long term goals..  Plan of care revisions: .Aaron Aas Problem: RH Balance Goal: LTG Patient will maintain dynamic standing with ADLs (OT) Description: LTG:  Patient will maintain dynamic standing balance with assist during activities of daily living (OT)  Flowsheets (Taken 01/22/2024 1207) LTG: Pt will maintain dynamic standing balance during ADLs with: (using a hand support; LTG upgraded due to progress.) Independent with assistive device Note: LTG upgraded due to progress    Problem: Sit to Stand Goal: LTG:  Patient will perform sit to stand in prep for activites of daily living with assistance level (OT) Description: LTG:  Patient will perform sit to stand in prep for activites of daily living with assistance level (OT) Flowsheets (Taken 01/22/2024 1207) LTG: PT will perform sit to stand in prep for activites of daily living with assistance level: (LTG upgraded due to progress) Independent with assistive device Note: LTG upgraded due to progress    Problem: RH Grooming Goal: LTG Patient will perform grooming  w/assist,cues/equip (OT) Description: LTG: Patient will perform grooming with assist, with/without cues using equipment (OT) Flowsheets (Taken 01/22/2024 1207) LTG: Pt will perform grooming with assistance level of: (LTG upgraded due to progress) Independent Note: LTG upgraded due to progress    Problem: RH Bathing Goal: LTG Patient will bathe all body parts with assist levels (OT) Description: LTG: Patient will bathe all body parts with assist levels (OT) Flowsheets (Taken 01/22/2024 1207) LTG: Pt will perform bathing with assistance level/cueing: (LTG upgraded due to progress) Independent with assistive device  Note: LTG upgraded due to progress    Problem: RH Dressing Goal: LTG Patient will perform upper body dressing (OT) Description: LTG Patient will perform upper body dressing with assist, with/without cues (OT). Flowsheets (Taken 01/22/2024 1207) LTG: Pt will perform upper body dressing with assistance level of: (LTG upgraded due to progress) Independent Note: LTG upgraded due to progress  Goal: LTG Patient will perform lower body dressing w/assist (OT) Description: LTG: Patient will perform lower body dressing with assist, with/without cues in positioning using equipment (OT) Flowsheets (Taken 01/22/2024 1207) LTG: Pt will perform lower body dressing with assistance level of: (LTG upgraded due to progress) Independent with assistive device Note: LTG upgraded due to progress    Problem: RH Functional Use of Upper Extremity Goal: LTG Patient will use RT/LT upper extremity as a (OT) Description: LTG: Patient will use right/left upper extremity as a stabilizer/gross assist/diminished/nondominant/dominant level with assist, with/without cues during functional activity (OT) Flowsheets (Taken 01/22/2024 1207) LTG: Use of upper extremity in functional activities: (LTG upgraded due to progress) RUE as diminished level LTG: Pt will use upper extremity in functional activity with assistance level  of: Independent Note: LTG  upgraded due to progress    Problem: RH Toilet Transfers Goal: LTG Patient will perform toilet transfers w/assist (OT) Description: LTG: Patient will perform toilet transfers with assist, with/without cues using equipment (OT) Flowsheets (Taken 01/22/2024 1207) LTG: Pt will perform toilet transfers with assistance level of: (LTG upgraded due to progress) Independent with assistive device Note: LTG upgraded due to progress    Problem: RH Tub/Shower Transfers Goal: LTG Patient will perform tub/shower transfers w/assist (OT) Description: LTG: Patient will perform tub/shower transfers with assist, with/without cues using equipment (OT) Flowsheets (Taken 01/22/2024 1207) LTG: Pt will perform tub/shower stall transfers with assistance level of: (LTG upgraded due to progress) Supervision/Verbal cueing Note: LTG upgraded due to progress     OT Short Term Goals Week 1:  OT Short Term Goal 1 (Week 1): Pt will maintain L UE in safety position during functional tasks and transfers with min A OT Short Term Goal 1 - Progress (Week 1): Met OT Short Term Goal 2 (Week 1): Pt will recall hemi-dressing techniques with min questioning cues OT Short Term Goal 2 - Progress (Week 1): Met OT Short Term Goal 3 (Week 1): Pt will complete toilet transfers with min A using LRAD OT Short Term Goal 3 - Progress (Week 1): Met OT Short Term Goal 4 (Week 1): Pt will complete LB dressing min A using LRAD OT Short Term Goal 4 - Progress (Week 1): Met Week 2:  OT Short Term Goal 1 (Week 2): STGs = LTGs  Skilled Therapeutic Interventions/Progress Updates:     Pt received in w/c ready for therapy.  Focus of therapy session on safe mobility and use of LUE with ADL training. Pt completed toileting, shower, dressing using the L hand as an active assist. Today he was able to touch thumb to each finger and has improved thumb extension to enable him to use hand to grasp to pull pants over hips with min A.       ADL Retraining: ADL Eating: Set up Where Assessed-Eating: Chair Grooming: Independent Where Assessed-Grooming: Sitting at sink Upper Body Bathing: Setup Where Assessed-Upper Body Bathing: Shower Lower Body Bathing: Supervision/safety Where Assessed-Lower Body Bathing: Shower Upper Body Dressing: Independent Where Assessed-Upper Body Dressing: Edge of bed Lower Body Dressing: Contact guard Where Assessed-Lower Body Dressing: Edge of bed Toileting: Supervision/safety Where Assessed-Toileting: Teacher, adult education: Furniture conservator/restorer Method: Proofreader: Engineer, technical sales: Not assessed Film/video editor: Administrator, arts Method: Designer, industrial/product: Grab bars    Balance: -pt able to hold onto grab bar with L hand in standing in shower to use R hand to wash bottom -pt stood with CGA when pulling pants over hips and needs CGA when reaching to floor to L side when donning socks and shoes  Neuromuscular Re-Education:  -applied estim to L forearm for finger/thumb extension 10 sec on/off at intensity 25.   Pt left on for 60 min of unattended estim as pt focused on opening fingers wider with stimulation.   CIR - FULL (Functional Upper Limb Levels)  Levels:  Dependent, Max A, Mod A, Min A, Supervision, Independent, N/A  Proximal Arm Function: Lifts arm to 45 degrees shoulder flexion (to don shirt sleeve)        ______ind___ Lifts arm to 90 degrees shoulder flexion (to wash under arm)       _______ind__ Lifts arm above shoulder height (to touch the back of head)  ____ind_____ Lifts arm to reach hand to opposite shoulder (to pull shirt sleeve)  ____ind_____ Reaches arm behind back (for toileting/ LB dressing skills)            ____ind_____  Distal Function/Hand Grasp; Holds washcloth during bathing                                                     _____ind_____ [employer]  item (ie soap/deoderant) (to open with other hand)  ____ind______ Grasps pants to pull up over thigh/ hip                                           _____min____ Pours glass of water                                                                       _________ [facility] item with both hands (ie tray, basket, box)                            ____supervision_____  Fine motor Hand Function: Uses affected hand to twist cap off of bottle (deoderant, water, soap)   ______ Uses hand to twist off toothpaste cap                                                  ________ Uses hand to hold toothbrush and brush around teeth/dentures         ________ If affected hand is dominant hand for eating, can self feed                  _n/a_______ Uses hand to comb/brush hair                                                             ________ Uses hand to zip up zipper                                                                   ________ Fastens buttons on shirt or pants                                                         ________ [employer]  shoes                                                                                            ____max A____ If affected hand is used for writing, signs name                                   ____n/a____ Uses bilateral hands to cut food (knife in either hand)                         ____max A____    Also discussed smoking and alcohol cessation. Pt is highly motivated to abstain from both to prevent another stroke.  Discussed stress reduction strategies and alternative strategies.     Pt resting in recliner with all needs met. Alarm set and call light in reach.     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) Restrictions Weight Bearing Restrictions Per Provider Order: No    Pain: Pain Assessment Pain Scale: 0-10 Pain Score: 0-No  pain      Therapy/Group: Individual Therapy  Tymarion Everard 01/22/2024, 12:14 PM

## 2024-01-22 NOTE — Plan of Care (Signed)
  Problem: RH Balance Goal: LTG Patient will maintain dynamic standing with ADLs (OT) Description: LTG:  Patient will maintain dynamic standing balance with assist during activities of daily living (OT)  Flowsheets (Taken 01/22/2024 1207) LTG: Pt will maintain dynamic standing balance during ADLs with: (using a hand support; LTG upgraded due to progress.) Independent with assistive device Note: LTG upgraded due to progress    Problem: Sit to Stand Goal: LTG:  Patient will perform sit to stand in prep for activites of daily living with assistance level (OT) Description: LTG:  Patient will perform sit to stand in prep for activites of daily living with assistance level (OT) Flowsheets (Taken 01/22/2024 1207) LTG: PT will perform sit to stand in prep for activites of daily living with assistance level: (LTG upgraded due to progress) Independent with assistive device Note: LTG upgraded due to progress    Problem: RH Grooming Goal: LTG Patient will perform grooming w/assist,cues/equip (OT) Description: LTG: Patient will perform grooming with assist, with/without cues using equipment (OT) Flowsheets (Taken 01/22/2024 1207) LTG: Pt will perform grooming with assistance level of: (LTG upgraded due to progress) Independent Note: LTG upgraded due to progress    Problem: RH Bathing Goal: LTG Patient will bathe all body parts with assist levels (OT) Description: LTG: Patient will bathe all body parts with assist levels (OT) Flowsheets (Taken 01/22/2024 1207) LTG: Pt will perform bathing with assistance level/cueing: (LTG upgraded due to progress) Independent with assistive device  Note: LTG upgraded due to progress    Problem: RH Dressing Goal: LTG Patient will perform upper body dressing (OT) Description: LTG Patient will perform upper body dressing with assist, with/without cues (OT). Flowsheets (Taken 01/22/2024 1207) LTG: Pt will perform upper body dressing with assistance level of: (LTG upgraded due  to progress) Independent Note: LTG upgraded due to progress  Goal: LTG Patient will perform lower body dressing w/assist (OT) Description: LTG: Patient will perform lower body dressing with assist, with/without cues in positioning using equipment (OT) Flowsheets (Taken 01/22/2024 1207) LTG: Pt will perform lower body dressing with assistance level of: (LTG upgraded due to progress) Independent with assistive device Note: LTG upgraded due to progress    Problem: RH Functional Use of Upper Extremity Goal: LTG Patient will use RT/LT upper extremity as a (OT) Description: LTG: Patient will use right/left upper extremity as a stabilizer/gross assist/diminished/nondominant/dominant level with assist, with/without cues during functional activity (OT) Flowsheets (Taken 01/22/2024 1207) LTG: Use of upper extremity in functional activities: (LTG upgraded due to progress) RUE as diminished level LTG: Pt will use upper extremity in functional activity with assistance level of: Independent Note: LTG upgraded due to progress    Problem: RH Toilet Transfers Goal: LTG Patient will perform toilet transfers w/assist (OT) Description: LTG: Patient will perform toilet transfers with assist, with/without cues using equipment (OT) Flowsheets (Taken 01/22/2024 1207) LTG: Pt will perform toilet transfers with assistance level of: (LTG upgraded due to progress) Independent with assistive device Note: LTG upgraded due to progress    Problem: RH Tub/Shower Transfers Goal: LTG Patient will perform tub/shower transfers w/assist (OT) Description: LTG: Patient will perform tub/shower transfers with assist, with/without cues using equipment (OT) Flowsheets (Taken 01/22/2024 1207) LTG: Pt will perform tub/shower stall transfers with assistance level of: (LTG upgraded due to progress) Supervision/Verbal cueing Note: LTG upgraded due to progress

## 2024-01-22 NOTE — Progress Notes (Signed)
 Physical Therapy Session Note  Patient Details  Name: Mark Mcintosh MRN: [id number] Date of Birth: 06-24-1971  Today's Date: 01/22/2024 PT Individual Time: 0800-0830 PT Individual Time Calculation (min): 30 min   Short Term Goals: Week 1:  PT Short Term Goal 1 (Week 1): Pt will complete bed mobility with CGA PT Short Term Goal 2 (Week 1): Pt will complete bed<>chair transfers minA with LRAD PT Short Term Goal 3 (Week 1): Pt will ambulate 135ft with minA and LRAD PT Short Term Goal 4 (Week 1): Pt will complete functional outcome measure to assess balance and falls risk  Skilled Therapeutic Interventions/Progress Updates: Patient sitting in WC on entrance to room. Patient alert and agreeable to PT session.   Patient reported no pain  Therapeutic Activity: Transfers: Pt performed sit<>stand transfers throughout session with supervision and RW.  Gait Training:  Pt ambulated from room<day room gym using RW with CGA and continued improvement to weight shift to R. Pt still requires VC to increase L step clearance to avoid dragging front of shoe on floor. Pt ambulated around nsg/day room loop x 1, and from day room back to room (at end of session) without AD and with  minA (moments of modA due to increase in L lean without UE support). Pt cued to increase reciprocal arm swing.   Neuromuscular Re-ed: - Step to 4" step with 10lb ankle weight donned L LE and no UE support. Pt instructed to only do so with L LE, and to touch step with L heel while maintaining dorsiflexion. Pt performed with mostly CGA/close supervision with one instance that required maxA to prevent LOB to L. 2 rounds close to fatigue - Floor ladder  - side step to each box, then pivot, then toe tap with L LE to orange cone. Pt performed with CGA/minA and cues to increase hip flexion stepping off of cone, and to increase step clearance when side stepping (decreased when stepping to R with L LE following). Pt also cued to increase  pivot clearance with L LE.  NMR performed for improvements in motor control and coordination, balance, sequencing, judgement, and self confidence/ efficacy in performing all aspects of mobility at highest level of independence.   Patient hand off to OT at end of session.      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) Restrictions Weight Bearing Restrictions Per Provider Order: No Therapy/Group: Individual Therapy  Jasalyn Frysinger A Maui Ahart 01/22/2024, 12:48 PM

## 2024-01-22 NOTE — Progress Notes (Signed)
 Occupational Therapy Session Note  Patient Details  Name: Mark Mcintosh MRN: [id number] Date of Birth: 1970/09/01  Today's Date: 01/22/2024 OT Individual Time: 1133-1202+ 1350-1502 OT Individual Time Calculation (min): 29 min    Short Term Goals: Week 1:  OT Short Term Goal 1 (Week 1): Pt will maintain L UE in safety position during functional tasks and transfers with min A OT Short Term Goal 1 - Progress (Week 1): Met OT Short Term Goal 2 (Week 1): Pt will recall hemi-dressing techniques with min questioning cues OT Short Term Goal 2 - Progress (Week 1): Met OT Short Term Goal 3 (Week 1): Pt will complete toilet transfers with min A using LRAD OT Short Term Goal 3 - Progress (Week 1): Met OT Short Term Goal 4 (Week 1): Pt will complete LB dressing min A using LRAD OT Short Term Goal 4 - Progress (Week 1): Met  Skilled Therapeutic Interventions/Progress Updates:  Session 1: Pt greeted seated in recliner, pt agreeable to OT intervention.      Transfers/bed mobility/functional mobility: pt completed stand pivot to w/c with no AD and MINA.   Therapeutic activity:  Pt completed various FMC tasks to facilitate improved pincer grasp for higher level ADL tasks. MOD cues needed to keep elbow on table to allow for proximal support. Emphasis on improving proprioception and motor planning by creating reactive challenges with pt having to grasp small pegs and place then on reactive surfaces, pt needed + time and MOD cues for set- up and technique.   Pt also able to grasp cube blocks and stack blocks on top of each other, graded task up and had pt stack blocks on higher surface to challenge shoulder stability.   Ended session with pt seated in w/c with all needs within reach.                Session 2: Pt greeted seated in w/c, pt agreeable to OT intervention.      Therapeutic activity: pt completed various therapeutic activities focused on LUE coordination, motor planning and  proprioception through various FMC tasks: -pt completed seated "shoe tying" task with pt instructed to tie a lace positioned around a yoga block to simulate tying the drawstring on his pants. Pt did best with the double "ear" hemi method to complete tying task. Pt completes task 3x with + time and supervision with MOD verbal/visual cues for set- up and technique.  -pt completed seated bimanual task to facilitate improved distal coordination for ADL participation with pt instructed to grasp playing card with LUE and use RUE to clip card to vertical board. Pt completed task with + time and supervision. Graded task up and had pt complete task in reverse I.e using weighted clothespin in LUE and removing card with RUE. Pt does best with elbow supported on table to promote proximal support. -pt completed functional ambulation task with pt instructed to ambulate ~ 6 ft to transport matching cards with pt instructed to hold playing card in L hand to promote isometric strength/control in affected hand. Pt completed functional ambulation with no AD and MINA, pt did impulsively reach to floor to retrieve a dropped card.  -pt completed seated grasping task with pt given level 2 weighted clothespin in LUE with pt instructed to use clothespin to retrieve pegs from peg board to increase grip strength for ADL participation. Pt completed task with supervision and mIN cues to keep L elbow supported on table and for digit positioning on clothespin.  -pt complete  seated FMC grasping task with pt instructed to use pincer grasp to retrieve rings with LUE and place rings over top of vertical pegs to challenge wrist control and improve intrinsic strength and control. Pt completed task with + time and supervision with MIN verbal cues for technique.     NMR:  Pt completed x10 reps of modified push ups with pt standing in front of mat table for UB strengthening and to promote NMRE LUE. Pt completed task with MIN cues for set- up and  technique and MIN support at shoulder girdle for optimal joint stability.   Pt completed functional reaching task with pt instructed to reach to floor to retrieve cards from floor level with RUE while LUE was supported on mat table to provide weightbearing to affected UE for NMRE. Pt completed task with MIN A for balance.                   Ended session with pt seated in w/c with all needs within reach.               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

## 2024-01-22 NOTE — Progress Notes (Signed)
 Speech Language Pathology Daily Session Note  Patient Details  Name: Mark Mcintosh MRN: [id number] Date of Birth: 1971-01-29  Today's Date: 01/22/2024 SLP Individual Time: 1030-1058 SLP Individual Time Calculation (min): 28 min  Short Term Goals: Week 1: SLP Short Term Goal 1 (Week 1): Patient will utilize speech intelligibility strategies to reach 95% intelligibility at the conversational level given supervision verbal A SLP Short Term Goal 2 (Week 1): Patient will utilize safe swallowing strategies during consumption of least restrictive diet given supervision verbal A  Skilled Therapeutic Interventions: Skilled therapy session focused on communication goals. SLP facilitated session by prompting patient to complete x35 repetitions of EMST (expiratory muscle strength training) at 60cm H2O to target respiratory strength for speech. SLP targeted lingual strength through use of IOPI ([place]  Oral Performance Instrument). Patient completed 30 repetitions of 43 kpa anteriorly and 45-48 kpa posteriorly. Patient was approximately 95% intelligible at the conversational level this date given supervisionA. Patient left in chair with alarm set and call bell in reach. Continue POC.   Pain Denies  Therapy/Group: Individual Therapy  Coron Rossano M.A., CCC-SLP 01/22/2024, 7:39 AM

## 2024-01-23 ENCOUNTER — Ambulatory Visit: Admitting: Physician Assistant

## 2024-01-23 MED ORDER — PREDNISOLONE ACETATE 1 % OP SUSP
1.0000 [drp] | Freq: Four times a day (QID) | OPHTHALMIC | Status: DC
  Administered 2024-01-23 – 2024-01-30 (×28): 1 [drp] via OPHTHALMIC
  Filled 2024-01-23: qty 5

## 2024-01-23 NOTE — Progress Notes (Signed)
 Physical Therapy Session Note  Patient Details  Name: Mark Mcintosh MRN: [id number] Date of Birth: 28-Jan-1971  Today's Date: 01/23/2024 PT Individual Time: 1007-1035; 1338 - 1418 PT Individual Time Calculation (min): 28 min   Short Term Goals: Week 1:  PT Short Term Goal 1 (Week 1): Pt will complete bed mobility with CGA PT Short Term Goal 2 (Week 1): Pt will complete bed<>chair transfers minA with LRAD PT Short Term Goal 3 (Week 1): Pt will ambulate 122ft with minA and LRAD PT Short Term Goal 4 (Week 1): Pt will complete functional outcome measure to assess balance and falls risk  SESSION 1 Skilled Therapeutic Interventions/Progress Updates: Patient sitting EOB on entrance to room. Patient alert and agreeable to PT session.   Patient reported 4/10 pain in R low back and stated he might have slept on it wrong. PTA provided manual therapy noted bellow with pt reporting no pain following and thankful for intervention. Pt encouraged to perform piriformis stretch supine in bed when feeling tight.  Therapeutic Activity: Bed Mobility: Pt sit<>supine with supervision and VC for sequence to transition to side, then use R UE for truncal elevation while advancing B LE off of mat. Pt educated to perform this to avoid straining low back.  Transfers: Pt performed sit<>stand transfers throughout session with supervision.  Patient demonstrates increased fall risk as noted by score of 34/56 on Berg Balance Scale.  (<36= high risk for falls, close to 100%; 37-45 significant >80%; 46-51 moderate >50%; 52-55 lower >25%)  - Pt ambulated from room<day room gym in RW with CGA and VC to increase R weight shift for L LE clearance, and to bring heel through to perform heel strike. Pt also cued to decrease cadence to improve safety while ambulating as pt's L front of shoe occasionally catches floor when distractions increase.   Therapeutic Exercise: Pt performed the following exercises with therapist providing  the described cuing and facilitation for improvement. - Supine piriformis stretch with cues for technique  Manual Therapy: Palpation of R glute performed with trigger points noted. Education and rationale provided with pt agreeing to participate in intervention. - Trigger point release to piriformis with soft tissue mobilization to follow throughout. Pt L sidelying on hi/low mat.   Patient sitting edge of mat in day room gym with hand off to OT at end of session.  SESSION 2 Skilled Therapeutic Interventions/Progress Updates: Patient sitting in WC on entrance to room. Patient alert and agreeable to PT session.   Patient reported no pain and that low back on R feels great after manual therapy during morning session.   Therapeutic Activity: Transfers: Pt performed sit<>stand transfers throughout session in preparation to ambulate with supervision and in RW.  - Pt ambulated from room<>day room gym in RW with CGA and VC to bring L heel through to heel-strike, and to increase weight shift to R to improve L LE step clearance.  Neuromuscular Re-ed: - Floor ladder with pt stepping laterally and tapping orange cone with R LE only to improve proprioceptive feedback/WB on L LE. Pt cued to maintain greater BOS vs narrow presentation. Pt with improved step clearance this session vs yesterday's session performing same task. 2 rounds with rest break provided.  - Side stepping with green theraband donned B ankles with cues to increase step length and to control eccentric on contralateral LE. Pt also performed retro-steps with same cues and minA throughout - Ambulating around day room gym without AD and cues to pick up cones  on various heights (waist to floor) outside BOS per pt report of almost losing balance in OT session grabbing towel. Pt minA throughout.  NMR performed for improvements in motor control and coordination, balance, sequencing, judgement, and self confidence/ efficacy in performing all  aspects of mobility at highest level of independence.   Patient sitting in WC at end of session with brakes locked, and all needs within reach.       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) Restrictions Weight Bearing Restrictions Per Provider Order: No  Therapy/Group: Individual Therapy  Samson Ralph PTA 01/23/2024, 1:02 PM

## 2024-01-23 NOTE — Progress Notes (Shared)
 Physical Therapy Weekly Progress Note  Patient Details  Name: Mark Mcintosh MRN: [id number] Date of Birth: [birth year]  Beginning of progress report period: Jan 15, 2024 End of progress report period: January 23, 2024  Patient has met 4 of 4 short term goals. Pt is making functional progress towards LTG's. Pt currently ambulates with RW and CGA (still requires VC to maintain weight shift to R and to increase L step clearance). Pt has completed BERG balance score shortly after admission (17/56) and has improved score to 34/56. Pt is making improvements in functional mobility, and will need to have LTG's upgraded to accurately mirror pt's anticipated level of function at d/c. ***  Patient continues to demonstrate the following deficits {impairments:3041632} and therefore will continue to benefit from skilled PT intervention to increase functional independence with mobility.  Patient {LTG progression:3041653}.  {plan of WUJW:1191478}  PT Short Term Goals Week 1:  PT Short Term Goal 1 (Week 1): Pt will complete bed mobility with CGA PT Short Term Goal 1 - Progress (Week 1): Met PT Short Term Goal 2 (Week 1): Pt will complete bed<>chair transfers minA with LRAD PT Short Term Goal 2 - Progress (Week 1): Met PT Short Term Goal 3 (Week 1): Pt will ambulate 130ft with minA and LRAD PT Short Term Goal 3 - Progress (Week 1): Met PT Short Term Goal 4 (Week 1): Pt will complete functional outcome measure to assess balance and falls risk PT Short Term Goal 4 - Progress (Week 1): Met  Skilled Therapeutic Interventions/Progress Updates:      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

## 2024-01-23 NOTE — Progress Notes (Signed)
 Speech Language Pathology Weekly Progress and Session Note  Patient Details  Name: Mark Mcintosh MRN: [id number] Date of Birth: [birth year]  Beginning of progress report period: Jan 17, 2024 End of progress report period: January 23, 2024  Today's Date: 01/23/2024 SLP Individual Time: 8469-6295 SLP Individual Time Calculation (min): 50 min  Short Term Goals: Week 1: SLP Short Term Goal 1 (Week 1): Patient will utilize speech intelligibility strategies to reach 95% intelligibility at the conversational level given supervision verbal A SLP Short Term Goal 1 - Progress (Week 1): Met SLP Short Term Goal 2 (Week 1): Patient will utilize safe swallowing strategies during consumption of least restrictive diet given supervision verbal A SLP Short Term Goal 2 - Progress (Week 1): Met  New Short Term Goals: Week 2: SLP Short Term Goal 1 (Week 2): STGs=LTGs d/t ELOS  Weekly Progress Updates: Patient has made excellent progress throughout rehabilitation admission meeting 2/2 short term goals set this reporting period. Patient currently benefits from overall supervision assist to utilize speech intelligibility and swallowing strategies. Patient is completing EMST and IOPI to improve musculature for speech and swallowing with supervision. Patient can require up to min assist to achieve 95% accuracy during complex conversation activities. Patient and family education ongoing. Patient will continue to benefit from skilled therapy services during remainder of CIR stay.     Intensity: Minumum of 1-2 x/day, 30 to 90 minutes Frequency: 1 to 3 out of 7 days Duration/Length of Stay: 6/13 Treatment/Interventions: Dysphagia/aspiration precaution training;Internal/external aids;Speech/Language facilitation;Therapeutic Activities;Functional tasks;Multimodal communication approach;Patient/family education  Daily Session  Skilled Therapeutic Interventions: SLP facilitated session targeting dysphagia and dysarthria by  prompting patient to complete x25 repetitions of EMST (expiratory muscle strength training) at 60cm H2O to target respiratory strength for speech. SLP targeted lingual strength through use of IOPI ([place]  Oral Performance Instrument). Patient completed 30 repetitions of 43 kpa anteriorly and 46 kpa posteriorly. Patient was approximately 95% intelligible at the conversational level this date given supervisionA. SLP increased challenge of conversation-level speech by providing patient with tongue twister practice. Patient benefited from min assist during this task to articulate consonants at the end of words. During session, patient tolerated regular/thin liquids with no overt s/sx of penetration/aspiration. Patient independently states and utilizes swallow safety strategies. Patient left in chair with alarm set and call bell in reach. Continue POC.       Pain None endorsed  Therapy/Group: Individual Therapy  Dorla Gartner, M.A., CCC-SLP  [name] 01/23/2024, 12:12 PM

## 2024-01-23 NOTE — Progress Notes (Signed)
 Occupational Therapy Session Note  Patient Details  Name: Mark Mcintosh MRN: [id number] Date of Birth: [birth year]  Today's Date: 01/23/2024 OT Individual Time: 1035-1100 OT Individual Time Calculation (min): 25 min    Short Term Goals: Week 2:  OT Short Term Goal 1 (Week 2): STGs = LTGs  Skilled Therapeutic Interventions/Progress Updates:    Pt requested to shower. Ambulated in room from hand off from PT with RW.  Pt able to complete all self care with close supervision and occasional cues to ensure safe L foot placement. Pt using L hand as an active assist.   Pt resting in w.c with handoff to speech therapy.   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) Restrictions Weight Bearing Restrictions Per Provider Order: No  Vital Signs: Therapy Vitals Temp: 98.4 F (36.9 C) Temp Source: Oral Pulse Rate: 97 Resp: 19 BP: 93/71 Patient Position (if appropriate): Sitting Oxygen  Therapy SpO2: 96 % O2 Device: Room Air Pain: Pain Assessment Pain Scale: 0-10 Pain Score: 0-No pain Pain Type: Acute pain Pain Location: Back Pain Orientation: Posterior Pain Descriptors / Indicators: Aching;Discomfort;Grimacing Pain Frequency: Intermittent Pain Onset: With Activity Pain Intervention(s): Medication (See eMAR);Environmental changes;Emotional support Multiple Pain Sites: No ADL: ADL Eating: Set up Where Assessed-Eating: Chair Grooming: Independent Where Assessed-Grooming: Sitting at sink Upper Body Bathing: Setup Where Assessed-Upper Body Bathing: Shower Lower Body Bathing: Supervision/safety Where Assessed-Lower Body Bathing: Shower Upper Body Dressing: Independent Where Assessed-Upper Body Dressing: Edge of bed Lower Body Dressing: Supervision/safety Where Assessed-Lower Body Dressing: Edge of bed Toileting: Supervision/safety Where Assessed-Toileting:  Teacher, adult education: Close supervision Toilet Transfer Method: Proofreader: Engineer, technical sales: Not assessed Film/video editor: Close supervision Film/video editor Method: Designer, industrial/product: Grab bars   Therapy/Group: Individual Therapy  Debrina Kizer 01/23/2024, 1:10 PM

## 2024-01-23 NOTE — Progress Notes (Signed)
 Occupational Therapy Session Note  Patient Details  Name: Mark Mcintosh MRN: [id number] Date of Birth: July 18, 1971  Today's Date: 01/23/2024 OT Individual Time: 4098-1191 OT Individual Time Calculation (min): 67 min    Short Term Goals: Week 2:  OT Short Term Goal 1 (Week 2): STGs = LTGs  Skilled Therapeutic Interventions/Progress Updates:  Pt greeted supine in bed, pt agreeable to OT intervention.      Transfers/bed mobility/functional mobility: pt completed supine>sit with CGA. Pt attempted stand pivot to w/c but had a posterior LOB with pt falling back on bed. Pt reports it was d/t back pain.   Therapeutic activity: pt completed various therapeutic activities focused on improving LUE FMC, motor planning and proprioception:  -pt instructed to grasp small Rings using pincer grasp to don rings on cones with a focus on improving [facility] for ADL participation. Pt completed task with + time and supervision.  - pt instructed to Flip  over cones with a focus on improving cylindrical grasp and internal/external shoulder rotation to simulate flipping over cups when putting cups in cabinets. Pt completed tasks with + time and supervision with MIN cues for sequencing and technique.  -pt instructed to grasp Bean bags with LUE and balance bags on top of cones with elbow supported on table for proximal support, emphasis on improving composite digit flexion/extension and improving proprioception in LUE.   - pt completed Standing reaching task with pt instructed to reach to mirror to place/remove  squigz with LUE with a focus on targeted reaching and composite digit flexion/extension. Pt with more difficulty reaching LUE up against gravity.     ADLs:  Grooming: pt completed standing grooming at sink with supervision.  Transfers: pt completed ambulatory transfer to TTB in tub room with Rw and CGA. Pt needed MIN verbal cues for sequencing and technique. Education provided on recommendation to lateral lean  for pericare vs standing at pt does not have grab bars. Pt completed ambulatory transfer into bathroom with Rw and CGA.  Toileting: pt with continent urine void completing 3/3 toileting tasks with supervision.  Self feeding:    Ended session with pt seated in w/c with all needs within reach.            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) Restrictions Weight Bearing Restrictions Per Provider Order: No  Pain: unrated pain reported in R glute, heat provided as needed.     Therapy/Group: Individual Therapy  [name] 01/23/2024, 12:12 PM

## 2024-01-23 NOTE — Progress Notes (Addendum)
 PROGRESS NOTE   Subjective/Complaints:  Patient seen in therapy gym he complains of redness of his eyes particular the right side.  He states he had this problem prior to his stroke.  Reviewed ED notes from [facility] on 12/29/2023.  ED visit for eye redness.  Patient was seen by Dr.Brasington, ophthalmology examined.  Also had fluorescein  test.  This was negative.  The ophthalmology examination was negative and the patient was felt to have some inflammation and was placed on prednisolone  1% ophthalmic suspension. He currently has no eye pain some mild blurring, no drainage  ROS: Patient denies CP, SOB, N/V/D  Objective:   No results found.  No results for input(s): "WBC", "HGB", "HCT", "PLT" in the last 72 hours.  Recent Labs    01/21/24 0514  CREATININE 0.96     Intake/Output Summary (Last 24 hours) at 01/23/2024 0906 Last data filed at 01/23/2024 0720 Gross per 24 hour  Intake 1220 ml  Output 1750 ml  Net -530 ml        Physical Exam: Vital Signs Blood pressure 108/69, pulse 85, temperature 98.1 F (36.7 C), temperature source Oral, resp. rate 18, height 5\' 11"  (1.803 m), weight 98 kg, SpO2 96%.  HEENT.  Right eye injected no scleral edema, no drainage or crusting about the eye.  Normal extraocular muscle movement.  Normal lid closure General: No acute distress Mood and affect are appropriate Heart: Regular rate and rhythm no rubs murmurs or extra sounds Lungs: Clear to auscultation, breathing unlabored, no rales or wheezes Abdomen: Positive bowel sounds, soft nontender to palpation, nondistended Extremities: No clubbing, cyanosis, or edema Skin: No evidence of breakdown, no evidence of rash   Neuro:  pt is alert and oriented x 3. Fair insight and awareness.   Normal language,mild/mod dysarthria.  Left central VII and mild left tongue deviation.  LUE remains 3-/5 prox to distal.  LLE is  4+/5 prox to distal.  Fine motor finger to thumb intact on left but slower than on right  Sensory exam normal for light touch and pain in all 4 limbs.  No limb ataxia or cerebellar signs.  No abnormal tone appreciated.    Musculoskeletal: Full ROM, No pain with AROM or PROM in the neck, trunk, or extremities. Posture appropriate   Physical exam unchanged from the above on reexamination 01/23/24    Assessment/Plan: 1. Functional deficits which require 3+ hours per day of interdisciplinary therapy in a comprehensive inpatient rehab setting. Physiatrist is providing close team supervision and 24 hour management of active medical problems listed below. Physiatrist and rehab team continue to assess barriers to discharge/monitor patient progress toward functional and medical goals  Care Tool:  Bathing    Body parts bathed by patient: Left arm, Chest, Abdomen, Front perineal area, Right upper leg, Left upper leg, Right lower leg, Face, Left lower leg, Right arm, Buttocks   Body parts bathed by helper: Right arm, Buttocks     Bathing assist Assist Level: Supervision/Verbal cueing     Upper Body Dressing/Undressing Upper body dressing   What is the patient wearing?: Pull over shirt    Upper body assist Assist Level: Independent  Lower Body Dressing/Undressing Lower body dressing      What is the patient wearing?: Underwear/pull up, Pants     Lower body assist Assist for lower body dressing: Contact Guard/Touching assist     Toileting Toileting    Toileting assist Assist for toileting: Supervision/Verbal cueing     Transfers Chair/bed transfer  Transfers assist     Chair/bed transfer assist level: Contact Guard/Touching assist     Locomotion Ambulation   Ambulation assist      Assist level: Minimal Assistance - Patient > 75% Assistive device: No Device Max distance: 175'   Walk 10 feet activity   Assist     Assist level: Minimal Assistance - Patient  > 75% Assistive device: No Device   Walk 50 feet activity   Assist    Assist level: Minimal Assistance - Patient > 75% Assistive device: No Device    Walk 150 feet activity   Assist Walk 150 feet activity did not occur: Safety/medical concerns  Assist level: Minimal Assistance - Patient > 75% Assistive device: No Device    Walk 10 feet on uneven surface  activity   Assist Walk 10 feet on uneven surfaces activity did not occur: Safety/medical concerns         Wheelchair     Assist Is the patient using a wheelchair?: Yes Type of Wheelchair: Manual    Wheelchair assist level: Supervision/Verbal cueing Max wheelchair distance: 150    Wheelchair 50 feet with 2 turns activity    Assist        Assist Level: Supervision/Verbal cueing   Wheelchair 150 feet activity     Assist      Assist Level: Supervision/Verbal cueing   Blood pressure 108/69, pulse 85, temperature 98.1 F (36.7 C), temperature source Oral, resp. rate 18, height 5\' 11"  (1.803 m), weight 98 kg, SpO2 96%.  Medical Problem List and Plan: 1. 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  Impaired mobility -DVT/anticoagulation:  Pharmaceutical: Lovenox - may d/c due to amb status              -antiplatelet therapy: continue Aspirin  81 mg daily and Plavix  75 mg day x 3 weeks then aspirin  alone   3. Pain Management: Tylenol  as needed   4. Mood/Behavior/Sleep: Provide emotional support             -antipsychotic agents: N/A   5. Neuropsych/cognition: This patient is capable of making decisions on his own behalf.   6. Skin/Wound Care: Routine skin checks 7. Fluids/Electrolytes/Nutrition: pt reports good appetite  -5-31: P.o. intakes 100% for  most meals.  Appropriate.   8.  Hypertension.  Continue Norvasc  10 mg daily.  Monitor with increased mobility.    - Added avapro  75mg  daily.   -5/30 improved control. Bp's trending down--obsv only today   - 5-31: Normotensive    01/23/2024    6:06 AM 01/22/2024    7:53 PM 01/22/2024    4:27 PM  Vitals with BMI  Systolic 108 112 409  Diastolic 69 84 75  Pulse 85 88 80  Mild diastolic elevation  but overall controlled   9.  Hyperlipidemia: continue Lipitor   10.  Class II morbid obesity.  BMI 36.26.  Dietary follow-up   11.  Tobacco abuse: continue NicoDerm patch.  Provide counseling   12.  Bilateral conjunctival inflammation.  Continue eyedrops.  Follow-up outpatient Dr.Brasington  Will resume prednisolone  ophthalmic solution.  Orbital CT on 12/29/2023 negative   13. Screening for vitamin D  deficiency:  vitamin D  level 9  -5/30 begin vitamin D  1000 u daily  14. Urinary urgency--persistent  No retention   15. Slow transit constipation  Improved had incont BM will change senna to prn     LOS: 9 days A FACE TO FACE EVALUATION WAS PERFORMED  Genetta Kenning 01/23/2024, 9:06 AM

## 2024-01-24 MED ORDER — AMLODIPINE BESYLATE 5 MG PO TABS
5.0000 mg | ORAL_TABLET | Freq: Every day | ORAL | Status: DC
  Administered 2024-01-24 – 2024-01-28 (×5): 5 mg via ORAL
  Filled 2024-01-24 (×5): qty 1

## 2024-01-24 MED ORDER — AMLODIPINE BESYLATE 5 MG PO TABS: 5.0000 mg | ORAL_TABLET | Freq: Every day | ORAL | Status: DC

## 2024-01-24 NOTE — Plan of Care (Signed)
  Problem: Consults Goal: RH STROKE PATIENT EDUCATION Description: See Patient Education module for education specifics  Outcome: Progressing   Problem: RH BOWEL ELIMINATION Goal: RH STG MANAGE BOWEL WITH ASSISTANCE Description: STG Manage Bowel with mod I Assistance. Outcome: Progressing   Problem: RH BLADDER ELIMINATION Goal: RH STG MANAGE BLADDER WITH ASSISTANCE Description: STG Manage Bladder With  mod I Assistance Outcome: Progressing   Problem: RH SKIN INTEGRITY Goal: RH STG SKIN FREE OF INFECTION/BREAKDOWN Description: Manage skin free of infection/breakdown with mod I assistance Outcome: Progressing   Problem: RH SAFETY Goal: RH STG ADHERE TO SAFETY PRECAUTIONS W/ASSISTANCE/DEVICE Description: STG Adhere to Safety Precautions With  mod I Assistance/Device. Outcome: Progressing   Problem: RH PAIN MANAGEMENT Goal: RH STG PAIN MANAGED AT OR BELOW PT'S PAIN GOAL Description: <4 w/ prns Outcome: Progressing   Problem: RH KNOWLEDGE DEFICIT Goal: RH STG INCREASE KNOWLEDGE OF HYPERTENSION Description: Manage increase knowledge of hypertension with mod I assistance from friend using educational materials provided Outcome: Progressing Goal: RH STG INCREASE KNOWLEDGE OF STROKE PROPHYLAXIS Description: Manage increase knowledge of stroke prophylaxis with mod I assistance from friend using educational materials provided Outcome: Progressing

## 2024-01-24 NOTE — Plan of Care (Signed)
  Problem: Consults Goal: RH STROKE PATIENT EDUCATION Description: See Patient Education module for education specifics  Outcome: Progressing   Problem: RH BOWEL ELIMINATION Goal: RH STG MANAGE BOWEL WITH ASSISTANCE Description: STG Manage Bowel with  mod I Assistance. Outcome: Progressing   Problem: RH BLADDER ELIMINATION Goal: RH STG MANAGE BLADDER WITH ASSISTANCE Description: STG Manage Bladder With  mod I Assistance Outcome: Progressing   Problem: RH SKIN INTEGRITY Goal: RH STG SKIN FREE OF INFECTION/BREAKDOWN Description: Manage skin free of infection/breakdown with mod I assistance Outcome: Progressing   Problem: RH SAFETY Goal: RH STG ADHERE TO SAFETY PRECAUTIONS W/ASSISTANCE/DEVICE Description: STG Adhere to Safety Precautions With  mod I Assistance/Device. Outcome: Progressing

## 2024-01-24 NOTE — Progress Notes (Signed)
 Physical Therapy Session Note  Patient Details  Name: Mark Mcintosh MRN: [id number] Date of Birth: 08-02-1971  Today's Date: 01/24/2024 PT Individual Time: 1132-1202 PT Individual Time Calculation (min): 30 min   Short Term Goals: Week 1:  PT Short Term Goal 1 (Week 1): Pt will complete bed mobility with CGA PT Short Term Goal 1 - Progress (Week 1): Met PT Short Term Goal 2 (Week 1): Pt will complete bed<>chair transfers minA with LRAD PT Short Term Goal 2 - Progress (Week 1): Met PT Short Term Goal 3 (Week 1): Pt will ambulate 181ft with minA and LRAD PT Short Term Goal 3 - Progress (Week 1): Met PT Short Term Goal 4 (Week 1): Pt will complete functional outcome measure to assess balance and falls risk PT Short Term Goal 4 - Progress (Week 1): Met  Skilled Therapeutic Interventions/Progress Updates: Patient supine in bed on entrance to room. Patient alert and agreeable to unscheduled PT session.   Patient reported no pain. Pt performed transfers with RW with modI during session. Pt transported to [facility] to ambulate on non compliant surfaces with RW and did so with overall CGA and improved L step clearance this session vs previous that required less cuing (ambulated greater than 300') and navigated cracks in sidewalk and small mulch bits that got trapped on RW without incident. Pt transported back to main gym in [facility]. Pt reported desire to ambulate at home after d/c, but worried about RW presentation on road. PTA introduced rollator with pt managing parts with supervision, and ambulated from main gym<room with close supervision and stated liking this AD vs RW. Pt requested to void bladder and ambulated in room distance without AD and CGA and performed self hygiene with supervision for safety. Pt thankful for pop up PT session.  Patient sitting in WC at end of session with brakes locked, and all needs within reach.      Therapy Documentation Precautions:  Precautions Precautions:  Fall Recall of Precautions/Restrictions: Impaired Precaution/Restrictions Comments: L hemi (UE > LE) Restrictions Weight Bearing Restrictions Per Provider Order: No  Therapy/Group: Individual Therapy  Kennley Schwandt PTA 01/24/2024, 12:23 PM

## 2024-01-24 NOTE — Progress Notes (Signed)
 Occupational Therapy Session Note  Patient Details  Name: Azavier Creson MRN: [id number] Date of Birth: [birth year]  Today's Date: 01/24/2024 OT Individual Time: 0920-1005 OT Individual Time Calculation (min): 45 min    Short Term Goals: Week 2:  OT Short Term Goal 1 (Week 2): STGs = LTGs  Skilled Therapeutic Interventions/Progress Updates:     Pt received in bed ready for therapy.     ADL Retraining: - see ADL documentation below. Pt supervision with all tasks including standing in shower using grab bar for stability.  Therapeutic Activity/ Exercise: -ambulation to main gym with RW and continued entire session with no seated rest breaks to challenge his endurance  Transfers: -supervision with RW and occasional CGA in ambulation as he continues to catch L toe intermittently  Balance: -supervision with dynamic balance with pulling up tight underwear  Neuromuscular Re-Education:  -in gym stood in parallel bars with weight bearing through L hand on bar as he practiced stepping left foot forward and back to a target leading with his toe 10x, full foot 10 x and then his heel 10 x2 challenging his dorsiflexion.  -in bars shifted wt to L to flex knee as he reached to L above sh height to a target.  Initially pt compensating with scapular elevation so adjusted the movement for an underhand reach vs overhand to prevent shoulder hike -standing at wall working on wall slides with therapist guiding scapula down as pt reached L hand fully up wall, then held for isometric holds and then slowly lowered his hand to waist level for eccentric control of arm   Pt resting sitting on bed with all needs met.  call light in reach.     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) Restrictions Weight Bearing Restrictions Per Provider Order: No Vital Signs: Therapy Vitals BP:  96/84 Pain: Pain Assessment Pain Scale: 0-10 Pain Score: 0-No pain ADL: ADL Eating: Set up Where Assessed-Eating: Chair Grooming: Independent Where Assessed-Grooming: Sitting at sink Upper Body Bathing: Setup Where Assessed-Upper Body Bathing: Shower Lower Body Bathing: Supervision/safety Where Assessed-Lower Body Bathing: Shower Upper Body Dressing: Independent Where Assessed-Upper Body Dressing: Edge of bed Lower Body Dressing: Supervision/safety Where Assessed-Lower Body Dressing: Edge of bed Toileting: Supervision/safety Where Assessed-Toileting: Teacher, adult education: Close supervision Toilet Transfer Method: Proofreader: Engineer, technical sales: Not assessed Film/video editor: Close supervision Film/video editor Method: Designer, industrial/product: Grab bars   Therapy/Group: Individual Therapy  Britta Louth 01/24/2024, 10:34 AM

## 2024-01-24 NOTE — Progress Notes (Signed)
 PROGRESS NOTE   Subjective/Complaints:  No issues overnite , eyes feel good today  No abd issues  ROS: Patient denies CP, SOB, N/V/D  Objective:   No results found.  No results for input(s): "WBC", "HGB", "HCT", "PLT" in the last 72 hours.  No results for input(s): "NA", "K", "CL", "CO2", "GLUCOSE", "BUN", "CREATININE", "CALCIUM " in the last 72 hours.    Intake/Output Summary (Last 24 hours) at 01/24/2024 1125 Last data filed at 01/24/2024 0921 Gross per 24 hour  Intake 949 ml  Output 1200 ml  Net -251 ml        Physical Exam: Vital Signs Blood pressure 96/84, pulse 65, temperature 99.5 F (37.5 C), temperature source Oral, resp. rate 16, height 5\' 11"  (1.803 m), weight 98 kg, SpO2 95%.  HEENT.  No injection OU, no drainage or crusting about the eye.  Normal extraocular muscle movement.  Normal lid closure General: No acute distress Mood and affect are appropriate Heart: Regular rate and rhythm no rubs murmurs or extra sounds Lungs: Clear to auscultation, breathing unlabored, no rales or wheezes Abdomen: Positive bowel sounds, soft nontender to palpation, nondistended Extremities: No clubbing, cyanosis, or edema Skin: No evidence of breakdown, no evidence of rash   Neuro:  pt is alert and oriented x 3. Fair insight and awareness.   Normal language,mild/mod dysarthria.  Left central VII and mild left tongue deviation.  LUE3/5 prox to distal.  LLE is 4+/5 prox to distal.  Fine motor finger to thumb intact on left but slower than on right  Sensory exam normal for light touch and pain in all 4 limbs.  No limb ataxia or cerebellar signs.  No abnormal tone appreciated.    Musculoskeletal: Full ROM, No pain with AROM or PROM in the neck, trunk, or extremities. Posture appropriate   Physical exam unchanged from the above on reexamination 01/24/24    Assessment/Plan: 1. Functional deficits which require 3+ hours  per day of interdisciplinary therapy in a comprehensive inpatient rehab setting. Physiatrist is providing close team supervision and 24 hour management of active medical problems listed below. Physiatrist and rehab team continue to assess barriers to discharge/monitor patient progress toward functional and medical goals  Care Tool:  Bathing    Body parts bathed by patient: Left arm, Chest, Abdomen, Front perineal area, Right upper leg, Left upper leg, Right lower leg, Face, Left lower leg, Right arm, Buttocks   Body parts bathed by helper: Right arm, Buttocks     Bathing assist Assist Level: Supervision/Verbal cueing     Upper Body Dressing/Undressing Upper body dressing   What is the patient wearing?: Pull over shirt    Upper body assist Assist Level: Independent    Lower Body Dressing/Undressing Lower body dressing      What is the patient wearing?: Underwear/pull up, Pants     Lower body assist Assist for lower body dressing: Supervision/Verbal cueing     Toileting Toileting    Toileting assist Assist for toileting: Supervision/Verbal cueing     Transfers Chair/bed transfer  Transfers assist     Chair/bed transfer assist level: Contact Guard/Touching assist     Locomotion Ambulation   Ambulation assist  Assist level: Minimal Assistance - Patient > 75% Assistive device: No Device Max distance: 175'   Walk 10 feet activity   Assist     Assist level: Minimal Assistance - Patient > 75% Assistive device: No Device   Walk 50 feet activity   Assist    Assist level: Minimal Assistance - Patient > 75% Assistive device: No Device    Walk 150 feet activity   Assist Walk 150 feet activity did not occur: Safety/medical concerns  Assist level: Minimal Assistance - Patient > 75% Assistive device: No Device    Walk 10 feet on uneven surface  activity   Assist Walk 10 feet on uneven surfaces activity did not occur: Safety/medical  concerns         Wheelchair     Assist Is the patient using a wheelchair?: Yes Type of Wheelchair: Manual    Wheelchair assist level: Supervision/Verbal cueing Max wheelchair distance: 150    Wheelchair 50 feet with 2 turns activity    Assist        Assist Level: Supervision/Verbal cueing   Wheelchair 150 feet activity     Assist      Assist Level: Supervision/Verbal cueing   Blood pressure 96/84, pulse 65, temperature 99.5 F (37.5 C), temperature source Oral, resp. rate 16, height 5\' 11"  (1.803 m), weight 98 kg, SpO2 95%.  Medical Problem List and Plan: 1. 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  Impaired mobility -DVT/anticoagulation:  Pharmaceutical: Lovenox - may d/c due to amb status              -antiplatelet therapy: continue Aspirin  81 mg daily and Plavix  75 mg day x 3 weeks then aspirin  alone   3. Pain Management: Tylenol  as needed   4. Mood/Behavior/Sleep: Provide emotional support             -antipsychotic agents: N/A   5. Neuropsych/cognition: This patient is capable of making decisions on his own behalf.   6. Skin/Wound Care: Routine skin checks 7. Fluids/Electrolytes/Nutrition: pt reports good appetite  -5-31: P.o. intakes 100% for most meals.  Appropriate.   8.  Hypertension.  Continue Norvasc  10 mg daily.  Monitor with increased mobility.    - Added avapro  75mg  daily.   -5/30 improved control. Bp's trending down--obsv only today   - 5-31: Normotensive    01/24/2024   10:22 AM 01/24/2024    4:42 AM 01/23/2024    7:18 PM  Vitals with BMI  Systolic 96 109 101  Diastolic 84 79 76  Pulse  65 66  Mild diastolic elevation but overall controlled   9.  Hyperlipidemia: continue Lipitor   10.   Class II morbid obesity.  BMI 36.26.  Dietary follow-up   11.  Tobacco abuse: continue NicoDerm patch.  Provide counseling   12.  Bilateral conjunctival inflammation.  Continue eyedrops.  Follow-up outpatient Dr.Brasington  Will resume prednisolone  ophthalmic solution.  Orbital CT on 12/29/2023 negative  Much improved today 6/7 13. Screening for vitamin D  deficiency:  vitamin D  level 9  -5/30 begin vitamin D  1000 u daily  14. Urinary urgency--persistent  No retention   15. Slow transit constipation  Improved had incont BM will change senna to prn     LOS: 10 days A FACE TO FACE EVALUATION WAS PERFORMED  Genetta Kenning 01/24/2024, 11:25 AM

## 2024-01-25 NOTE — Progress Notes (Signed)
 PROGRESS NOTE   Subjective/Complaints: Patient without new complaints today.  He slept well.  Denies any bowel problems.  ROS: Patient denies CP, SOB, N/V/D  Objective:   No results found.  No results for input(s): "WBC", "HGB", "HCT", "PLT" in the last 72 hours.  No results for input(s): "NA", "K", "CL", "CO2", "GLUCOSE", "BUN", "CREATININE", "CALCIUM " in the last 72 hours.    Intake/Output Summary (Last 24 hours) at 01/25/2024 1101 Last data filed at 01/25/2024 1004 Gross per 24 hour  Intake 947 ml  Output 1450 ml  Net -503 ml        Physical Exam: Vital Signs Blood pressure 95/69, pulse 81, temperature 99.2 F (37.3 C), temperature source Oral, resp. rate 18, height 5\' 11"  (1.803 m), weight 98 kg, SpO2 95%.  HEENT.  No injection OU, no drainage or crusting about the eye.  Normal extraocular muscle movement.  Normal lid closure General: No acute distress Mood and affect are appropriate Heart: Regular rate and rhythm no rubs murmurs or extra sounds Lungs: Clear to auscultation, breathing unlabored, no rales or wheezes Abdomen: Positive bowel sounds, soft nontender to palpation, nondistended Extremities: No clubbing, cyanosis, or edema Skin: No evidence of breakdown, no evidence of rash   Neuro:  pt is alert and oriented x 3. Fair insight and awareness.   Normal language,mild/mod dysarthria.  Left central VII and mild left tongue deviation.  LUE 4-/5 prox to distal.  LLE is 4+/5 prox to distal.  Fine motor finger to thumb intact on left but slower than on right  Sensory exam normal for light touch and pain in all 4 limbs.  No limb ataxia or cerebellar signs.  No abnormal tone appreciated.    Musculoskeletal: Full ROM, No pain with AROM or PROM in the neck, trunk, or extremities. Posture appropriate   Physical exam unchanged from the above on reexamination 01/25/24    Assessment/Plan: 1. Functional  deficits which require 3+ hours per day of interdisciplinary therapy in a comprehensive inpatient rehab setting. Physiatrist is providing close team supervision and 24 hour management of active medical problems listed below. Physiatrist and rehab team continue to assess barriers to discharge/monitor patient progress toward functional and medical goals  Care Tool:  Bathing    Body parts bathed by patient: Left arm, Chest, Abdomen, Front perineal area, Right upper leg, Left upper leg, Right lower leg, Face, Left lower leg, Right arm, Buttocks   Body parts bathed by helper: Right arm, Buttocks     Bathing assist Assist Level: Supervision/Verbal cueing     Upper Body Dressing/Undressing Upper body dressing   What is the patient wearing?: Pull over shirt    Upper body assist Assist Level: Independent    Lower Body Dressing/Undressing Lower body dressing      What is the patient wearing?: Underwear/pull up, Pants     Lower body assist Assist for lower body dressing: Supervision/Verbal cueing     Toileting Toileting    Toileting assist Assist for toileting: Supervision/Verbal cueing     Transfers Chair/bed transfer  Transfers assist     Chair/bed transfer assist level: Contact Guard/Touching assist     Locomotion Ambulation  Ambulation assist      Assist level: Minimal Assistance - Patient > 75% Assistive device: No Device Max distance: 175'   Walk 10 feet activity   Assist     Assist level: Minimal Assistance - Patient > 75% Assistive device: No Device   Walk 50 feet activity   Assist    Assist level: Minimal Assistance - Patient > 75% Assistive device: No Device    Walk 150 feet activity   Assist Walk 150 feet activity did not occur: Safety/medical concerns  Assist level: Minimal Assistance - Patient > 75% Assistive device: No Device    Walk 10 feet on uneven surface  activity   Assist Walk 10 feet on uneven surfaces activity did  not occur: Safety/medical concerns         Wheelchair     Assist Is the patient using a wheelchair?: Yes Type of Wheelchair: Manual    Wheelchair assist level: Supervision/Verbal cueing Max wheelchair distance: 150    Wheelchair 50 feet with 2 turns activity    Assist        Assist Level: Supervision/Verbal cueing   Wheelchair 150 feet activity     Assist      Assist Level: Supervision/Verbal cueing   Blood pressure 95/69, pulse 81, temperature 99.2 F (37.3 C), temperature source Oral, resp. rate 18, height 5\' 11"  (1.803 m), weight 98 kg, SpO2 95%.  Medical Problem List and Plan: 1. 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  Impaired mobility -DVT/anticoagulation:  Pharmaceutical: Lovenox - may d/c due to amb status              -antiplatelet therapy: continue Aspirin  81 mg daily and Plavix  75 mg day x 3 weeks then aspirin  alone   3. Pain Management: Tylenol  as needed   4. Mood/Behavior/Sleep: Provide emotional support             -antipsychotic agents: N/A   5. Neuropsych/cognition: This patient is capable of making decisions on his own behalf.   6. Skin/Wound Care: Routine skin checks 7. Fluids/Electrolytes/Nutrition: pt reports good appetite  -5-31: P.o. intakes 100% for most meals.  Appropriate.   8.  Hypertension.  Continue Norvasc  10 mg daily.  Monitor with increased mobility.    - Added avapro  75mg  daily.   -5/30 improved control. Bp's trending down--obsv only today   - 5-31: Normotensive    01/25/2024   10:25 AM 01/25/2024    5:02 AM 01/24/2024    7:18 PM  Vitals with BMI  Systolic 95 111 109  Diastolic 69 86 68  Pulse  81 76  Mild diastolic elevation but overall controlled   9.  Hyperlipidemia:  continue Lipitor   10.  Class II morbid obesity.  BMI 36.26.  Dietary follow-up   11.  Tobacco abuse: continue NicoDerm patch.  Provide counseling   12.  Bilateral conjunctival inflammation.  Continue eyedrops.  Follow-up outpatient Dr.Brasington  Will resume prednisolone  ophthalmic solution.  Orbital CT on 12/29/2023 negative  Much improved today 6/7 13. Screening for vitamin D  deficiency:  vitamin D  level 9  -5/30  begin vitamin D  1000 u daily  14. Urinary urgency--persistent  No retention   15. Slow transit constipation  Improved had incont BM will change senna to prn     LOS: 11 days A FACE TO FACE EVALUATION WAS PERFORMED  Mark Mcintosh 01/25/2024, 11:01 AM

## 2024-01-25 NOTE — Progress Notes (Signed)
 Speech Language Pathology Daily Session Note  Patient Details  Name: Mark Mcintosh MRN: [id number] Date of Birth: 1971-07-07  Today's Date: 01/25/2024 SLP Individual Time: 1346-1455 SLP Individual Time Calculation (min): 69 min  Short Term Goals: Week 2: SLP Short Term Goal 1 (Week 2): STGs=LTGs d/t ELOS  Skilled Therapeutic Interventions:  Pt seen for ST session targeting dysarthria goals. Pt reported 4/10 back pain; SLP informed Nursing. SLP facilitated EMST to target respiratory support and pt completed x25 at level 60 reporting a perceived difficulty of 5/10 and x10 at approx level 65-70 reporting a difficulty of 7/10. SLP facilitated IOPI exercises for lingual strength and pt completed x40 at 43 kpa anteriorly and 46 kpa posteriorly. Pt reports most difficulty with /s,z/ phonemes. SLP provided education on correct articulatory placement and pt demonstrated understanding with 100% acc for production of single phoneme and CV syllables when provided min verbal cueing. Pt recalled dysarthria strategies with 100% acc IND and used them during tongue twister sentences when provided min A. Speech intelligibility in conversation was approximately 90-95%. Pt became upset at the end of the session, reporting he was feeling overwhelmed with upcoming d/c and that he is grieving loss of independence. SLP provided active listening and emotional support; encouraged pt to continue discussing concerns with care team. Pt left in bed with call bell in reach and alarm activated. Continue ST POC.   Pain Pain Assessment Pain Scale: 0-10 Pain Score: 4  Faces Pain Scale: No hurt Pain Type: Acute pain Pain Location: Back  Therapy/Group: Individual Therapy  Caretha Chapel, MA CCC-SLP Speech-Language Pathologist 01/25/2024, 5:10 PM

## 2024-01-25 NOTE — Progress Notes (Signed)
 Physical Therapy Session Note  Patient Details  Name: Mark Mcintosh MRN: [id number] Date of Birth: May 17, 1971  Today's Date: 01/25/2024 PT Individual Time: 4098-1191 PT Individual Time Calculation (min): 25 min   Short Term Goals: Week 1:  PT Short Term Goal 1 (Week 1): Pt will complete bed mobility with CGA PT Short Term Goal 1 - Progress (Week 1): Met PT Short Term Goal 2 (Week 1): Pt will complete bed<>chair transfers minA with LRAD PT Short Term Goal 2 - Progress (Week 1): Met PT Short Term Goal 3 (Week 1): Pt will ambulate 12ft with minA and LRAD PT Short Term Goal 3 - Progress (Week 1): Met PT Short Term Goal 4 (Week 1): Pt will complete functional outcome measure to assess balance and falls risk PT Short Term Goal 4 - Progress (Week 1): Met  Skilled Therapeutic Interventions/Progress Updates:   Session focused on functional transfers, dynamic standing balance, and supine lower back stretches for pain management. Pt performed bed mobility independent in session including on mat table. Transfers with rollator with close supervision to CGA for balance. Functional gait on unit x 150' with rollator with overall CGA for balance and cues for clearance of LLE as toe drag noted but pt able to correct balance. Pt performed sit <> stands without UE support while hold tidal tank for increased challenge of balance x 5 reps with CGA. Transferred into supine for lower back stretching including trunk rotation x 10 reps each direction, single knee to chest x 5 reps each side. Pt reports relief and discussed doing this before bed and in the AM also. End of session set up in w/c with all needs in reach.   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) Restrictions Weight Bearing Restrictions Per Provider Order: No  Pain: Reports back pain unrated. Performed stretchign during  session which pt reports helped. Also nursing to order Kpad for heat relief.    Therapy/Group: Individual Therapy  Gita Lamb Amadeo June, PT, DPT, CBIS  01/25/2024, 12:02 PM

## 2024-01-25 NOTE — Plan of Care (Signed)
  Problem: Consults Goal: RH STROKE PATIENT EDUCATION Description: See Patient Education module for education specifics  Outcome: Progressing   Problem: RH BOWEL ELIMINATION Goal: RH STG MANAGE BOWEL WITH ASSISTANCE Description: STG Manage Bowel with mod I Assistance. Outcome: Progressing   Problem: RH BLADDER ELIMINATION Goal: RH STG MANAGE BLADDER WITH ASSISTANCE Description: STG Manage Bladder With  mod I Assistance Outcome: Progressing   Problem: RH SKIN INTEGRITY Goal: RH STG SKIN FREE OF INFECTION/BREAKDOWN Description: Manage skin free of infection/breakdown with mod I assistance Outcome: Progressing   Problem: RH SAFETY Goal: RH STG ADHERE TO SAFETY PRECAUTIONS W/ASSISTANCE/DEVICE Description: STG Adhere to Safety Precautions With  mod I Assistance/Device. Outcome: Progressing   Problem: RH PAIN MANAGEMENT Goal: RH STG PAIN MANAGED AT OR BELOW PT'S PAIN GOAL Description: <4 w/ prns Outcome: Progressing   Problem: RH KNOWLEDGE DEFICIT Goal: RH STG INCREASE KNOWLEDGE OF HYPERTENSION Description: Manage increase knowledge of hypertension with mod I assistance from friend using educational materials provided Outcome: Progressing

## 2024-01-25 NOTE — Plan of Care (Signed)
  Problem: Consults Goal: RH STROKE PATIENT EDUCATION Description: See Patient Education module for education specifics  Outcome: Progressing   Problem: RH BOWEL ELIMINATION Goal: RH STG MANAGE BOWEL WITH ASSISTANCE Description: STG Manage Bowel with mod I Assistance. Outcome: Progressing   Problem: RH BLADDER ELIMINATION Goal: RH STG MANAGE BLADDER WITH ASSISTANCE Description: STG Manage Bladder With  mod I Assistance Outcome: Progressing   Problem: RH SKIN INTEGRITY Goal: RH STG SKIN FREE OF INFECTION/BREAKDOWN Description: Manage skin free of infection/breakdown with mod I assistance Outcome: Progressing   Problem: RH SAFETY Goal: RH STG ADHERE TO SAFETY PRECAUTIONS W/ASSISTANCE/DEVICE Description: STG Adhere to Safety Precautions With  mod I Assistance/Device. Outcome: Progressing   Problem: RH PAIN MANAGEMENT Goal: RH STG PAIN MANAGED AT OR BELOW PT'S PAIN GOAL Description: <4 w/ prns Outcome: Progressing   Problem: RH KNOWLEDGE DEFICIT Goal: RH STG INCREASE KNOWLEDGE OF HYPERTENSION Description: Manage increase knowledge of hypertension with mod I assistance from friend using educational materials provided Outcome: Progressing Goal: RH STG INCREASE KNOWLEDGE OF STROKE PROPHYLAXIS Description: Manage increase knowledge of stroke prophylaxis with mod I assistance from friend using educational materials provided Outcome: Progressing

## 2024-01-26 DIAGNOSIS — K5900 Constipation, unspecified: Secondary | ICD-10-CM

## 2024-01-26 NOTE — Progress Notes (Signed)
 PROGRESS NOTE   Subjective/Complaints: No new complaints or concerns today. Reports BM today.  ROS: Patient denies fever, CP, SOB, N/V/D  Objective:   No results found.  No results for input(s): "WBC", "HGB", "HCT", "PLT" in the last 72 hours.  No results for input(s): "NA", "K", "CL", "CO2", "GLUCOSE", "BUN", "CREATININE", "CALCIUM " in the last 72 hours.    Intake/Output Summary (Last 24 hours) at 01/26/2024 1122 Last data filed at 01/26/2024 0743 Gross per 24 hour  Intake 240 ml  Output 700 ml  Net -460 ml        Physical Exam: Vital Signs Blood pressure 103/75, pulse 91, temperature 98.3 F (36.8 C), resp. rate 18, height 5\' 11"  (1.803 m), weight 98 kg, SpO2 92%.  HEENT.  [place]/AT General: No acute distress Mood and affect are appropriate Heart: Regular rate and rhythm no rubs murmurs or extra sounds Lungs: Clear to auscultation, breathing unlabored, no rales or wheezes Abdomen: Positive bowel sounds, soft nontender to palpation, nondistended Extremities: No clubbing, cyanosis, or edema Skin: No evidence of breakdown, no evidence of rash   Neuro:  pt is alert and oriented x 3. Fair insight and awareness.   Normal language,mild/mod dysarthria.  Left central VII and mild left tongue deviation.  LUE 4-/5 prox to distal.  LLE is 4+/5 prox to distal.  Fine motor finger to thumb intact on left but slower than on right  Sensory exam normal for light touch and pain in all 4 limbs.  No limb ataxia or cerebellar signs.  No abnormal tone appreciated.    Musculoskeletal: Full ROM, No pain with AROM or PROM in the neck, trunk, or extremities. Posture appropriate   Physical exam unchanged from the above on reexamination 01/26/24    Assessment/Plan: 1. Functional deficits which require 3+ hours per day of interdisciplinary therapy in a comprehensive inpatient rehab setting. Physiatrist is providing close team  supervision and 24 hour management of active medical problems listed below. Physiatrist and rehab team continue to assess barriers to discharge/monitor patient progress toward functional and medical goals  Care Tool:  Bathing    Body parts bathed by patient: Left arm, Chest, Abdomen, Front perineal area, Right upper leg, Left upper leg, Right lower leg, Face, Left lower leg, Right arm, Buttocks   Body parts bathed by helper: Right arm, Buttocks     Bathing assist Assist Level: Supervision/Verbal cueing     Upper Body Dressing/Undressing Upper body dressing   What is the patient wearing?: Pull over shirt    Upper body assist Assist Level: Independent    Lower Body Dressing/Undressing Lower body dressing      What is the patient wearing?: Underwear/pull up, Pants     Lower body assist Assist for lower body dressing: Supervision/Verbal cueing     Toileting Toileting    Toileting assist Assist for toileting: Supervision/Verbal cueing     Transfers Chair/bed transfer  Transfers assist     Chair/bed transfer assist level: Contact Guard/Touching assist     Locomotion Ambulation   Ambulation assist      Assist level: Contact Guard/Touching assist Assistive device: Rollator Max distance: 150   Walk 10 feet activity  Assist     Assist level: Minimal Assistance - Patient > 75% Assistive device: Rollator   Walk 50 feet activity   Assist    Assist level: Contact Guard/Touching assist Assistive device: Rollator    Walk 150 feet activity   Assist Walk 150 feet activity did not occur: Safety/medical concerns  Assist level: Contact Guard/Touching assist Assistive device: Rollator    Walk 10 feet on uneven surface  activity   Assist Walk 10 feet on uneven surfaces activity did not occur: Safety/medical concerns         Wheelchair     Assist Is the patient using a wheelchair?: Yes Type of Wheelchair: Manual    Wheelchair assist  level: Supervision/Verbal cueing Max wheelchair distance: 150    Wheelchair 50 feet with 2 turns activity    Assist        Assist Level: Supervision/Verbal cueing   Wheelchair 150 feet activity     Assist      Assist Level: Supervision/Verbal cueing   Blood pressure 103/75, pulse 91, temperature 98.3 F (36.8 C), resp. rate 18, height 5\' 11"  (1.803 m), weight 98 kg, SpO2 92%.  Medical Problem List and Plan: 1. 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  Impaired mobility -DVT/anticoagulation:  Pharmaceutical: Lovenox - may d/c due to amb status              -antiplatelet therapy: continue Aspirin  81 mg daily and Plavix  75 mg day x 3 weeks then aspirin  alone   3. Pain Management: Tylenol  as needed   4. Mood/Behavior/Sleep: Provide emotional support             -antipsychotic agents: N/A   5. Neuropsych/cognition: This patient is capable of making decisions on his own behalf.   6. Skin/Wound Care: Routine skin checks 7. Fluids/Electrolytes/Nutrition: pt reports good appetite  -5-31: P.o. intakes 100% for most meals.  Appropriate.   8.  Hypertension.  Continue Norvasc  10 mg daily.  Monitor with increased mobility.    - Added avapro  75mg  daily.   -5/30 improved control. Bp's trending down--obsv only today     01/26/2024    5:21 AM 01/25/2024    7:44 PM 01/25/2024    1:00 PM  Vitals with BMI  Systolic 103 122 99  Diastolic 75 85 77  Pulse 91 68 70  Mild diastolic elevation but overall controlled   -6/9 Controlled, continue current   9.  Hyperlipidemia: continue Lipitor   10.  Class II morbid obesity.  BMI 36.26.  Dietary follow-up   11.  Tobacco abuse: continue NicoDerm patch.  Provide  counseling   12.  Bilateral conjunctival inflammation.  Continue eyedrops.  Follow-up outpatient Dr.Brasington  Will resume prednisolone  ophthalmic solution.  Orbital CT on 12/29/2023 negative  Much improved today 6/7 13. Screening for vitamin D  deficiency:  vitamin D  level 9  -5/30 begin vitamin D  1000 u daily  14. Urinary urgency--persistent  No retention   -6/9 continent , continue to monitor   15. Slow transit constipation  Improved had incont BM will change senna  to prn   -6/9 Pt reports BM this AM, improved, continue to monitor     LOS: 12 days A FACE TO FACE EVALUATION WAS PERFORMED  Lylia Sand 01/26/2024, 11:22 AM

## 2024-01-26 NOTE — Progress Notes (Signed)
 Physical Therapy Session Note  Patient Details  Name: Mark Mcintosh MRN: [id number] Date of Birth: [birth year]  Today's Date: 01/26/2024 PT Individual Time: 1030-1130 PT Individual Time Calculation (min): 60 min   Short Term Goals: Week 2:     Skilled Therapeutic Interventions/Progress Updates: Pt presents supine I bed and awakens and agreeable to therapy.  Pt transfers sup to sit mod I and then sit to stand w/ supervision.  Pt amb >150' w/ rollator and CGA/fading to close sup.  Pt initially amb w/ foot clearance to l, but then does exhibit some "scuffing" to l toes.  Cues given for weight shift to R w/ improved foot clearance.  Pt performed sit to stand transfers w/o UE support and CGA/supervision 3 x 10.  Pt performs step-ups to 6" platform alternating feet, cues for weight shift to L.  Pt performs sidesteps to 6" platform side to side w/ 1 LOB to L.  Pt performed sidestep-overs small hurdle.  Pt performed obstacle course stepping over hurdles w/o LOB.  Pt amb w/o AD and CGA w/ cues for posture, arm swing and increased speed for improved balance.  Pt amb to room w/ rollator and supervision to recliner.  Seat alarm on and all needs in reach.  Pt expressing concerns for return to sexual activity.     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) Restrictions Weight Bearing Restrictions Per Provider Order: No General:   Vital Signs: Therapy Vitals Temp: 98.6 F (37 C) Temp Source: Oral Pulse Rate: 98 Resp: 17 BP: 102/69 Patient Position (if appropriate): Sitting Oxygen  Therapy SpO2: 100 % O2 Device: Room Air Pain: 0/10       Therapy/Group: Individual Therapy  Jaelynn Pozo P Areeba Sulser 01/26/2024, 3:32 PM

## 2024-01-26 NOTE — Progress Notes (Signed)
 Occupational Therapy Session Note  Patient Details  Name: Mark Mcintosh MRN: [id number] Date of Birth: May 04, 1971  Today's Date: 01/26/2024 OT Individual Time: 0830-1005 OT Individual Time Calculation (min): 95 min    Short Term Goals: Week 2:  OT Short Term Goal 1 (Week 2): STGs = LTGs  Skilled Therapeutic Interventions/Progress Updates:     Pt received in bed ready for therapy.       ADL Retraining: - pt completed undressing from EOB, ambulation with 4WW, shower, then dressing from EOB with no physical assist but continues to need cues for L foot placement to increase safety and decrease fall risk.  He continues to keep L foot in midline in sitting and standing versus under his hip, which makes him lean to far too the L when he needs to reach and weight shift.    Cued pt to focus on where his L leg is.  Will need to really focus on this this next week to prevent falls at home. Overall close Supervision with self care and mobility with 4WW.    Neuromuscular Re-Education:  -pt ambulated to ortho gym with [phone]- occasional cues to lift toes by focusing on a heel strike to avoid cathching his toe -sat on mat to focus on [facility] of thumb and fingers  - lateral pinching of yellow and red resisted clothes pins (light and med), the harder green ones he needed help stabilizing his thumb. He has limited control of thumb which makes lateral pinch challenging for pt.    - pt worked on pulling pegs out of resistive board but again was having difficulty due to thumb control     -lifting and lowering 3 lb ball with L hand for grasp and to challenge elbow strength  To help with [facility] in functional task, began to fabricate a temporary thumb spica splint.  Will continue to finish splint at a later time to use during sessions.    Pt ambulated back toward his room.   Pt's NT met him in the hallway to ambulate him back to the room.    Therapy Documentation Precautions:  Precautions Precautions:  Fall Recall of Precautions/Restrictions: Impaired Precaution/Restrictions Comments: L hemi (UE > LE) Restrictions Weight Bearing Restrictions Per Provider Order: No    Vital Signs:   Pain: Pain Assessment Pain Scale: 0-10 Pain Score: 0-No pain ADL: ADL Eating: Set up Where Assessed-Eating: Chair Grooming: Independent Where Assessed-Grooming: Sitting at sink Upper Body Bathing: Setup Where Assessed-Upper Body Bathing: Shower Lower Body Bathing: Supervision/safety Where Assessed-Lower Body Bathing: Shower Upper Body Dressing: Independent Where Assessed-Upper Body Dressing: Edge of bed Lower Body Dressing: Supervision/safety Where Assessed-Lower Body Dressing: Edge of bed Toileting: Supervision/safety Where Assessed-Toileting: Teacher, adult education: Close supervision Toilet Transfer Method: Proofreader: Engineer, technical sales: Not assessed Film/video editor: Close supervision Film/video editor Method: Designer, industrial/product: Grab bars      Therapy/Group: Individual Therapy  Aide Wojnar 01/26/2024, 12:18 PM

## 2024-01-26 NOTE — Progress Notes (Signed)
 Occupational Therapy Session Note  Patient Details  Name: Mark Mcintosh MRN: [id number] Date of Birth: [birth year]  Today's Date: 01/26/2024 OT Individual Time: 4098-1191 OT Individual Time Calculation (min): 46 min    Short Term Goals: Week 2:  OT Short Term Goal 1 (Week 2): STGs = LTGs  Skilled Therapeutic Interventions/Progress Updates:  Pt greeted seated in recliner, pt agreeable to OT intervention.      Transfers/bed mobility/functional mobility: pt completed functional ambulation with rollator and CGA.   Therapeutic activity: pt completed various FMC tasks with a focus on improving proprioception and motor planning. Donned thumb support orthotic for LUE with pt instructed to grasp cones and then stack cones in order to facilitate improved cylindrical grasp for higher level ADLS. Pt completed task with + time and supervision with MOD cues needed for set- up and technique. Pt completed same task wihtout orthotic donned, pt seemed to do better with brace doffed as brace created a slick surface with pt having difficulty maintaining grasp on items.   Pt completed seated pincer grasp task with pt instructed to grasp small cubed blocks and stack blocks on top of each other to facilitate improved shoulder stability and FMC in LUE. Pt completed task with + time and supervision. Pt needed elbow to be supported during task to promote proximal support.   Pt completed seated bimanual task with pt instructed to grasp beads with LUE and thread beads on string on RUE. Pt completed task with + time and needed beads to be laid on table vs pulling beads out from bucket, but pt completed task with + time and supervision assist. Pt completed 16 beads total.   Pt completed seated resistance training task to facilitate improved intrinsic strength with pt intsructed to use tweezers to pick up beads and transport beads into cup. Pt completed task with + time but superision   ADLs:  Transfers: ambulatory  toilet transfer into bathroom with rollator and CGA.  Toileting: pt completed 3/3 toileting tasks with CGA.                Ended session with pt seated on toilet, NT aware.                 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) Restrictions Weight Bearing Restrictions Per Provider Order: No  Pain: No pain    Therapy/Group: Individual Therapy  [name] 01/26/2024, 3:13 PM

## 2024-01-27 NOTE — Progress Notes (Signed)
 Physical Therapy Session Note  Patient Details  Name: Mark Mcintosh MRN: [id number] Date of Birth: 03/24/1971  Today's Date: 01/27/2024 PT Individual Time: 0865-7846 PT Individual Time Calculation (min): 74 min   Short Term Goals: Week 1:  PT Short Term Goal 1 (Week 1): Pt will complete bed mobility with CGA PT Short Term Goal 1 - Progress (Week 1): Met PT Short Term Goal 2 (Week 1): Pt will complete bed<>chair transfers minA with LRAD PT Short Term Goal 2 - Progress (Week 1): Met PT Short Term Goal 3 (Week 1): Pt will ambulate 180ft with minA and LRAD PT Short Term Goal 3 - Progress (Week 1): Met PT Short Term Goal 4 (Week 1): Pt will complete functional outcome measure to assess balance and falls risk PT Short Term Goal 4 - Progress (Week 1): Met  Skilled Therapeutic Interventions/Progress Updates: Patient sitting EOB on entrance to room. Patient alert and agreeable to PT session.   Patient reported unrated low back pain on R (manual therapy and therex provided with pt reporting decrease in pain).  Therapeutic Activity: Bed Mobility: Pt performed sit<L sidelying during manual therapy with supervision/independently.  Transfers: Pt performed sit<>stand transfers throughout session modI.  - Pt ambulated from room<>main gym with rollator (shoe cap donned at end of session on L LE) in order to improve safety in household ambulatory tolerance. Pt progressing with ability to do so with supervision, and closer to modI shorter distances. Pt with improvement in L step clearance, and increase in safety when ambulating back to room with shoe cap donned).   - Floor transfer with demonstrative cuing for sequence (supine<sidelying<quadruped<tall kneeling<standing). Pt performed multiple reps with added cuing for placement of B LE's and UE through each phase, and added engagement on hip abductors to assist with transitioning from sidelying<quadruped ("press outside of knee into mat to lift  hips").  Neuromuscular Re-ed: NMR facilitated during session with focus on proprioceptive feedback, dynamic standing balance, weight shifting, coordintation. - Ambulating without AD and with L shoe cap donned. Pt with CGA throughout (light minA one moment to catch L LOB when added distractions). Pt cued to look in various directions while maintaining cadence, and to perform multidirectional steps when cued. Pt with decrease in L step clearance when added with distractions.   [employer] Donned: Overall, pt progressed with ability to perform stepping strategies required to maintain standing balance (decreased reactive time that improved as interventions improved). - tandem stance with PTA providing perturbations on occasion - Anterior/retro-step hitting beach ball with 3lb bar weight. Pt also cued to kick the ball randomly.    - Progressed to laterally stepping while hitting ball with weight bar - [place] with pt initially requiring increased time to perform movement without having B LE's touch. Pt progressed to being able to perform (wide stepping, however) without looking down at ground (cued to look forward ahead).  NMR performed for improvements in motor control and coordination, balance, sequencing, judgement, and self confidence/ efficacy in performing all aspects of mobility at highest level of independence.   Therapeutic Exercise: Pt performed the following exercises following manual therapy with cues for mechanics. - Piriformis stretch R LE supine - Short sitting abduction with ball between B feet and green theraband donned B knees  Manual Therapy: Palpation of R glute performed with trigger points noted. Education and rationale provided with pt agreeing to participate in intervention. - Trigger point release to piriformis with soft tissue mobilization to follow throughout.  Patient sitting in  WC at end of session with brakes locked, and all needs within reach.      Therapy  Documentation Precautions:  Precautions Precautions: Fall Recall of Precautions/Restrictions: Impaired Precaution/Restrictions Comments: L hemi (UE > LE) Restrictions Weight Bearing Restrictions Per Provider Order: No   Therapy/Group: Individual Therapy  Mark Mcintosh PTA 01/27/2024, 12:46 PM

## 2024-01-27 NOTE — Progress Notes (Signed)
 Occupational Therapy Session Note  Patient Details  Name: Mark Mcintosh MRN: [id number] Date of Birth: 1970/11/13  Today's Date: 01/27/2024 OT Individual Time: 1100-1205 OT Individual Time Calculation (min): 65 min    Short Term Goals: Week 2:  OT Short Term Goal 1 (Week 2): STGs = LTGs      Skilled Therapeutic Interventions/Progress Updates:    Pt received in w/c and agreeable to completing shower and dressing before going to therapy.   Reviewed balance precautions with pt to ensure his L foot is in good alignment to set up for safe stability prior to reaching to L in sit and standing.  Pt completed all self care, including placing laundry in bottom drawer of closet and practicing to open and close doors.  Once LOB stepping back when opening heavy door to room. Had pt practice 2nd time with attending to stepping L foot back enough for staggered stance.  Pt ambulated to gym. Worked in standing the entire session on [facility] tasks of using tip pinch prehension for twisting various caps/ knobs on and off and for pulling resistive pegs off of glass mirror (squeegez).  Lateral pinch with holding plastic ring and passing over rainbow arc for sh flexion.   Standing at windows with sliding hand on cloth up and down window to achieve full sh flex ROM. Pt able to stand on tip toes for increased reach.    Ambulated back to room with all needs met.     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) Restrictions Weight Bearing Restrictions Per Provider Order: No    Pain:  No c/o  ADL: ADL Eating: Set up Where Assessed-Eating: Chair Grooming: Independent Where Assessed-Grooming: Sitting at sink Upper Body Bathing: Setup Where Assessed-Upper Body Bathing: Shower Lower Body Bathing: Supervision/safety Where Assessed-Lower Body Bathing: Shower Upper Body Dressing:  Independent Where Assessed-Upper Body Dressing: Edge of bed Lower Body Dressing: Supervision/safety Where Assessed-Lower Body Dressing: Edge of bed Toileting: Supervision/safety Where Assessed-Toileting: Teacher, adult education: Close supervision Toilet Transfer Method: Proofreader: Engineer, technical sales: Not assessed Film/video editor: Close supervision Film/video editor Method: Designer, industrial/product: Grab bars      Therapy/Group: Individual Therapy  Ceri Mayer 01/27/2024, 1:08 PM

## 2024-01-27 NOTE — Progress Notes (Signed)
 Speech Language Pathology Daily Session Note  Patient Details  Name: Mark Mcintosh MRN: [id number] Date of Birth: [birth year]  Today's Date: 01/27/2024 SLP Individual Time: 1001-1059 SLP Individual Time Calculation (min): 58 min  Short Term Goals: Week 2: SLP Short Term Goal 1 (Week 2): STGs=LTGs d/t ELOS  Skilled Therapeutic Interventions: Skilled therapy session focused on communication goals. SLP facilitated session by prompting patient to complete x25 repetitions of EMST (expiratory muscle strength training) at 80cm H2O to target respiratory strength for speech. SLP targeted lingual strength through use of IOPI ([place]  Oral Performance Instrument). Patient completed 30 repetitions of 43 kpa anteriorly and 46 kpa posteriorly. Patient was approximately 95% intelligible at the conversational level this date given mod iA. Patient left in chair with alarm set and call bell in reach. Continue POC.  Pain None reported   Therapy/Group: Individual Therapy  [name] M.A., CCC-SLP 01/27/2024, 7:38 AM

## 2024-01-27 NOTE — Plan of Care (Signed)
  Problem: RH Balance Goal: LTG Patient will maintain dynamic sitting balance (PT) Description: LTG:  Patient will maintain dynamic sitting balance with assistance during mobility activities (PT) Flowsheets (Taken 01/27/2024 1424) LTG: Pt will maintain dynamic sitting balance during mobility activities with:: (upgraded) Independent with assistive device  Note: Upgraded due to progress Goal: LTG Patient will maintain dynamic standing balance (PT) Description: LTG:  Patient will maintain dynamic standing balance with assistance during mobility activities (PT) Flowsheets (Taken 01/27/2024 1424) LTG: Pt will maintain dynamic standing balance during mobility activities with:: (Upgraded due to progress) Independent with assistive device  Note: Upgraded due to progress   Problem: Sit to Stand Goal: LTG:  Patient will perform sit to stand with assistance level (PT) Description: LTG:  Patient will perform sit to stand with assistance level (PT) Flowsheets (Taken 01/27/2024 1424) LTG: PT will perform sit to stand in preparation for functional mobility with assistance level: (Upgraded due to progress) Independent with assistive device Note: Upgraded due to progress   Problem: RH Bed Mobility Goal: LTG Patient will perform bed mobility with assist (PT) Description: LTG: Patient will perform bed mobility with assistance, with/without cues (PT). Flowsheets (Taken 01/27/2024 1424) LTG: Pt will perform bed mobility with assistance level of: (Upgraded due to progress) Independent Note: Upgraded due to progress   Problem: RH Bed to Chair Transfers Goal: LTG Patient will perform bed/chair transfers w/assist (PT) Description: LTG: Patient will perform bed to chair transfers with assistance (PT). Flowsheets (Taken 01/27/2024 1424) LTG: Pt will perform Bed to Chair Transfers with assistance level: (Upgraded due to progress) Independent with assistive device  Note: Upgraded due to progress   Problem: RH Car  Transfers Goal: LTG Patient will perform car transfers with assist (PT) Description: LTG: Patient will perform car transfers with assistance (PT). Flowsheets (Taken 01/27/2024 1424) LTG: Pt will perform car transfers with assist:: (Upgraded due to progress) Supervision/Verbal cueing Note: Upgraded due to progress   Problem: RH Ambulation Goal: LTG Patient will ambulate in controlled environment (PT) Description: LTG: Patient will ambulate in a controlled environment, # of feet with assistance (PT). Flowsheets Taken 01/27/2024 1424 by Gita Lamb B, PT LTG: Pt will ambulate in controlled environ  assist needed:: (Upgraded due to progress) Supervision/Verbal cueing Taken 01/15/2024 1257 by Drury Geralds, Christian P, PT LTG: Ambulation distance in controlled environment: 160ft Note: Upgraded due to progress Goal: LTG Patient will ambulate in home environment (PT) Description: LTG: Patient will ambulate in home environment, # of feet with assistance (PT). Flowsheets Taken 01/27/2024 1424 by Ouida Bloom, PT LTG: Pt will ambulate in home environ  assist needed:: (Upgraded due to progress) Independent with assistive device Taken 01/15/2024 1257 by Manhard, Christian P, PT LTG: Ambulation distance in home environment: 6ft Note: Upgraded due to progress   Problem: RH Stairs Goal: LTG Patient will ambulate up and down stairs w/assist (PT) Description: LTG: Patient will ambulate up and down # of stairs with assistance (PT) Flowsheets (Taken 01/27/2024 1424) LTG: Pt will ambulate up/down stairs assist needed:: (Upgraded due to progress) Supervision/Verbal cueing LTG: Pt will  ambulate up and down number of stairs: 1 curb step for home entry Note: Upgraded due to progress

## 2024-01-27 NOTE — Progress Notes (Signed)
 PROGRESS NOTE   Subjective/Complaints: Did well in therapy per PT today , worked on floor transfers   ROS: Patient denies fever, CP, SOB, N/V/D  Objective:   No results found.  No results for input(s): "WBC", "HGB", "HCT", "PLT" in the last 72 hours.  No results for input(s): "NA", "K", "CL", "CO2", "GLUCOSE", "BUN", "CREATININE", "CALCIUM " in the last 72 hours.    Intake/Output Summary (Last 24 hours) at 01/27/2024 0845 Last data filed at 01/27/2024 0713 Gross per 24 hour  Intake 716 ml  Output 1525 ml  Net -809 ml        Physical Exam: Vital Signs Blood pressure 120/81, pulse 61, temperature 98.6 F (37 C), resp. rate 18, height 5\' 11"  (1.803 m), weight 98 kg, SpO2 99%.  HEENT.  [place]/AT General: No acute distress Mood and affect are appropriate Heart: Regular rate and rhythm no rubs murmurs or extra sounds Lungs: Clear to auscultation, breathing unlabored, no rales or wheezes Abdomen: Positive bowel sounds, soft nontender to palpation, nondistended Extremities: No clubbing, cyanosis, or edema Skin: No evidence of breakdown, no evidence of rash   Neuro:  pt is alert and oriented x 3. Fair insight and awareness.   Normal language,mild/mod dysarthria.  Left central VII and mild left tongue deviation.  LUE 4-/5 prox to distal.  LLE is 4+/5 prox to distal.  Fine motor finger to thumb intact on left but slower than on right  Sensory exam normal for light touch and pain in all 4 limbs.  No limb ataxia or cerebellar signs.  No abnormal tone appreciated.    Musculoskeletal: Full ROM, No pain with AROM or PROM in the neck, trunk, or extremities. Posture appropriate   Physical exam unchanged from the above on reexamination 01/27/24    Assessment/Plan: 1. Functional deficits which require 3+ hours per day of interdisciplinary therapy in a comprehensive inpatient rehab setting. Physiatrist is providing close team  supervision and 24 hour management of active medical problems listed below. Physiatrist and rehab team continue to assess barriers to discharge/monitor patient progress toward functional and medical goals  Care Tool:  Bathing    Body parts bathed by patient: Left arm, Chest, Abdomen, Front perineal area, Right upper leg, Left upper leg, Right lower leg, Face, Left lower leg, Right arm, Buttocks   Body parts bathed by helper: Right arm, Buttocks     Bathing assist Assist Level: Supervision/Verbal cueing     Upper Body Dressing/Undressing Upper body dressing   What is the patient wearing?: Pull over shirt    Upper body assist Assist Level: Independent    Lower Body Dressing/Undressing Lower body dressing      What is the patient wearing?: Underwear/pull up, Pants     Lower body assist Assist for lower body dressing: Supervision/Verbal cueing     Toileting Toileting    Toileting assist Assist for toileting: Supervision/Verbal cueing     Transfers Chair/bed transfer  Transfers assist     Chair/bed transfer assist level: Contact Guard/Touching assist     Locomotion Ambulation   Ambulation assist      Assist level: Contact Guard/Touching assist Assistive device: Rollator Max distance: 150+   Walk  10 feet activity   Assist     Assist level: Contact Guard/Touching assist Assistive device: Rollator   Walk 50 feet activity   Assist    Assist level: Contact Guard/Touching assist Assistive device: Rollator    Walk 150 feet activity   Assist Walk 150 feet activity did not occur: Safety/medical concerns  Assist level: Contact Guard/Touching assist Assistive device: Rollator    Walk 10 feet on uneven surface  activity   Assist Walk 10 feet on uneven surfaces activity did not occur: Safety/medical concerns         Wheelchair     Assist Is the patient using a wheelchair?: Yes Type of Wheelchair: Manual    Wheelchair assist level:  Supervision/Verbal cueing Max wheelchair distance: 150    Wheelchair 50 feet with 2 turns activity    Assist        Assist Level: Supervision/Verbal cueing   Wheelchair 150 feet activity     Assist      Assist Level: Supervision/Verbal cueing   Blood pressure 120/81, pulse 61, temperature 98.6 F (37 C), resp. rate 18, height 5\' 11"  (1.803 m), weight 98 kg, SpO2 99%.  Medical Problem List and Plan: 1. 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  Impaired mobility -DVT/anticoagulation:  Pharmaceutical: Lovenox - may d/c due to amb status              -antiplatelet therapy: continue Aspirin  81 mg daily and Plavix  75 mg day x 3 weeks then aspirin  alone   3. Pain Management: Tylenol  as needed   4. Mood/Behavior/Sleep: Provide emotional support             -antipsychotic agents: N/A   5. Neuropsych/cognition: This patient is capable of making decisions on his own behalf.   6. Skin/Wound Care: Routine skin checks 7. Fluids/Electrolytes/Nutrition: pt reports good appetite  -5-31: P.o. intakes 100% for most meals.  Appropriate.   8.  Hypertension.  Continue Norvasc  10 mg daily.  Monitor with increased mobility.    - Added avapro  75mg  daily.   -5/30 improved control. Bp's trending down--obsv only today     01/27/2024    5:19 AM 01/26/2024    7:21 PM 01/26/2024   12:57 PM  Vitals with BMI  Systolic 120 112 440  Diastolic 81 74 69  Pulse 61 97 98  Mild diastolic elevation but overall controlled   -6/9 Controlled, continue current   9.  Hyperlipidemia: continue Lipitor   10.  Class II morbid obesity.  BMI 36.26.  Dietary follow-up   11.  Tobacco abuse: continue NicoDerm patch.  Provide counseling   12.   Bilateral conjunctival inflammation.  Continue eyedrops.  Follow-up outpatient Dr.Brasington  Will resume prednisolone  ophthalmic solution.  Orbital CT on 12/29/2023 negative  Much improved today 6/7 13. Screening for vitamin D  deficiency:  vitamin D  level 9  -5/30 begin vitamin D  1000 u daily  14. Urinary urgency--persistent  No retention   -6/9 continent , continue to monitor   15. Slow transit constipation  Improved had incont BM will change senna to  prn   -6/9 Pt reports BM this AM, improved, continue to monitor     LOS: 13 days A FACE TO FACE EVALUATION WAS PERFORMED  Genetta Kenning 01/27/2024, 8:45 AM

## 2024-01-28 MED ORDER — AMLODIPINE BESYLATE 2.5 MG PO TABS
2.5000 mg | ORAL_TABLET | Freq: Every day | ORAL | Status: DC
  Administered 2024-01-29 – 2024-01-30 (×2): 2.5 mg via ORAL
  Filled 2024-01-28 (×2): qty 1

## 2024-01-28 MED ORDER — ALUM & MAG HYDROXIDE-SIMETH 200-200-20 MG/5ML PO SUSP
15.0000 mL | Freq: Four times a day (QID) | ORAL | Status: DC | PRN
  Administered 2024-01-28: 15 mL via ORAL
  Filled 2024-01-28: qty 30

## 2024-01-28 NOTE — Progress Notes (Signed)
 Physical Therapy Session Note  Patient Details  Name: Mark Mcintosh MRN: [id number] Date of Birth: 01-Aug-1971  Today's Date: 01/28/2024 PT Individual Time: 1120-1200; 1307 - 1402 PT Individual Time Calculation (min): 40 min; 55 min   Short Term Goals: Week 2:  PT Short Term Goal 1 (Week 2): = LTGs  SESSION 1 Skilled Therapeutic Interventions/Progress Updates: Patient sitting in WC on entrance to room. Patient alert and agreeable to PT session.   Patient reported no pain during session  Therapeutic Activity: Transfers: Pt performed sit<>stand transfers throughout session in preparation to ambulate modI and with rollator.  6 Min Walk Test:  Instructed patient to ambulate as quickly and as safely as possible for 6 minutes using LRAD. Patient was allowed to take standing rest breaks without stopping the test, but if the patient required a sitting rest break the clock would be stopped and the test would be over.  Results: 890 feet (271.272 meters, Avg speed .36m/s) using a rollator with supervision. Results indicate that the patient has reduced endurance with ambulation compared to age matched norms.  Age Matched Norms: 69-69 yo M: 61 F: 35, 3-79 yo M: 35 F: 471, 67-89 yo M: 417 F: 392 MDC: 58.21 meters (190.98 feet) or 50 meters (ANPTA Core Set of Outcome Measures for Adults with Neurologic Conditions, 2018)  - Pt navigated 8 curb step with no UE support and close supervision (reports step at home in car port is like this, but that pt has B hand rails in front with few steps). Pt navigated 4 (6) steps with supervision and B UE supported with only cue to increase L hip flexion when ascending to avoid catching edge of step on front of shoe. PTA encouraged pt to use front steps with hand rail (pt stat that family will be present following d/c home for a few weeks with PTA also encouraging pt to have pt family walk with pt greater distances than in household with pt understanding)  Gait  Training:  Pt ambulated throughout session using rollator with modI for short distances (less than 50'), and supervision for greater than 50'. Pt with personal shoes donned (hanger rep attached shoe cap on L shoe previously) and VC throughout to increase awareness to when L LE starts to slide on floor, and to increase L hip flexion to avoid.  Patient sitting in WC at end of session with brakes locked, and all needs within reach.  SESSION 2 Skilled Therapeutic Interventions/Progress Updates: Patient sitting in WC on entrance to room. Patient alert and agreeable to PT session.   Patient reported no pain during session.  Therapeutic Activity: Transfers: Pt performed sit<>stand transfers throughout session in preparation with rollator and modI.   - Pt ambulated 500'+ from room<WCC< main gym with overall supervision and in rollator with only one rest break provided in order to increase community ambulatory tolerance. - Pt participated in floor transfer with instructions to recall sequence from previous day with pt able to do so without assistance  Neuromuscular Re-ed: NMR facilitated during session with focus on dynamic standing balance, coordination. - Laterally stepping with red theraband donned B ankles with cues to maintain front of shoe on line of tiles to avoid hip flexor/extensor compensation. Pt performed overall with CGA and added cues to control L LE eccentric vs letting band snap towards midline, and to increase step clearance of L LE when stepping to R. 2 rounds performed with seated rest break required (pt reported some heart burn - reached out  to nsg). - Pt ambulated from main gym<day room gym in rollator with cues to look in various directions while maintaining cadence, and to retro-step/pivot step when cued to do so. Pt did so with close supervision and progressed to no AD with CGA/light minA.  NMR performed for improvements in motor control and coordination, balance, sequencing,  judgement, and self confidence/ efficacy in performing all aspects of mobility at highest level of independence.   Patient sitting in WC at end of session with brakes locked, and all needs within reach.       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) Restrictions Weight Bearing Restrictions Per Provider Order: No    Therapy/Group: Individual Therapy  Edita Weyenberg PTA 01/28/2024, 3:24 PM

## 2024-01-28 NOTE — Progress Notes (Signed)
 Recreational Therapy Session Note  Patient Details  Name: Abhinav Mayorquin MRN: [id number] Date of Birth: [birth year] Today's Date: 01/28/2024 Pain;  no c/o Pt participated in animal assisted activity seated w/c level with supervision.  Pt easily engaged with pet partner team and was appreciative of this visit. Pt response:  Joss Friedel 01/28/2024, 3:18 PM

## 2024-01-28 NOTE — Patient Care Conference (Signed)
 Inpatient RehabilitationTeam Conference and Plan of Care Update Date: 01/30/2024   Time: 10:13 AM    Patient Name: Mark Mcintosh      Medical Record Number: [id number]  Date of Birth: 11/07/1970 Sex: Male         Room/Bed: 4W10C/4W10C-01 Payor Info: Payor: BLUE CROSS BLUE SHIELD / Plan: BCBS COMM PPO / Product Type: *No Product type* /    Admit Date/Time:  01/14/2024  4:12 PM  Primary Diagnosis:  Right pontine cerebrovascular accident [facility])  Hospital Problems: Principal Problem:   Right pontine cerebrovascular accident [facility]) Active Problems:   Coping style affecting medical condition    Expected Discharge Date: Expected Discharge Date: 01/30/24  Team Members Present: Physician leading conference: Dr. Janeece Mechanic Social Worker Present: Norval Been, LCSW Nurse Present: Forrestine Ike, RN PT Present: Seferino Dade, Baxter Bott, PT OT Present: Kenda Paula, OT SLP Present: Reggie Caper, SLP PPS Coordinator present : Jestine Moron, SLP     Current Status/Progress Goal Weekly Team Focus  Bowel/Bladder   pt continent of b/b   Remain continent   Assist with bathroom needs    Swallow/Nutrition/ Hydration   reg/thin   mod i  tolerance, use of strategies    ADL's   set up bathing and dressing, distant S with tub transfers, mod I to toilet, using LUE well with functional tasks, can now do lateral and tip pinch   Mod independent   LUE NMR, dynamic balance, safety awareness with balance, ADL training, pt education    Mobility   ModI transfers w/ rollator; supervision w/ ambulation and curb navigation (w/ RW - will assess with rollator this week).   supervision/modI  pt ed, NMRE, ambulation safety with navigating obstacles, standing balance, curb navigation, preparation for d/c home    Communication   mild dysarthria 2/2 lingual and respiratory weakness   mod i 100% conversation   IOPI, EMST. SLOP    Safety/Cognition/ Behavioral Observations                Pain   C/o back pain 4-5/10   <3 pain scale   Assess qshift and prn    Skin   Skin intact   Remain intact  Assess qshift and prn      Discharge Planning:  Pt will discharge to home with PRN support from his sister and mother. Pt reports that his sons will come in to town to help when he discharges as well. SW will confirm there are no barriers to discharge.   Team Discussion: Patient post right pontine CVA with fine motor control issues.   Patient on target to meet rehab goals: yes, currently working on high level IADLs and floor transfers. Goals set for mod I overall.  *See Care Plan and progress notes for long and short-term goals.   Revisions to Treatment Plan:  IOPI EMST   Teaching Needs: Safety, medications, dietary modification, transfers, toileting, etc.   Current Barriers to Discharge: Decreased caregiver support and Home enviroment access/layout  Possible Resolutions to Barriers: Family education OP follow up services DME: TTB, Rollator     Medical Summary Current Status: Blood pressure well-controlled, mobility is improving, left upper extremity strength improving  Barriers to Discharge: Self-care education   Possible Resolutions to [employer] Focus: Continue stroke prevention education, needs PCP   Continued Need for Acute Rehabilitation Level of Care: The patient requires daily medical management by a physician with specialized training in physical medicine and rehabilitation for the following reasons: Direction of a  multidisciplinary physical rehabilitation program to maximize functional independence : Yes Medical management of patient stability for increased activity during participation in an intensive rehabilitation regime.: Yes Analysis of laboratory values and/or radiology reports with any subsequent need for medication adjustment and/or medical intervention. : Yes   I attest that I was present, lead the team conference, and  concur with the assessment and plan of the team.   Forrestine Ike B 01/30/2024, 10:41 AM

## 2024-01-28 NOTE — Progress Notes (Signed)
 Speech Language Pathology Daily Session Note  Patient Details  Name: Mark Mcintosh MRN: [id number] Date of Birth: 1971/02/10  Today's Date: 01/28/2024 SLP Individual Time: 4098-1191 SLP Individual Time Calculation (min): 45 min  Short Term Goals: Week 2: SLP Short Term Goal 1 (Week 2): STGs=LTGs d/t ELOS  Skilled Therapeutic Interventions: Skilled therapy session focused on communication goals. SLP facilitated session by prompting patient to complete x25 repetitions of EMST (expiratory muscle strength training) at 80cm H2O to target respiratory strength for speech. SLP targeted lingual strength through use of IOPI ([place]  Oral Performance Instrument). Patient completed 30 repetitions of 43 kpa anteriorly and 46 kpa posteriorly. Patient was approximately 100% intelligible at the conversational level this date given mod iA for use of SLOP strategies (slow, loud, over articulate, pause). Patient left in chair with alarm set and call bell in reach. Continue POC  Pain Back pain from bed - transferred to [facility]  Therapy/Group: Individual Therapy  Sharell Hilmer M.A., CCC-SLP 01/28/2024, 7:37 AM

## 2024-01-28 NOTE — Progress Notes (Signed)
 PROGRESS NOTE   Subjective/Complaints:  No issues overnite, pt did well with floor transfers   ROS: Patient denies fever, CP, SOB, N/V/D  Objective:   No results found.  No results for input(s): WBC, HGB, HCT, PLT in the last 72 hours.  No results for input(s): NA, K, CL, CO2, GLUCOSE, BUN, CREATININE, CALCIUM  in the last 72 hours.    Intake/Output Summary (Last 24 hours) at 01/28/2024 0900 Last data filed at 01/28/2024 0716 Gross per 24 hour  Intake 717 ml  Output 1530 ml  Net -813 ml        Physical Exam: Vital Signs Blood pressure 102/73, pulse 75, temperature 98.7 F (37.1 C), temperature source Oral, resp. rate 19, height 5' 11 (1.803 m), weight 98 kg, SpO2 97%.  HEENT.  [place]/AT General: No acute distress Mood and affect are appropriate Heart: Regular rate and rhythm no rubs murmurs or extra sounds Lungs: Clear to auscultation, breathing unlabored, no rales or wheezes Abdomen: Positive bowel sounds, soft nontender to palpation, nondistended Extremities: No clubbing, cyanosis, or edema Skin: No evidence of breakdown, no evidence of rash   Neuro:  pt is alert and oriented x 3. Fair insight and awareness.   Normal language,mild/mod dysarthria.  Left central VII and mild left tongue deviation.  LUE 4-/5 prox to distal.  LLE is 4+/5 prox to distal.  Fine motor finger to thumb intact on left but slower than on right  Sensory exam normal for light touch and pain in all 4 limbs.  No limb ataxia or cerebellar signs.  No abnormal tone appreciated.    Musculoskeletal: Full ROM, No pain with AROM or PROM in the neck, trunk, or extremities. Posture appropriate   Physical exam unchanged from the above on reexamination 01/28/24    Assessment/Plan: 1. Functional deficits which require 3+ hours per day of interdisciplinary therapy in a comprehensive inpatient rehab setting. Physiatrist is  providing close team supervision and 24 hour management of active medical problems listed below. Physiatrist and rehab team continue to assess barriers to discharge/monitor patient progress toward functional and medical goals  Care Tool:  Bathing    Body parts bathed by patient: Left arm, Chest, Abdomen, Front perineal area, Right upper leg, Left upper leg, Right lower leg, Face, Left lower leg, Right arm, Buttocks   Body parts bathed by helper: Right arm, Buttocks     Bathing assist Assist Level: Supervision/Verbal cueing     Upper Body Dressing/Undressing Upper body dressing   What is the patient wearing?: Pull over shirt    Upper body assist Assist Level: Independent    Lower Body Dressing/Undressing Lower body dressing      What is the patient wearing?: Underwear/pull up, Pants     Lower body assist Assist for lower body dressing: Supervision/Verbal cueing     Toileting Toileting    Toileting assist Assist for toileting: Supervision/Verbal cueing     Transfers Chair/bed transfer  Transfers assist     Chair/bed transfer assist level: Contact Guard/Touching assist     Locomotion Ambulation   Ambulation assist      Assist level: Contact Guard/Touching assist Assistive device: Rollator Max distance: 150+  Walk 10 feet activity   Assist     Assist level: Contact Guard/Touching assist Assistive device: Rollator   Walk 50 feet activity   Assist    Assist level: Contact Guard/Touching assist Assistive device: Rollator    Walk 150 feet activity   Assist Walk 150 feet activity did not occur: Safety/medical concerns  Assist level: Contact Guard/Touching assist Assistive device: Rollator    Walk 10 feet on uneven surface  activity   Assist Walk 10 feet on uneven surfaces activity did not occur: Safety/medical concerns         Wheelchair     Assist Is the patient using a wheelchair?: Yes Type of Wheelchair: Manual     Wheelchair assist level: Supervision/Verbal cueing Max wheelchair distance: 150    Wheelchair 50 feet with 2 turns activity    Assist        Assist Level: Supervision/Verbal cueing   Wheelchair 150 feet activity     Assist      Assist Level: Supervision/Verbal cueing   Blood pressure 102/73, pulse 75, temperature 98.7 F (37.1 C), temperature source Oral, resp. rate 19, height 5' 11 (1.803 m), weight 98 kg, SpO2 97%.  Medical Problem List and Plan: 1. Functional deficits secondary to right PLIC, frontal and pontine infarct (01/09/2024) with TEE showing small PFO no plan for closure or intervention             -patient may shower             -ELOS/Goals:  01/30/24            -Continue CIR therapies including PT, OT, and SLP   Team conference today please see physician documentation under team conference tab, met with team  to discuss problems,progress, and goals. Formulized individual treatment plan based on medical history, underlying problem and comorbidities.   2.  Impaired mobility -DVT/anticoagulation:  Pharmaceutical: Lovenox - may d/c due to amb status              -antiplatelet therapy: continue Aspirin  81 mg daily and Plavix  75 mg day x 3 weeks then aspirin  alone   3. Pain Management: Tylenol  as needed   4. Mood/Behavior/Sleep: Provide emotional support             -antipsychotic agents: N/A   5. Neuropsych/cognition: This patient is capable of making decisions on his own behalf.   6. Skin/Wound Care: Routine skin checks 7. Fluids/Electrolytes/Nutrition: pt reports good appetite  -5-31: P.o. intakes 100% for most meals.  Appropriate.   8.  Hypertension.  Continue Norvasc  10 mg daily.  Monitor with increased mobility.    - Added avapro  75mg  daily.   -5/30 improved control. Bp's trending down--obsv only today     01/28/2024    5:49 AM 01/27/2024    8:25 PM 01/27/2024    1:49 PM  Vitals with BMI  Systolic 102 103 89  Diastolic 73 81 74  Pulse 75 96  97  Mild diastolic elevation but overall controlled   -6/9 Controlled, continue current   9.  Hyperlipidemia: continue Lipitor   10.  Class II morbid obesity.  BMI 36.26.  Dietary follow-up   11.  Tobacco abuse: continue NicoDerm patch.  Provide counseling   12.  Bilateral conjunctival inflammation.  Continue eyedrops.  Follow-up outpatient Dr.Brasington  Will resume prednisolone  ophthalmic solution.  Orbital CT on 12/29/2023 negative  Much improved today 6/7 13. Screening for vitamin D  deficiency:  vitamin D  level 9  -5/30 begin vitamin  D 1000 u daily  14. Urinary urgency--persistent  No retention   -6/9 continent , continue to monitor   15. Slow transit constipation  Improved had incont BM will change senna to prn   -6/9 Pt reports BM this AM, improved, continue to monitor     LOS: 14 days A FACE TO FACE EVALUATION WAS PERFORMED  Genetta Kenning 01/28/2024, 9:00 AM

## 2024-01-28 NOTE — Progress Notes (Signed)
 Occupational Therapy Session Note  Patient Details  Name: Mark Mcintosh MRN: [id number] Date of Birth: 12-18-1970  Today's Date: 01/28/2024 OT Individual Time: 0915-1000 OT Individual Time Calculation (min): 45 min    Short Term Goals: Week 2:  OT Short Term Goal 1 (Week 2): STGs = LTGs  Skilled Therapeutic Interventions/Progress Updates:    Pt received in w/c and ready for therapy. Pt ambulated to ADL apt with 4 WW with good motor control and no toe catch observed.   Pt practiced tub bench transfers without A needed.  Reviewed set up of his bathroom.  Pt then worked on sit to stands from low couch to simulate sit to stands from low toilet at home. Pt able to do so with no A.  Had pt practice eccentric sits with no UE support 10x.  This will be a good activity for him to do at home.   In ADL kitchen, he practiced picking out plates from upper cabinets with cues to focus on lateral pinch vs a gross grasp and then using a grasp for cups.   He is able to do a tip pinch prehension for picking up utensil but does not have in hand prehension to turn utensil in his hand.   Reached for food boxes with cues to keep shoulder down vs using a scapular hike.  Overall making good progress with shoulder control.  Discussed practical food prep ideas using counter top toaster oven vs regular oven and to do food prep with containers and dishes he can manage lifting with 1 hand only until L hand develops more strength.    Pt ambulated back to room with all needs met.    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) Restrictions Weight Bearing Restrictions Per Provider Order: No   Pain:  C/o back feeling achy from bed but did not inhibit therapy     Therapy/Group: Individual Therapy  Latavia Goga 01/28/2024, 12:09 PM

## 2024-01-29 ENCOUNTER — Other Ambulatory Visit (HOSPITAL_COMMUNITY): Payer: Self-pay

## 2024-01-29 DIAGNOSIS — R350 Frequency of micturition: Secondary | ICD-10-CM

## 2024-01-29 DIAGNOSIS — K5901 Slow transit constipation: Secondary | ICD-10-CM

## 2024-01-29 MED ORDER — NICOTINE 14 MG/24HR TD PT24
14.0000 mg | MEDICATED_PATCH | Freq: Every day | TRANSDERMAL | 0 refills | Status: AC
  Filled 2024-01-29: qty 21, 21d supply, fill #0

## 2024-01-29 MED ORDER — ALBUTEROL SULFATE HFA 108 (90 BASE) MCG/ACT IN AERS
2.0000 | INHALATION_SPRAY | Freq: Four times a day (QID) | RESPIRATORY_TRACT | 0 refills | Status: DC | PRN
  Filled 2024-01-29: qty 6.7, 25d supply, fill #0

## 2024-01-29 MED ORDER — IRBESARTAN 75 MG PO TABS
75.0000 mg | ORAL_TABLET | Freq: Every day | ORAL | 0 refills | Status: DC
  Filled 2024-01-29: qty 30, 30d supply, fill #0

## 2024-01-29 MED ORDER — VITAMIN D3 25 MCG PO TABS
1000.0000 [IU] | ORAL_TABLET | Freq: Every day | ORAL | 0 refills | Status: DC
Start: 2024-01-29 — End: 2024-02-27
  Filled 2024-01-29: qty 30, 30d supply, fill #0

## 2024-01-29 MED ORDER — NICOTINE 21 MG/24HR TD PT24
21.0000 mg | MEDICATED_PATCH | Freq: Every day | TRANSDERMAL | 0 refills | Status: AC
  Filled 2024-01-29: qty 7, 7d supply, fill #0

## 2024-01-29 MED ORDER — AMLODIPINE BESYLATE 2.5 MG PO TABS
2.5000 mg | ORAL_TABLET | Freq: Every day | ORAL | 0 refills | Status: DC
  Filled 2024-01-29: qty 30, 30d supply, fill #0

## 2024-01-29 MED ORDER — NICOTINE 7 MG/24HR TD PT24
7.0000 mg | MEDICATED_PATCH | Freq: Every day | TRANSDERMAL | 0 refills | Status: AC
  Filled 2024-01-29: qty 21, 21d supply, fill #0

## 2024-01-29 MED ORDER — ATORVASTATIN CALCIUM 40 MG PO TABS
40.0000 mg | ORAL_TABLET | Freq: Every day | ORAL | 0 refills | Status: DC
  Filled 2024-01-29: qty 30, 30d supply, fill #0

## 2024-01-29 NOTE — Plan of Care (Signed)
  Problem: Consults Goal: RH STROKE PATIENT EDUCATION Description: See Patient Education module for education specifics  Outcome: Progressing   Problem: RH BOWEL ELIMINATION Goal: RH STG MANAGE BOWEL WITH ASSISTANCE Description: STG Manage Bowel with mod I Assistance. Outcome: Progressing   Problem: RH BLADDER ELIMINATION Goal: RH STG MANAGE BLADDER WITH ASSISTANCE Description: STG Manage Bladder With  mod I Assistance Outcome: Progressing   Problem: RH SKIN INTEGRITY Goal: RH STG SKIN FREE OF INFECTION/BREAKDOWN Description: Manage skin free of infection/breakdown with mod I assistance Outcome: Progressing   Problem: RH SAFETY Goal: RH STG ADHERE TO SAFETY PRECAUTIONS W/ASSISTANCE/DEVICE Description: STG Adhere to Safety Precautions With  mod I Assistance/Device. Outcome: Progressing   Problem: RH PAIN MANAGEMENT Goal: RH STG PAIN MANAGED AT OR BELOW PT'S PAIN GOAL Description: <4 w/ prns Outcome: Progressing   Problem: RH KNOWLEDGE DEFICIT Goal: RH STG INCREASE KNOWLEDGE OF HYPERTENSION Description: Manage increase knowledge of hypertension with mod I assistance from friend using educational materials provided Outcome: Progressing Goal: RH STG INCREASE KNOWLEDGE OF STROKE PROPHYLAXIS Description: Manage increase knowledge of stroke prophylaxis with mod I assistance from friend using educational materials provided Outcome: Progressing

## 2024-01-29 NOTE — Progress Notes (Signed)
 Inpatient Rehabilitation Discharge Medication Review by a Pharmacist  A complete drug regimen review was completed for this patient to identify any potential clinically significant medication issues.  High Risk Drug Classes Is patient taking? Indication by Medication  Antipsychotic No   Anticoagulant No   Antibiotic No   Opioid No   Antiplatelet No   Hypoglycemics/insulin No   Vasoactive Medication Yes Irbesartan , amlodipine  - HTN  Chemotherapy No   Other Yes Albuterol  inhaler prn SOB Atorvastatin  - HLD Prednisolone  - eye infection Vitamin D  - bones     Type of Medication Issue Identified Description of Issue Recommendation(s)  Drug Interaction(s) (clinically significant)     Duplicate Therapy     Allergy     No Medication Administration End Date     Incorrect Dose     Additional Drug Therapy Needed     Significant med changes from prior encounter (inform family/care partners about these prior to discharge).    Other       Clinically significant medication issues were identified that warrant physician communication and completion of prescribed/recommended actions by midnight of the next day:  No  Name of provider notified for urgent issues identified:   Provider Method of Notification:     Pharmacist comments: None  Time spent performing this drug regimen review (minutes):  20 minutes   Thank you. Lennice Quivers, PharmD

## 2024-01-29 NOTE — Plan of Care (Signed)
  Problem: RH Balance Goal: LTG: Patient will maintain dynamic sitting balance (OT) Description: LTG:  Patient will maintain dynamic sitting balance with assistance during activities of daily living (OT) Outcome: Completed/Met Goal: LTG Patient will maintain dynamic standing with ADLs (OT) Description: LTG:  Patient will maintain dynamic standing balance with assist during activities of daily living (OT)  Outcome: Completed/Met   Problem: Sit to Stand Goal: LTG:  Patient will perform sit to stand in prep for activites of daily living with assistance level (OT) Description: LTG:  Patient will perform sit to stand in prep for activites of daily living with assistance level (OT) Outcome: Completed/Met   Problem: RH Grooming Goal: LTG Patient will perform grooming w/assist,cues/equip (OT) Description: LTG: Patient will perform grooming with assist, with/without cues using equipment (OT) Outcome: Completed/Met   Problem: RH Bathing Goal: LTG Patient will bathe all body parts with assist levels (OT) Description: LTG: Patient will bathe all body parts with assist levels (OT) Outcome: Completed/Met   Problem: RH Dressing Goal: LTG Patient will perform upper body dressing (OT) Description: LTG Patient will perform upper body dressing with assist, with/without cues (OT). Outcome: Completed/Met Goal: LTG Patient will perform lower body dressing w/assist (OT) Description: LTG: Patient will perform lower body dressing with assist, with/without cues in positioning using equipment (OT) Outcome: Completed/Met   Problem: RH Toileting Goal: LTG Patient will perform toileting task (3/3 steps) with assistance level (OT) Description: LTG: Patient will perform toileting task (3/3 steps) with assistance level (OT)  Outcome: Completed/Met   Problem: RH Functional Use of Upper Extremity Goal: LTG Patient will use RT/LT upper extremity as a (OT) Description: LTG: Patient will use right/left upper  extremity as a stabilizer/gross assist/diminished/nondominant/dominant level with assist, with/without cues during functional activity (OT) Outcome: Completed/Met   Problem: RH Simple Meal Prep Goal: LTG Patient will perform simple meal prep w/assist (OT) Description: LTG: Patient will perform simple meal prep with assistance, with/without cues (OT). Outcome: Completed/Met   Problem: RH Toilet Transfers Goal: LTG Patient will perform toilet transfers w/assist (OT) Description: LTG: Patient will perform toilet transfers with assist, with/without cues using equipment (OT) Outcome: Completed/Met   Problem: RH Tub/Shower Transfers Goal: LTG Patient will perform tub/shower transfers w/assist (OT) Description: LTG: Patient will perform tub/shower transfers with assist, with/without cues using equipment (OT) Outcome: Completed/Met

## 2024-01-29 NOTE — Progress Notes (Signed)
 Patient ID: Mark Mcintosh, male   DOB: [birth year], 53 y.o.   MRN: [id number]  SW ordered DME for rollator and TTB with [employer] via parachute.  SW met with pt in room to provide updates from team conference , DME ordered, and preferred outpatient location. Prefers [facility]. SW faxed his ADA link transportation application at his request to [phone].  SW faxed outpatient referral.  Norval Been, MSW, LCSW Office: [phone] Cell: [phone] Fax: [phone]

## 2024-01-29 NOTE — Progress Notes (Signed)
 Speech Language Pathology Discharge Summary  Patient Details  Name: Mark Mcintosh MRN: [id number] Date of Birth: 10/06/1970  Date of Discharge from SLP service:January 29, 2024  Today's Date: 01/29/2024 SLP Individual Time: 1102-1200 SLP Individual Time Calculation (min): 58 min   Skilled Therapeutic Interventions:   Skilled therapy session focused on dysarthria and dysphagia goals. SLP targeted dysarthria goals through re-evaluation of maximum lingual strength in the anterior and posterior position. Patient with a maximim anterior kpa of 64.4 (50kpa at evaluation) and posterior kpa of 67.4 (53 at evaluation). Patient then completed x20 repetitions anteriorly and posteriorly at 55kpa. SLP also re-evaluated maximum expiratory pressure. Patient with a MEP on 92.8cm H2O (73 at evaluation). Patient then completed x25 repetitions of EMST set at 80cm H20. Patient is currently 100% intelligible at the conversational level given mod i A for use of strategies. SLP targeted dysphagia goals through observing patient with regular/thin textures. Patient with adequate mastication times, complete oral clearance and mod i use of strategies. No s/sx of aspiration present. Patient left in bed with call bell in reach.      Patient has met 2 of 2 long term goals.  Patient to discharge at overall Modified Independent level.  Reasons goals not met: n/a   Clinical Impression/Discharge Summary: Pt has made excellent gains and has met 2 of 2 LTG's this admission due to improved dysphagia and dysarthria. Pt is currently an overall mod iA for use of speech intelligibility strategies to reach 100% intelligibility at the conversational level and requires mod i cues for utilization of swallowing compensatory strategies to minimize overt s/sx of aspiration with regular/thin diet. Pt education complete and pt will discharge home with supervision from friends/family/etc. No further ST services warranted.   Care Partner:   Caregiver Able to Provide Assistance: Yes  Type of Caregiver Assistance: Physical  Recommendation:  None      Equipment: n/a   Reasons for discharge: Treatment goals met;Discharged from hospital   Patient/Family Agrees with Progress Made and Goals Achieved: Yes    Mark Mcintosh M.A., CCC-SLP 01/29/2024, 12:18 PM

## 2024-01-29 NOTE — Progress Notes (Signed)
 Occupational Therapy Discharge Summary  Patient Details  Name: Cotton Beckley MRN: [id number] Date of Birth: [birth year]  Date of Discharge from OT service:January 29, 2024  Today's Date: 01/29/2024 OT Individual Time: 4782-9562 OT Individual Time Calculation (min): 75 min   Pt seen this session to ensure he is at an independent to modified independent level with ADLs. Pt used 4WW to ambulate around room and gather his clothing and supplies, pt completed all tasks including toileting and shower with good balance awareness.  No LOB. Pt completed task safely.   Ambulated to gym and practice HEP, provided with written exercises. Pt engage in strength and coordination testing.  Pt has made excellent progress with LUE from only 20% of movement to 90%.  He is using arm at a diminished level.  Recommended the Stroke support group to pt.  Pt returned to room with all needs met.    Patient has met 12 of 12 long term goals due to improved balance, postural control, ability to compensate for deficits, functional use of  LEFT upper and LEFT lower extremity, and improved coordination.  Patient to discharge at overall Modified Independent level.  Patient's care partner is independent to provide the necessary physical assistance at discharge.  His children will stay in the home with him for a few weeks. Family education not needed as pt is independent with explaining his needs to his children.  Pt given a reminder list and a few home exercises to do on days he is not going to outpt therapy.   Reasons goals not met: n/a  Recommendation:  Patient will benefit from ongoing skilled OT services in outpatient setting to continue to advance functional skills in the area of iADL and Vocation.  Equipment: Transfer tub bench  Reasons for discharge: treatment goals met  Patient/family agrees with progress made and goals achieved: Yes  OT Discharge Precautions/Restrictions  Restrictions Weight Bearing  Restrictions Per Provider Order: No   Pain  No c/o pain  ADL ADL Eating: Independent Where Assessed-Eating: Chair Grooming: Independent Where Assessed-Grooming: Standing at sink Upper Body Bathing: Independent Where Assessed-Upper Body Bathing: Shower Lower Body Bathing: Modified independent Where Assessed-Lower Body Bathing: Shower Upper Body Dressing: Independent Where Assessed-Upper Body Dressing: Edge of bed Lower Body Dressing: Modified independent Where Assessed-Lower Body Dressing: Edge of bed Toileting: Independent Where Assessed-Toileting: Teacher, adult education: Engineer, agricultural Method: Proofreader: Engineer, technical sales: Modified independent Web designer Method: Ship broker: Insurance underwriter: Modified independent Film/video editor Method: Designer, industrial/product: Emergency planning/management officer, Grab bars Vision Baseline Vision/History: 1 Wears glasses Patient Visual Report: No change from baseline Vision Assessment?: No apparent visual deficits Perception  Perception: Within Functional Limits Praxis Praxis: WFL Cognition Cognition Overall Cognitive Status: Within Functional Limits for tasks assessed Arousal/Alertness: Awake/alert Memory: Appears intact Attention: Sustained Sustained Attention: Appears intact Awareness: Appears intact Problem Solving: Appears intact Safety/Judgment: Appears intact Brief Interview for Mental Status (BIMS) Repetition of Three Words (First Attempt): 3 Temporal Orientation: Year: Correct Temporal Orientation: Month: Accurate within 5 days Temporal Orientation: Day: Correct Recall: Sock: Yes, no cue required Recall: Blue: Yes, no cue required Recall: Bed: Yes, no cue required BIMS Summary Score: 15 Sensation Sensation Light Touch: Appears Intact Proprioception: Appears Intact Stereognosis: Appears  Intact Coordination Gross Motor Movements are Fluid and Coordinated: No Fine Motor Movements are Fluid and Coordinated: No Coordination and Movement Description: L hemiparesis, motor movement and strength much improved from admission to  allow him to use L side actively at a diminished level Finger Nose Finger Test: able to touch nose with L hand but movement not coordinated or smooth 9 Hole Peg Test: Box and Block : L hand 26 blocks, R hand 56 blocks. unable to do 9 hole test yet due to limited tip pinch/finger prehension Motor  Motor Motor - Discharge Observations: L hemiparesis UE>LE;L hemiparesis, motor movement and strength much improved from admission to allow him to use L side actively at a diminished level Mobility  Bed Mobility Bed Mobility: Supine to Sit;Sit to Supine Supine to Sit: Independent Sit to Supine: Independent Transfers Sit to Stand: Independent with assistive device Stand to Sit: Independent with assistive device  Trunk/Postural Assessment  Postural Control Trunk Control: functional during ADL tasks but needs to improve control for dynamic reach to his L  Balance Static Sitting Balance Static Sitting - Level of Assistance: 7: Independent Dynamic Sitting Balance Dynamic Sitting - Level of Assistance: 7: Independent Static Standing Balance Static Standing - Level of Assistance: 7: Independent Dynamic Standing Balance Dynamic Standing - Level of Assistance: 6: Modified independent (Device/Increase time) Extremity/Trunk Assessment RUE Assessment RUE Assessment: Within Functional Limits LUE Assessment Active Range of Motion (AROM) Comments: sh  flexion to 160 General Strength Comments: L grasp 52 lbs (vs 90 on R);  L lateral pinch 15 lbs (vs 22 lbs)   LUE Body System: Neuro Brunstrum levels for arm and hand: Arm;Hand Brunstrum level for arm: Stage V Relative Independence from Synergy Brunstrum level for hand: Stage V Independence from basic  synergies   FAST-UL Outcome Measure  Hand-to-mouth (HtM) Movement Starting Position: Participant seated on a standard chair without armrests. Trunk leaning on back support of chair. Both hands placed in pronated position on the ipsilateral middle thigh. Feet placed flat on the floor. If participants have any difficulty in understanding instructions (i.e. aphasia) a visual demonstration is suggested. For each of the 5 tasks of the FAST-UL, the subject at first performs the movement with the less affected UL and then with the affected one. The movement can be repeated 3 times and the best score of the three attempts is assigned.   Instructions: Each subject is asked to move the hand towards the mouth, touch it with fingertips and return to the thigh. Motor task occurs without moving the trunk off the back support and without moving the head toward the hand.   Scoring: Clinical score from 0 to 3 is provided by comparing affected side with less affected one as follows: 0 = no movement at all. 1 = The movement task is not completed (less of 50% of the contralateral HtM movement). 2 = The movement task is not completed (more of 50% of the contralateral HtM movement but the mouth is not reached) or the movement task is completed with compensations. If the mouth is touched with the wrist or the palm or the movement is performed with head or trunk compensations (flexion of the head and trunk towards the hand) the score is 2.   3 = movement carried out at 100% of the contralateral HtM movement. HtM occurs with adequate shoulder flexion and abduction, elbow flexion, and forearm supination. The mouth is touched with fingertips.  Patient Score: 3   Reach to Target (RtT) Movement Starting Position: Same starting conditions of HtM movement. Instructions: Each subject is asked to move the hand toward a target (i.e. the hand of the examiner) located in front of the subject in the ipsilateral  workspace at  shoulder height, at a distance corresponding to 100% of the fully extended UL within arm's reach (less affected arm as reference). Participants have to reach, touch the target, and return. Motor task occurs without moving the trunk off the back support. Scoring: Clinical score from 0 to 3 is provided by comparing affected side with less affected one as follows: 0 = no movement at all. 1 = The movement task is not completed (less of 50% of the contralateral RtT movement).  2 = The movement task is not completed (more of 50% of the contralateral RtT movement but the target is not reached) or the movement task is completed with compensations (i.e. the trunk loses contact with the back support of the chair with forward displacement, shoulder flexion occurs with excessive scapular elevation, or shoulder excessive abduction). If the target is reached with trunk or shoulder compensations for inadequate elbow and finger extension the score is 2.  3 = movement performed at 100% of the contralateral RtT. The target is reached with adequate shoulder flexion, elbow, wrist and finger extension.  Patient Score: 3   Prono-supination (PS) Movement Starting Position: Same starting conditions of HtM movement. Instructions: Motor task occurs without moving the trunk anteriorly or laterally, the medial side of the humerus is against the body, the forearm is fully pronated with the hand resting on the thigh. Scoring: Clinical score from 0 to 3 is provided by comparing paretic side with less affected one as follows: 0 = no movement at all. 1 = The movement task is not completed (less of 50% of the contralateral PS movement).  2 = The movement task is not completed (more of 50% of the contralateral PS movement but the forearm is not fully supinated) or the movement task is completed with compensations (i.e. excessive trunk inclination, shoulder abduction). If the movement is completed with compensations at elbow,  shoulder or trunk level the score is 2. 3 = movement performed at 100% of the contralateral PS (complete supination of the forearm with the dorsal part of the hand in contact with the thigh).   Patient Score: 2   Grasp and Release (GaR) Movement Starting position: Participant seated on a standard chair. Hip and knees in 90 flexion, feet flat on the floor. Upper limb (UL) resting on a table in front of the participant with approximately 90 elbow flexion, forearm pronated and fingers in a relaxed extended and adducted position.  Instructions: The subject performs a grasping movement of a cylindrical rigid glass (at least 6 cm diameter) placed proximally to an imaginary line connecting the distal joints of thumb and index finger. The subject is asked to grasp the glass, lift it at least 2 cm (elbow remains in contact with the table), and release it. Scoring:  Clinical score from 0 to 3 is provided by comparing affected side with less affected one as follows: 0 = No movement. The grasp is not possible. 1 = The movement task is not completed (less of 50% of the task). Some prehension is possible but the grasp is not sufficiently stable to lift the object; the grasp can be performed with the use of the less affected hand only to stabilize the glass for inadequate hand/finger opening and the release is not possible. Some hand opening is required otherwise the score is 0. 2 = The movement task is not completed (more of 50% of the task). The object is grasped and lifted but it falls or the task is completed  using alternative grasping strategies (i.e. multi-pulpar, palmar, digito palmar; grasping and releasing of the object is possible with abnormal orientation of the wrist and fingers toward the object and the forearm is lifted off the table). 3 = The task is completed using the expected pattern (normal orientation of fingers or wrist toward the object, the grasp occurs with thumb and fingers in opposition,  forearm supination, elbow flexion; thumb abduction and finger extension to release the object).  Patient Score: 2   Pinch and Release (PaR) Movement Starting position: Same starting conditions of GaR movement The participant performs a PaR movement of a pen placed on a table in the midline of an imaginary line connecting the distal joints of thumb and index finger. The participants asked to pinch the pen with the tips of thumb and index finger, lift it at least 2 cm (elbow remains in contact with the table), and release it. Clinical score from 0 to 3 is provided by comparing affected side with non-affected one as follows: 0 = No movement. The pinch is not possible. 1 = The movement task is not completed (less of 50% of the task). Some prehension is possible but the pinch is not sufficiently stable to lift the object; the pinch occurs with the use of the less affected hand to stabilize the object for inadequate finger opening and the release is not possible. Some fingers movement is required otherwise the score is 0. 2 = The movement task is not completed (more of 50% of the task). The object is pinched and lifted but it falls or the task is completed using alternative pinching strategies (e.g. pinching with all the fingers, tripod pinch, pinching and releasing of the object is possible with abnormal orientation of fingers and wrist toward the object and the forearm is lifted off the table). 3 = The task is completed using the expected pattern (normal orientation of fingers or wrist toward the object, the pinch occurs with opposition of pads of index finger and thumb, and wrist extension).  Patient Score: 3  Total score: 13/15      Jasman Pfeifle 01/29/2024, 12:22 PM

## 2024-01-29 NOTE — Plan of Care (Signed)
  Problem: RH Swallowing Goal: LTG Patient will consume least restrictive diet using compensatory strategies with assistance (SLP) Description: LTG:  Patient will consume least restrictive diet using compensatory strategies with assistance (SLP) Outcome: Completed/Met   Problem: RH Expression Communication Goal: LTG Patient will increase speech intelligibility (SLP) Description: LTG: Patient will increase speech intelligibility at word/phrase/conversation level with cues, % of the time (SLP) Outcome: Completed/Met

## 2024-01-29 NOTE — Progress Notes (Signed)
 PROGRESS NOTE   Subjective/Complaints:  Pt feeling well. Up in room with OT. Trying to reach a solid mod I before he leaves. Pt had transportation paperwork for me to complete  ROS: Patient denies fever, rash, sore throat, blurred vision, dizziness, nausea, vomiting, diarrhea, cough, shortness of breath or chest pain, joint or back/neck pain, headache, or mood change.   Objective:   No results found.  No results for input(s): WBC, HGB, HCT, PLT in the last 72 hours.  No results for input(s): NA, K, CL, CO2, GLUCOSE, BUN, CREATININE, CALCIUM  in the last 72 hours.    Intake/Output Summary (Last 24 hours) at 01/29/2024 0940 Last data filed at 01/29/2024 0604 Gross per 24 hour  Intake 237 ml  Output 1450 ml  Net -1213 ml        Physical Exam: Vital Signs Blood pressure 113/80, pulse 83, temperature 98.6 F (37 C), temperature source Oral, resp. rate 18, height 5' 11 (1.803 m), weight 98 kg, SpO2 99%.  Constitutional: No distress . Vital signs reviewed. HEENT: NCAT, EOMI, oral membranes moist Neck: supple Cardiovascular: RRR without murmur. No JVD    Respiratory/Chest: CTA Bilaterally without wheezes or rales. Normal effort    GI/Abdomen: BS +, non-tender, non-distended Ext: no clubbing, cyanosis, or edema Psych: pleasant and cooperative  Skin: No evidence of breakdown, no evidence of rash Neuro:  pt is alert and oriented x 3. Fair insight and awareness.   Normal language,mild/mod dysarthria.  Left central VII and mild left tongue deviation--improving.  LUE 4- to 4/5 prox to distal.  LLE is 4+/5 prox to distal.  Fine motor finger to thumb intact on left but slower than on right  Sensory exam normal for light touch and pain in all 4 limbs.  No limb ataxia or cerebellar signs. Good standing balance with RW No abnormal tone appreciated.    Musculoskeletal: Full ROM, No pain with AROM or PROM  in the neck, trunk, or extremities. Posture appropriate   Assessment/Plan: 1. Functional deficits which require 3+ hours per day of interdisciplinary therapy in a comprehensive inpatient rehab setting. Physiatrist is providing close team supervision and 24 hour management of active medical problems listed below. Physiatrist and rehab team continue to assess barriers to discharge/monitor patient progress toward functional and medical goals  Care Tool:  Bathing    Body parts bathed by patient: Left arm, Chest, Abdomen, Front perineal area, Right upper leg, Left upper leg, Right lower leg, Face, Left lower leg, Right arm, Buttocks   Body parts bathed by helper: Right arm, Buttocks     Bathing assist Assist Level: Independent with assistive device     Upper Body Dressing/Undressing Upper body dressing   What is the patient wearing?: Pull over shirt    Upper body assist Assist Level: Independent    Lower Body Dressing/Undressing Lower body dressing      What is the patient wearing?: Underwear/pull up, Pants     Lower body assist Assist for lower body dressing: Independent with assitive device     Toileting Toileting    Toileting assist Assist for toileting: Independent     Transfers Chair/bed transfer  Transfers assist  Chair/bed transfer assist level: Independent with assistive device     Locomotion Ambulation   Ambulation assist      Assist level: Supervision/Verbal cueing Assistive device: Rollator Max distance: 150+   Walk 10 feet activity   Assist     Assist level: Independent with assistive device Assistive device: Rollator   Walk 50 feet activity   Assist    Assist level: Independent with assistive device Assistive device: Rollator    Walk 150 feet activity   Assist Walk 150 feet activity did not occur: Safety/medical concerns  Assist level: Supervision/Verbal cueing Assistive device: Rollator    Walk 10 feet on uneven  surface  activity   Assist Walk 10 feet on uneven surfaces activity did not occur: Safety/medical concerns   Assist level: Independent with assistive device Assistive device: Rollator   Wheelchair     Assist Is the patient using a wheelchair?: No Type of Wheelchair: Manual    Wheelchair assist level: Supervision/Verbal cueing Max wheelchair distance: 150    Wheelchair 50 feet with 2 turns activity    Assist        Assist Level: Supervision/Verbal cueing   Wheelchair 150 feet activity     Assist      Assist Level: Supervision/Verbal cueing   Blood pressure 113/80, pulse 83, temperature 98.6 F (37 C), temperature source Oral, resp. rate 18, height 5' 11 (1.803 m), weight 98 kg, SpO2 99%.  Medical Problem List and Plan: 1. 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 Complete paperwork for transport for patient  2.  Impaired mobility -DVT/anticoagulation:  Pharmaceutical: Lovenox - may d/c due to amb status              -antiplatelet therapy: continue Aspirin  81 mg daily and Plavix  75 mg day x 3 weeks then aspirin  alone   3. Pain Management: Tylenol  as needed   4. Mood/Behavior/Sleep: Provide emotional support             -antipsychotic agents: N/A   5. Neuropsych/cognition: This patient is capable of making decisions on his own behalf.   6. Skin/Wound Care: Routine skin checks 7. Fluids/Electrolytes/Nutrition: pt reports good appetite  -5-31: P.o. intakes 100% for most meals.  Appropriate.   8.  Hypertension.  Continue Norvasc  10 mg daily.  Monitor with increased mobility.    - Added avapro  75mg  daily.      01/29/2024    5:00 AM 01/28/2024    7:42 PM 01/28/2024    3:01 PM  Vitals with BMI   Systolic 113 106 119  Diastolic 80 71 90  Pulse 83 90 91  Mild diastolic elevation but overall controlled   -6/12 great bp control!  9.  Hyperlipidemia: continue Lipitor   10.  Class II morbid obesity.  BMI 36.26.  Dietary follow-up   11.  Tobacco abuse: continue NicoDerm patch.  Provide counseling   12.  Bilateral conjunctival inflammation.  Continue eyedrops.  Follow-up outpatient Dr.Brasington  Will resume prednisolone  ophthalmic solution.  Orbital CT on 12/29/2023 negative  -resolved 13. Screening for vitamin D  deficiency:  vitamin D  level 9  -5/30  begin vitamin D  1000 u daily  14. Urinary urgency--   No retention   -is now continent    15. Slow transit constipation  Improved had incont BM will change senna to prn   --last bm 6/10--will need to be conscious of this when he goes home     LOS: 15 days A FACE TO FACE EVALUATION WAS PERFORMED  Rawland Caddy 01/29/2024, 9:40 AM

## 2024-01-29 NOTE — Progress Notes (Signed)
 Physical Therapy Discharge Summary  Patient Details  Name: Mark Mcintosh MRN: [id number] Date of Birth: 03/16/1971  Date of Discharge from PT service:January 29, 2024  Patient has met 9 of 9 long term goals due to improved activity tolerance, improved balance, improved postural control, increased strength, decreased pain, ability to compensate for deficits, functional use of  left upper extremity and left lower extremity, and improved coordination.  Patient to discharge at an ambulatory level Modified Independent.   Patient's care partner is independent to provide the necessary intermittent assistance at discharge.  Reasons goals not met: n/a - goal met at this time.  Recommendation:  Patient will benefit from ongoing skilled PT services in outpatient setting to continue to advance safe functional mobility, address ongoing impairments in strength, balance, endurance, functional mobility, and minimize fall risk.  Equipment: Rollator  Reasons for discharge: treatment goals met and discharge from hospital  Patient/family agrees with progress made and goals achieved: Yes  PT Discharge Precautions/Restrictions Precautions Precautions: Fall Restrictions Weight Bearing Restrictions Per Provider Order: No Pain Pain Assessment Pain Scale: 0-10 Pain Score: 0-No pain Pain Interference Pain Interference Pain Effect on Sleep: 1. Rarely or not at all Pain Interference with Therapy Activities: 1. Rarely or not at all Pain Interference with Day-to-Day Activities: 1. Rarely or not at all Vision/Perception  Vision - History Ability to See in Adequate Light: 0 Adequate  Cognition Overall Cognitive Status: Within Functional Limits for tasks assessed Arousal/Alertness: Awake/alert Orientation Level: Oriented X4 Attention: Sustained Sustained Attention: Appears intact Memory: Appears intact Awareness: Appears intact Problem Solving: Appears intact Safety/Judgment: Appears  intact Sensation Sensation Light Touch: Appears Intact Hot/Cold: Appears Intact Proprioception: Appears Intact Stereognosis: Appears Intact Coordination Gross Motor Movements are Fluid and Coordinated: Yes Motor  Motor Motor: Other (comment) (Hemiparesis) Motor - Discharge Observations: L hemiparesis UE>LE; L LE coordination and motor control has significantly improved since admisssion  Mobility Bed Mobility Bed Mobility: Supine to Sit;Sit to Supine Supine to Sit: Independent Sit to Supine: Independent Transfers Transfers: Sit to Stand;Stand Pivot Transfers;Stand to Sit Sit to Stand: Independent with assistive device Stand to Sit: Independent with assistive device Stand Pivot Transfers: Independent with assistive device Transfer (Assistive device): Rollator Locomotion  Gait Ambulation: Yes Gait Assistance: Supervision/Verbal cueing Gait Distance (Feet): 150 Feet Assistive device: Rollator Gait Gait: Yes Gait Pattern: Impaired Gait Pattern: Step-through pattern;Poor foot clearance - left;Decreased weight shift to right;Decreased hip/knee flexion - left Gait velocity: decreased Stairs / Additional Locomotion Stairs: Yes Stairs Assistance: Supervision/Verbal cueing Stair Management Technique: Two rails Number of Stairs: 12 Height of Stairs: 6 Curb: Supervision/Verbal cueing Pick up small object from the floor assist level: Supervision/Verbal cueing Wheelchair Mobility Wheelchair Mobility: No  Trunk/Postural Assessment  Decreased with functional reaching or higher level dynamic tasks without UE support.  Balance Balance Balance Assessed: Yes Standardized Balance Assessment Standardized Balance Assessment: Berg Balance Test Berg Balance Test Sit to Stand: Able to stand without using hands and stabilize independently Standing Unsupported: Able to stand 2 minutes with supervision Sitting with Back Unsupported but Feet Supported on Floor or Stool: Able to sit safely and  securely 2 minutes Stand to Sit: Sits safely with minimal use of hands Transfers: Able to transfer safely, minor use of hands Standing Unsupported with Eyes Closed: Able to stand 10 seconds with supervision Standing Ubsupported with Feet Together: Able to place feet together independently and stand 1 minute safely From Standing, Reach Forward with Outstretched Arm: Can reach forward >12 cm safely (5) From Standing Position,  Pick up Object from Floor: Able to pick up shoe, needs supervision From Standing Position, Turn to Look Behind Over each Shoulder: Looks behind from both sides and weight shifts well Turn 360 Degrees: Able to turn 360 degrees safely but slowly Standing Unsupported, Alternately Place Feet on Step/Stool: Able to complete 4 steps without aid or supervision Standing Unsupported, One Foot in Front: Needs help to step but can hold 15 seconds Standing on One Leg: Able to lift leg independently and hold equal to or more than 3 seconds Total Score: 43 Static Sitting Balance Static Sitting - Balance Support: Feet supported Static Sitting - Level of Assistance: 7: Independent Dynamic Sitting Balance Dynamic Sitting - Balance Support: Feet supported Dynamic Sitting - Level of Assistance: 7: Independent Static Standing Balance Static Standing - Balance Support: Bilateral upper extremity supported Static Standing - Level of Assistance: 6: Modified independent (Device/Increase time) Dynamic Standing Balance Dynamic Standing - Balance Support: During functional activity;Left upper extremity supported Dynamic Standing - Level of Assistance: 6: Modified independent (Device/Increase time) Dynamic Standing - Balance Activities: Reaching across midline Extremity Assessment  RLE Assessment RLE Assessment: Within Functional Limits LLE Assessment LLE Assessment: Exceptions to [facility] Strength Comments: Not formally assessed. Grossly 4/5 functionally   Dominic Sandoval  PTA 01/29/2024, 8:26 AM  Ouida Bloom, PT, DPT, CBIS 01/30/24

## 2024-01-29 NOTE — Progress Notes (Signed)
 Physical Therapy Session Note  Patient Details  Name: Mark Mcintosh MRN: [id number] Date of Birth: [birth year]  Today's Date: 01/29/2024 PT Individual Time: 1350-1502 PT Individual Time Calculation (min): 72 min   Short Term Goals: Week 2:  PT Short Term Goal 1 (Week 2): = LTGs  Skilled Therapeutic Interventions/Progress Updates: Patient sitting in WC on entrance to room. Patient alert and agreeable to PT session.   Patient reported no pain during session. Pt set to d/c tomorrow with no further questions or concerns. Pt encouraged to look further into stroke support group offered through [facility] to assist with integrating back into the community. Pt performed all sit<>stand transfers in preparation for ambulation modI with rollator. Pt stated desire to have further information about stroke support group at [place] and at [facility]. Pt ambulated throughout 4th floor (rehab dept.) to main gym to go over HEP. Pt ambulated outside of [facility] and around to [facility] (1000'+) and back up to room with one rest break with rollator and supervision for long distances due to L LE potential to catch floor (shoe cap provided by hanger). Pt self correct decrease in L step clearance with only min cues throughout entire session. See d/c for further details.   Access Code: [email or web address] URL: [email or web address] Date: 01/29/2024 Prepared by: Seferino Dade  Exercises - Supine Piriformis Stretch with Foot on Ground  - 1 x daily - 7 x weekly - 3 sets - 10 reps - Side Stepping with Resistance at Ankles and Counter Support  - 1 x daily - 7 x weekly - 3 sets - 10 reps - Sit to Stand with Arms Crossed (R LE extended modification)  - 1 x daily - 7 x weekly - 3 sets - 10 reps - Step to 2nd Step with Hand Rail Support  - 1 x daily - 7 x weekly - 3 sets - 10 reps  Patient demonstrates increased fall risk as noted by score of 43/56 on Berg Balance Scale.  (<36= high risk for falls, close to 100%; 37-45  significant >80%; 46-51 moderate >50%; 52-55 lower >25%)  - Pt has significantly improved BERG Balance Score from 17/56 at evaluation to score noted above!  Patient sitting in WC at end of session with brakes locked, modI in room and all needs within reach.      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) Restrictions Weight Bearing Restrictions Per Provider Order: No   Therapy/Group: Individual Therapy  Carnella Fryman PTA 01/29/2024, 3:19 PM

## 2024-01-30 ENCOUNTER — Other Ambulatory Visit (HOSPITAL_COMMUNITY): Payer: Self-pay

## 2024-01-30 NOTE — Progress Notes (Signed)
 PROGRESS NOTE   Subjective/Complaints:  In good spirits. Was on hold with DME company as he was told he needed to make a copay on rollator for home.   ROS: Patient denies fever, rash, sore throat, blurred vision, dizziness, nausea, vomiting, diarrhea, cough, shortness of breath or chest pain, joint or back/neck pain, headache, or mood change.    Objective:   No results found.  No results for input(s): WBC, HGB, HCT, PLT in the last 72 hours.  No results for input(s): NA, K, CL, CO2, GLUCOSE, BUN, CREATININE, CALCIUM  in the last 72 hours.    Intake/Output Summary (Last 24 hours) at 01/30/2024 0859 Last data filed at 01/30/2024 0700 Gross per 24 hour  Intake 954 ml  Output 850 ml  Net 104 ml        Physical Exam: Vital Signs Blood pressure 112/84, pulse 61, temperature 98 F (36.7 C), temperature source Oral, resp. rate 18, height 5' 11 (1.803 m), weight 98 kg, SpO2 94%.  Constitutional: No distress . Vital signs reviewed. HEENT: NCAT, EOMI, oral membranes moist Neck: supple Cardiovascular: RRR without murmur. No JVD    Respiratory/Chest: CTA Bilaterally without wheezes or rales. Normal effort    GI/Abdomen: BS +, non-tender, non-distended Ext: no clubbing, cyanosis, or edema Psych: pleasant and cooperative   Skin: No evidence of breakdown, no evidence of rash Neuro:  pt is alert and oriented x 3. Fair insight and awareness.   Normal language,mild/mod dysarthria ongoing.  Left central VII and mild left tongue deviation--remains.  LUE 4- to 4/5 prox to distal.  LLE is 4+/5 prox to distal.  Fine motor finger to thumb intact on left but slower than on right  Sensory exam normal for light touch and pain in all 4 limbs.  No limb ataxia or cerebellar signs. Excellent sitting balance EOB No abnormal tone appreciated.    Musculoskeletal: Full ROM, No pain with AROM or PROM in the neck, trunk,  or extremities. Posture appropriate   Assessment/Plan: 1. Functional deficits which require 3+ hours per day of interdisciplinary therapy in a comprehensive inpatient rehab setting. Physiatrist is providing close team supervision and 24 hour management of active medical problems listed below. Physiatrist and rehab team continue to assess barriers to discharge/monitor patient progress toward functional and medical goals  Care Tool:  Bathing    Body parts bathed by patient: Left arm, Chest, Abdomen, Front perineal area, Right upper leg, Left upper leg, Right lower leg, Face, Left lower leg, Right arm, Buttocks   Body parts bathed by helper: Right arm, Buttocks     Bathing assist Assist Level: Independent with assistive device     Upper Body Dressing/Undressing Upper body dressing   What is the patient wearing?: Pull over shirt    Upper body assist Assist Level: Independent    Lower Body Dressing/Undressing Lower body dressing      What is the patient wearing?: Underwear/pull up, Pants     Lower body assist Assist for lower body dressing: Independent with assitive device     Toileting Toileting    Toileting assist Assist for toileting: Independent     Transfers Chair/bed transfer  Transfers assist  Chair/bed transfer assist level: Independent with assistive device     Locomotion Ambulation   Ambulation assist      Assist level: Supervision/Verbal cueing Assistive device: Rollator Max distance: 150+   Walk 10 feet activity   Assist     Assist level: Independent with assistive device Assistive device: Rollator   Walk 50 feet activity   Assist    Assist level: Independent with assistive device Assistive device: Rollator    Walk 150 feet activity   Assist Walk 150 feet activity did not occur: Safety/medical concerns  Assist level: Supervision/Verbal cueing Assistive device: Rollator    Walk 10 feet on uneven surface   activity   Assist Walk 10 feet on uneven surfaces activity did not occur: Safety/medical concerns   Assist level: Independent with assistive device Assistive device: Rollator   Wheelchair     Assist Is the patient using a wheelchair?: No Type of Wheelchair: Manual    Wheelchair assist level: Supervision/Verbal cueing Max wheelchair distance: 150    Wheelchair 50 feet with 2 turns activity    Assist        Assist Level: Supervision/Verbal cueing   Wheelchair 150 feet activity     Assist      Assist Level: Supervision/Verbal cueing   Blood pressure 112/84, pulse 61, temperature 98 F (36.7 C), temperature source Oral, resp. rate 18, height 5' 11 (1.803 m), weight 98 kg, SpO2 94%.  Medical Problem List and Plan: 1. 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 Needs follow up with primary, neuro, and Dr. Linnell Richardson  2.  Impaired mobility -DVT/anticoagulation:  Pharmaceutical: Lovenox - may d/c due to amb status              -antiplatelet therapy: continue Aspirin  81 mg daily and Plavix  75 mg day x 3 weeks then aspirin  alone   3. Pain Management: Tylenol  as needed   4. Mood/Behavior/Sleep: Provide emotional support             -antipsychotic agents: N/A   5. Neuropsych/cognition: This patient is capable of making decisions on his own behalf.   6. Skin/Wound Care: Routine skin checks 7. Fluids/Electrolytes/Nutrition: pt reports good appetite  -excellent PO intake   8.  Hypertension.  Continue Norvasc  10 mg daily.  Monitor with increased mobility.    - Added avapro  75mg  daily.      01/30/2024    5:42 AM 01/29/2024    8:08 PM 01/29/2024    4:04 PM  Vitals with BMI  Systolic 112 118 960  Diastolic 84 60 77  Pulse 61 67 98  Mild diastolic elevation but overall  controlled   -6/13 bp well controlled  9.  Hyperlipidemia: continue Lipitor   10.  Class II morbid obesity.  BMI 36.26.  Dietary follow-up   11.  Tobacco abuse: continue NicoDerm patch.  Provide counseling   12.  Bilateral conjunctival inflammation.  Continue eyedrops.  Follow-up outpatient Dr.Brasington  Will resume prednisolone  ophthalmic solution.  Orbital CT on 12/29/2023 negative  -resolved 13. Screening for vitamin D  deficiency:  vitamin D  level 9  -5/30 begin vitamin D  1000 u daily  14. Urinary urgency--  No retention   -improved, continent    15. Slow transit constipation  Improved had incont BM will change senna to prn   --last bm 6/10--discussed with him that he needs to be conscious of this when he goes home     LOS: 16 days A FACE TO FACE EVALUATION WAS PERFORMED  Rawland Caddy 01/30/2024, 8:59 AM

## 2024-02-02 NOTE — Progress Notes (Signed)
 Inpatient Rehabilitation Care Coordinator Discharge Note   Patient Details  Name: Mark Mcintosh MRN: [id number] Date of Birth: Aug 27, 1970   Discharge location: D/c to home  Length of Stay: 15 days  Discharge activity level: Mod I  Home/community participation: Limited  Patient response WU:[id number] Literacy - How often do you need to have someone help you when you read instructions, pamphlets, or other written material from your doctor or pharmacy?: Never  Patient response YN:WGNFAO Isolation - How often do you feel lonely or isolated from those around you?: Rarely  Services provided included: MD, RD, PT, OT, SLP, RN, CM, Neuropsych, SW, Pharmacy, TR  Financial Services:  Field seismologist Utilized: [employer]  Choices offered to/list presented to: patient  Follow-up services arranged:  Outpatient, DME    Outpatient Servicies: [facility] for PT/OT DME : [employer] for rollator    Patient response to transportation need: Is the patient able to respond to transportation needs?: Yes In the past 12 months, has lack of transportation kept you from medical appointments or from getting medications?: No In the past 12 months, has lack of transportation kept you from meetings, work, or from getting things needed for daily living?: No   Patient/Family verbalized understanding of follow-up arrangements:  Yes  Individual responsible for coordination of the follow-up plan: contact pt  Confirmed correct DME delivered: Rennis Case 02/02/2024    Comments (or additional information):  Summary of Stay    Date/Time Discharge Planning CSW  01/30/24 0935 Pt will discharge to home with PRN support from his sister and mother. Pt reports that his sons will come in to town to help when he discharges as well. SW will confirm there are no barriers to discharge. AAC  01/21/24 1005 Pt will discharge to home with PRN support from his sister and mother.  SW will confirm there are no barriers to discharge. AAC       Shandricka Monroy A [name]

## 2024-02-03 ENCOUNTER — Telehealth: Payer: Self-pay

## 2024-02-03 NOTE — Telephone Encounter (Addendum)
 Transitional Care Call--who you spoke with Angela Barban   Are you/is patient experiencing any problems since coming home? No. Are there any questions regarding any aspect of care? No Are there any questions regarding medications administration/dosing? No. Are meds being taken as prescribed? Yes. Patient should review meds with caller to confirm. Medications confirmed.  Have there been any falls? No. Has Home Health been to the house and/or have they contacted you? Patient has Outpatient therapy. He is requesting in-home therapies.  He has been advised to check with his insurance for coverage.  If not, have you tried to contact them? He will call to see if he can receive in-home therapy because of transportation. Can we help you contact them?  No. Are bowels and bladder emptying properly? Yes.   Are there any unexpected incontinence issues? No.  If applicable, is patient following bowel/bladder programs? N/A.  Any fevers, problems with breathing, unexpected pain? No. Are there any skin problems or new areas of breakdown? No. Has the patient/family member arranged specialty MD follow up (ie cardiology/neurology/renal/surgical/etc)? No. He has been reminded of the needed follow ups.  Can we help arrange? No need. Does the patient need any other services or support that we can help arrange? Patient does not have transportation to the [place] appointment. He has been advised to call his insurance, BCBS to see if they can help. Or seek medical transport. Are caregivers following through as expected in assisting the patient? Yes.        11. Has the patient quit smoking, drinking alcohol, or using drugs as recommended? Patient advised.   Appointment  [address] on 02/12/2024 with Ford Ide NP at 3 PM.  Patient will have a video-visit with Ford Ide NP on 02/11/2024 at  8:40 am. Patient advised to log-in at  8:15 am.

## 2024-02-11 ENCOUNTER — Encounter: Payer: Self-pay | Admitting: Registered Nurse

## 2024-02-11 ENCOUNTER — Encounter: Attending: Registered Nurse | Admitting: Registered Nurse

## 2024-02-11 VITALS — Ht 71.0 in

## 2024-02-11 DIAGNOSIS — E7849 Other hyperlipidemia: Secondary | ICD-10-CM

## 2024-02-11 DIAGNOSIS — I6350 Cerebral infarction due to unspecified occlusion or stenosis of unspecified cerebral artery: Secondary | ICD-10-CM | POA: Diagnosis not present

## 2024-02-11 DIAGNOSIS — I10 Essential (primary) hypertension: Secondary | ICD-10-CM

## 2024-02-11 NOTE — Progress Notes (Signed)
 Subjective:    Patient ID: Mark Mcintosh, male    DOB: 1971-02-16, 53 y.o.   MRN: [id number]  HPI: Mark Mcintosh is a 53 y.o. male who is scheduled for video visit and changed to  telephone Transitional Care Visit, he was having transportation issues, for follow up of his  Right Pontine Cerebrovascular Accident, Essential Hypertension and Hyperlipidemia. He presented to [facility] on 01/09/2024 with acute onset o left- sided weakness.   Dr. Cleatus H&P HPI: Mark Mcintosh is a 53 y.o. male with medical history significant for hypertension and tobacco use disorder being admitted for an acute stroke.  He was in his usual state of health until the day prior when leaving work he felt overall weak and had to be helped to his car.  He went home and awoke this morning still feeling weak but noted that he was more weak on the left side which prompted the visit to the emergency room. ED course and data review: Initially with normal BP but went as high as 146/119.  Vitals otherwise unremarkable  MR Brain WO Contrast MPRESSION: 1. Acute infarcts in the posterior limb of the right internal capsule, the overlying right frontal white matter, and pons. Given involvement of multiple vascular territories, consider an embolic etiology. 2. Multiple remote infarcts and chronic microvascular ischemic disease, detailed above.  CTA:  MPRESSION: 1. No acute intracranial hemorrhage or mass effect. 2. No large vessel occlusion, hemodynamically significant stenosis, or aneurysm in the head or neck. 3. Remote lacunar infarcts in the genu of the left internal capsule, globus pallidus, and right corona radiata. 4. Acute infarct involving the posterior limb of the right internal capsule is less well appreciated at CT. 5. Moderately advanced periventricular white matter disease for age.  Neurology Consulted and Recommended : ASA and Plavix  x 3 weeks then aspirin  alone.   He was admitted to inpatient  rehabilitation on 01/14/2024 and discharged home on 01/30/2024,      Pain Inventory Average Pain 0 Pain Right Now 0 My pain is no pain  In the last 24 hours, has pain interfered with the following? General activity 0 Relation with others 0 Enjoyment of life 0 What TIME of day is your pain at its worst? No pain Sleep (in general) Fair  Pain is worse with: no pain Pain improves with: no pain Relief from Meds: no pain  No family history on file. Social History   Socioeconomic History   Marital status: Single    Spouse name: Not on file   Number of children: Not on file   Years of education: Not on file   Highest education level: Not on file  Occupational History   Not on file  Tobacco Use   Smoking status: Every Day    Current packs/day: 1.00    Types: Cigarettes, Cigars   Smokeless tobacco: Never  Vaping Use   Vaping status: Never Used  Substance and Sexual Activity   Alcohol use: Not Currently    Alcohol/week: 2.0 standard drinks of alcohol    Types: 2 Cans of beer per week   Drug use: Yes    Types: Marijuana   Sexual activity: Not on file  Other Topics Concern   Not on file  Social History Narrative   Not on file   Social Drivers of Health   Financial Resource Strain: Not on file  Food Insecurity: No Food Insecurity (01/10/2024)   Hunger Vital Sign    Worried About Running Out of Food in  the Last Year: Never true    Ran Out of Food in the Last Year: Never true  Transportation Needs: No Transportation Needs (01/10/2024)   PRAPARE - Administrator, Civil Service (Medical): No    Lack of Transportation (Non-Medical): No  Physical Activity: Not on file  Stress: Not on file  Social Connections: Not on file   Past Surgical History:  Procedure Laterality Date   ABDOMINAL SURGERY     TEE WITHOUT CARDIOVERSION N/A 01/14/2024   Procedure: ECHOCARDIOGRAM, TRANSESOPHAGEAL;  Surgeon: Alluri, Keller BROCKS, MD;  Location: [facility] ORS;  Service: Cardiovascular;   Laterality: N/A;   Past Surgical History:  Procedure Laterality Date   ABDOMINAL SURGERY     TEE WITHOUT CARDIOVERSION N/A 01/14/2024   Procedure: ECHOCARDIOGRAM, TRANSESOPHAGEAL;  Surgeon: Alluri, Keller BROCKS, MD;  Location: [facility] ORS;  Service: Cardiovascular;  Laterality: N/A;   Past Medical History:  Diagnosis Date   Arthritis    Hypertension    There were no vitals taken for this visit.  Opioid Risk Score:   Fall Risk Score:  `1  Depression screen PHQ 2/9      No data to display          Review of Systems  Musculoskeletal:  Positive for gait problem.       Left side and left arm numb sometimes  Neurological:  Positive for numbness.  All other systems reviewed and are negative.       Objective:   Physical Exam Vitals and nursing note reviewed.  Musculoskeletal:     Comments: No Physical Exam: telephone Visit           Assessment & Plan:  Right Pontine Cerebrovascular Accident,: He has a scheduled appointment with Neurology,. [facility] was called and he has scheduled appointment.   Essential Hypertension: Continue current medication regimen . He has a scheduled appointment with PCP on 02/27/2024. Continue to Monitor.  Hyperlipidemia.Continue current medication regimen, He has a scheduled appointment with PCP on 097/06/2024. Continue to Monitor.  F/U with Dr Carilyn in 4-6 weeks

## 2024-02-12 ENCOUNTER — Encounter: Admitting: Registered Nurse

## 2024-02-17 ENCOUNTER — Ambulatory Visit: Attending: Physician Assistant

## 2024-02-17 ENCOUNTER — Other Ambulatory Visit
Admission: RE | Admit: 2024-02-17 | Discharge: 2024-02-17 | Disposition: A | Discharge status: Home / Self Care | Source: Ambulatory Visit | Attending: Ophthalmology | Admitting: Ophthalmology

## 2024-02-17 ENCOUNTER — Ambulatory Visit

## 2024-02-17 DIAGNOSIS — R262 Difficulty in walking, not elsewhere classified: Secondary | ICD-10-CM | POA: Diagnosis present

## 2024-02-17 DIAGNOSIS — M6281 Muscle weakness (generalized): Secondary | ICD-10-CM

## 2024-02-17 DIAGNOSIS — I639 Cerebral infarction, unspecified: Secondary | ICD-10-CM

## 2024-02-17 DIAGNOSIS — R278 Other lack of coordination: Secondary | ICD-10-CM | POA: Insufficient documentation

## 2024-02-17 DIAGNOSIS — R1312 Dysphagia, oropharyngeal phase: Secondary | ICD-10-CM | POA: Diagnosis present

## 2024-02-17 DIAGNOSIS — H209 Unspecified iridocyclitis: Secondary | ICD-10-CM | POA: Diagnosis present

## 2024-02-17 DIAGNOSIS — R2689 Other abnormalities of gait and mobility: Secondary | ICD-10-CM | POA: Diagnosis present

## 2024-02-17 DIAGNOSIS — R2681 Unsteadiness on feet: Secondary | ICD-10-CM | POA: Diagnosis present

## 2024-02-17 DIAGNOSIS — R471 Dysarthria and anarthria: Secondary | ICD-10-CM | POA: Diagnosis present

## 2024-02-17 LAB — CBC
HCT: 41.5 % (ref 39.0–52.0)
Hemoglobin: 13.6 g/dL (ref 13.0–17.0)
MCH: 29.9 pg (ref 26.0–34.0)
MCHC: 32.8 g/dL (ref 30.0–36.0)
MCV: 91.2 fL (ref 80.0–100.0)
Platelets: 330 10*3/uL (ref 150–400)
RBC: 4.55 MIL/uL (ref 4.22–5.81)
RDW: 13.2 % (ref 11.5–15.5)
WBC: 6.5 10*3/uL (ref 4.0–10.5)
nRBC: 0 % (ref 0.0–0.2)

## 2024-02-17 LAB — SEDIMENTATION RATE: Sed Rate: 40 mm/h — ABNORMAL HIGH (ref 0–20)

## 2024-02-17 NOTE — Therapy (Unsigned)
 OUTPATIENT OCCUPATIONAL THERAPY NEURO EVALUATION  Patient Name: Muzamil Harker MRN: [id number] DOB:1971-03-31, 53 y.o., male Today's Date: 02/19/2024  PCP: No PCP REFERRING PROVIDER: [name], PA-C  END OF SESSION:   OT End of Session - 02/19/24 0938     Visit Number 1    Number of Visits 24    Date for OT Re-Evaluation 05/11/24    OT Start Time 1100    OT Stop Time 1145    OT Time Calculation (min) 45 min    Activity Tolerance Patient tolerated treatment well    Behavior During Therapy [facility] for tasks assessed/performed         Past Medical History:  Diagnosis Date   Arthritis    Hypertension    Past Surgical History:  Procedure Laterality Date   ABDOMINAL SURGERY     TEE WITHOUT CARDIOVERSION N/A 01/14/2024   Procedure: ECHOCARDIOGRAM, TRANSESOPHAGEAL;  Surgeon: Alluri, Keller BROCKS, MD;  Location: [facility] ORS;  Service: Cardiovascular;  Laterality: N/A;   Patient Active Problem List   Diagnosis Date Noted   Coping style affecting medical condition 01/20/2024   Right pontine cerebrovascular accident [facility]) 01/14/2024   Acute CVA (cerebrovascular accident) (HCC) 01/09/2024   Tobacco use disorder 01/09/2024   Obesity (BMI 30-39.9) 01/09/2024   Hypertension     ONSET DATE: 01/09/24  REFERRING DIAG: I63.9 (ICD-10-CM) - Acute CVA (cerebrovascular accident) (HCC)   THERAPY DIAG:  Muscle weakness (generalized)  Other lack of coordination  Acute CVA (cerebrovascular accident) (HCC)  Rationale for Evaluation and Treatment: Rehabilitation  SUBJECTIVE:  SUBJECTIVE STATEMENT: Pt reports doing well today.  Pt accompanied by: self  PERTINENT HISTORY: Per chart:  Jakyri Brunkhorst is a 53 y.o. right-handed male with history significant for bilateral conjunctival inflammation hypertension as well as tobacco use and class II morbid obesity with BMI 36.26.  Per chart review patient lives alone independent prior to admission.  Presented to [facility]/09/2024 with acute onset  of left-sided weakness.  MRI showed acute infarct in the posterior limb of the right internal capsule, the overlying right frontal white matter and pons.  Multiple remote infarcts and chronic microvascular ischemic disease.  CTA showed no large vessel occlusion.  Admission chemistries unremarkable except potassium 3.1.  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  TEE with small PFO per cardiology services with no plan for closure.  Neurology follow-up placed on aspirin  and Plavix  for CVA prophylaxis x 3 weeks then aspirin  alone.  Lovenox  for DVT prophylaxis but bilateral Doppler studies negative.  Therapy evaluations completed due to patient decreased functional mobility left-sided weakness was admitted for a comprehensive rehab program.   PRECAUTIONS: Fall  WEIGHT BEARING RESTRICTIONS: No  PAIN: No pain, just numbness in the hand  Are you having pain? 0/10  FALLS: Has patient fallen in last 6 months? Yes. 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  Get back my independence.    OBJECTIVE:  Note: Objective measures were completed at Evaluation unless otherwise noted.  HAND DOMINANCE: Right  ADLs: Overall ADLs: Son was staying with pt for about a  week after d/c from inpatient rehab, but has since returned home.  Friend comes by every other day to check in.   Transfers/ambulation related to ADLs: modified indep with rollator Eating: increased difficulty with cutting food  Grooming: increased difficulty with clipping nails, otherwise manages fine with dominant/unaffected hand UB Dressing: increased time with clothing  fasteners LB Dressing: increased time with clothing fasteners  Toileting: increased time to engage L hand into clothing management Bathing: pt sits on shower chair to wash LEs, some SOB with standing in shower Tub Shower transfers: Modified indep Equipment: Shower seat without back  IADLs: Shopping: motorized cart, can push cart for shorter shopping trips.  Light housekeeping: extra time and cautions to avoid falls, sometimes feels SOB with laundry  Meal Prep: friend helps with stove top cooking, pt can do light hot and cold meal prep  Community mobility: rollator, friend drove pt  Medication management: indep  Landscape architect: indep  Handwriting: NT; L non-dominant hand affected   MOBILITY STATUS: Hx of falls, rollator or funiture/wall walking in the home, rollator for community  POSTURE COMMENTS:  L sided hemiparesis   ACTIVITY TOLERANCE: Activity tolerance: Pt reports some dyspnea with prolonged standing/mobility  UPPER EXTREMITY ROM:  BUEs WFL  UPPER EXTREMITY MMT:     MMT Right eval Left eval  Shoulder flexion 5 4-  Shoulder abduction 5 4-  Shoulder adduction    Shoulder extension    Shoulder internal rotation    Shoulder external rotation    Middle trapezius    Lower trapezius    Elbow flexion 5 4-  Elbow extension 5 4-  Wrist flexion 5 4-  Wrist extension 5 4-  Wrist ulnar deviation    Wrist radial deviation    Wrist pronation 5 4-  Wrist supination 5 4-  (Blank rows = not tested)  HAND FUNCTION: Grip strength: Right: 111 lbs; Left: 51 lbs, Lateral pinch: Right: 23 lbs, Left: 13 lbs, and 3 point pinch: Right: 26 lbs, Left: 13 lbs  COORDINATION: Finger Nose Finger test: increased time and decreased accuracy on the L  9 Hole Peg test: Right: 25 sec; Left: 1 min 27 sec  SENSATION: Pt reports increased tingling/numbness when hand balls up a little, but when he straightens it out it goes back to normal.   EDEMA: no visible edema   MUSCLE TONE: LUE:  Mild   COGNITION: Overall cognitive status: Within functional limits for tasks assessed pt reportme difficulties with memory   VISION:             Subjective report: Wears glasses all the time.  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  TREATMENT DATE: 02/17/24 Evaluation completed.   PATIENT EDUCATION: Education details: OT role, goals, poc Person educated: Patient Education method: Explanation and Verbal cues Education comprehension: verbalized understanding  HOME EXERCISE PROGRAM: To be initiated in upcoming sessions.  GOALS: Goals reviewed with patient? Yes  SHORT TERM GOALS: Target date: 03/30/24  Pt will be indep to perform HEP for increasing strength and coordination throughout the LUE. Baseline: Eval: Not yet initiated Goal status: INITIAL  LONG TERM GOALS: Target date: 05/11/24  Pt will increase LUE MMT by 1/2 grade or more to increase engagement of LUE into ADL/IADLs. Baseline: Eval: LUE grossly 4-/5 throughout (R 5/5) Goal status: INITIAL  2.  Pt will increase L grip strength by 20 lbs or more to enable pt to grasp and carry heavy ADL supplies in L hand. Baseline: Eval: L 51 lbs (R 111) Goal status: INITIAL  3.  Pt will increase L hand [facility] skills as demonstrated by completion of 9 hole peg test in <1 min to improve manipulation of small ADL supplies. Baseline: Eval: L 1 min 27 sec (R 25 sec) Goal status: INITIAL  4.  Pt will  increase LUE GMC to enable confidence with moving hot pots/pans on/off stove top and in/out of the oven using BUEs. Baseline: Eval: Pt reports the his friend currently assists with heavier meal prep Goal status: INITIAL  ASSESSMENT:  CLINICAL IMPRESSION: Patient is a 53 y.o. male who was seen today for occupational therapy evaluation for functional decline related to R CVA.  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  Pt's goal is to improve LUE strength, coordination, and overall level of function in order to maximize indep and return to work.  Pt will benefit from skilled OT to work towards above noted goals for improving indep with daily tasks, return to work, reduce burden of care on caregivers, and improve QOL.  Pt in agreement with plan.   PERFORMANCE DEFICITS: in functional skills including ADLs, IADLs, coordination, dexterity, sensation, tone, strength, Fine motor control, Gross motor control, mobility, balance, body mechanics, endurance, decreased knowledge of precautions, decreased knowledge of use of DME, vision, and UE functional use, cognitive skills including memory, and psychosocial skills including coping strategies, environmental adaptation, and routines and behaviors.   IMPAIRMENTS: are limiting patient from ADLs, IADLs, work, and leisure.   CO-MORBIDITIES: has co-morbidities such as tobacco use disorder, HTN, arthritis that affects occupational performance. Patient will benefit from skilled OT to address above impairments and improve overall function.  MODIFICATION OR ASSISTANCE TO COMPLETE EVALUATION: No modification of tasks or assist necessary to complete an evaluation.  OT OCCUPATIONAL PROFILE AND HISTORY: Detailed assessment: Review of records and additional review of physical, cognitive, psychosocial history  related to current functional performance.  CLINICAL DECISION MAKING: Moderate - several treatment options, min-mod task modification necessary  REHAB POTENTIAL: Good  EVALUATION COMPLEXITY: Moderate    PLAN:  OT FREQUENCY: 2x/week  OT DURATION: 12 weeks  PLANNED INTERVENTIONS: 97168 OT Re-evaluation, 97535 self care/ADL training, 02889 therapeutic exercise, 97530 therapeutic activity, 97112 neuromuscular re-education, 97140 manual therapy, 97116 gait training, 02989 moist heat, 97010 cryotherapy, 97750 Physical Performance Testing, passive range of motion, balance training, functional mobility training, visual/perceptual remediation/compensation, psychosocial skills training, energy conservation, coping strategies training, patient/family education, and DME and/or AE instructions  RECOMMENDED OTHER SERVICES: SLP eval and treat d/t pt reporting memory  deficits since CVA, drooling, and slurred speech  CONSULTED AND AGREED WITH PLAN OF CARE: Patient  PLAN FOR NEXT SESSION: see above  Inocente Blazing, MS, OTR/L  Inocente MARLA Blazing, OT 02/19/2024, 9:41 AM

## 2024-02-17 NOTE — Therapy (Signed)
 OUTPATIENT PHYSICAL THERAPY NEURO EVALUATION   Patient Name: Mark Mcintosh MRN: [id number] DOB:June 30, 1971, 53 y.o., male Today's Date: 02/17/2024   PCP: None REFERRING PROVIDER: [name], PA-C  END OF SESSION:  PT End of Session - 02/17/24 0934     Visit Number 1    Number of Visits 25    Date for PT Re-Evaluation 05/11/24    PT Start Time 0955    PT Stop Time 1040    PT Time Calculation (min) 45 min    Equipment Utilized During Treatment Gait belt    Activity Tolerance Patient tolerated treatment well    Behavior During Therapy WFL for tasks assessed/performed          Past Medical History:  Diagnosis Date   Arthritis    Hypertension    Past Surgical History:  Procedure Laterality Date   ABDOMINAL SURGERY     TEE WITHOUT CARDIOVERSION N/A 01/14/2024   Procedure: ECHOCARDIOGRAM, TRANSESOPHAGEAL;  Surgeon: Alluri, Keller BROCKS, MD;  Location: [facility] ORS;  Service: Cardiovascular;  Laterality: N/A;   Patient Active Problem List   Diagnosis Date Noted   Coping style affecting medical condition 01/20/2024   Right pontine cerebrovascular accident [facility]) 01/14/2024   Acute CVA (cerebrovascular accident) (HCC) 01/09/2024   Tobacco use disorder 01/09/2024   Obesity (BMI 30-39.9) 01/09/2024   Hypertension     ONSET DATE: 01/09/24  REFERRING DIAG: I63.9 (ICD-10-CM) - Acute CVA (cerebrovascular accident) (HCC)   THERAPY DIAG:  Unsteadiness on feet  Difficulty in walking, not elsewhere classified  Muscle weakness (generalized)  Other abnormalities of gait and mobility  Rationale for Evaluation and Treatment: Rehabilitation  SUBJECTIVE:                                                                                                                                                                                             SUBJECTIVE STATEMENT: Pt reports he is feeling good today. Pt reports he went to the lab for blood work prior to his visit today and received  a prednisone  prescription for swelling in his eyes. Pt reports that he feels like his L arm and leg has lost a lot of strength. Pt states he has been using the 4WW for ambulation, but was not using it prior to the stroke.  Pt accompanied by: self  PERTINENT HISTORY: Pt admitted to [facility] ED on 01/09/24 for progressively worsening L sided-weakness. Per ED provider note:  Patient presents emergency department for left-sided weakness starting yesterday afternoon, continued today.  Seems to be somewhat intermittent.  Patient's lab work today shows a reassuring CBC with a reassuring chemistry.  Patient's  ethanol is negative.  INR is normal.  However patient's examination is concerning given his left upper extremity weakness pronator drift and mild left facial droop.  Will obtain an MRI of the brain to further evaluate.  Patient agreeable to plan of care.   Patient's MRI unfortunately shows multiple acute infarcts concerning for embolic showering.  Will dose aspirin .  Will admit to the hospital service for further workup and treatment.  Symptoms started yesterday (approximately 30 hours ago) patient is well outside of any therapeutic window.  Pt underwent PT in Acute Care and was admitted to [facility] from 5/29-6/13.  PMH includes arthritis, HTN  PAIN:  Are you having pain? No  PRECAUTIONS: None  RED FLAGS: None   WEIGHT BEARING RESTRICTIONS: No  FALLS: Has patient fallen in last 6 months? Yes. Number of falls 1- pt reports that he tried to get up at home after returning from [facility] and he got up too fast and fell. Pt also reports stumbling on steps at home  LIVING ENVIRONMENT: Lives with: lives alone- pt's son was staying with him until this past Friday- son lives in [place], pt has friends that come by every other day Lives in: House/apartment Stairs: Yes: External: 4 steps; can reach both Has following equipment at home: Walker - 4 wheeled and shower chair  PLOF: Independent- pt works with mixing  chemicals and dyes  PATIENT GOALS: Pt wants to get back to normal and be able to return to work  OBJECTIVE:  Note: Objective measures were completed at Evaluation unless otherwise noted.  DIAGNOSTIC FINDINGS: via chart  From 01/09/24 MRI Brain W/O Contrast: IMPRESSION: 1. Acute infarcts in the posterior limb of the right internal capsule, the overlying right frontal white matter, and pons. Given involvement of multiple vascular territories, consider an embolic etiology. 2. Multiple remote infarcts and chronic microvascular ischemic disease, detailed above.  From 01/10/24 CT Angio Head Neck W W/O CM: IMPRESSION: 1. No acute intracranial hemorrhage or mass effect. 2. No large vessel occlusion, hemodynamically significant stenosis, or aneurysm in the head or neck. 3. Remote lacunar infarcts in the genu of the left internal capsule, globus pallidus, and right corona radiata. 4. Acute infarct involving the posterior limb of the right internal capsule is less well appreciated at CT. 5. Moderately advanced periventricular white matter disease for age.  COGNITION: Overall cognitive status: Within functional limits for tasks assessed   SENSATION: WFL  COORDINATION: Heel to shin: Slower on L, WFL on R Finger to nose: Slower on L with overshooting on first 2 reps, WFL on R   POSTURE: rounded shoulders, forward head, and weight shift right   LOWER EXTREMITY MMT:    MMT Right Eval Left Eval  Hip flexion 4- 3+  Hip extension    Hip abduction    Hip adduction    Hip internal rotation    Hip external rotation    Knee flexion 5 4+  Knee extension 5 4+  Ankle dorsiflexion 5 4-  Ankle plantarflexion 5 4-  Ankle inversion    Ankle eversion    (Blank rows = not tested)  BED MOBILITY:  Not tested- pt reports being IND  TRANSFERS: Sit to stand: Modified independence  Assistive device utilized: None     Stand to sit: Modified independence  Assistive device utilized: None      Chair to chair: SBA  Assistive device utilized: None        STAIRS: Findings: Level of Assistance: CGA, Stair Negotiation Technique: Alternating  Pattern  with Bilateral Rails, Number of Stairs: 4, Height of Stairs: 6   , and Comments: Pt with decreased foot clearance bilaterally L>R , heavy UE support when descending GAIT: Findings: Gait Characteristics: step through pattern, decreased arm swing- Left, decreased step length- Left, decreased stance time- Left, decreased ankle dorsiflexion- Left, lateral lean- Left, wide BOS, poor foot clearance- Right, and poor foot clearance- Left, Distance walked: approx 50 feet, Assistive device utilized:None, Level of assistance: CGA, and Comments: Ambulation for and into/out of clinic  FUNCTIONAL TESTS:  5 times sit-to-stand: 17.9 seconds, pt utilizing arms on knees to assist with standing  : 0.56 m/s with 4WW and CGA; 0.73 m/s with no AD and CGA  BERG Balance Scale: 33/56  FGA: assess next session  : assess next session  PATIENT SURVEYS:  Stroke Impact Scale: 45/80                                                                                                                              TREATMENT DATE: 02/17/24   Eval: All activities performed with gait belt donned and CGA  5xSTS: Pt performed 5 time sit<>stand (5xSTS): 17.9 sec (>15 sec indicates increased fall risk) - pt utilized hands on knees to assist with standing and control descent to sitting  10 Meter Walk Test: Patient instructed to walk 10 meters (32.8 ft) as quickly and as safely as possible at their normal speed. Time measured from 2 meter mark to 8 meter mark to accommodate ramp-up and ramp-down. Gait speed of 0.8 m/s minimum for community ambulation status Normal speed with 4WW: 0.56 m/s Normal speed with no AD: 0.73 m/s  Berg Balance Scale: 33/56   Self-Care/Home management: Pt educated on examination findings and thorough demonstration/explanation of new  HEP provided during session (see below for HEP)  PATIENT EDUCATION: Education details: Pt educated on examination findings and thorough demonstration/explanation of new HEP provided during session Person educated: Patient Education method: Programmer, multimedia, Demonstration, and Handouts Education comprehension: verbalized understanding and returned demonstration  HOME EXERCISE PROGRAM: Access Code: [email or web address]  URL: [email or web address]  Date: 02/17/2024  Prepared by: Darryle Patten  Exercises: - Seated March  - 1 x daily - 3 x weekly - 3 sets - 10 reps  - Standing March with Counter Support  - 1 x daily - 3 x weekly - 3 sets - 10 reps  - Mini Squat with Counter Support  - 1 x daily - 3 x weekly - 3 sets - 10 reps  - Heel Toe Raises with Counter Support  - 1 x daily - 3 x weekly - 3 sets - 10 reps   GOALS: Goals reviewed with patient? Yes  SHORT TERM GOALS: Target date: 03/09/2024   Patient will be independent in home exercise program to improve strength/mobility for better functional independence with ADLs. Baseline: HEP reviewed during eval Goal status: INITIAL   LONG TERM GOALS: Target date: 05/11/2024  Patient will increase  their Stroke Impact Scale score by >10 points for improved function and independence Baseline: 45/80 Goal status: INITIAL  2.  Patient (< 59 years old) will complete five times sit to stand test in < 10 seconds indicating an increased LE strength and improved balance. Baseline: 17.9 seconds with UE support Goal status: INITIAL  3.  Patient will increase 10 meter walk test to >0.8 m/s as to improve gait speed for better community ambulation and to reduce fall risk. Baseline: 0.56 m/s with 4WW & 0.73 m/s with no AD  Goal status: INITIAL  4.  Patient will increase Berg Balance score by > 6 points to demonstrate decreased fall risk during functional activities. Baseline: 33/56 Goal status: INITIAL  5.  Patient will increase Functional Gait  Assessment score to >20/30 as to reduce fall risk and improve dynamic gait safety with community ambulation. Baseline: Assess at next session Goal status: INITIAL  6.  Patient will increase six minute walk test distance by >50 ft for progression to community ambulator and improve gait ability Baseline: Assess at next session Goal status: INITIAL  ASSESSMENT:  CLINICAL IMPRESSION: Patient is a 53 y.o. male who was seen today for physical therapy evaluation and treatment for balance deficits, gait abnormalities, and L sided weakness s/p CVA on 01/09/24. Pt is highly motivated to improve his current condition and appeared to make excellent progress since CVA. Pt responded well to functional assessments performed today and was receptive to all education on current functional status and HEP. Pt presented with L LE strength deficits and balance deficits especially when challenged with stairs/narrow BOS/SLS. Pt with 2 instances of lateral LOB toward L side during BERG balance assessment today, but with handrail/SPT support pt was able to recover and prevent full LOB. Pt's listed deficits affect his ability to ambulate and navigate his home without the use of an assistive device and have prevented him from returning to work. Pt would benefit from skilled PT intervention to improve his balance, tolerance for ambulation, and overall functional status to progress toward returning to work and to regain independence for ease of ADLs.   OBJECTIVE IMPAIRMENTS: Abnormal gait, decreased activity tolerance, decreased balance, decreased coordination, decreased endurance, decreased mobility, difficulty walking, decreased ROM, decreased strength, hypomobility, impaired perceived functional ability, impaired flexibility, impaired UE functional use, improper body mechanics, and postural dysfunction.   ACTIVITY LIMITATIONS: carrying, lifting, bending, sitting, standing, squatting, stairs, transfers, bathing, reach over head,  hygiene/grooming, and locomotion level  PARTICIPATION LIMITATIONS: meal prep, cleaning, laundry, driving, shopping, community activity, occupation, and yard work  PERSONAL FACTORS: Age and Fitness are also affecting patient's functional outcome.   REHAB POTENTIAL: Good  CLINICAL DECISION MAKING: Evolving/moderate complexity  EVALUATION COMPLEXITY: Moderate  PLAN:  PT FREQUENCY: 2x/week  PT DURATION: 12 weeks  PLANNED INTERVENTIONS: 97164- PT Re-evaluation, 97750- Physical Performance Testing, 97110-Therapeutic exercises, 97530- Therapeutic activity, [id number]- Neuromuscular re-education, 97535- Self Care, 02859- Manual therapy, [id number]- Gait training, [phone]- Orthotic Initial, [phone]- Orthotic/Prosthetic subsequent, [phone]- Canalith repositioning, [id number]- Splinting, [id number]- Electrical stimulation (manual), [id number]- Ultrasound, [id number]- Traction (mechanical), [phone] (1-2 muscles), [id number] (3+ muscles)- Dry Needling, Patient/Family education, Balance training, Stair training, Taping, Joint mobilization, Spinal mobilization, Vestibular training, Visual/preceptual remediation/compensation, Cognitive remediation, DME instructions, Cryotherapy, and Moist heat  PLAN FOR NEXT SESSION: Assess FGA and , assess hip MMTs, begin dynamic balance training and LE strengthening   Janell Axe, SPT   Darryle JONELLE Patten, PT 02/17/2024, 3:34 PM  This entire session was performed under direct supervision and direction of a  licensed Estate agent . I have personally read, edited and approve of the note as written. Darryle Patten PT, DPT

## 2024-02-18 LAB — ANGIOTENSIN CONVERTING ENZYME: Angiotensin-Converting Enzyme: 38 U/L (ref 14–82)

## 2024-02-18 LAB — ANA: Anti Nuclear Antibody (ANA): NEGATIVE

## 2024-02-18 LAB — RPR: RPR Ser Ql: NONREACTIVE

## 2024-02-19 LAB — RHEUMATOID FACTOR: Rheumatoid fact SerPl-aCnc: <10 [IU]/mL (ref <14.0)

## 2024-02-21 LAB — QUANTIFERON-TB GOLD PLUS: QuantiFERON-TB Gold Plus: NEGATIVE

## 2024-02-21 LAB — QUANTIFERON-TB GOLD PLUS (RQFGPL)
QuantiFERON Mitogen Value: >10 [IU]/mL
QuantiFERON Nil Value: 0.01 [IU]/mL
QuantiFERON TB1 Ag Value: 0.01 [IU]/mL
QuantiFERON TB2 Ag Value: 0.01 [IU]/mL

## 2024-02-25 ENCOUNTER — Ambulatory Visit

## 2024-02-25 DIAGNOSIS — M6281 Muscle weakness (generalized): Secondary | ICD-10-CM

## 2024-02-25 DIAGNOSIS — R262 Difficulty in walking, not elsewhere classified: Secondary | ICD-10-CM

## 2024-02-25 DIAGNOSIS — R2681 Unsteadiness on feet: Secondary | ICD-10-CM

## 2024-02-25 DIAGNOSIS — I639 Cerebral infarction, unspecified: Secondary | ICD-10-CM

## 2024-02-25 DIAGNOSIS — R278 Other lack of coordination: Secondary | ICD-10-CM

## 2024-02-25 DIAGNOSIS — R2689 Other abnormalities of gait and mobility: Secondary | ICD-10-CM

## 2024-02-25 LAB — HLA-B27 ANTIGEN: HLA-B27: NEGATIVE

## 2024-02-25 NOTE — Therapy (Signed)
 OUTPATIENT PHYSICAL THERAPY NEURO TREATMENT   Patient Name: Mark Mcintosh MRN: [id number] DOB:06-14-1971, 53 y.o., male Today's Date: 02/25/2024   PCP: None REFERRING PROVIDER: [name], PA-C  END OF SESSION:  PT End of Session - 02/25/24 1306     Visit Number 2    Number of Visits 25    Date for PT Re-Evaluation 05/11/24    PT Start Time 1315    PT Stop Time 1355    PT Time Calculation (min) 40 min    Equipment Utilized During Treatment Gait belt    Activity Tolerance Patient tolerated treatment well    Behavior During Therapy WFL for tasks assessed/performed           Past Medical History:  Diagnosis Date   Arthritis    Hypertension    Past Surgical History:  Procedure Laterality Date   ABDOMINAL SURGERY     TEE WITHOUT CARDIOVERSION N/A 01/14/2024   Procedure: ECHOCARDIOGRAM, TRANSESOPHAGEAL;  Surgeon: Alluri, Keller BROCKS, MD;  Location: [facility] ORS;  Service: Cardiovascular;  Laterality: N/A;   Patient Active Problem List   Diagnosis Date Noted   Coping style affecting medical condition 01/20/2024   Right pontine cerebrovascular accident [facility]) 01/14/2024   Acute CVA (cerebrovascular accident) (HCC) 01/09/2024   Tobacco use disorder 01/09/2024   Obesity (BMI 30-39.9) 01/09/2024   Hypertension     ONSET DATE: 01/09/24  REFERRING DIAG: I63.9 (ICD-10-CM) - Acute CVA (cerebrovascular accident) (HCC)   THERAPY DIAG:  Muscle weakness (generalized)  Other lack of coordination  Unsteadiness on feet  Difficulty in walking, not elsewhere classified  Other abnormalities of gait and mobility  Rationale for Evaluation and Treatment: Rehabilitation  SUBJECTIVE:                                                                                                                                                                                             SUBJECTIVE STATEMENT: Pt reports that he has been feeling good. Pt states that he has been doing his exercises  at home and they have been going well with no issues.    Pt accompanied by: self  PERTINENT HISTORY: Pt admitted to [facility] ED on 01/09/24 for progressively worsening L sided-weakness. Per ED provider note:  Patient presents emergency department for left-sided weakness starting yesterday afternoon, continued today.  Seems to be somewhat intermittent.  Patient's lab work today shows a reassuring CBC with a reassuring chemistry.  Patient's ethanol is negative.  INR is normal.  However patient's examination is concerning given his left upper extremity weakness pronator drift and mild left facial droop.  Will obtain an MRI of  the brain to further evaluate.  Patient agreeable to plan of care.   Patient's MRI unfortunately shows multiple acute infarcts concerning for embolic showering.  Will dose aspirin .  Will admit to the hospital service for further workup and treatment.  Symptoms started yesterday (approximately 30 hours ago) patient is well outside of any therapeutic window.  Pt underwent PT in Acute Care and was admitted to [facility] from 5/29-6/13.  PMH includes arthritis, HTN  PAIN:  Are you having pain? No  PRECAUTIONS: None  RED FLAGS: None   WEIGHT BEARING RESTRICTIONS: No  FALLS: Has patient fallen in last 6 months? Yes. Number of falls 1- pt reports that he tried to get up at home after returning from [facility] and he got up too fast and fell. 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  DIAGNOSTIC FINDINGS: via chart  From 01/09/24 MRI Brain W/O Contrast: IMPRESSION: 1. Acute infarcts in the posterior limb of the right internal capsule, the overlying right frontal white matter, and pons. Given involvement of multiple vascular territories, consider an embolic etiology. 2. Multiple remote infarcts and chronic microvascular ischemic disease, detailed above.  From 01/10/24 CT Angio Head Neck W W/O CM: IMPRESSION: 1. No acute intracranial hemorrhage or mass effect. 2. No large vessel occlusion, hemodynamically significant stenosis, or aneurysm in the head or neck. 3. Remote lacunar infarcts in the genu of the left internal capsule, globus pallidus, and right corona radiata. 4. Acute infarct involving the posterior limb of the right internal capsule is less well appreciated at CT. 5. Moderately advanced periventricular white matter disease for age.  COGNITION: Overall cognitive status: Within functional limits for tasks assessed   SENSATION: WFL  COORDINATION: Heel to shin: Slower on L, WFL on R Finger to nose: Slower on L with overshooting on first 2 reps, WFL on R   POSTURE: rounded shoulders, forward head, and weight shift right   LOWER EXTREMITY MMT:    MMT Right Eval Left Eval  Hip flexion 4- 3+  Hip extension    Hip abduction 5 5  Hip adduction 4+ 4+  Hip internal rotation    Hip external rotation     Knee flexion 5 4+  Knee extension 5 4+  Ankle dorsiflexion 5 4-  Ankle plantarflexion 5 4-  Ankle inversion    Ankle eversion    (Blank rows = not tested)  BED MOBILITY:  Not tested- pt reports being IND  TRANSFERS: Sit to stand: Modified independence  Assistive device utilized: None     Stand to sit: Modified independence  Assistive device utilized: None     Chair to chair: SBA  Assistive device utilized: None        STAIRS: Findings: Level of Assistance: CGA, Stair Negotiation Technique: Alternating Pattern   with Bilateral Rails, Number of Stairs: 4, Height of Stairs: 6   , and Comments: Pt with decreased foot clearance bilaterally L>R , heavy UE support when descending  GAIT: Findings: Gait Characteristics: step through pattern, decreased arm swing- Left, decreased step length- Left, decreased stance time- Left, decreased ankle dorsiflexion- Left, lateral lean- Left, wide BOS, poor foot clearance- Right, and poor foot clearance- Left, Distance walked: approx 50 feet, Assistive device utilized:None, Level of assistance: CGA, and Comments: Ambulation for and into/out of clinic  FUNCTIONAL TESTS:  5 times sit-to-stand: 17.9 seconds, pt utilizing arms on knees to assist with standing  : 0.56 m/s with 4WW and CGA; 0.73 m/s with no AD and CGA  BERG Balance Scale: 33/56  FGA: 18/30 on 02/25/24  : 795 ft with 4WW on 02/25/24  PATIENT SURVEYS:  Stroke Impact Scale: 45/80                                                                                                                              TREATMENT DATE: 02/25/24   Physical Performance Measures: 6 Min Walk Test:  Instructed patient to ambulate as quickly and as safely as possible for 6 minutes using LRAD. Patient was allowed to take standing rest breaks without stopping the test, but if the patient required a sitting rest break the clock would be stopped and the test would be over.  Results: 795  feet (242.3 meters) using a 4WW with CGA. Results indicate that the patient has reduced endurance with ambulation compared to age matched norms.  Age Matched Norms: 11-69 yo M: 71 F: 19, 8-79 yo M: 77 F: 471, 72-89 yo M: 417 F: 392 MDC: 58.21 meters (190.98 feet) or 50 meters (ANPTA Core Set of Outcome Measures for Adults with Neurologic Conditions, 2018)   Functional Gait Assessment:   [facility] PT Assessment - 02/25/24 0001       Functional Gait  Assessment   Gait assessed  Yes    Gait Level Surface Walks 20 ft in less than 7 sec but  greater than 5.5 sec, uses assistive device, slower speed, mild gait deviations, or deviates 6-10 in outside of the 12 in walkway width.    Change in Gait Speed Makes only minor adjustments to walking speed, or accomplishes a change in speed with significant gait deviations, deviates 10-15 in outside the 12 in walkway width, or changes speed but loses balance but is able to recover and continue walking.    Gait with Horizontal Head Turns Performs head turns smoothly with slight change in gait velocity (eg, minor disruption to smooth gait path), deviates 6-10 in outside 12 in walkway width, or uses an assistive device.    Gait with Vertical Head Turns Performs task with slight change in gait velocity (eg, minor disruption to smooth gait path), deviates 6 - 10 in outside 12 in walkway width or uses assistive device    Gait and Pivot Turn Pivot turns safely within 3 sec and stops quickly with no loss of balance.    Step Over Obstacle Is able to step over one shoe box (4.5 in total  height) but must slow down and adjust steps to clear box safely. May require verbal cueing.    Gait with Narrow Base of Support Ambulates 4-7 steps.    Gait with Eyes Closed Walks 20 ft, uses assistive device, slower speed, mild gait deviations, deviates 6-10 in outside 12 in walkway width. Ambulates 20 ft in less than 9 sec but greater than 7 sec.    Ambulating Backwards Walks 20 ft, uses assistive device, slower speed, mild gait deviations, deviates 6-10 in outside 12 in walkway width.    Steps Alternating feet, must use rail.    Total Score 18        18/30- no AD used  Neuro Re-ed: Balance Circuit on Airex: - static stance x30 seconds - eyes closed x30 seconds - horizontal head turns x 30 seconds - vertical head turns x 30 seconds - one foot on Airex, one on 6 step 2x30 seconds each LE  STS with green pad under R foot to force weight shift on L LE 2x10  Ther-ex: Side step on/off 6 step x15 each direction- cues  for eccentric lowering when stepping to L side  Dynadisc ankle DF/PF x15 Dynadisc ankle INV/EV x15   PATIENT EDUCATION: Education details: Pt educated on examination findings and thorough demonstration/explanation of new HEP provided during session Person educated: Patient Education method: Explanation, Demonstration, and Handouts Education comprehension: verbalized understanding and returned demonstration  HOME EXERCISE PROGRAM: Access Code: [email or web address]  URL: [email or web address]  Date: 02/17/2024  Prepared by: Darryle Patten  Exercises: - Seated March  - 1 x daily - 3 x weekly - 3 sets - 10 reps  - Standing March with Counter Support  - 1 x daily - 3 x weekly - 3 sets - 10 reps  - Mini Squat with Counter Support  - 1 x daily - 3 x weekly - 3 sets - 10 reps  - Heel Toe Raises with Counter Support  - 1 x daily - 3 x weekly - 3 sets - 10 reps   GOALS: Goals reviewed with patient? Yes  SHORT TERM GOALS: Target date: 03/09/2024   Patient will be independent in home exercise program to improve strength/mobility for better functional independence with ADLs. Baseline: HEP reviewed during eval Goal status: INITIAL   LONG TERM GOALS: Target date: 05/11/2024  Patient will increase their Stroke Impact Scale score by >10 points for improved function and independence Baseline: 45/80 Goal status: INITIAL  2.  Patient (< 41 years old) will complete five times sit to stand test in < 10 seconds indicating an increased LE strength and improved balance. Baseline: 17.9 seconds with UE support Goal status: INITIAL  3.  Patient will increase 10 meter walk test to >0.8 m/s as to improve gait speed for better community ambulation and to reduce fall risk. Baseline: 0.56 m/s with 4WW & 0.73 m/s with no AD  Goal status: INITIAL  4.  Patient will increase Berg Balance score by > 6 points to demonstrate decreased fall risk during functional activities. Baseline: 33/56 Goal status:  INITIAL  5.  Patient will increase Functional Gait Assessment score to >20/30 as to reduce fall risk and improve dynamic gait safety with community ambulation. Baseline:18/30 on 02/25/24 Goal status: INITIAL  6.  Patient will increase six minute walk test distance by >50 ft for progression to community ambulator and improve gait ability Baseline: 751ft with 4WW on 02/25/24 Goal status: INITIAL  ASSESSMENT:  CLINICAL IMPRESSION: Pt responded well to physical performance measures  as well as dynamic balance/LE strengthening initiated today and remained engaged and motivated throughout session. Pt's results are indicative of an endurance deficit and FGA score revealed particular difficulty with changes in gait speed, obstacle negotiation, and walking with narrowed base of support. Future sessions to focus on progressing pt's ambulation endurance, dynamic balance tolerance, and incorporating tasks that promote increased weight shift onto L LE to improve weight bearing tolerance. Pt would benefit from skilled PT intervention to improve his balance, tolerance for ambulation, and overall functional status to progress toward returning to work and to regain independence for ease of ADLs.   OBJECTIVE IMPAIRMENTS: Abnormal gait, decreased activity tolerance, decreased balance, decreased coordination, decreased endurance, decreased mobility, difficulty walking, decreased ROM, decreased strength, hypomobility, impaired perceived functional ability, impaired flexibility, impaired UE functional use, improper body mechanics, and postural dysfunction.   ACTIVITY LIMITATIONS: carrying, lifting, bending, sitting, standing, squatting, stairs, transfers, bathing, reach over head, hygiene/grooming, and locomotion level  PARTICIPATION LIMITATIONS: meal prep, cleaning, laundry, driving, shopping, community activity, occupation, and yard work  PERSONAL FACTORS: Age and Fitness are also affecting patient's functional  outcome.   REHAB POTENTIAL: Good  CLINICAL DECISION MAKING: Evolving/moderate complexity  EVALUATION COMPLEXITY: Moderate  PLAN:  PT FREQUENCY: 2x/week  PT DURATION: 12 weeks  PLANNED INTERVENTIONS: 97164- PT Re-evaluation, 97750- Physical Performance Testing, 97110-Therapeutic exercises, 97530- Therapeutic activity, [id number]- Neuromuscular re-education, 97535- Self Care, [id number]- Manual therapy, [id number]- Gait training, [id number]- Orthotic Initial, [id number]- Orthotic/Prosthetic subsequent, [phone]- Canalith repositioning, [id number]- Splinting, [id number]- Electrical stimulation (manual), [id number]- Ultrasound, [id number]- Traction (mechanical), [phone] (1-2 muscles), [id number] (3+ muscles)- Dry Needling, Patient/Family education, Balance training, Stair training, Taping, Joint mobilization, Spinal mobilization, Vestibular training, Visual/preceptual remediation/compensation, Cognitive remediation, DME instructions, Cryotherapy, and Moist heat  PLAN FOR NEXT SESSION: Continue LE strengthening, progress dynamic balance, weight shifting tasks to promote WB on L LE, cardiovascular endurance training (Nu-step or Octane)   Janell Axe, SPT  This entire session was performed under direct supervision and direction of a licensed Estate agent . I have personally read, edited and approve of the note as written.   Marina  Leopoldo KLEIN, DPT Physical Therapist - [facility]  Outpatient Physical Therapy- Main Campus [phone]    02/25/2024, 3:09 PM

## 2024-02-27 ENCOUNTER — Encounter: Payer: Self-pay | Admitting: Family Medicine

## 2024-02-27 ENCOUNTER — Ambulatory Visit: Admitting: Family Medicine

## 2024-02-27 VITALS — BP 127/82 | HR 38 | Ht 71.0 in | Wt 221.2 lb

## 2024-02-27 DIAGNOSIS — Z7689 Persons encountering health services in other specified circumstances: Secondary | ICD-10-CM

## 2024-02-27 DIAGNOSIS — R7303 Prediabetes: Secondary | ICD-10-CM

## 2024-02-27 DIAGNOSIS — R7981 Abnormal blood-gas level: Secondary | ICD-10-CM

## 2024-02-27 DIAGNOSIS — N6313 Unspecified lump in the right breast, lower outer quadrant: Secondary | ICD-10-CM

## 2024-02-27 DIAGNOSIS — I69392 Facial weakness following cerebral infarction: Secondary | ICD-10-CM | POA: Diagnosis not present

## 2024-02-27 DIAGNOSIS — I69322 Dysarthria following cerebral infarction: Secondary | ICD-10-CM | POA: Diagnosis not present

## 2024-02-27 DIAGNOSIS — R001 Bradycardia, unspecified: Secondary | ICD-10-CM | POA: Diagnosis not present

## 2024-02-27 DIAGNOSIS — I6930 Unspecified sequelae of cerebral infarction: Secondary | ICD-10-CM

## 2024-02-27 DIAGNOSIS — R531 Weakness: Secondary | ICD-10-CM

## 2024-02-27 DIAGNOSIS — E559 Vitamin D deficiency, unspecified: Secondary | ICD-10-CM | POA: Insufficient documentation

## 2024-02-27 DIAGNOSIS — R269 Unspecified abnormalities of gait and mobility: Secondary | ICD-10-CM

## 2024-02-27 DIAGNOSIS — E669 Obesity, unspecified: Secondary | ICD-10-CM

## 2024-02-27 DIAGNOSIS — I10 Essential (primary) hypertension: Secondary | ICD-10-CM

## 2024-02-27 DIAGNOSIS — F17200 Nicotine dependence, unspecified, uncomplicated: Secondary | ICD-10-CM

## 2024-02-27 DIAGNOSIS — F321 Major depressive disorder, single episode, moderate: Secondary | ICD-10-CM

## 2024-02-27 MED ORDER — BLOOD PRESSURE CUFF MISC: 1.0000 | Freq: Every day | 0 refills | Status: DC

## 2024-02-27 MED ORDER — ASPIRIN 81 MG PO TBEC: 81.0000 mg | DELAYED_RELEASE_TABLET | Freq: Every day | ORAL | 3 refills | Status: AC

## 2024-02-27 MED ORDER — ATORVASTATIN CALCIUM 40 MG PO TABS: 40.0000 mg | ORAL_TABLET | Freq: Every day | ORAL | 3 refills | Status: AC

## 2024-02-27 MED ORDER — AMLODIPINE BESYLATE 2.5 MG PO TABS: 2.5000 mg | ORAL_TABLET | Freq: Every day | ORAL | 3 refills | Status: AC

## 2024-02-27 MED ORDER — NICOTINE 21 MG/24HR TD PT24
21.0000 mg | MEDICATED_PATCH | Freq: Every day | TRANSDERMAL | 1 refills | Status: DC
Start: 2024-02-27 — End: 2024-06-11

## 2024-02-27 MED ORDER — VITAMIN D3 25 MCG PO TABS: 1000.0000 [IU] | ORAL_TABLET | Freq: Every day | ORAL | 3 refills | Status: AC

## 2024-02-27 MED ORDER — IRBESARTAN 75 MG PO TABS: 75.0000 mg | ORAL_TABLET | Freq: Every day | ORAL | 3 refills | Status: AC

## 2024-02-27 NOTE — Assessment & Plan Note (Addendum)
 Pea size lump on 9 oclock R breast, superficial, mobile, nontender, bordered defined Lipoma vs cyst vs abnormal cell growth No breast cancer hx as far as pt is aware Order US  for breast tissue lump

## 2024-02-27 NOTE — Assessment & Plan Note (Signed)
 Chronic, controlled Continue Amlodipine  2.5mg  daily Continue Irbesartan  75mg  daily BP cuff ordered for pt GOAL<130/80 Limit sodium intake, processed food Encourage exercise activity as tolerated

## 2024-02-27 NOTE — Therapy (Signed)
 OUTPATIENT OCCUPATIONAL THERAPY NEURO TREATMENT NOTE  Patient Name: Mark Mcintosh MRN: [id number] DOB:Sep 04, 1970, 53 y.o., male Today's Date: 02/27/2024  PCP: No PCP REFERRING PROVIDER: [name], PA-C  END OF SESSION:   OT End of Session - 02/27/24 0920     Visit Number 2    Number of Visits 24    Date for OT Re-Evaluation 05/11/24    OT Start Time 1400    OT Stop Time 1445    OT Time Calculation (min) 45 min    Activity Tolerance Patient tolerated treatment well    Behavior During Therapy [facility] for tasks assessed/performed         Past Medical History:  Diagnosis Date   Arthritis    Hypertension    Stroke (HCC) 01/09/2024   Stated he had 2 strokes.   Past Surgical History:  Procedure Laterality Date   TEE WITHOUT CARDIOVERSION N/A 01/14/2024   Procedure: ECHOCARDIOGRAM, TRANSESOPHAGEAL;  Surgeon: Alluri, Keller BROCKS, MD;  Location: [facility] ORS;  Service: Cardiovascular;  Laterality: N/A;   Patient Active Problem List   Diagnosis Date Noted   Coping style affecting medical condition 01/20/2024   Right pontine cerebrovascular accident [facility]) 01/14/2024   Acute CVA (cerebrovascular accident) (HCC) 01/09/2024   Tobacco use disorder 01/09/2024   Obesity (BMI 30-39.9) 01/09/2024   Hypertension    ONSET DATE: 01/09/24  REFERRING DIAG: I63.9 (ICD-10-CM) - Acute CVA (cerebrovascular accident) (HCC)   THERAPY DIAG:  Muscle weakness (generalized)  Other lack of coordination  Acute CVA (cerebrovascular accident) (HCC)  Rationale for Evaluation and Treatment: Rehabilitation  SUBJECTIVE:  SUBJECTIVE STATEMENT: Pt reports having a nice holiday and was able to spend some more time with his son from out of town. Pt accompanied by: self  PERTINENT HISTORY: Per chart:  Mark Mcintosh is a 53 y.o. right-handed male with history significant for bilateral conjunctival inflammation hypertension as well as tobacco use and class II morbid obesity with BMI 36.26.  Per  chart review patient lives alone independent prior to admission.  Presented to [facility] 01/09/2024 with acute onset of left-sided weakness.  MRI showed acute infarct in the posterior limb of the right internal capsule, the overlying right frontal white matter and pons.  Multiple remote infarcts and chronic microvascular ischemic disease.  CTA showed no large vessel occlusion.  Admission chemistries unremarkable except potassium 3.1.  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  TEE with small PFO per cardiology services with no plan for closure.  Neurology follow-up placed on aspirin  and Plavix  for CVA prophylaxis x 3 weeks then aspirin  alone.  Lovenox  for DVT prophylaxis but bilateral Doppler studies negative.  Therapy evaluations completed due to patient decreased functional mobility left-sided weakness was admitted for a comprehensive rehab program.   PRECAUTIONS: Fall  WEIGHT BEARING RESTRICTIONS: No  PAIN: No pain, just numbness in the hand  Are you having pain? 0/10  FALLS: Has patient fallen in last 6 months? Yes. 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  Get back my independence.    OBJECTIVE:  Note: Objective measures were completed  at Evaluation unless otherwise noted.  HAND DOMINANCE: Right  ADLs: Overall ADLs: Son was staying with pt for about a week after d/c from inpatient rehab, but has since returned home.  Friend comes by every other day to check in.   Transfers/ambulation related to ADLs: modified indep with rollator Eating: increased difficulty with cutting food  Grooming: increased difficulty with  clipping nails, otherwise manages fine with dominant/unaffected hand UB Dressing: increased time with clothing fasteners LB Dressing: increased time with clothing fasteners  Toileting: increased time to engage L hand into clothing management Bathing: pt sits on shower chair to wash LEs, some SOB with standing in shower Tub Shower transfers: Modified indep Equipment: Shower seat without back  IADLs: Shopping: motorized cart, can push cart for shorter shopping trips.  Light housekeeping: extra time and cautions to avoid falls, sometimes feels SOB with laundry  Meal Prep: friend helps with stove top cooking, pt can do light hot and cold meal prep  Community mobility: rollator, friend drove pt  Medication management: indep  Landscape architect: indep  Handwriting: NT; L non-dominant hand affected   MOBILITY STATUS: Hx of falls, rollator or funiture/wall walking in the home, rollator for community  POSTURE COMMENTS:  L sided hemiparesis   ACTIVITY TOLERANCE: Activity tolerance: Pt reports some dyspnea with prolonged standing/mobility  UPPER EXTREMITY ROM:  BUEs WFL  UPPER EXTREMITY MMT:     MMT Right eval Left eval  Shoulder flexion 5 4-  Shoulder abduction 5 4-  Shoulder adduction    Shoulder extension    Shoulder internal rotation    Shoulder external rotation    Middle trapezius    Lower trapezius    Elbow flexion 5 4-  Elbow extension 5 4-  Wrist flexion 5 4-  Wrist extension 5 4-  Wrist ulnar deviation    Wrist radial deviation    Wrist pronation 5 4-  Wrist supination 5 4-  (Blank rows = not tested)  HAND FUNCTION: Grip strength: Right: 111 lbs; Left: 51 lbs, Lateral pinch: Right: 23 lbs, Left: 13 lbs, and 3 point pinch: Right: 26 lbs, Left: 13 lbs  COORDINATION: Finger Nose Finger test: increased time and decreased accuracy on the L  9 Hole Peg test: Right: 25 sec; Left: 1 min 27 sec  SENSATION: Pt reports increased tingling/numbness when hand balls up a  little, but when he straightens it out it goes back to normal.   EDEMA: no visible edema   MUSCLE TONE: LUE: Mild   COGNITION: Overall cognitive status: Within functional limits for tasks assessed pt reportme difficulties with memory   VISION:             Subjective report: Wears glasses all the time.  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  TREATMENT DATE: 02/25/24 Therapeutic Exercise: Issued green theraputty and instructed pt in strengthening and coordination exercises for L hand, including gross grasping, lateral/2 point/3 point pinching, digit abd/add, and digging coins out of putty.  Able to return demo with intermittent min vc for technique to improve quality of movement.  Encouraged completion 5-10 min, 1-2x per day.    Therapeutic Activity: -[facility] HEP reviewed with trial of various activities, including flipping cards, coin manipulation activities, screwing unscrewing nuts/bolts.  Min vc for using the R unaffected side as a stabilizer to maximize FM challenge with the L hand.  -OT highlighted recommended activities to complete at home with min vc for grading activities up/down.  Neuro re-ed: -Participation in LUE FMC/GMC and bilat coordination skills working to thread washers of various sizes onto a vertical/horiz/diagonal dowel.  Vc to utilize R hand as a stabilizer for dowel. -Increased challenge with pt working to store 2 washers in hand  and thread them 1 by 1 overtop dowel.    PATIENT EDUCATION: Education details: HEP Person educated: Patient Education method: Explanation and Verbal cues, visual handout Education comprehension: verbalized understanding  HOME EXERCISE PROGRAM: Landy fake, [facility] handout  GOALS: Goals reviewed with patient? Yes  SHORT TERM GOALS: Target date: 03/30/24  Pt will be indep to perform HEP for increasing strength and coordination throughout the LUE. Baseline: Eval: Not yet initiated Goal status: INITIAL  LONG TERM GOALS: Target date: 05/11/24  Pt will increase LUE MMT by 1/2 grade or more to increase engagement of LUE into ADL/IADLs. Baseline: Eval: LUE grossly 4-/5 throughout (R 5/5) Goal status: INITIAL  2.  Pt will increase L grip strength by 20 lbs or more to enable pt to grasp and carry heavy ADL supplies in L hand. Baseline: Eval: L 51 lbs (R 111) Goal status: INITIAL  3.  Pt will increase L hand [facility] skills as demonstrated by completion of 9 hole peg test in <1 min to improve manipulation of small ADL supplies. Baseline: Eval: L 1 min 27 sec (R 25 sec) Goal status: INITIAL  4.  Pt will increase LUE GMC to enable confidence with moving hot pots/pans on/off stove top and in/out of the oven using BUEs. Baseline: Eval: Pt reports the his friend currently assists with heavier meal prep Goal status: INITIAL  ASSESSMENT:  CLINICAL IMPRESSION: Pt with good tolerance to exercises and activities this date.  Pt showed good return demo with min vc with visual handouts for putty and FMC exercises.  Pt demonstrated frequent dropping with washers, but improved after downgrading activity from use of vertical to a horizontal or diagonal dowel.  Pt will benefit from continued skilled OT to work towards above noted goals for improving indep with daily tasks, return to work, reduce burden of care on caregivers, and improve QOL.    PERFORMANCE DEFICITS: in functional skills including ADLs,  IADLs, coordination, dexterity, sensation, tone, strength, Fine motor control, Gross motor control, mobility, balance, body mechanics, endurance, decreased knowledge of precautions, decreased knowledge of use of DME, vision, and UE functional use, cognitive skills including memory, and psychosocial skills including coping strategies, environmental adaptation, and routines and behaviors.   IMPAIRMENTS: are limiting patient from ADLs, IADLs, work, and leisure.   CO-MORBIDITIES: has co-morbidities such as tobacco use disorder, HTN, arthritis that affects occupational performance. Patient will benefit from skilled OT to address above impairments and improve overall function.  MODIFICATION OR ASSISTANCE TO COMPLETE EVALUATION: No modification of tasks or assist necessary to complete an evaluation.  OT OCCUPATIONAL PROFILE AND HISTORY: Detailed assessment: Review of records and additional review of physical, cognitive, psychosocial history related to current functional performance.  CLINICAL DECISION MAKING: Moderate - several treatment options, min-mod task modification necessary  REHAB POTENTIAL: Good  EVALUATION COMPLEXITY: Moderate    PLAN:  OT FREQUENCY: 2x/week  OT DURATION: 12 weeks  PLANNED INTERVENTIONS: 97168 OT Re-evaluation, 97535 self care/ADL training, 02889 therapeutic exercise, 97530 therapeutic activity, 97112 neuromuscular re-education, 97140 manual therapy, 97116 gait training, 02989 moist heat, 97010 cryotherapy, 97750 Physical Performance Testing, passive range of motion, balance training, functional mobility training, visual/perceptual remediation/compensation, psychosocial skills training, energy conservation, coping strategies training, patient/family education, and DME and/or AE instructions  RECOMMENDED OTHER SERVICES: SLP eval and treat d/t pt reporting memory deficits since CVA, drooling, and slurred speech  CONSULTED AND AGREED WITH PLAN OF CARE: Patient  PLAN FOR  NEXT SESSION: see above  Inocente Blazing, MS, OTR/L  Inocente MARLA Blazing, OT 02/27/2024, 9:22 AM

## 2024-02-27 NOTE — Assessment & Plan Note (Addendum)
 Rooming pulse ox showed HR ranging from 38 - 70 - may be device related Heart sounds, were irregular in rate EKG performed = NSR with PACs, HR 60s - reassuring Asymptomatic Bp normal Cards appt on 7/16 - appreciate any further eval

## 2024-02-27 NOTE — Assessment & Plan Note (Signed)
 Residual from CVA Starting speech therapy next week

## 2024-02-27 NOTE — Assessment & Plan Note (Signed)
 Continue Vitamin D supplement.

## 2024-02-27 NOTE — Progress Notes (Addendum)
 New Patient Office Visit  Introduced to nurse practitioner role and practice setting.  All questions answered.  Discussed provider/patient relationship and expectations.   Subjective    Patient ID: Mark Mcintosh, male    DOB: 08-28-1970  Age: 53 y.o. MRN: [id number]  CC:  Chief Complaint  Patient presents with   New Patient (Initial Visit)    Patient had 2 strokes at the end of May it affected his left side which has him walking with a walker.  Reports that he also has a knot on right side, has been there for about 4 months.  Patient is going through rehab or PT to strengthen left side and also speech therapy.  Patient needs refills on some of the medications that he was prescribed when being released.   HPI  Discussed the use of AI scribe software for clinical note transcription with the patient, who gave verbal consent to proceed.  History of Present Illness  Pt here to establish care with primary. Recent CVA, leaving him with L sided weakness, dysarthric, facial droop, and needing rolling walker. He needs his medications refilled, and he has concerns today for R chest lump that has been there for about four months.   He is using tobacco, but down to 5-6 cigarettes per day.  He is seeing speech and PT for stroke rehab  He has a cardiology appt next week for his post stroke work up.  He would like to monitor blood pressure at home with device.  Outpatient Encounter Medications as of 02/27/2024  Medication Sig   albuterol  (VENTOLIN  HFA) 108 (90 Base) MCG/ACT inhaler Inhale 2 puffs into the lungs every 6 (six) hours as needed for wheezing or shortness of breath.   Blood Pressure Monitoring (BLOOD PRESSURE CUFF) MISC 1 Device by Does not apply route daily.   nicotine  (NICODERM CQ  - DOSED IN MG/24 HOURS) 21 mg/24hr patch Place 1 patch (21 mg total) onto the skin daily.   prednisoLONE  acetate (PRED FORTE ) 1 % ophthalmic suspension Place 1 drop into both eyes 4 (four) times daily.    [DISCONTINUED] amLODipine  (NORVASC ) 2.5 MG tablet Take 1 tablet (2.5 mg total) by mouth daily.   [DISCONTINUED] aspirin  EC 81 MG tablet Take 1 tablet (81 mg total) by mouth daily. Swallow whole.   [DISCONTINUED] atorvastatin  (LIPITOR) 40 MG tablet Take 1 tablet (40 mg total) by mouth daily.   [DISCONTINUED] irbesartan  (AVAPRO ) 75 MG tablet Take 1 tablet (75 mg total) by mouth daily.   [DISCONTINUED] vitamin D3 (CHOLECALCIFEROL ) 25 MCG tablet Take 1 tablet (1,000 Units total) by mouth daily.   amLODipine  (NORVASC ) 2.5 MG tablet Take 1 tablet (2.5 mg total) by mouth daily.   aspirin  EC 81 MG tablet Take 1 tablet (81 mg total) by mouth daily. Swallow whole.   atorvastatin  (LIPITOR) 40 MG tablet Take 1 tablet (40 mg total) by mouth daily.   irbesartan  (AVAPRO ) 75 MG tablet Take 1 tablet (75 mg total) by mouth daily.   vitamin D3 (CHOLECALCIFEROL ) 25 MCG tablet Take 1 tablet (1,000 Units total) by mouth daily.   No facility-administered encounter medications on file as of 02/27/2024.    Past Medical History:  Diagnosis Date   Arthritis    Hypertension    Stroke (HCC) 01/09/2024   Stated he had 2 strokes.    Past Surgical History:  Procedure Laterality Date   TEE WITHOUT CARDIOVERSION N/A 01/14/2024   Procedure: ECHOCARDIOGRAM, TRANSESOPHAGEAL;  Surgeon: Wilburn Keller BROCKS, MD;  Location: [facility] ORS;  Service: Cardiovascular;  Laterality: N/A;    Family History  Problem Relation Age of Onset   Diabetes Mother    Hypertension Mother    Heart failure Maternal Grandmother     Social History   Socioeconomic History   Marital status: Single    Spouse name: Not on file   Number of children: Not on file   Years of education: Not on file   Highest education level: Not on file  Occupational History   Not on file  Tobacco Use   Smoking status: Every Day    Current packs/day: 0.50    Types: Cigarettes, Cigars   Smokeless tobacco: Never   Tobacco comments:    Patient currently using a  Nicotine  patch and chewing gum to help him stop, states since strokes he went from smoking a pack a day to 5-6.  Vaping Use   Vaping status: Never Used  Substance and Sexual Activity   Alcohol use: Not Currently    Alcohol/week: 2.0 standard drinks of alcohol    Types: 2 Cans of beer per week   Drug use: Yes    Types: Marijuana   Sexual activity: Not Currently  Other Topics Concern   Not on file  Social History Narrative   Not on file   Social Drivers of Health   Financial Resource Strain: Not on file  Food Insecurity: No Food Insecurity (01/10/2024)   Hunger Vital Sign    Worried About Running Out of Food in the Last Year: Never true    Ran Out of Food in the Last Year: Never true  Transportation Needs: No Transportation Needs (01/10/2024)   PRAPARE - Administrator, Civil Service (Medical): No    Lack of Transportation (Non-Medical): No  Physical Activity: Not on file  Stress: Not on file  Social Connections: Not on file  Intimate Partner Violence: Not At Risk (01/10/2024)   Humiliation, Afraid, Rape, and Kick questionnaire    Fear of Current or Ex-Partner: No    Emotionally Abused: No    Physically Abused: No    Sexually Abused: No    Review of Systems  Constitutional:  Negative for chills, diaphoresis, fever, malaise/fatigue and weight loss.  Respiratory:  Negative for cough, hemoptysis, sputum production, shortness of breath and wheezing.   Cardiovascular:  Negative for chest pain, palpitations, orthopnea, claudication, leg swelling and PND.  Gastrointestinal:  Negative for abdominal pain, heartburn, nausea and vomiting.  Genitourinary:  Negative for flank pain.  Musculoskeletal:  Negative for back pain, joint pain, myalgias and neck pain.  Neurological:  Negative for dizziness, tingling, tremors, sensory change, focal weakness, seizures, loss of consciousness and headaches.  Psychiatric/Behavioral:  Positive for depression. Negative for hallucinations,  memory loss, substance abuse and suicidal ideas. The patient is not nervous/anxious and does not have insomnia.   All other systems reviewed and are negative.  Flowsheet Row Office Visit from 02/27/2024 in [facility]  PHQ-9 Total Score 10      02/27/2024    9:11 AM  GAD 7 : Generalized Anxiety Score  Nervous, Anxious, on Edge 1  Control/stop worrying 1  Worry too much - different things 2  Trouble relaxing 1  Restless 0  Easily annoyed or irritable 1  Afraid - awful might happen 2  Total GAD 7 Score 8  Anxiety Difficulty Somewhat difficult      Objective    BP 127/82 (BP Location: Right Arm, Patient Position: Sitting, Cuff Size: Normal)  Pulse (!) 38 Comment: Fluctuating in the 30s to 60.  Stated in the hospital it was doing the same.  Ht 5' 11 (1.803 m)   Wt 221 lb 3.2 oz (100.3 kg)   SpO2 98%   BMI 30.85 kg/m   Physical Exam Constitutional:      General: He is not in acute distress.    Appearance: Normal appearance. He is not ill-appearing, toxic-appearing or diaphoretic.  HENT:     Head: Normocephalic.     Nose: Nose normal.     Mouth/Throat:     Mouth: Mucous membranes are moist.  Eyes:     Extraocular Movements: Extraocular movements intact.     Conjunctiva/sclera: Conjunctivae normal.     Pupils: Pupils are equal, round, and reactive to light.  Cardiovascular:     Rate and Rhythm: Normal rate. Rhythm irregular.     Heart sounds: No murmur heard.    No friction rub. No gallop.     Comments: Bradycardiac on pulse ox Pulmonary:     Effort: Pulmonary effort is normal. No respiratory distress.     Breath sounds: Normal breath sounds. No stridor. No wheezing, rhonchi or rales.  Chest:     Chest wall: No tenderness.       Comments: Pea size lump on 9 oclock R breast, superficial, mobile, nontender, bordered defined Musculoskeletal:     Right lower leg: No edema.     Left lower leg: No edema.  Skin:    General: Skin is warm and  dry.     Capillary Refill: Capillary refill takes less than 2 seconds.  Neurological:     General: No focal deficit present.     Mental Status: He is alert and oriented to person, place, and time. Mental status is at baseline.     GCS: GCS eye subscore is 4. GCS verbal subscore is 5. GCS motor subscore is 6.     Cranial Nerves: Cranial nerve deficit, dysarthria and facial asymmetry present.     Motor: Weakness and pronator drift present.     Coordination: Coordination normal.     Gait: Gait abnormal.     Comments: Residual affects of CVA  Psychiatric:        Mood and Affect: Mood normal. Affect is flat.        Speech: Speech normal.        Behavior: Behavior normal.        Thought Content: Thought content normal.        Judgment: Judgment normal.    EKG: normal EKG, normal sinus rhythm, PAC's (new). With prolonged QT (noted from previous).      Assessment & Plan:  Assessment and Plan     Mass of lower outer quadrant of right breast Assessment & Plan: Pea size lump on 9 oclock R breast, superficial, mobile, nontender, bordered defined Lipoma vs cyst vs abnormal cell growth No breast cancer hx as far as pt is aware Order US  for breast tissue lump  Orders: -     US  CHEST SOFT TISSUE  History of cerebrovascular accident (CVA) with residual deficit -     Aspirin ; Take 1 tablet (81 mg total) by mouth daily. Swallow whole.  Dispense: 90 tablet; Refill: 3 -     Atorvastatin  Calcium ; Take 1 tablet (40 mg total) by mouth daily.  Dispense: 90 tablet; Refill: 3 -     Blood Pressure Cuff; 1 Device by Does not apply route daily.  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) Assessment & Plan: Residual from CVA Starting speech therapy next week   Gait  abnormality Assessment & Plan: Residual affects from CVA Continue to work with PT to strength L side Needs rolling walker, improving on ADLs, but continues to need assistance at home    Left-sided weakness Assessment & Plan: Residual from CVA Continue ambulating with rolling walker Continue working with PT on strengthening L side Wear proper fitting shoes, be aware of rugs, stairs, steps, cords Strength 4/5 on L   Primary hypertension Assessment & Plan: Chronic, controlled Continue Amlodipine  2.5mg  daily Continue Irbesartan  75mg  daily BP cuff ordered for pt GOAL<130/80 Limit sodium intake, processed food Encourage exercise activity as tolerated  Orders: -     amLODIPine  Besylate; Take 1 tablet (2.5 mg total) by mouth daily.  Dispense: 90 tablet; Refill: 3 -     Irbesartan ; Take 1 tablet (75 mg total) by mouth daily.  Dispense: 90 tablet; Refill: 3 -     Blood Pressure Cuff; 1 Device by Does not apply route daily.  Dispense: 1 each; Refill: 0  Tobacco use disorder Assessment & Plan: Chronic, improving Motivated Down to 5-6 cigarettes per day Nicotine  patches ordered for pt Discussed increased risk of another of CV event with use  Orders: -     Nicotine ; Place 1 patch (21 mg total) onto the skin daily.  Dispense: 28 patch; Refill: 1  Prediabetes Assessment & Plan: A1C = 5.7 Repeat in 6 weeks Continue to make conscious decisions for well balanced diet smaller portions with increase protein, fruits, veggies, water as drink of choice, decrease starches, processed foods, and saturated fats. Increase weekly exercise - 150 minutes per week.     Abnormal pulse oximetry Assessment & Plan: During rooming pt's pulse oxe reading heart rate ranging form 38 to 70 Concerning for bradycardia or arrhythmia EKG performed Normal Sinus with PACs Has Cards appt on 03/03/24 for post stroke work up Denies symptoms today, no chest pain, palpitations, tenderness, DOB, SOB,  diaphoresis BP normal  Orders: -     EKG 12-Lead  Bradycardia Assessment & Plan: Rooming pulse ox showed HR ranging from 38 - 70 - may be device related Heart sounds, were irregular in rate EKG performed = NSR with PACs, HR 60s - reassuring Asymptomatic Bp normal Cards appt on 7/16 - appreciate any further eval  Orders: -     EKG 12-Lead  Encounter to establish care with new doctor  Obesity (BMI 30-39.9) Assessment & Plan: Lifestyle counseling given Pt recovering from CVA, working with PT to improve mobility He is motivated to get healthy   Vitamin D  deficiency Assessment & Plan: Continue Vitamin D  supplement  Orders: -     Vitamin D3; Take 1 tablet (1,000 Units total) by mouth daily.  Dispense: 90 tablet; Refill: 3  Depression, major, single episode, moderate (HCC) Assessment & Plan: PHQ9= 10 Pt feels situational d/t stroke and rehab Medication and therapy discussed - pt would like to hold off, he feels he is improving from initial incident.  Has support at home    Will discuss vaccines and screenings at next visit.   Return in about 7 weeks (around 04/16/2024) for chronic disease mgmt and labs (any time after 04/12/24).   I, Curtis DELENA Boom, FNP, have  reviewed all documentation for this visit. The documentation on 02/27/24 for the exam, diagnosis, procedures, and orders are all accurate and complete.  Curtis DELENA Boom, FNP   Cognitive Rehabilitation After a Stroke A stroke happens when there is not enough blood flow to your brain. It can affect your thinking and memory. Rehabilitation, also called rehab, can help improve these areas. It can help with your ability to focus (attention span), make decisions, and recall information. It also can help with language and perception. Rehab can help to improve your quality of life. What is cognitive rehab? Cognitive rehab is a program to help you improve your thinking skills after a stroke. It can help with memory,  problem-solving, and communication. Therapy focuses on: Improving brain function. You may learn to break down tasks into simple steps. Coping with thinking problems. You may learn ways to help improve your memory. You may also do activities to help your memory. These may include naming objects or describing pictures. Types of rehab Cognitive rehab refers to a group of therapies that are done by a specialist. These may include: Speech-language therapy. This can help you understand and communicate better. Occupational therapy. This can help you with daily activities. Music therapy. This can help with stress, anxiety, and depression. It may involve listening to music, singing, or playing instruments. Physical therapy. This can help you get stronger and move better. These therapies may involve: Helping you learn ways to cope with memory problems. This may include setting alarms to remind you to take medicines. You also may learn new techniques to help your memory. Using virtual reality or video games to help you remember things like words and the names of objects. Exercise to help send more blood to your brain. This information is not intended to replace advice given to you by your health care provider. Make sure you discuss any questions you have with your health care provider.   Speech-Language Therapy After a Stroke A stroke can damage parts of the brain that affect speech and language. You may: Have trouble finding words for what you want to say. Have trouble putting words together to form sentences. Have difficulty understanding what people are saying. Have slurred speech or sound different when you talk. Find other people have trouble understanding you. A stroke may also make it difficult or unsafe for you to swallow food and liquids. Speech-Language Therapy Speech-language therapy is a common treatment during stroke recovery. It may include exercising your speech muscles and practicing  speech. It may help you to: Learn to talk and have conversations. Use the right words to express ideas. Learn to speak more clearly so others can understand you. Use gestures, sign language, or other methods to express yourself (aided communication). These can be used in place of speech or to add to it. Some aided communication may involve the use of electronic devices. Restore the ability to read and write. Improve your swallowing muscles to assist in eating. Assess your ability to swallow various foods safely. Examples of exercises Your speech-language pathologist may work on specific exercises with you based on your condition. Examples of these exercises include: Holding your breath while swallowing, followed by a cough. Tongue movements, such as sticking out your tongue, holding it against the roof of your mouth, and moving it from side to side. Puckering your lips as if to give a kiss. Practicing smiles and frowns. Attempting to make various consonant and vowel sounds. Other communication exercises Other communication exercises may include: Computer-assisted  exercises that assist in specific mouth movements, or making certain sounds, words, or sentences. Looking at pictures and naming objects. Speaking in a rhythm or song-like beat (Melodic Intonation Therapy). Learning how to use assistive devices to improve communication. Summary A stroke can cause damage to the part of your brain that affects speech, the ability to swallow, and the ability to use and understand language. Speech-language therapy is a common treatment after a stroke. It can help you learn to communicate again. This therapy may include exercising your speech muscles and practicing speech. This information is not intended to replace advice given to you by your health care provider. Make sure you discuss any questions you have with your health care provider.

## 2024-02-27 NOTE — Assessment & Plan Note (Signed)
 PHQ9= 10 Pt feels situational d/t stroke and rehab Medication and therapy discussed - pt would like to hold off, he feels he is improving from initial incident.  Has support at home

## 2024-02-27 NOTE — Assessment & Plan Note (Signed)
 During rooming pt's pulse oxe reading heart rate ranging form 38 to 70 Concerning for bradycardia or arrhythmia EKG performed Normal Sinus with PACs Has Cards appt on 03/03/24 for post stroke work up Denies symptoms today, no chest pain, palpitations, tenderness, DOB, SOB, diaphoresis BP normal

## 2024-02-27 NOTE — Assessment & Plan Note (Signed)
 A1C = 5.7 Repeat in 6 weeks Continue to make conscious decisions for well balanced diet smaller portions with increase protein, fruits, veggies, water as drink of choice, decrease starches, processed foods, and saturated fats. Increase weekly exercise - 150 minutes per week.

## 2024-02-27 NOTE — Assessment & Plan Note (Signed)
 Lifestyle counseling given Pt recovering from CVA, working with PT to improve mobility He is motivated to get healthy

## 2024-02-27 NOTE — Assessment & Plan Note (Signed)
 Working with speech starting next week  Able to eat and drink fine

## 2024-02-27 NOTE — Assessment & Plan Note (Addendum)
 Residual affects from CVA Continue to work with PT to strength L side Needs rolling walker, improving on ADLs, but continues to need assistance at home

## 2024-02-27 NOTE — Assessment & Plan Note (Signed)
 Residual from CVA Continue ambulating with rolling walker Continue working with PT on strengthening L side Wear proper fitting shoes, be aware of rugs, stairs, steps, cords Strength 4/5 on L

## 2024-02-27 NOTE — Assessment & Plan Note (Signed)
 Chronic, improving Motivated Down to 5-6 cigarettes per day Nicotine  patches ordered for pt Discussed increased risk of another of CV event with use

## 2024-03-01 ENCOUNTER — Other Ambulatory Visit: Payer: Self-pay

## 2024-03-01 DIAGNOSIS — E559 Vitamin D deficiency, unspecified: Secondary | ICD-10-CM

## 2024-03-02 ENCOUNTER — Ambulatory Visit: Admitting: Speech Pathology

## 2024-03-02 ENCOUNTER — Encounter: Admitting: Occupational Therapy

## 2024-03-02 ENCOUNTER — Ambulatory Visit: Admitting: Physical Therapy

## 2024-03-02 DIAGNOSIS — R278 Other lack of coordination: Secondary | ICD-10-CM

## 2024-03-02 DIAGNOSIS — R262 Difficulty in walking, not elsewhere classified: Secondary | ICD-10-CM

## 2024-03-02 DIAGNOSIS — R471 Dysarthria and anarthria: Secondary | ICD-10-CM

## 2024-03-02 DIAGNOSIS — R2681 Unsteadiness on feet: Secondary | ICD-10-CM

## 2024-03-02 DIAGNOSIS — R2689 Other abnormalities of gait and mobility: Secondary | ICD-10-CM

## 2024-03-02 DIAGNOSIS — M6281 Muscle weakness (generalized): Secondary | ICD-10-CM

## 2024-03-02 DIAGNOSIS — R1312 Dysphagia, oropharyngeal phase: Secondary | ICD-10-CM

## 2024-03-02 NOTE — Therapy (Signed)
 OUTPATIENT SPEECH LANGUAGE PATHOLOGY  SWALLOW EVALUATION DYSARTHRIA EVALUATION   Patient Name: Mark Mcintosh MRN: [id number] DOB:[birth year], 53 y.o., male Today's Date: 03/02/2024  PCP: Curtis Boom, FNP REFERRING PROVIDER: Fidela Ned, NP   End of Session - 03/02/24 1038     Visit Number 1    Number of Visits 17    Date for SLP Re-Evaluation 04/27/24    Authorization Type [employer]    Progress Note Due on Visit 10    SLP Start Time 0930    SLP Stop Time  1000    SLP Time Calculation (min) 30 min    Activity Tolerance Patient tolerated treatment well          Past Medical History:  Diagnosis Date   Arthritis    Hypertension    Stroke (HCC) 01/09/2024   Stated he had 2 strokes.   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) 02/27/2024   Gait abnormality 02/27/2024   Left-sided weakness 02/27/2024   Prediabetes 02/27/2024   Abnormal pulse oximetry 02/27/2024   Vitamin D  deficiency 02/27/2024   Bradycardia 02/27/2024   Depression, major, single episode, moderate (HCC) 02/27/2024   Coping style affecting medical condition 01/20/2024   Right pontine cerebrovascular accident (HCC) 01/14/2024   Acute CVA (cerebrovascular accident) (HCC) 01/09/2024   Tobacco use disorder 01/09/2024   Obesity (BMI 30-39.9) 01/09/2024   Hypertension     ONSET DATE: 01/09/2024   REFERRING DIAG:  R47.1 (ICD-10-CM) - Dysarthria  R13.10  (ICD-10-CM) - Dysphagia    THERAPY DIAG:  Dysarthria and anarthria  Dysphagia, oropharyngeal phase  Rationale for Evaluation and Treatment Rehabilitation  SUBJECTIVE:   SUBJECTIVE STATEMENT: Pt pleasant, good historian, reports good progress in therapy following CVA Pt accompanied by: self  PERTINENT HISTORY and DIAGNOSTIC FINDINGS: Pt is a right handed 53 year old male with past medical history of hypertension and tobacco use disorder. He presented to [facility] ED on 01/09/2024 with left sided weakness. MRI on 01/10/2024 revealed 1. Acute infarcts in the posterior limb of the right internal  capsule, the overlying right frontal white matter, and pons. Given  involvement of multiple vascular territories, consider an embolic  etiology.  2. Multiple remote infarcts and chronic microvascular ischemic  disease. Pt received ST services at [facility] from 01/16/2024 thru 01/29/2024.   PAIN:  Are you having pain? No  FALLS: Has patient fallen in last 6 months?  No  LIVING ENVIRONMENT: Lives with: lives alone Lives in: House/apartment  PLOF:  Level of assistance: Independent with ADLs, Independent with IADLs Employment: Full-time employment   PATIENT GOALS  to improve speech and swallow abilities  OBJECTIVE:   COGNITION: Overall cognitive status: No family/caregiver present to determine baseline cognitive functioning Functional deficits: pt reports subjective change in memory - will provide ongoing assessment  ORAL MOTOR EXAMINATION Facial : Symmetry impaired: Impaired left, Suspect CN VII impairment, Strength impaired: Impaired left, Suspect CN VII impairment, Sensation impaired: Impaired left, Suspect CN V impairment Lingual: Symmetry Impaired: Impaired left, Suspect  CN XII impairment, Strength Impaired: Impaired left, Suspect CN XII impairment, Sensation Impaired: Suspect CN IX impairment (posterior 1/3) Velum: Comment: intermittent hyponasality ? Mandible: WFL Cough: WFL Voice: Breathy,  Wet   CLINICAL SWALLOW ASSESSMENT:   Current diet: regular and thin liquids Dentition: adequate natural dentition Feeding: able to feed self Consistencies tested: Thin Liquid: Presentation: Cup, Straw, and Self-fed Oral Phase: Impaired: reduced labial seal Pharyngeal Phase: WFL Puree: Presentation: Spoon and Self-fed Oral Phase: Impaired: reduced labial seal, reduced lingual movement/coordination, left pocketing, anterior loss/spillage, and prolonged bolus formation Pharyngeal Phase: WFL Regular: Presentation: Self-fed Oral Phase: Impaired: reduced labial seal, reduced lingual movement/coordination, left pocketing, anterior loss/spillage, and prolonged bolus formation Pharyngeal Phase: WFL   Evaluation findings: Patient presents with s/sx of (oral, pharyngeal, oropharyngeal) dysphagia characterized by moderate oral phase difficulties d/t left labial spillage, decreased bolus manipulation/cohesion.   Aspiration risk factors:History of dysphagia and History of CVA Overall aspiration risk:Mild Diet Recommendations: regular and thin liquids Precautions:Minimize environmental distractions, Slow rate, Small sips/bites, Seated upright 90 degrees, Remain upright for at least 30 minutes after meals, Lingual sweep for clearance of pocketing, and Monitor for anterior loss Supervision: Patient able to feed self Oral care recommendations:Oral care BID Follow-up recommendations: Therapy as outlined in treatment plan below     PATIENT REPORTED OUTCOME MEASURES (PROM):  The Communication Effectiveness Survey is a patient-reported outcome measure in which the patient rates their own effectiveness in different communication situations. A higher score indicates greater effectiveness.   Pt's self-rating was 28/32.   Having a conversation with a family member or friends at home. 4- Very Effective Participating in conversation with strangers in a quiet place. 4- Very Effective Conversing with a familiar  person over the telephone. 4- Very Effective Conversing with a stranger over the telephone. 3 Being part of a conversation in a noisy environment (social gathering). 3 Speaking to a friend when you are emotionally upset or you are angry. 4- Very Effective Having a conversation while traveling in a car. 4- Very Effective Having a conversation with someone at a distance (across a room). 2  TODAY'S TREATMENT:  Pt instructed to try using a small cup rim and use a straw to help reduce left labial spillage. Also instructed to introduce boluses to his right side. Instructed in completing 3 sets of 10 reps at least 2 times per day of EMST and bring into session for potential re-calibration.     PATIENT EDUCATION: Education details: see above Person educated: Patient Education method: Explanation and written cues Education comprehension: verbalized understanding and needs further education  HOME EXERCISE PROGRAM:     Aspiration Precautions  Compensatory Swallow strategies  Complete EMST   GOALS: Goals reviewed with patient? Yes  SHORT TERM GOALS: Target date: 10 sessions  With Mod I, pt will keep food and liquid in mouth while eating without losing the bolus out of the front of the mouth to safely consume least restrictive diet. Baseline: Goal status: INITIAL  2.  With Mod I, pt will complete lingual strengthening exercises (anterior and posterior) x 15 times in order to increase muscle strength to reduce oral residue given regular textures.  Baseline:  Goal status: INITIAL  3.  With Mod I, patient will complete 3 sets of 10 repetitions with EMST set at 100cmH2O with self-reported effortful of 7 out of 10. Baseline:  Goal status: INITIAL  4.  With Mod I, patient will use dysarthria compensations (slow, loud, overpronounce, pause) in 2-3 sentence responses with 95% accuracy.  Baseline:  Goal status: INITIAL   LONG TERM GOALS: Target date: 04/27/2024  With Mod I, pt will utilize  safe swallwoing strategies in order to reduce risk of aspiration, dehydration and weight loss with consuming regular textures. Baseline:  Goal status: INITIAL  2.  With Mod I, pt will demonstrate the ability to adequately self-monitor swallowing skills and perform appropriate compensatory techniques to reduce s/s of aspiration and to safely consume least restrictive diet.     Baseline:  Goal status: INITIAL  3.  With Mod I, patient will improve speech intelligibility for  paragraph by controlling rate of speech, over-articulation, and increased loudness to achieve 95% intelligibility.  Baseline:  Goal status: INITIAL   ASSESSMENT:  CLINICAL IMPRESSION: Patient is a 53 year old right handed male who was seen today for an dysarthria and dysphagia evaluation d/t recent right internal capsule and pons CVA.    Pt presents with mild to moderate flaccid dysarthria d/t left side facial and lingual weakness that in reduced lip seal (during production of plosives) and imprecise lingual articulation. Pt's vocal quality was perceptionally wetter the longer that he talked. He reports that I start slobbering on the left side when I talk a lot.  Pt presents with mild oropharyngeal dysphagia that is c/b reduced lingual manipulation of boluses and left labial spillage of liquids. Pt reports my swallowing is decent but when I drink water it comes out on the left side. He reports drinking from a large insulated cup rim.    OBJECTIVE IMPAIRMENTS include dysarthria and dysphagia. These impairments are limiting patient from return to work, effectively communicating at home and in community, and safety when swallowing. Factors affecting potential to achieve goals and functional outcome are Multiple remote infarcts and chronic microvascular ischemic. Patient will benefit from skilled SLP services to address above impairments and improve overall function.  REHAB POTENTIAL: Good  PLAN: SLP FREQUENCY:  1-2x/week  SLP DURATION: 8 weeks  PLANNED INTERVENTIONS: Aspiration precaution training, Pharyngeal strengthening exercises, Diet toleration management , Trials of upgraded texture/liquids, Oral motor exercises, SLP instruction and feedback, Compensatory strategies, and Patient/family education    Mark Mcintosh B. Rubbie, M.S., CCC-SLP, Tree surgeon Certified Brain Injury Specialist [facility]  [facility] Rehabilitation Services Office [phone] Ascom [phone] Fax [phone]

## 2024-03-02 NOTE — Therapy (Signed)
 OUTPATIENT PHYSICAL THERAPY NEURO TREATMENT   Patient Name: Mark Mcintosh MRN: [id number] DOB:October 03, 1970, 53 y.o., male Today's Date: 03/02/2024   PCP: None REFERRING PROVIDER: [name], PA-C  END OF SESSION:  PT End of Session - 03/02/24 0850     Visit Number 3    Number of Visits 25    Date for PT Re-Evaluation 05/11/24    PT Start Time 0850    PT Stop Time 0930    PT Time Calculation (min) 40 min    Equipment Utilized During Treatment Gait belt    Activity Tolerance Patient tolerated treatment well    Behavior During Therapy [facility] for tasks assessed/performed           Past Medical History:  Diagnosis Date   Arthritis    Hypertension    Stroke (HCC) 01/09/2024   Stated he had 2 strokes.   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) 02/27/2024   Gait abnormality 02/27/2024   Left-sided weakness 02/27/2024   Prediabetes 02/27/2024   Abnormal pulse oximetry 02/27/2024   Vitamin D  deficiency 02/27/2024   Bradycardia 02/27/2024   Depression, major, single episode, moderate (HCC) 02/27/2024   Coping style affecting medical condition 01/20/2024   Right pontine cerebrovascular accident (HCC) 01/14/2024   Acute CVA (cerebrovascular accident) (HCC) 01/09/2024   Tobacco use disorder 01/09/2024   Obesity (BMI 30-39.9) 01/09/2024   Hypertension     ONSET DATE: 01/09/24  REFERRING DIAG: I63.9 (ICD-10-CM) - Acute CVA (cerebrovascular  accident) (HCC)   THERAPY DIAG:  Muscle weakness (generalized)  Other lack of coordination  Unsteadiness on feet  Difficulty in walking, not elsewhere classified  Other abnormalities of gait and mobility  Rationale for Evaluation and Treatment: Rehabilitation  SUBJECTIVE:                                                                                                                                                                                             SUBJECTIVE STATEMENT: Pt reports that he has been feeling good.  No falls reports that he only uses 4ww for longer distance.   Pt accompanied by: self  PERTINENT HISTORY:  Pt admitted to [facility] ED on 01/09/24 for progressively worsening L sided-weakness. Per ED provider note:  Patient presents emergency department for left-sided weakness starting yesterday afternoon, continued today.  Seems to be somewhat intermittent.  Patient's lab work today shows a reassuring CBC with a reassuring chemistry.  Patient's ethanol is negative.  INR is normal.  However patient's examination is concerning given his left upper extremity weakness pronator drift and mild left facial droop.  Will obtain an MRI of the brain to further evaluate.  Patient agreeable to plan of care.   Patient's MRI unfortunately shows multiple acute infarcts concerning for embolic showering.  Will dose aspirin .  Will admit to the hospital service for further workup and treatment.  Symptoms started yesterday (approximately 30 hours ago) patient is well outside of any therapeutic window.  Pt underwent PT in Acute Care and was admitted to [facility] from 5/29-6/13.  PMH includes arthritis, HTN  PAIN:  Are you having pain? No  PRECAUTIONS: None  RED FLAGS: None   WEIGHT BEARING RESTRICTIONS: No  FALLS: Has patient fallen in last 6 months? Yes. Number of falls 1- pt reports that he tried to get up at home after returning from [facility] and he got up too fast and fell. 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  DIAGNOSTIC FINDINGS: via chart  From 01/09/24 MRI Brain W/O Contrast: IMPRESSION: 1. Acute infarcts in the posterior limb of the right internal capsule, the overlying right frontal white matter, and pons. Given involvement of multiple vascular territories, consider an embolic etiology. 2. Multiple remote infarcts and chronic microvascular ischemic disease, detailed above.  From 01/10/24 CT Angio Head Neck W W/O CM: IMPRESSION: 1. No acute intracranial hemorrhage or mass effect. 2. No large vessel occlusion, hemodynamically significant stenosis, or aneurysm in the head or neck. 3. Remote lacunar infarcts in the genu of the left internal capsule, globus pallidus, and right corona radiata. 4. Acute infarct involving the posterior limb of the right internal capsule is less well appreciated at CT. 5. Moderately advanced periventricular white matter disease for age.  COGNITION: Overall cognitive status: Within functional limits for tasks assessed   SENSATION: WFL  COORDINATION: Heel to shin: Slower on L, WFL on R Finger to nose: Slower on L with overshooting on first 2 reps, WFL on R   POSTURE: rounded shoulders, forward head, and weight shift right   LOWER EXTREMITY MMT:    MMT Right Eval Left Eval  Hip flexion 4- 3+  Hip extension    Hip abduction 5 5  Hip adduction 4+ 4+  Hip internal rotation    Hip  external rotation     Knee flexion 5 4+  Knee extension 5 4+  Ankle dorsiflexion 5 4-  Ankle plantarflexion 5 4-  Ankle inversion    Ankle eversion    (Blank rows = not tested)  BED MOBILITY:  Not tested- pt reports being IND  TRANSFERS: Sit to stand:  Modified independence  Assistive device utilized: None     Stand to sit: Modified independence  Assistive device utilized: None     Chair to chair: SBA  Assistive device utilized: None        STAIRS: Findings: Level of Assistance: CGA, Stair Negotiation Technique: Alternating Pattern  with Bilateral Rails, Number of Stairs: 4, Height of Stairs: 6   , and Comments: Pt with decreased foot clearance bilaterally L>R , heavy UE support when descending GAIT: Findings: Gait Characteristics: step through pattern, decreased arm swing- Left, decreased step length- Left, decreased stance time- Left, decreased ankle dorsiflexion- Left, lateral lean- Left, wide BOS, poor foot clearance- Right, and poor foot clearance- Left, Distance walked: approx 50 feet, Assistive device utilized:None, Level of assistance: CGA, and Comments: Ambulation for and into/out of clinic  FUNCTIONAL TESTS:  5 times sit-to-stand: 17.9 seconds, pt utilizing arms on knees to assist with standing  : 0.56 m/s with 4WW and CGA; 0.73 m/s with no AD and CGA  BERG Balance Scale: 33/56  FGA: 18/30 on 02/25/24  : 795 ft with 4WW on 02/25/24  PATIENT SURVEYS:  Stroke Impact Scale: 45/80                                                                                                                              TREATMENT DATE: 03/02/24   Ambulation in to rehab gym with 4ww x 27ft.  Nustep reciprocal movement and cardiovascular endurance training  x 7 min level 1-5 with interval resistance. Cues for consistent SPM through varied resistance.   Narrow BOS x 1 min  Narrow BOS eyes closed 2 x 10 sec  Tandem stance 20sec x 3 with no to light UE support. Increased L  lateral LOB with increased fatigue:   Unweighted gait without UE support x 175ft and weighted 2 x 320ft. Cues for improved hip flexion and DF to improve step height and allow heel contact. As well as improved trunkal activation to improve reciprocal movement pattern.   Sit<>stand with 3KG ball press overhead. 2 x 10  Partial lunge over half bolster x 10 bil with 3# AW  Lateral step with BLE over half bolster x 10 bil with 3# AW  Throughout session PT providedCGA for safety unless otherwise stated with tactile cues for improved postural control and parascapular activation on the L side. Occasional cues for improved step length and height with weighted gait as listed.    PATIENT EDUCATION: Education details: Pt educated on examination findings and thorough demonstration/explanation of new HEP provided during session Person educated: Patient Education method: Explanation, Demonstration, and Handouts Education comprehension: verbalized understanding and returned demonstration  HOME EXERCISE PROGRAM: Access Code: [email or web address]  URL: [email or web address]  Date: 02/17/2024  Prepared by: Darryle Patten  Exercises: - Seated March  - 1 x daily - 3 x weekly - 3 sets - 10 reps  - Standing March with Counter Support  - 1 x daily -  3 x weekly - 3 sets - 10 reps  - Mini Squat with Counter Support  - 1 x daily - 3 x weekly - 3 sets - 10 reps  - Heel Toe Raises with Counter Support  - 1 x daily - 3 x weekly - 3 sets - 10 reps   GOALS: Goals reviewed with patient? Yes  SHORT TERM GOALS: Target date: 03/09/2024   Patient will be independent in home exercise program to improve strength/mobility for better functional independence with ADLs. Baseline: HEP reviewed during eval Goal status: INITIAL   LONG TERM GOALS: Target date: 05/11/2024  Patient will increase their Stroke Impact Scale score by >10 points for improved function and independence Baseline: 45/80 Goal status: INITIAL  2.   Patient (< 50 years old) will complete five times sit to stand test in < 10 seconds indicating an increased LE strength and improved balance. Baseline: 17.9 seconds with UE support Goal status: INITIAL  3.  Patient will increase 10 meter walk test to >0.8 m/s as to improve gait speed for better community ambulation and to reduce fall risk. Baseline: 0.56 m/s with 4WW & 0.73 m/s with no AD  Goal status: INITIAL  4.  Patient will increase Berg Balance score by > 6 points to demonstrate decreased fall risk during functional activities. Baseline: 33/56 Goal status: INITIAL  5.  Patient will increase Functional Gait Assessment score to >20/30 as to reduce fall risk and improve dynamic gait safety with community ambulation. Baseline:18/30 on 02/25/24 Goal status: INITIAL  6.  Patient will increase six minute walk test distance by >50 ft for progression to community ambulator and improve gait ability Baseline: 784ft with 4WW on 02/25/24 Goal status: INITIAL  ASSESSMENT:  CLINICAL IMPRESSION: Pt responded well to physical performance measures as well as dynamic balance/LE strengthening initiated today and remained engaged and motivated throughout session.PT treatment focuse don functional strengthening and improved gait mechanics to improve step height/length with increased neuromotor recruitment with weighted gait as well as dynamic balance control with symmetry of WB with narrow BOS.   Pt would benefit from skilled PT intervention to improve his balance, tolerance for ambulation, and overall functional status to progress toward returning to work and to regain independence for ease of ADLs.   OBJECTIVE IMPAIRMENTS: Abnormal gait, decreased activity tolerance, decreased balance, decreased coordination, decreased endurance, decreased mobility, difficulty walking, decreased ROM, decreased strength, hypomobility, impaired perceived functional ability, impaired flexibility, impaired UE functional use,  improper body mechanics, and postural dysfunction.   ACTIVITY LIMITATIONS: carrying, lifting, bending, sitting, standing, squatting, stairs, transfers, bathing, reach over head, hygiene/grooming, and locomotion level  PARTICIPATION LIMITATIONS: meal prep, cleaning, laundry, driving, shopping, community activity, occupation, and yard work  PERSONAL FACTORS: Age and Fitness are also affecting patient's functional outcome.   REHAB POTENTIAL: Good  CLINICAL DECISION MAKING: Evolving/moderate complexity  EVALUATION COMPLEXITY: Moderate  PLAN:  PT FREQUENCY: 2x/week  PT DURATION: 12 weeks  PLANNED INTERVENTIONS: [id number]- PT Re-evaluation, 97750- Physical Performance Testing, 97110-Therapeutic exercises, 97530- Therapeutic activity, [id number]- Neuromuscular re-education, 97535- Self Care, [id number]- Manual therapy, [id number]- Gait training, [id number]- Orthotic Initial, [id number]- Orthotic/Prosthetic subsequent, [phone]- Canalith repositioning, [id number]- Splinting, [id number]- Electrical stimulation (manual), [id number]- Ultrasound, [id number]- Traction (mechanical), [phone] (1-2 muscles), [id number] (3+ muscles)- Dry Needling, Patient/Family education, Balance training, Stair training, Taping, Joint mobilization, Spinal mobilization, Vestibular training, Visual/preceptual remediation/compensation, Cognitive remediation, DME instructions, Cryotherapy, and Moist heat  PLAN FOR NEXT SESSION:   Continue LE strengthening, progress dynamic balance, weight shifting tasks to promote WB  on L LE,  Dynamic gait training.   Massie Dollar PT, DPT  Physical Therapist - [place]  [facility]  9:34 AM 03/02/24

## 2024-03-03 ENCOUNTER — Telehealth: Payer: Self-pay | Admitting: Family Medicine

## 2024-03-03 ENCOUNTER — Other Ambulatory Visit: Payer: Self-pay | Admitting: Family Medicine

## 2024-03-03 DIAGNOSIS — N6315 Unspecified lump in the right breast, overlapping quadrants: Secondary | ICD-10-CM

## 2024-03-03 DIAGNOSIS — N63 Unspecified lump in unspecified breast: Secondary | ICD-10-CM

## 2024-03-03 NOTE — Telephone Encounter (Signed)
 Copied from CRM [id number]. Topic: General - Other >> Mar 03, 2024 11:36 AM [place] B wrote: Reason for CRM: pt called in states wants to know if Dr Wellington has sent in a summary of his disability to disability and he wants to have someone named Mark Mcintosh pt girlfriend will drop off paperwork. He also wants to know why he has to keep doing this paperwork

## 2024-03-04 ENCOUNTER — Ambulatory Visit: Admitting: Physical Therapy

## 2024-03-04 ENCOUNTER — Ambulatory Visit

## 2024-03-04 ENCOUNTER — Ambulatory Visit: Admitting: Speech Pathology

## 2024-03-04 ENCOUNTER — Encounter

## 2024-03-04 DIAGNOSIS — R2689 Other abnormalities of gait and mobility: Secondary | ICD-10-CM

## 2024-03-04 DIAGNOSIS — R278 Other lack of coordination: Secondary | ICD-10-CM

## 2024-03-04 DIAGNOSIS — R262 Difficulty in walking, not elsewhere classified: Secondary | ICD-10-CM

## 2024-03-04 DIAGNOSIS — R1312 Dysphagia, oropharyngeal phase: Secondary | ICD-10-CM

## 2024-03-04 DIAGNOSIS — I639 Cerebral infarction, unspecified: Secondary | ICD-10-CM

## 2024-03-04 DIAGNOSIS — M6281 Muscle weakness (generalized): Secondary | ICD-10-CM

## 2024-03-04 DIAGNOSIS — R471 Dysarthria and anarthria: Secondary | ICD-10-CM

## 2024-03-04 DIAGNOSIS — R2681 Unsteadiness on feet: Secondary | ICD-10-CM

## 2024-03-04 NOTE — Telephone Encounter (Unsigned)
 Copied from CRM [id number]. Topic: General - Other >> Mar 04, 2024  8:36 AM Mark Mcintosh wrote: Reason for CRM: Patient is calling to get update on his disability paperwork. The paperwork was dropped off yesterday. The patient is requesting a callback at [phone]. Please contact the patient after 11 AM.

## 2024-03-04 NOTE — Therapy (Signed)
 OUTPATIENT SPEECH LANGUAGE PATHOLOGY  SWALLOW and DYSARTHRIA TREATMENT NOTE   Patient Name: Mark Mcintosh MRN: [id number] DOB:[birth year], 53 y.o., male Today's Date: 03/04/2024  PCP: Curtis Boom, FNP REFERRING PROVIDER: Fidela Ned, NP   End of Session - 03/04/24 1043     Visit Number 2    Number of Visits 17    Date for SLP Re-Evaluation 04/27/24    Authorization Type [employer]    Progress Note Due on Visit 10    SLP Start Time 1015    SLP Stop Time  1045    SLP Time Calculation (min) 30 min    Activity Tolerance Patient tolerated treatment well          Past Medical History:  Diagnosis Date   Arthritis    Hypertension    Stroke (HCC) 01/09/2024   Stated he had 2 strokes.   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) 02/27/2024   Gait abnormality 02/27/2024   Left-sided weakness 02/27/2024   Prediabetes 02/27/2024   Abnormal pulse oximetry 02/27/2024   Vitamin D  deficiency 02/27/2024   Bradycardia 02/27/2024   Depression, major, single episode, moderate (HCC) 02/27/2024   Coping style affecting medical condition 01/20/2024   Right pontine cerebrovascular accident (HCC) 01/14/2024   Acute CVA (cerebrovascular accident) (HCC) 01/09/2024   Tobacco use disorder 01/09/2024   Obesity (BMI 30-39.9) 01/09/2024   Hypertension     ONSET DATE: 01/09/2024   REFERRING DIAG:  R47.1 (ICD-10-CM) - Dysarthria  R13.10 (ICD-10-CM)  - Dysphagia    THERAPY DIAG:  Oropharyngeal dysphagia  Dysarthria and anarthria  Rationale for Evaluation and Treatment Rehabilitation  SUBJECTIVE:   PERTINENT HISTORY and DIAGNOSTIC FINDINGS: Pt is a right handed 53 year old male with past medical history of hypertension and tobacco use disorder. He presented to [facility] ED on 01/09/2024 with left sided weakness. MRI on 01/10/2024 revealed 1. Acute infarcts in the posterior limb of the right internal  capsule, the overlying right frontal white matter, and pons. Given  involvement of multiple vascular territories, consider an embolic  etiology.  2. Multiple remote infarcts and chronic microvascular ischemic  disease. Pt received ST services at [facility] from 01/16/2024 thru 01/29/2024.   PAIN:  Are you having pain? No  FALLS: Has patient fallen in last 6 months?  No  LIVING ENVIRONMENT: Lives with: lives alone Lives in: House/apartment  PLOF:  Level of assistance: Independent with ADLs, Independent with IADLs Employment: Full-time employment  SUBJECTIVE STATEMENT: Pt pleasant, good historian, reports good progress in therapy following CVA Pt accompanied by: self  PATIENT GOALS  to improve speech and swallow abilities  OBJECTIVE:   TODAY'S TREATMENT NOTE: Skilled treatment session targeted pt's dysarthria and dysphagia goals. SLP facilitated session by providing the following interventions:  He reports that using a straw helped prevent anterior lip spillage.   Pt brought his EMST device - able to perform task well but increased effort reported with current setting at 80cmH2O - when reduced to 60cmH2O pt able to  complete 3 sets of 8 repetitions with self-perceived effort level of 5 out of 10. Resistance increased by 1 complete turn with continued good ability to complete task and increased self-perceived effort level of 7 out of 10.   Pt instructed in anterior tongue tip strengthening using a tongue depressor and tongue tip pressure  behind front teeth to simulate IOPI exercise. Pt struggled with anterior tongue tip placement but improved with additional trials. Recommend he practice in front of a mirror.   Pt benefited from rare Min A when reading sentence level material to achieve 90% speech intelligibility.   PATIENT EDUCATION: Education details: see above Person educated: Patient Education method: Explanation and written cues Education comprehension: verbalized understanding and needs further education  HOME EXERCISE PROGRAM:     Aspiration Precautions  Compensatory Swallow strategies  Complete EMST   GOALS: Goals reviewed with patient? Yes  SHORT TERM GOALS: Target date: 10 sessions  With Mod I, pt will keep food and liquid in mouth while eating without losing the bolus out of the front of the mouth to safely consume least restrictive diet. Baseline: Goal status: INITIAL  2.  With Mod I, pt will complete lingual strengthening exercises (anterior and posterior) x 15 times in order to increase muscle strength to reduce oral residue given regular textures.  Baseline:  Goal status: INITIAL  3.  With Mod I, patient will complete 3 sets of 10 repetitions with EMST set at 100cmH2O with self-reported effortful of 7 out of 10. Baseline:  Goal status: INITIAL  4.  With Mod I, patient will use dysarthria compensations (slow, loud, overpronounce, pause) in 2-3 sentence responses with 95% accuracy. Baseline:  Goal status: INITIAL   LONG TERM GOALS: Target date: 04/27/2024  With Mod I, pt will utilize safe swallwoing strategies in order to reduce risk of aspiration, dehydration and weight loss with consuming regular textures. Baseline:  Goal status: INITIAL  2.  With Mod I, pt will demonstrate the ability to adequately self-monitor swallowing skills and perform appropriate compensatory techniques to reduce s/s of aspiration and to safely consume least restrictive diet.     Baseline:  Goal status:  INITIAL  3.  With Mod I, patient will improve speech intelligibility for  paragraph by controlling rate of speech, over-articulation, and increased loudness to achieve 95% intelligibility.  Baseline:  Goal status: INITIAL   ASSESSMENT:  CLINICAL IMPRESSION: Patient is a 53 year old right handed male who was seen today for an dysarthria and dysphagia treatment d/t recent right internal capsule and pons CVA.    Pt presents with mild to moderate flaccid dysarthria d/t left side facial and lingual weakness that in reduced lip seal (during production of plosives) and imprecise lingual articulation. Pt's vocal quality was perceptionally wetter the longer that he talked. He reports that I start slobbering on the left side when I talk a lot.  Pt presents with mild oropharyngeal dysphagia that is c/b reduced lingual manipulation of boluses and left labial spillage of liquids. Pt reports my swallowing is decent but when I drink water it comes out on the left side. He reports drinking from a large insulated cup rim.   Pt eager and good response to therapy. See the above treatment note for details.    OBJECTIVE IMPAIRMENTS include dysarthria and dysphagia. These impairments are limiting patient from return to work, effectively communicating at home and in community, and safety when swallowing. Factors affecting potential to achieve goals and functional outcome are Multiple remote infarcts  and chronic microvascular ischemic. Patient will benefit from skilled SLP services to address above impairments and improve overall function.  REHAB POTENTIAL: Good  PLAN: SLP FREQUENCY: 1-2x/week  SLP DURATION: 8 weeks  PLANNED INTERVENTIONS: Aspiration precaution training, Pharyngeal strengthening exercises, Diet toleration management , Trials of upgraded texture/liquids, Oral motor exercises, SLP instruction and feedback, Compensatory strategies, and Patient/family education    Kiannah Grunow B. Rubbie, M.S.,  CCC-SLP, Tree surgeon Certified Brain Injury Specialist [facility]  [facility] Rehabilitation Services Office [phone] Ascom [phone] Fax [phone]

## 2024-03-04 NOTE — Therapy (Signed)
 OUTPATIENT PHYSICAL THERAPY NEURO TREATMENT   Patient Name: Mark Mcintosh MRN: [id number] DOB:11-Nov-1970, 53 y.o., male Today's Date: 03/04/2024   PCP: None REFERRING PROVIDER: [name], PA-C  END OF SESSION:  PT End of Session - 03/04/24 0831     Visit Number 4    Number of Visits 25    Date for PT Re-Evaluation 05/11/24    Progress Note Due on Visit 10    PT Start Time 0848    PT Stop Time 0929    PT Time Calculation (min) 41 min    Equipment Utilized During Treatment Gait belt    Activity Tolerance Patient tolerated treatment well    Behavior During Therapy [facility] for tasks assessed/performed            Past Medical History:  Diagnosis Date   Arthritis    Hypertension    Stroke (HCC) 01/09/2024   Stated he had 2 strokes.   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) 02/27/2024   Gait abnormality 02/27/2024   Left-sided weakness 02/27/2024   Prediabetes 02/27/2024   Abnormal pulse oximetry 02/27/2024   Vitamin D  deficiency 02/27/2024   Bradycardia 02/27/2024   Depression, major, single episode, moderate (HCC) 02/27/2024   Coping style affecting medical condition 01/20/2024   Right pontine cerebrovascular accident (HCC) 01/14/2024   Acute CVA (cerebrovascular accident) (HCC) 01/09/2024   Tobacco use disorder 01/09/2024   Obesity (BMI 30-39.9) 01/09/2024   Hypertension     ONSET DATE: 01/09/24  REFERRING DIAG: I63.9  (ICD-10-CM) - Acute CVA (cerebrovascular accident) (HCC)   THERAPY DIAG:  Muscle weakness (generalized)  Acute CVA (cerebrovascular accident) (HCC)  Unsteadiness on feet  Difficulty in walking, not elsewhere classified  Other abnormalities of gait and mobility  Rationale for Evaluation and Treatment: Rehabilitation  SUBJECTIVE:                                                                                                                                                                                             SUBJECTIVE STATEMENT: Pt reports doing well today. Pt denies any recent falls/stumbles since prior session. Pt denies any updates to  medications or medical appointment since prior session. Pt reports good compliance with HEP when time permits.    Pt accompanied by: self  PERTINENT HISTORY: Pt admitted to [facility] ED on 01/09/24 for progressively worsening L sided-weakness. Per ED provider note:  Patient presents emergency department for left-sided weakness starting yesterday afternoon, continued today.  Seems to be somewhat intermittent.  Patient's lab work today shows a reassuring CBC with a reassuring chemistry.  Patient's ethanol is negative.  INR is normal.  However patient's examination is concerning given his left upper extremity weakness pronator drift and mild left facial droop.  Will obtain an MRI of the brain to further evaluate.  Patient agreeable to plan of care.   Patient's MRI unfortunately shows multiple acute infarcts concerning for embolic showering.  Will dose aspirin .  Will admit to the hospital service for further workup and treatment.  Symptoms started yesterday (approximately 30 hours ago) patient is well outside of any therapeutic window.  Pt underwent PT in Acute Care and was admitted to [facility] from 5/29-6/13.  PMH includes arthritis, HTN  PAIN:  Are you having pain? No  PRECAUTIONS: None  RED FLAGS: None   WEIGHT BEARING RESTRICTIONS: No  FALLS: Has  patient fallen in last 6 months? Yes. Number of falls 1- pt reports that he tried to get up at home after returning from [facility] and he got up too fast and fell. 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  DIAGNOSTIC FINDINGS: via chart  From 01/09/24 MRI Brain W/O Contrast: IMPRESSION: 1. Acute infarcts in the posterior limb of the right internal capsule, the overlying right frontal white matter, and pons. Given involvement of multiple vascular territories, consider an embolic etiology. 2. Multiple remote infarcts and chronic microvascular ischemic disease, detailed above.  From 01/10/24 CT Angio Head Neck W W/O CM: IMPRESSION: 1. No acute intracranial hemorrhage or mass effect. 2. No large vessel occlusion, hemodynamically significant stenosis, or aneurysm in the head or neck. 3. Remote lacunar infarcts in the genu of the left internal capsule, globus pallidus, and right corona radiata. 4. Acute infarct involving the posterior limb of the right internal capsule is less well appreciated at CT. 5. Moderately advanced periventricular white matter disease for age.  COGNITION: Overall cognitive status: Within functional limits for tasks assessed   SENSATION: WFL  COORDINATION: Heel to shin: Slower on L, WFL on R Finger to nose: Slower on L with overshooting on first 2 reps, WFL on R   POSTURE: rounded shoulders, forward head, and weight shift  right   LOWER EXTREMITY MMT:    MMT Right Eval Left Eval  Hip flexion 4- 3+  Hip extension    Hip abduction 5 5  Hip adduction 4+ 4+  Hip internal rotation    Hip external rotation     Knee flexion 5 4+  Knee extension 5 4+  Ankle dorsiflexion 5 4-  Ankle plantarflexion 5 4-  Ankle inversion  Ankle eversion    (Blank rows = not tested)  BED MOBILITY:  Not tested- pt reports being IND  TRANSFERS: Sit to stand: Modified independence  Assistive device utilized: None     Stand to sit: Modified independence  Assistive device utilized: None     Chair to chair: SBA  Assistive device utilized: None        STAIRS: Findings: Level of Assistance: CGA, Stair Negotiation Technique: Alternating Pattern  with Bilateral Rails, Number of Stairs: 4, Height of Stairs: 6   , and Comments: Pt with decreased foot clearance bilaterally L>R , heavy UE support when descending GAIT: Findings: Gait Characteristics: step through pattern, decreased arm swing- Left, decreased step length- Left, decreased stance time- Left, decreased ankle dorsiflexion- Left, lateral lean- Left, wide BOS, poor foot clearance- Right, and poor foot clearance- Left, Distance walked: approx 50 feet, Assistive device utilized:None, Level of assistance: CGA, and Comments: Ambulation for and into/out of clinic  FUNCTIONAL TESTS:  5 times sit-to-stand: 17.9 seconds, pt utilizing arms on knees to assist with standing  : 0.56 m/s with 4WW and CGA; 0.73 m/s with no AD and CGA  BERG Balance Scale: 33/56  FGA: 18/30 on 02/25/24  : 795 ft with 4WW on 02/25/24  PATIENT SURVEYS:  Stroke Impact Scale: 45/80                                                                                                                              TREATMENT DATE: 03/04/24   TA- To improve functional movements patterns for everyday tasks   Nustep reciprocal movement and cardiovascular endurance training and UE and LE reciprocal  movement trianing  x 7 min level 1-5 with interval resistance. Cues for consistent SPM through varied resistance.   In // bars no UE assist 2# AW donned  Stepping over 4 x 1/2 foam rollers with step through pattern of gait x 5 laps  Side step over 4 x 1/2 foam rollers x 5 laps   Gait with 2# AW x 320 ft   Sit<>stand with 4KG ball . 2 x 10 challenging  Gait with 2# AW x 320 ft   Lunge in // bars x 3 laps, cues for trunk posture   Sidestep then squat with RTB around ankles and with UE support 2 x 2 laps in // bars   NMR: To facilitate reeducation of movement, balance, posture, coordination, and/or proprioception/kinesthetic sense. On airex Adducted stance x 30 sec   - x 30 sec lateral head turns, vertical nods, and 2 x 30 sec EC  - pt reports he feels he is not WB as much on his L, added the following as a result  1/2 romberg on airex with LLE post 2 x 30 sec  - x 30 sec lateral head turns then 30 sec vertical head nods   Throughout session PT providedCGA for safety unless otherwise stated with tactile cues for improved postural control  and parascapular activation on the L side. Occasional cues for improved step length and height with weighted gait as listed.    PATIENT EDUCATION: Education details: Pt educated on examination findings and thorough demonstration/explanation of new HEP provided during session Person educated: Patient Education method: Explanation, Demonstration, and Handouts Education comprehension: verbalized understanding and returned demonstration  HOME EXERCISE PROGRAM: Access Code: [email or web address]  URL: [email or web address]  Date: 02/17/2024  Prepared by: Darryle Patten  Exercises: - Seated March  - 1 x daily - 3 x weekly - 3 sets - 10 reps  - Standing March with Counter Support  - 1 x daily - 3 x weekly - 3 sets - 10 reps  - Mini Squat with Counter Support  - 1 x daily - 3 x weekly - 3 sets - 10 reps  - Heel Toe Raises with Counter Support  - 1 x  daily - 3 x weekly - 3 sets - 10 reps   GOALS: Goals reviewed with patient? Yes  SHORT TERM GOALS: Target date: 03/09/2024   Patient will be independent in home exercise program to improve strength/mobility for better functional independence with ADLs. Baseline: HEP reviewed during eval Goal status: INITIAL   LONG TERM GOALS: Target date: 05/11/2024  Patient will increase their Stroke Impact Scale score by >10 points for improved function and independence Baseline: 45/80 Goal status: INITIAL  2.  Patient (< 51 years old) will complete five times sit to stand test in < 10 seconds indicating an increased LE strength and improved balance. Baseline: 17.9 seconds with UE support Goal status: INITIAL  3.  Patient will increase 10 meter walk test to >0.8 m/s as to improve gait speed for better community ambulation and to reduce fall risk. Baseline: 0.56 m/s with 4WW & 0.73 m/s with no AD  Goal status: INITIAL  4.  Patient will increase Berg Balance score by > 6 points to demonstrate decreased fall risk during functional activities. Baseline: 33/56 Goal status: INITIAL  5.  Patient will increase Functional Gait Assessment score to >20/30 as to reduce fall risk and improve dynamic gait safety with community ambulation. Baseline:18/30 on 02/25/24 Goal status: INITIAL  6.  Patient will increase six minute walk test distance by >50 ft for progression to community ambulator and improve gait ability Baseline: 750ft with 4WW on 02/25/24 Goal status: INITIAL  ASSESSMENT:  CLINICAL IMPRESSION:  Pt responded well to physical performance measures as well as dynamic balance/LE strengthening  and remained engaged and motivated throughout session. PT treatment focused on functional strengthening and improved gait mechanics to improve step height/length with increased neuromotor recruitment with weighted gait as well as dynamic balance control with more focus on the LLE with later interventions.  Pt  would benefit from skilled PT intervention to improve his balance, tolerance for ambulation, and overall functional status to progress toward returning to work and to regain independence for ease of ADLs.   OBJECTIVE IMPAIRMENTS: Abnormal gait, decreased activity tolerance, decreased balance, decreased coordination, decreased endurance, decreased mobility, difficulty walking, decreased ROM, decreased strength, hypomobility, impaired perceived functional ability, impaired flexibility, impaired UE functional use, improper body mechanics, and postural dysfunction.   ACTIVITY LIMITATIONS: carrying, lifting, bending, sitting, standing, squatting, stairs, transfers, bathing, reach over head, hygiene/grooming, and locomotion level  PARTICIPATION LIMITATIONS: meal prep, cleaning, laundry, driving, shopping, community activity, occupation, and yard work  PERSONAL FACTORS: Age and Fitness are also affecting patient's functional outcome.   REHAB POTENTIAL: Good  CLINICAL DECISION MAKING: Evolving/moderate complexity  EVALUATION  COMPLEXITY: Moderate  PLAN:  PT FREQUENCY: 2x/week  PT DURATION: 12 weeks  PLANNED INTERVENTIONS: 97164- PT Re-evaluation, 97750- Physical Performance Testing, 97110-Therapeutic exercises, 97530- Therapeutic activity, [id number]- Neuromuscular re-education, 97535- Self Care, 02859- Manual therapy, [id number]- Gait training, [id number]- Orthotic Initial, [phone]- Orthotic/Prosthetic subsequent, [phone]- Canalith repositioning, [id number]- Splinting, [id number]- Electrical stimulation (manual), [id number]- Ultrasound, [id number]- Traction (mechanical), [phone] (1-2 muscles), [id number] (3+ muscles)- Dry Needling, Patient/Family education, Balance training, Stair training, Taping, Joint mobilization, Spinal mobilization, Vestibular training, Visual/preceptual remediation/compensation, Cognitive remediation, DME instructions, Cryotherapy, and Moist heat  PLAN FOR NEXT SESSION:   Continue LE strengthening, progress dynamic  balance, weight shifting tasks to promote WB on L LE,  Dynamic gait training.   Note: Portions of this document were prepared using Dragon voice recognition software and although reviewed may contain unintentional dictation errors in syntax, grammar, or spelling.  Lonni KATHEE Gainer PT ,DPT Physical Therapist- [facility]  [facility]   10:42 AM 03/04/24

## 2024-03-04 NOTE — Telephone Encounter (Signed)
 Called patient back to let him know that the provider had filled out all that she needed to fill out.  However he has not filled his portion out, so in order for us  to fax it he is welcome to come pick it or finish filling it out here and then we can fax it.  He stated he would be up here today or tomorrow.

## 2024-03-05 NOTE — Telephone Encounter (Signed)
 Pt came in and completed his portion.  Will be faxed today

## 2024-03-05 NOTE — Telephone Encounter (Signed)
 Patient called to see if his paperwork was completed so he could come complete his part. Advised they are ready and he could come by fill out his part and we would fax.

## 2024-03-07 NOTE — Therapy (Signed)
 OUTPATIENT OCCUPATIONAL THERAPY NEURO TREATMENT NOTE  Patient Name: Mark Mcintosh MRN: [id number] DOB:08-May-1971, 53 y.o., male Today's Date: 03/07/2024  PCP: No PCP REFERRING PROVIDER: [name], PA-C  END OF SESSION:   OT End of Session - 03/04/24 0930      Visit Number 3     Number of Visits 24     Date for OT Re-Evaluation 05/11/24     OT Start Time 0930a    OT Stop Time 1015a    OT Time Calculation (min) 45 min     Activity Tolerance Patient tolerated treatment well     Behavior During Therapy [facility] for tasks assessed/performed         Past Medical History:  Diagnosis Date   Arthritis    Hypertension    Stroke (HCC) 01/09/2024   Stated he had 2 strokes.   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) 02/27/2024   Gait abnormality 02/27/2024   Left-sided weakness 02/27/2024   Prediabetes 02/27/2024   Abnormal pulse oximetry 02/27/2024   Vitamin D  deficiency 02/27/2024   Bradycardia 02/27/2024   Depression, major, single episode, moderate (HCC) 02/27/2024   Coping style affecting medical condition 01/20/2024   Right pontine cerebrovascular accident (HCC) 01/14/2024   Acute CVA (cerebrovascular accident) (HCC) 01/09/2024   Tobacco use disorder 01/09/2024   Obesity (BMI 30-39.9) 01/09/2024   Hypertension    ONSET DATE: 01/09/24  REFERRING DIAG: I63.9 (ICD-10-CM) - Acute CVA (cerebrovascular accident) (HCC)   THERAPY DIAG:  Muscle  weakness (generalized)  Other lack of coordination  Acute CVA (cerebrovascular accident) (HCC)  Rationale for Evaluation and Treatment: Rehabilitation  SUBJECTIVE:  SUBJECTIVE STATEMENT: Pt reports doing well today and consistently working with his theraputty at home. Pt accompanied by: self  PERTINENT HISTORY: Per chart:  Mark Mcintosh is a 53 y.o. right-handed male with history significant for bilateral conjunctival inflammation hypertension as well as tobacco use and class II morbid obesity with BMI 36.26.  Per chart review patient lives alone independent prior to admission.  Presented to [facility]/09/2024 with acute onset of left-sided weakness.  MRI showed acute infarct in the posterior limb of the right internal capsule, the overlying right frontal white matter and pons.  Multiple remote infarcts and chronic microvascular ischemic disease.  CTA showed no large vessel occlusion.  Admission chemistries unremarkable except potassium 3.1.  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  TEE with small PFO per cardiology services with no plan for closure.  Neurology follow-up placed on aspirin  and Plavix  for CVA prophylaxis x 3 weeks then aspirin  alone.  Lovenox  for DVT prophylaxis but bilateral Doppler studies negative.  Therapy evaluations completed due to patient decreased functional mobility left-sided weakness was admitted for a comprehensive rehab program.   PRECAUTIONS: Fall  WEIGHT BEARING RESTRICTIONS: No  PAIN: No pain, just numbness in the hand  Are you having pain? 0/10  FALLS: Has patient fallen in last 6 months? Yes. 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  Get back my independence.    OBJECTIVE:  Note: Objective measures were completed at Evaluation unless otherwise noted.  HAND DOMINANCE: Right  ADLs: Overall ADLs: Son was staying with pt for about a week after d/c from inpatient rehab, but has since returned home.  Friend comes by every other day to check in.   Transfers/ambulation related to ADLs: modified indep with rollator Eating: increased difficulty with cutting food  Grooming: increased difficulty with clipping nails, otherwise manages fine with dominant/unaffected hand UB Dressing: increased time with clothing fasteners LB Dressing: increased time with clothing fasteners  Toileting: increased time to engage L hand into clothing management Bathing: pt sits on shower chair to wash LEs, some SOB with standing in shower Tub Shower transfers: Modified indep Equipment: Shower seat without back  IADLs: Shopping: motorized cart, can push cart for shorter shopping trips.  Light housekeeping: extra time and cautions to avoid falls, sometimes feels SOB with laundry  Meal Prep: friend helps with stove top cooking, pt can do light hot and cold meal prep  Community mobility: rollator, friend drove pt  Medication management: indep  Landscape architect: indep  Handwriting: NT; L non-dominant hand affected   MOBILITY STATUS: Hx of falls, rollator or funiture/wall walking in the home, rollator for community  POSTURE COMMENTS:  L sided hemiparesis   ACTIVITY TOLERANCE: Activity tolerance: Pt reports some dyspnea with prolonged standing/mobility  UPPER EXTREMITY ROM:  BUEs WFL  UPPER EXTREMITY MMT:     MMT Right eval Left eval  Shoulder flexion 5 4-  Shoulder abduction 5 4-  Shoulder adduction    Shoulder extension    Shoulder internal rotation    Shoulder external rotation    Middle trapezius    Lower trapezius    Elbow flexion 5 4-  Elbow extension 5 4-  Wrist  flexion 5 4-  Wrist extension 5 4-  Wrist ulnar deviation    Wrist radial deviation    Wrist pronation 5 4-  Wrist supination 5 4-  (Blank rows = not tested)  HAND FUNCTION: Grip strength: Right: 111 lbs; Left: 51 lbs, Lateral pinch: Right: 23 lbs, Left: 13 lbs, and 3 point pinch: Right: 26 lbs, Left: 13 lbs  COORDINATION: Finger Nose Finger test: increased time and decreased accuracy on the L  9 Hole Peg test: Right: 25 sec; Left: 1 min 27 sec  SENSATION: Pt reports increased tingling/numbness when hand balls up a little, but when he straightens it out it goes back to normal.   EDEMA: no visible edema   MUSCLE TONE: LUE: Mild   COGNITION: Overall cognitive status: Within functional limits for tasks assessed pt reportme difficulties with memory   VISION:             Subjective report: Wears glasses all the time.  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                                                                                                             TREATMENT DATE: 03/04/24 Neuro re-ed: -Facilitated L hand [facility] skills using L hand for large peg set up for task noted below.  Occasional dropping of pegs, requiring vc to attempt grasping 1 peg at a time from container in prep for placing into pegboard; 2 trials.  Therapeutic Activity: -Facilitated L hand sustained grasping/releasing, and reaching toward a target using hand gripper to remove jumbo pegs from pegboard and return to container for 2 trials; hand gripper set at 11.2#    Therapeutic Exercise: -Issued red theraband and instructed pt in LUE strengthening exercises, including bilat shoulder horiz abd, L shoulder flexion, abd, elbow flex, and elbow ext.  Pt completed 2 sets 10-12 reps each, requiring mod vc for form/technique to improve quality of movement.  Visual handout issued and reviewed with pt.  Further training needed.  PATIENT EDUCATION: Education details: HEP progression Person educated: Patient Education method: Explanation and Verbal cues, visual handout Education comprehension: verbalized understanding  HOME EXERCISE PROGRAM: Mark Mcintosh, Mark Mcintosh handout  GOALS: Goals reviewed with patient? Yes  SHORT TERM GOALS: Target date: 03/30/24  Pt will be indep to perform HEP for increasing strength and coordination throughout the LUE. Baseline: Eval: Not yet initiated Goal status: INITIAL  LONG TERM GOALS: Target date: 05/11/24  Pt will increase LUE MMT by 1/2 grade or more to increase engagement of LUE into ADL/IADLs. Baseline: Eval: LUE grossly 4-/5 throughout (R 5/5) Goal status: INITIAL  2.  Pt will increase L grip strength by 20 lbs or more to enable pt to grasp and carry heavy ADL supplies in L hand. Baseline: Eval: L 51 lbs (R 111) Goal status: INITIAL  3.  Pt will increase L hand [facility] skills as demonstrated by completion of 9 hole peg test in <1 min to improve manipulation of small ADL supplies. Baseline: Eval: L 1 min 27 sec (R 25 sec) Goal status: INITIAL  4.  Pt will increase LUE GMC to enable confidence with moving hot pots/pans on/off stove top and in/out of the oven using BUEs. Baseline: Eval: Pt reports the his friend currently assists with heavier meal prep Goal status: INITIAL  ASSESSMENT:  CLINICAL IMPRESSION: Pt with good tolerance to exercises and activities this date.  Pt reports good adherence to use of theraputty at home.  Pt continues to demonstrate mild-moderate ataxia with FMC/GMC movements, requiring vc to slow  pace of placing pegs and use of hand gripper to increase control with desired movements.  Green theraband attempted today, but downgraded to red band to improve movement quality/form.  Visual handout issued for theraband to improve carry over at home.  Pt demonstrate good understanding of handout but further training needed to maximize form/technique and reduce substitution patterns.  Pt will benefit from continued skilled OT to  work towards above noted goals for improving indep with daily tasks, return to work, reduce burden of care on caregivers, and improve QOL.    PERFORMANCE DEFICITS: in functional skills including ADLs, IADLs, coordination, dexterity, sensation, tone, strength, Fine motor control, Gross motor control, mobility, balance, body mechanics, endurance, decreased knowledge of precautions, decreased knowledge of use of DME, vision, and UE functional use, cognitive skills including memory, and psychosocial skills including coping strategies, environmental adaptation, and routines and behaviors.   IMPAIRMENTS: are limiting patient from ADLs, IADLs, work, and leisure.   CO-MORBIDITIES: has co-morbidities such as tobacco use disorder, HTN, arthritis that affects occupational performance. Patient will benefit from skilled OT to address above impairments and improve overall function.  MODIFICATION OR ASSISTANCE TO COMPLETE EVALUATION: No modification of tasks or assist necessary to complete an evaluation.  OT OCCUPATIONAL PROFILE AND HISTORY: Detailed assessment: Review of records and additional review of physical, cognitive, psychosocial history related to current functional performance.  CLINICAL DECISION MAKING: Moderate - several treatment options, min-mod task modification necessary  REHAB POTENTIAL: Good  EVALUATION COMPLEXITY: Moderate    PLAN:  OT FREQUENCY: 2x/week  OT DURATION: 12 weeks  PLANNED INTERVENTIONS: 97168 OT Re-evaluation, 97535 self care/ADL training, 02889  therapeutic exercise, 97530 therapeutic activity, 97112 neuromuscular re-education, 97140 manual therapy, 97116 gait training, 02989 moist heat, 97010 cryotherapy, 97750 Physical Performance Testing, passive range of motion, balance training, functional mobility training, visual/perceptual remediation/compensation, psychosocial skills training, energy conservation, coping strategies training, patient/family education, and DME and/or AE instructions  RECOMMENDED OTHER SERVICES: SLP eval and treat d/t pt reporting memory deficits since CVA, drooling, and slurred speech  CONSULTED AND AGREED WITH PLAN OF CARE: Patient  PLAN FOR NEXT SESSION: see above  Inocente Blazing, MS, OTR/L  Inocente MARLA Blazing, OT 03/07/2024, 1:44 PM

## 2024-03-08 ENCOUNTER — Ambulatory Visit

## 2024-03-08 ENCOUNTER — Encounter: Admitting: Speech Pathology

## 2024-03-08 ENCOUNTER — Ambulatory Visit: Admitting: Physical Therapy

## 2024-03-08 DIAGNOSIS — R2689 Other abnormalities of gait and mobility: Secondary | ICD-10-CM

## 2024-03-08 DIAGNOSIS — I639 Cerebral infarction, unspecified: Secondary | ICD-10-CM

## 2024-03-08 DIAGNOSIS — R278 Other lack of coordination: Secondary | ICD-10-CM

## 2024-03-08 DIAGNOSIS — R262 Difficulty in walking, not elsewhere classified: Secondary | ICD-10-CM

## 2024-03-08 DIAGNOSIS — R2681 Unsteadiness on feet: Secondary | ICD-10-CM | POA: Diagnosis not present

## 2024-03-08 DIAGNOSIS — M6281 Muscle weakness (generalized): Secondary | ICD-10-CM

## 2024-03-08 NOTE — Telephone Encounter (Signed)
 Called pt to let him know that his paperwork was faxed on Friday 03/05/2024.

## 2024-03-08 NOTE — Therapy (Signed)
 OUTPATIENT OCCUPATIONAL THERAPY NEURO TREATMENT NOTE  Patient Name: Mark Mcintosh MRN: [id number] DOB:1970-09-26, 53 y.o., male Today's Date: 03/08/2024  PCP: No PCP REFERRING PROVIDER: [name], PA-C  END OF SESSION:   OT End of Session - 03/08/24 0818     Visit Number 4    Number of Visits 24    Date for OT Re-Evaluation 05/11/24    OT Start Time 0801    OT Stop Time 0845    OT Time Calculation (min) 44 min    Activity Tolerance Patient tolerated treatment well    Behavior During Therapy [facility] for tasks assessed/performed             Past Medical History:  Diagnosis Date   Arthritis    Hypertension    Stroke (HCC) 01/09/2024   Stated he had 2 strokes.   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) 02/27/2024   Gait abnormality 02/27/2024   Left-sided weakness 02/27/2024   Prediabetes 02/27/2024   Abnormal pulse oximetry 02/27/2024   Vitamin D  deficiency 02/27/2024   Bradycardia 02/27/2024   Depression, major, single episode, moderate (HCC) 02/27/2024   Coping style affecting medical condition 01/20/2024   Right pontine cerebrovascular accident (HCC) 01/14/2024   Acute CVA (cerebrovascular accident) (HCC) 01/09/2024   Tobacco use disorder 01/09/2024   Obesity (BMI 30-39.9) 01/09/2024   Hypertension    ONSET DATE: 01/09/24  REFERRING DIAG: I63.9 (ICD-10-CM) - Acute CVA (cerebrovascular accident) (HCC)   THERAPY DIAG:  Muscle  weakness (generalized)  Other lack of coordination  Acute CVA (cerebrovascular accident) (HCC)  Rationale for Evaluation and Treatment: Rehabilitation  SUBJECTIVE:  SUBJECTIVE STATEMENT: Pt reports having a busy week this week with an US  tomorrow for a lump near his R breast, therapy appts, and an eye appt on Friday.  Pt accompanied by: self  PERTINENT HISTORY: Per chart:  Mark Mcintosh is a 53 y.o. right-handed male with history significant for bilateral conjunctival inflammation hypertension as well as tobacco use and class II morbid obesity with BMI 36.26.  Per chart review patient lives alone independent prior to admission.  Presented to [facility] 01/09/2024 with acute onset of left-sided weakness.  MRI showed acute infarct in the posterior limb of the right internal capsule, the overlying right frontal white matter and pons.  Multiple remote infarcts and chronic microvascular ischemic disease.  CTA showed no large vessel occlusion.  Admission chemistries unremarkable except potassium 3.1.  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  TEE with small PFO per cardiology services with no plan for closure.  Neurology follow-up placed on aspirin  and Plavix  for CVA prophylaxis x 3 weeks then aspirin  alone.  Lovenox  for DVT prophylaxis but bilateral Doppler studies negative.  Therapy evaluations completed due to patient decreased functional mobility left-sided weakness was admitted for a comprehensive rehab program.   PRECAUTIONS: Fall  WEIGHT BEARING RESTRICTIONS: No  PAIN: No pain, just numbness in the hand  Are you having pain? 0/10  FALLS: Has patient fallen in last 6 months? Yes.  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  Get back my independence.    OBJECTIVE:  Note: Objective measures were completed at Evaluation unless otherwise noted.  HAND DOMINANCE: Right  ADLs: Overall ADLs: Son was staying with pt for about a week after d/c from inpatient rehab, but has since returned home.  Friend comes by every other day to check in.   Transfers/ambulation related to ADLs: modified indep with rollator Eating: increased difficulty with cutting food  Grooming: increased difficulty with clipping nails, otherwise manages fine with dominant/unaffected hand UB Dressing: increased time with clothing fasteners LB Dressing: increased time with clothing fasteners  Toileting: increased time to engage L hand into clothing management Bathing: pt sits on shower chair to wash LEs, some SOB with standing in shower Tub Shower transfers: Modified indep Equipment: Shower seat without back  IADLs: Shopping: motorized cart, can push cart for shorter shopping trips.  Light housekeeping: extra time and cautions to avoid falls, sometimes feels SOB with laundry  Meal Prep: friend helps with stove top cooking, pt can do light hot and cold meal prep  Community mobility: rollator, friend drove pt  Medication management: indep  Landscape architect: indep  Handwriting: NT; L non-dominant hand affected   MOBILITY STATUS: Hx of falls, rollator or funiture/wall walking in the home, rollator for community  POSTURE COMMENTS:  L sided hemiparesis   ACTIVITY TOLERANCE: Activity tolerance: Pt reports some dyspnea with prolonged standing/mobility  UPPER EXTREMITY ROM:  BUEs WFL  UPPER EXTREMITY MMT:     MMT Right eval Left eval  Shoulder flexion 5 4-  Shoulder abduction 5 4-  Shoulder adduction    Shoulder extension    Shoulder internal rotation    Shoulder external rotation    Middle trapezius    Lower trapezius     Elbow flexion 5 4-  Elbow extension 5 4-  Wrist flexion 5 4-  Wrist extension 5 4-  Wrist ulnar deviation    Wrist radial deviation    Wrist pronation 5 4-  Wrist supination 5 4-  (Blank rows = not tested)  HAND FUNCTION: Grip strength: Right: 111 lbs; Left: 51 lbs, Lateral pinch: Right: 23 lbs, Left: 13 lbs, and 3 point pinch: Right: 26 lbs, Left: 13 lbs  COORDINATION: Finger Nose Finger test: increased time and decreased accuracy on the L  9 Hole Peg test: Right: 25 sec; Left: 1 min 27 sec  SENSATION: Pt reports increased tingling/numbness when hand balls up a little, but when he straightens it out it goes back to normal.   EDEMA: no visible edema   MUSCLE TONE: LUE: Mild   COGNITION: Overall cognitive status: Within functional limits for tasks assessed pt reportme difficulties with memory   VISION:             Subjective report: Wears glasses all the time.  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                                                                                                             TREATMENT DATE: 03/08/24 Therapeutic Exercise: -Reviewed red theraband exercises from last session; completed LUE strengthening exercises, including bilat shoulder horiz abd, L shoulder flexion, abd, elbow flex, and elbow ext for 1 set 10 reps.  Pt requiring min vc for form/technique to improve quality of movement and to adjust band tension between exercises.    Therapeutic Activity: -Participation in activities to promote LUE GMC/FMC  skills. Roselyn Board pegs/board: pegboard placed on incline to further challenge reaching.  Pt placed pegs dot side down along perimeter of Judy board, then rotated pegs 180 degrees to dot side down.  Min vc to rotate pegs within fingertips rather than pronating/supinating forearm.  Pt practiced item storage and translatory skills working to remove pegs 1 by 1 and store up to 3 in hand, discarding 1 by 1 from palm of hand.   -Manipulation skills with poker chips and dice: Pt practiced forward/lateral reaching patterns with manipulation skills, working to place poker chips through slotted container, stacking poker chips, and working on Comptroller and item storage with 3 dice in hand.    PATIENT EDUCATION: Education details: L hand FMC/dexterity skills Person educated: Patient Education method: Explanation and Verbal cues Education comprehension: verbalized understanding  HOME EXERCISE PROGRAM: Landy fake, [facility] handout  GOALS: Goals reviewed with patient? Yes  SHORT TERM GOALS: Target date: 03/30/24  Pt will be indep to perform HEP for increasing strength and coordination throughout the LUE. Baseline: Eval: Not yet initiated Goal status: INITIAL  LONG TERM GOALS: Target date: 05/11/24  Pt will increase LUE MMT by 1/2 grade or more to increase engagement of LUE into ADL/IADLs. Baseline: Eval: LUE grossly 4-/5 throughout (R 5/5) Goal status: INITIAL  2.  Pt will increase L grip strength by 20 lbs or more to enable pt to grasp and carry heavy ADL supplies in L hand. Baseline: Eval: L 51 lbs (R 111) Goal status: INITIAL  3.  Pt will increase L hand [facility] skills as demonstrated by completion of 9 hole peg test in <1 min to improve manipulation of small ADL supplies. Baseline: Eval: L 1 min 27 sec (R 25 sec) Goal status: INITIAL  4.  Pt will increase LUE GMC to enable confidence with moving hot pots/pans on/off stove top and in/out of the oven using BUEs. Baseline: Eval: Pt  reports the his friend currently assists with heavier meal prep Goal status: INITIAL  ASSESSMENT:  CLINICAL IMPRESSION: Pt with good tolerance to exercises and activities this date.  Pt required fewer vc for form/technique with red theraband exercises this date.  Pt continues to present with moderate ataxia with GM/FM movements in the LUE, occasionally dropping pegs with [employer] activity, but  good ability to pick up dropped pegs from floor using LUE and extra time.  Pt acknowledges improvements in use of LUE with daily tasks, noting improved ability to apply deodorant to R axilla using L hand.  Pt also reports that he routinely makes an effort to pick up dropped ADL supplies with the L hand.  Pt will continue to benefit from continued skilled OT to work towards above noted goals for improving indep with daily tasks, return to work, reduce burden of care on caregivers, and improve QOL.    PERFORMANCE DEFICITS: in functional skills including ADLs, IADLs, coordination, dexterity, sensation, tone, strength, Fine motor control, Gross motor control, mobility, balance, body mechanics, endurance, decreased knowledge of precautions, decreased knowledge of use of DME, vision, and UE functional use, cognitive skills including memory, and psychosocial skills including coping strategies, environmental adaptation, and routines and behaviors.   IMPAIRMENTS: are limiting patient from ADLs, IADLs, work, and leisure.   CO-MORBIDITIES: has co-morbidities such as tobacco use disorder, HTN, arthritis that affects occupational performance. Patient will benefit from skilled OT to address above impairments and improve overall function.  MODIFICATION OR ASSISTANCE TO COMPLETE EVALUATION: No modification of tasks or assist necessary to complete an evaluation.  OT OCCUPATIONAL PROFILE AND HISTORY: Detailed assessment: Review of records and additional review of physical, cognitive, psychosocial history related to current  functional performance.  CLINICAL DECISION MAKING: Moderate - several treatment options, min-mod task modification necessary  REHAB POTENTIAL: Good  EVALUATION COMPLEXITY: Moderate    PLAN:  OT FREQUENCY: 2x/week  OT DURATION: 12 weeks  PLANNED INTERVENTIONS: 97168 OT Re-evaluation, 97535 self care/ADL training, 02889 therapeutic exercise, 97530 therapeutic activity, 97112 neuromuscular re-education, 97140 manual therapy, 97116 gait training, 02989 moist heat, 97010 cryotherapy, 97750 Physical Performance Testing, passive range of motion, balance training, functional mobility training, visual/perceptual remediation/compensation, psychosocial skills training, energy conservation, coping strategies training, patient/family education, and DME and/or AE instructions  RECOMMENDED OTHER SERVICES: SLP eval and treat d/t pt reporting memory deficits since CVA, drooling, and slurred speech  CONSULTED AND AGREED WITH PLAN OF CARE: Patient  PLAN FOR NEXT SESSION: see above  Inocente Blazing, MS, OTR/L  Inocente MARLA Blazing, OT 03/08/2024, 8:23 AM

## 2024-03-08 NOTE — Therapy (Signed)
 OUTPATIENT PHYSICAL THERAPY NEURO TREATMENT   Patient Name: Mark Mcintosh MRN: [id number] DOB:[birth year], 53 y.o., male Today's Date: 03/08/2024   PCP: None REFERRING PROVIDER: [name], PA-C  END OF SESSION:  PT End of Session - 03/08/24 0852     Visit Number 5    Number of Visits 25    Date for PT Re-Evaluation 05/11/24    Progress Note Due on Visit 10    PT Start Time 0850    PT Stop Time 0930    PT Time Calculation (min) 40 min    Equipment Utilized During Treatment Gait belt    Activity Tolerance Patient tolerated treatment well    Behavior During Therapy [name] for tasks assessed/performed            Past Medical History:  Diagnosis Date   Arthritis    Hypertension    Stroke (HCC) 01/09/2024   Stated he had 2 strokes.   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) 02/27/2024   Gait abnormality 02/27/2024   Left-sided weakness 02/27/2024   Prediabetes 02/27/2024   Abnormal pulse oximetry 02/27/2024   Vitamin D  deficiency 02/27/2024   Bradycardia 02/27/2024   Depression, major, single episode, moderate (HCC) 02/27/2024   Coping style affecting medical condition 01/20/2024   Right pontine cerebrovascular accident (HCC) 01/14/2024   Acute CVA (cerebrovascular accident) (HCC) 01/09/2024   Tobacco use disorder 01/09/2024   Obesity (BMI 30-39.9) 01/09/2024   Hypertension     ONSET DATE: 01/09/24  REFERRING DIAG: I63.9  (ICD-10-CM) - Acute CVA (cerebrovascular accident) (HCC)   THERAPY DIAG:  Muscle weakness (generalized)  Other lack of coordination  Difficulty in walking, not elsewhere classified  Other abnormalities of gait and mobility  Unsteadiness on feet  Rationale for Evaluation and Treatment: Rehabilitation  SUBJECTIVE:                                                                                                                                                                                             SUBJECTIVE STATEMENT: Pt reports doing well today. Pt denies any recent falls/stumbles since prior session. Pt denies any updates to medications  or medical appointment since prior session. Pt reports good compliance with HEP when time permits.    Pt accompanied by: self  PERTINENT HISTORY: Pt admitted to [facility] ED on 01/09/24 for progressively worsening L sided-weakness. Per ED provider note:  Patient presents emergency department for left-sided weakness starting yesterday afternoon, continued today.  Seems to be somewhat intermittent.  Patient's lab work today shows a reassuring CBC with a reassuring chemistry.  Patient's ethanol is negative.  INR is normal.  However patient's examination is concerning given his left upper extremity weakness pronator drift and mild left facial droop.  Will obtain an MRI of the brain to further evaluate.  Patient agreeable to plan of care.   Patient's MRI unfortunately shows multiple acute infarcts concerning for embolic showering.  Will dose aspirin .  Will admit to the hospital service for further workup and treatment.  Symptoms started yesterday (approximately 30 hours ago) patient is well outside of any therapeutic window.  Pt underwent PT in Acute Care and was admitted to [facility] from 5/29-6/13.  PMH includes arthritis, HTN  PAIN:  Are you having pain? No  PRECAUTIONS: None  RED FLAGS: None   WEIGHT BEARING RESTRICTIONS: No  FALLS: Has patient fallen  in last 6 months? Yes. Number of falls 1- pt reports that he tried to get up at home after returning from [facility] and he got up too fast and fell. 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  DIAGNOSTIC FINDINGS: via chart  From 01/09/24 MRI Brain W/O Contrast: IMPRESSION: 1. Acute infarcts in the posterior limb of the right internal capsule, the overlying right frontal white matter, and pons. Given involvement of multiple vascular territories, consider an embolic etiology. 2. Multiple remote infarcts and chronic microvascular ischemic disease, detailed above.  From 01/10/24 CT Angio Head Neck W W/O CM: IMPRESSION: 1. No acute intracranial hemorrhage or mass effect. 2. No large vessel occlusion, hemodynamically significant stenosis, or aneurysm in the head or neck. 3. Remote lacunar infarcts in the genu of the left internal capsule, globus pallidus, and right corona radiata. 4. Acute infarct involving the posterior limb of the right internal capsule is less well appreciated at CT. 5. Moderately advanced periventricular white matter disease for age.  COGNITION: Overall cognitive status: Within functional limits for tasks assessed   SENSATION: WFL  COORDINATION: Heel to shin: Slower on L, WFL on R Finger to nose: Slower on L with overshooting on first 2 reps, WFL on R   POSTURE: rounded shoulders, forward head, and weight shift right   LOWER  EXTREMITY MMT:    MMT Right Eval Left Eval  Hip flexion 4- 3+  Hip extension    Hip abduction 5 5  Hip adduction 4+ 4+  Hip internal rotation    Hip external rotation     Knee flexion 5 4+  Knee extension 5 4+  Ankle dorsiflexion 5 4-  Ankle plantarflexion 5 4-  Ankle inversion  Ankle eversion    (Blank rows = not tested)  BED MOBILITY:  Not tested- pt reports being IND  TRANSFERS: Sit to stand: Modified independence  Assistive device utilized: None     Stand to sit: Modified independence  Assistive device utilized: None     Chair to chair: SBA  Assistive device utilized: None        STAIRS: Findings: Level of Assistance: CGA, Stair Negotiation Technique: Alternating Pattern  with Bilateral Rails, Number of Stairs: 4, Height of Stairs: 6   , and Comments: Pt with decreased foot clearance bilaterally L>R , heavy UE support when descending GAIT: Findings: Gait Characteristics: step through pattern, decreased arm swing- Left, decreased step length- Left, decreased stance time- Left, decreased ankle dorsiflexion- Left, lateral lean- Left, wide BOS, poor foot clearance- Right, and poor foot clearance- Left, Distance walked: approx 50 feet, Assistive device utilized:None, Level of assistance: CGA, and Comments: Ambulation for and into/out of clinic  FUNCTIONAL TESTS:  5 times sit-to-stand: 17.9 seconds, pt utilizing arms on knees to assist with standing  : 0.56 m/s with 4WW and CGA; 0.73 m/s with no AD and CGA  BERG Balance Scale: 33/56  FGA: 18/30 on 02/25/24  : 795 ft with 4WW on 02/25/24  PATIENT SURVEYS:  Stroke Impact Scale: 45/80                                                                                                                              TREATMENT DATE: 03/08/24    TA - To improve functional movements patterns for everyday tasks and community mobility   Gait with rollator in rehab unit  x 170ft.  Gait wit SPC through rehab unit x  336ft.  Dynamic gait with SPC in simulated community environment to navigate hall of hospital, cafeteria, cement sidewalk and ram to and from healing garden at [employer]. Performed 4 bouts 500-928ft ft per bout with cues for intermittent improved step height on the LLE as well as improved sequencing initially for [facility] use.   TE At rail: Standing ankle DF/PF with 3# AW x 12  HS curl x 12 bil with 3# AW  Standing hip Abduction x 12 bil with 3# AW.  Cues for decreased speed of eccentric movement.    Throughout session PT providedCGA for safety unless otherwise stated with tactile cues for improved postural control and parascapular activation on the L side. Occasional cues for improved step length and height with weighted gait as listed.    PATIENT EDUCATION: Education details: Pt educated on examination findings and thorough demonstration/explanation of new HEP provided during session  Encouraged to utilize [facility] for community mobility, but decrease distance initially to ensure safety given increased physical and mental demand of gait with SPC vs rollator.   Person educated: Patient Education method: Explanation, Demonstration, and Handouts Education comprehension: verbalized understanding and returned demonstration  HOME EXERCISE PROGRAM: Access Code: [email or web address]  URL: [email or web address]  Date: 02/17/2024  Prepared by: Darryle Patten  Exercises: - Seated March  - 1 x daily - 3 x weekly - 3 sets - 10 reps  - Standing March with Counter Support  - 1 x daily - 3 x weekly - 3 sets - 10 reps  - Mini Squat with Counter Support  - 1 x daily - 3 x weekly - 3 sets - 10 reps  - Heel Toe Raises with Counter Support  - 1 x daily - 3 x weekly - 3 sets - 10 reps   GOALS: Goals reviewed with patient? Yes  SHORT TERM GOALS: Target date: 03/09/2024   Patient will be independent in home exercise program to improve strength/mobility for better functional independence with  ADLs. Baseline: HEP reviewed during eval Goal status: INITIAL   LONG TERM GOALS: Target date: 05/11/2024  Patient will increase their Stroke Impact Scale score by >10 points for improved function and independence Baseline: 45/80 Goal status: INITIAL  2.  Patient (< 67 years old) will complete five times sit to stand test in < 10 seconds indicating an increased LE strength and improved balance. Baseline: 17.9 seconds with UE support Goal status: INITIAL  3.  Patient will increase 10 meter walk test to >0.8 m/s as to improve gait speed for better community ambulation and to reduce fall risk. Baseline: 0.56 m/s with 4WW & 0.73 m/s with no AD  Goal status: INITIAL  4.  Patient will increase Berg Balance score by > 6 points to demonstrate decreased fall risk during functional activities. Baseline: 33/56 Goal status: INITIAL  5.  Patient will increase Functional Gait Assessment score to >20/30 as to reduce fall risk and improve dynamic gait safety with community ambulation. Baseline:18/30 on 02/25/24 Goal status: INITIAL  6.  Patient will increase six minute walk test distance by >50 ft for progression to community ambulator and improve gait ability Baseline: 747ft with 4WW on 02/25/24 Goal status: INITIAL  ASSESSMENT:  CLINICAL IMPRESSION:  Pt responded well to physical Therapy treatment focused on improved safety with daily tasks and and community mobility. PT tolerated community mobility  with SPC >517ft for multiple bouts; only occasional instruction for uphill navigation to reduce foot drag on the RLE as sequencing of SPC use initially.  Pt then tolerated BLE strengthening interventions well but reported increased muscle soreness upon completion. Pt would benefit from skilled PT intervention to improve his balance, tolerance for ambulation, and overall functional status to progress toward returning to work and to regain independence for ease of ADLs.   OBJECTIVE IMPAIRMENTS: Abnormal  gait, decreased activity tolerance, decreased balance, decreased coordination, decreased endurance, decreased mobility, difficulty walking, decreased ROM, decreased strength, hypomobility, impaired perceived functional ability, impaired flexibility, impaired UE functional use, improper body mechanics, and postural dysfunction.   ACTIVITY LIMITATIONS: carrying, lifting, bending, sitting, standing, squatting, stairs, transfers, bathing, reach over head, hygiene/grooming, and locomotion level  PARTICIPATION LIMITATIONS: meal prep, cleaning, laundry, driving, shopping, community activity, occupation, and yard work  PERSONAL FACTORS: Age and Fitness are also affecting patient's functional outcome.   REHAB POTENTIAL: Good  CLINICAL DECISION MAKING: Evolving/moderate complexity  EVALUATION COMPLEXITY: Moderate  PLAN:  PT FREQUENCY: 2x/week  PT DURATION: 12 weeks  PLANNED INTERVENTIONS: 97164- PT Re-evaluation, 97750- Physical Performance Testing, 97110-Therapeutic exercises, 97530- Therapeutic activity, [id number]- Neuromuscular re-education, 97535- Self Care, [id number]- Manual therapy, [id number]- Gait training, [id number]- Orthotic Initial, [id number]- Orthotic/Prosthetic subsequent, [id number]- Canalith repositioning, [id number]- Splinting, [id number]- Electrical stimulation (manual), [id number]- Ultrasound, [id number]- Traction (mechanical), [id number] (1-2 muscles), 20561 (3+ muscles)-  Dry Needling, Patient/Family education, Balance training, Stair training, Taping, Joint mobilization, Spinal mobilization, Vestibular training, Visual/preceptual remediation/compensation, Cognitive remediation, DME instructions, Cryotherapy, and Moist heat  PLAN FOR NEXT SESSION:   Continue LE strengthening, progress dynamic balance, weight shifting tasks to promote WB on L LE,  Dynamic gait training.   Massie FORBES Dollar PT ,DPT Physical Therapist- [place]  [facility]   9:19 AM 03/08/24

## 2024-03-08 NOTE — Telephone Encounter (Signed)
 Copied from CRM [id number]. Topic: General - Other >> Mar 08, 2024 12:07 PM Mark Mcintosh wrote: Reason for CRM: Pt states that he was there on Friday and he needed a form to be faxed when completed. He called to check and see if it was, please advise.

## 2024-03-09 ENCOUNTER — Ambulatory Visit
Admission: RE | Admit: 2024-03-09 | Discharge: 2024-03-09 | Disposition: A | Discharge status: Home / Self Care | Source: Ambulatory Visit | Attending: Family Medicine | Admitting: Family Medicine

## 2024-03-09 ENCOUNTER — Other Ambulatory Visit: Payer: Self-pay | Admitting: Family Medicine

## 2024-03-09 ENCOUNTER — Telehealth: Payer: Self-pay

## 2024-03-09 DIAGNOSIS — N63 Unspecified lump in unspecified breast: Secondary | ICD-10-CM

## 2024-03-09 DIAGNOSIS — N6315 Unspecified lump in the right breast, overlapping quadrants: Secondary | ICD-10-CM

## 2024-03-09 DIAGNOSIS — R928 Other abnormal and inconclusive findings on diagnostic imaging of breast: Secondary | ICD-10-CM

## 2024-03-09 NOTE — Telephone Encounter (Signed)
 Copied from CRM [id number]. Topic: Medical Record Request - Other >> Mar 09, 2024  2:43 PM Chasity T wrote: Reason for CRM: Patient is calling in because when speaking with the disability office they are now wanting the most recent summary progress reports and notes to be faxed over to them.

## 2024-03-10 ENCOUNTER — Ambulatory Visit: Payer: Self-pay | Admitting: Family Medicine

## 2024-03-10 ENCOUNTER — Ambulatory Visit: Admitting: Physical Therapy

## 2024-03-10 ENCOUNTER — Encounter: Admitting: Occupational Therapy

## 2024-03-11 ENCOUNTER — Ambulatory Visit

## 2024-03-11 ENCOUNTER — Ambulatory Visit: Admitting: Speech Pathology

## 2024-03-11 DIAGNOSIS — R262 Difficulty in walking, not elsewhere classified: Secondary | ICD-10-CM

## 2024-03-11 DIAGNOSIS — R471 Dysarthria and anarthria: Secondary | ICD-10-CM

## 2024-03-11 DIAGNOSIS — R278 Other lack of coordination: Secondary | ICD-10-CM

## 2024-03-11 DIAGNOSIS — R1312 Dysphagia, oropharyngeal phase: Secondary | ICD-10-CM

## 2024-03-11 DIAGNOSIS — R2681 Unsteadiness on feet: Secondary | ICD-10-CM | POA: Diagnosis not present

## 2024-03-11 DIAGNOSIS — M6281 Muscle weakness (generalized): Secondary | ICD-10-CM

## 2024-03-11 DIAGNOSIS — I639 Cerebral infarction, unspecified: Secondary | ICD-10-CM

## 2024-03-11 NOTE — Therapy (Signed)
 OUTPATIENT SPEECH LANGUAGE PATHOLOGY  SWALLOW and DYSARTHRIA TREATMENT NOTE   Patient Name: Mark Mcintosh MRN: [id number] DOB:1971/03/30, 53 y.o., male Today's Date: 03/11/2024  PCP: Curtis Boom, FNP REFERRING PROVIDER: Fidela Ned, NP   End of Session - 03/11/24 0757     Visit Number 3    Number of Visits 17    Date for SLP Re-Evaluation 04/27/24    Authorization Type [employer]    Progress Note Due on Visit 10    SLP Start Time 0804    SLP Stop Time  0845    SLP Time Calculation (min) 41 min    Activity Tolerance Patient tolerated treatment well          Past Medical History:  Diagnosis Date   Arthritis    Hypertension    Stroke (HCC) 01/09/2024   Stated he had 2 strokes.   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) 02/27/2024   Gait abnormality 02/27/2024   Left-sided weakness 02/27/2024   Prediabetes 02/27/2024   Abnormal pulse oximetry 02/27/2024   Vitamin D  deficiency 02/27/2024   Bradycardia 02/27/2024   Depression, major, single episode, moderate (HCC) 02/27/2024   Coping style affecting medical condition 01/20/2024   Right pontine cerebrovascular accident (HCC) 01/14/2024   Acute CVA (cerebrovascular accident) (HCC) 01/09/2024   Tobacco use disorder 01/09/2024   Obesity (BMI 30-39.9) 01/09/2024   Hypertension     ONSET DATE: 01/09/2024   REFERRING DIAG:  R47.1 (ICD-10-CM) - Dysarthria  R13.10 (ICD-10-CM)  - Dysphagia    THERAPY DIAG:  Dysarthria and anarthria  Dysphagia, oropharyngeal phase  Rationale for Evaluation and Treatment Rehabilitation  SUBJECTIVE:   PERTINENT HISTORY and DIAGNOSTIC FINDINGS: Pt is a right handed 53 year old male with past medical history of hypertension and tobacco use disorder. He presented to [facility] ED on 01/09/2024 with left sided weakness. MRI on 01/10/2024 revealed 1. Acute infarcts in the posterior limb of the right internal  capsule, the overlying right frontal white matter, and pons. Given  involvement of multiple vascular territories, consider an embolic  etiology.  2. Multiple remote infarcts and chronic microvascular ischemic  disease. Pt received ST services at [facility] from 01/16/2024 thru 01/29/2024.   PAIN:  Are you having pain? No  FALLS: Has patient fallen in last 6 months?  No  LIVING ENVIRONMENT: Lives with: lives alone Lives in: House/apartment  PLOF:  Level of assistance: Independent with ADLs, Independent with IADLs Employment: Full-time employment  SUBJECTIVE STATEMENT: Pt pleasant, reports continued improvement/progress Pt accompanied by: self  PATIENT GOALS  to improve speech and swallow abilities  OBJECTIVE:   TODAY'S TREATMENT NOTE: Skilled treatment session targeted pt's dysarthria and dysphagia goals. SLP facilitated session by providing the following interventions:  Pt continues to consume thin liquids via bottle top without any overt s/s of aspiration or oral phase dysphagia.   Pt brought his EMST device -able to 3 sets of 10 repetitions at one turn beyond 60 cmH2O with self-perceived effort level of 7 out  of 10.   Pt with improved anterior tongue tip placement but improved with additional trials. Reports that he has been practicing in front of a mirror.   Pt was Mod I for reading at the sentence level and during simple conversation level speech to achieve > 95% speech intelligibility .   PATIENT EDUCATION: Education  details: see above Person educated: Patient Education method: Explanation and written cues Education comprehension: verbalized understanding and needs further education  HOME EXERCISE PROGRAM:     Aspiration Precautions  Compensatory Swallow strategies  Complete EMST   GOALS: Goals reviewed with patient? Yes  SHORT TERM GOALS: Target date: 10 sessions  With Mod I, pt will keep food and liquid in mouth while eating without losing the bolus out of the front of the mouth to safely consume least restrictive diet. Baseline: Goal status: INITIAL  2.  With Mod I, pt will complete lingual strengthening exercises (anterior and posterior) x 15 times in order to increase muscle strength to reduce oral residue given regular textures.  Baseline:  Goal status: INITIAL  3.  With Mod I, patient will complete 3 sets of 10 repetitions with EMST set at 100cmH2O with self-reported effortful of 7 out of 10. Baseline:  Goal status: INITIAL  4.  With Mod I, patient will use dysarthria compensations (slow, loud, overpronounce, pause) in 2-3 sentence responses with 95% accuracy. Baseline:  Goal status: INITIAL   LONG TERM GOALS: Target date: 04/27/2024  With Mod I, pt will utilize safe swallwoing strategies in order to reduce risk of aspiration, dehydration and weight loss with consuming regular textures. Baseline:  Goal status: INITIAL  2.  With Mod I, pt will demonstrate the ability to adequately self-monitor swallowing skills and perform appropriate compensatory techniques to reduce s/s of aspiration and to safely consume least restrictive diet.     Baseline:  Goal status: INITIAL  3.  With Mod I, patient will improve speech intelligibility for  paragraph by controlling rate of speech, over-articulation, and increased loudness to achieve 95% intelligibility.  Baseline:  Goal status: INITIAL   ASSESSMENT:  CLINICAL IMPRESSION: Patient is a 53 year old right handed male who was seen today  for an dysarthria and dysphagia treatment d/t recent right internal capsule and pons CVA.    Pt presents with mild to moderate flaccid dysarthria d/t left side facial and lingual weakness that in reduced lip seal (during production of plosives) and imprecise lingual articulation. Pt's vocal quality was perceptionally wetter the longer that he talked. He reports that I start slobbering on the left side when I talk a lot.  Pt presents with mild oropharyngeal dysphagia that is c/b reduced lingual manipulation of boluses and left labial spillage of liquids. Pt reports my swallowing is decent but when I drink water it comes out on the left side. He reports drinking from a large insulated cup rim.   Pt eager and good response to therapy. See the above treatment note for details.    OBJECTIVE IMPAIRMENTS include dysarthria and dysphagia. These impairments are limiting patient from return to work, effectively communicating at home and in community, and safety when swallowing. Factors affecting potential to achieve goals and functional outcome are Multiple remote infarcts and chronic microvascular ischemic. Patient will benefit from skilled SLP services to address above impairments and improve overall function.  REHAB POTENTIAL: Good  PLAN: SLP FREQUENCY: 1-2x/week  SLP DURATION: 8 weeks  PLANNED INTERVENTIONS: Aspiration precaution training, Pharyngeal strengthening exercises, Diet toleration management , Trials of  upgraded texture/liquids, Oral motor exercises, SLP instruction and feedback, Compensatory strategies, and Patient/family education    Jaston Havens B. Rubbie, M.S., CCC-SLP, Tree surgeon Certified Brain Injury Specialist [facility]  [facility] Services Office [phone] Ascom [phone] Fax [phone]

## 2024-03-11 NOTE — Therapy (Signed)
 OUTPATIENT OCCUPATIONAL THERAPY NEURO TREATMENT NOTE  Patient Name: Mark Mcintosh MRN: [id number] DOB:1970/08/30, 53 y.o., male Today's Date: 03/11/2024  PCP: No PCP REFERRING PROVIDER: [name], PA-C  END OF SESSION:   OT End of Session - 03/11/24 0929     Visit Number 5    Number of Visits 24    Date for OT Re-Evaluation 05/11/24    OT Start Time 0930    OT Stop Time 1015    OT Time Calculation (min) 45 min    Activity Tolerance Patient tolerated treatment well    Behavior During Therapy [facility] for tasks assessed/performed             Past Medical History:  Diagnosis Date   Arthritis    Hypertension    Stroke (HCC) 01/09/2024   Stated he had 2 strokes.   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) 02/27/2024   Gait abnormality 02/27/2024   Left-sided weakness 02/27/2024   Prediabetes 02/27/2024   Abnormal pulse oximetry 02/27/2024   Vitamin D  deficiency 02/27/2024   Bradycardia 02/27/2024   Depression, major, single episode, moderate (HCC) 02/27/2024   Coping style affecting medical condition 01/20/2024   Right pontine cerebrovascular accident (HCC) 01/14/2024   Acute CVA (cerebrovascular accident) (HCC) 01/09/2024   Tobacco use disorder 01/09/2024   Obesity (BMI 30-39.9) 01/09/2024   Hypertension    ONSET DATE: 01/09/24  REFERRING DIAG: I63.9 (ICD-10-CM) - Acute CVA (cerebrovascular accident) (HCC)   THERAPY DIAG:  Muscle  weakness (generalized)  Other lack of coordination  Acute CVA (cerebrovascular accident) (HCC)  Rationale for Evaluation and Treatment: Rehabilitation  SUBJECTIVE:  SUBJECTIVE STATEMENT: Pt reports he has difficulty grading strength on his L hand.   Pt accompanied by: self  PERTINENT HISTORY: Per chart:  Mark Mcintosh is a 53 y.o. right-handed male with history significant for bilateral conjunctival inflammation hypertension as well as tobacco use and class II morbid obesity with BMI 36.26.  Per chart review patient lives alone independent prior to admission.  Presented to [facility] 01/09/2024 with acute onset of left-sided weakness.  MRI showed acute infarct in the posterior limb of the right internal capsule, the overlying right frontal white matter and pons.  Multiple remote infarcts and chronic microvascular ischemic disease.  CTA showed no large vessel occlusion.  Admission chemistries unremarkable except potassium 3.1.  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  TEE with small PFO per cardiology services with no plan for closure.  Neurology follow-up placed on aspirin  and Plavix  for CVA prophylaxis x 3 weeks then aspirin  alone.  Lovenox  for DVT prophylaxis but bilateral Doppler studies negative.  Therapy evaluations completed due to patient decreased functional mobility left-sided weakness was admitted for a comprehensive rehab program.   PRECAUTIONS: Fall  WEIGHT BEARING RESTRICTIONS: No  PAIN: No pain, just numbness in the hand  Are you having pain? 0/10  FALLS: Has patient fallen in last 6 months? Yes. 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  Get back my independence.    OBJECTIVE:  Note: Objective measures were completed at Evaluation unless otherwise noted.  HAND DOMINANCE: Right  ADLs: Overall ADLs: Son was staying with pt for about a week after d/c from inpatient rehab, but has since returned home.  Friend comes by every other day to check in.   Transfers/ambulation related to ADLs: modified indep with rollator Eating: increased difficulty with cutting food  Grooming: increased difficulty with clipping nails, otherwise manages fine with dominant/unaffected hand UB Dressing: increased time with clothing fasteners LB Dressing: increased time with clothing fasteners  Toileting: increased time to engage L hand into clothing management Bathing: pt sits on shower chair to wash LEs, some SOB with standing in shower Tub Shower transfers: Modified indep Equipment: Shower seat without back  IADLs: Shopping: motorized cart, can push cart for shorter shopping trips.  Light housekeeping: extra time and cautions to avoid falls, sometimes feels SOB with laundry  Meal Prep: friend helps with stove top cooking, pt can do light hot and cold meal prep  Community mobility: rollator, friend drove pt  Medication management: indep  Landscape architect: indep  Handwriting: NT; L non-dominant hand affected   MOBILITY STATUS: Hx of falls, rollator or funiture/wall walking in the home, rollator for community  POSTURE COMMENTS:  L sided hemiparesis   ACTIVITY TOLERANCE: Activity tolerance: Pt reports some dyspnea with prolonged standing/mobility  UPPER EXTREMITY ROM:  BUEs WFL  UPPER EXTREMITY MMT:     MMT Right eval Left eval  Shoulder flexion 5 4-  Shoulder abduction 5 4-  Shoulder adduction    Shoulder extension    Shoulder internal rotation    Shoulder external rotation    Middle trapezius    Lower trapezius    Elbow flexion 5 4-  Elbow extension 5 4-  Wrist flexion 5 4-  Wrist  extension 5 4-  Wrist ulnar deviation    Wrist radial deviation    Wrist pronation 5 4-  Wrist supination 5 4-  (Blank rows = not tested)  HAND FUNCTION: Grip strength: Right: 111 lbs; Left: 51 lbs, Lateral pinch: Right: 23 lbs, Left: 13 lbs, and 3 point pinch: Right: 26 lbs, Left: 13 lbs  COORDINATION: Finger Nose Finger test: increased time and decreased accuracy on the L  9 Hole Peg test: Right: 25 sec; Left: 1 min 27 sec  SENSATION: Pt reports increased tingling/numbness when hand balls up a little, but when he straightens it out it goes back to normal.   EDEMA: no visible edema   MUSCLE TONE: LUE: Mild   COGNITION: Overall cognitive status: Within functional limits for tasks assessed pt reportme difficulties with memory   VISION:             Subjective report: Wears glasses all the time.  Taking new medication for inflammation of bilat conjuctiva (prescribed prior to CVA)  VISION ASSESSMENT: Tracking/Visual pursuits: Able to track stimulus in all quads without difficulty Saccades: additional head turns occurred during testing Visual Fields: no apparent deficits  PERCEPTION: Not tested  PRAXIS: Impaired: Motor planning; mild-moderate apraxia and ataxia  throughout the LUE  OBSERVATIONS:  Pt pleasant, cooperative, and appears eager to work towards OT goals.                                                                                                             TREATMENT DATE: 03/11/24 Therapeutic Exercise: - 2 set x10 reps each Lateral, and 3pt. Pinch strengthening using green, blue, and black level resistive clips. Increased time and intermittent use of R hand to assist grasping clips.  -Progressive gross grip strengthening with  17.9# of grip strength resistive force. 1st trial pt removed pegs and placed in container at table height, second trial placed in container at shoulder height. Continues to report difficulty maintaining grasp -Reviewed HEP with red theraband,  good recall of technique. Added ER, shoulder shrugs, and scapular retraction.    Therapeutic Activity: -Participation in activities to promote LUE GMC/FMC skills. -Pt.  Focused on improving [facility] with manipulating nuts and bolts positioned vertically. Pt unscrew both the 1 and 1/2 nuts, however was unable to remove the 1/2 nuts without dropping them. Increased difficulty grasping and placing the nuts into position and alignment with vision occluded (L hand underneath). Pt required increased time and cues for hand control during the task. Increased dropping noted as pt fatigued and with dual tasking (maintaining conversation).  -Manipulation skills with [place]  discs focusing on in hand manipulation to flip discs, pt highly distracted by environment. L hand 1st trial 1'4, 2nd trial 50, R hand 27.  PATIENT EDUCATION: Education details: L hand FMC/dexterity skills Person educated: Patient Education method: Explanation and Verbal cues Education comprehension: verbalized understanding  HOME EXERCISE PROGRAM: Landy fake, [facility] handout  GOALS: Goals reviewed with patient? Yes  SHORT TERM GOALS: Target date: 03/30/24  Pt will be indep to perform HEP for increasing strength and coordination throughout the LUE. Baseline: Eval: Not yet initiated Goal status: INITIAL  LONG TERM GOALS: Target date: 05/11/24  Pt will increase LUE MMT by 1/2 grade or more to increase engagement of LUE into ADL/IADLs. Baseline: Eval: LUE grossly 4-/5 throughout (R 5/5) Goal status: INITIAL  2.  Pt will increase L grip strength by 20 lbs or more to enable pt to grasp and carry heavy ADL supplies in L hand. Baseline: Eval: L 51 lbs (R 111) Goal status: INITIAL  3.  Pt will increase L hand [facility] skills as demonstrated by completion of 9 hole peg test in <1 min to improve manipulation of small ADL supplies. Baseline: Eval: L 1 min 27 sec (R 25 sec) Goal status: INITIAL  4.  Pt will increase LUE GMC to  enable confidence with moving hot pots/pans on/off stove top and in/out of the oven using BUEs. Baseline: Eval: Pt reports the his friend currently assists with heavier meal prep Goal status: INITIAL  ASSESSMENT:  CLINICAL IMPRESSION: Good recall of HEP, added ER, shrugs, and scap retract - anticipate pt can return to green theraband after next session. Pt continues to present with moderate ataxia with GM/FM movements in the LUE, occasionally dropping pegs  and bolts during tasks. Reports improved FMC unscrewing and screwing 1 and 1/2 bolts. Pt reports difficulty cooking using L hand as assist, plan to trial kitchen activities next session. Pt will continue to benefit from continued skilled OT to work towards above noted goals for improving indep with daily tasks, return to work, reduce burden of care on caregivers, and improve QOL.    PERFORMANCE DEFICITS: in functional skills including ADLs, IADLs, coordination, dexterity, sensation, tone, strength, Fine motor control, Gross motor control, mobility, balance, body mechanics, endurance, decreased knowledge of precautions, decreased knowledge of use of DME, vision, and UE functional use, cognitive skills including memory, and psychosocial skills including coping strategies, environmental adaptation, and routines and behaviors.   IMPAIRMENTS: are limiting patient from ADLs, IADLs, work, and leisure.   CO-MORBIDITIES: has co-morbidities such as tobacco use disorder, HTN, arthritis that affects occupational performance. Patient will benefit from skilled OT to address above impairments and improve overall function.  MODIFICATION OR ASSISTANCE TO COMPLETE EVALUATION: No modification of tasks or assist necessary to complete an evaluation.  OT OCCUPATIONAL PROFILE AND HISTORY: Detailed assessment: Review of records and additional review of physical, cognitive, psychosocial history related to current functional performance.  CLINICAL DECISION MAKING:  Moderate - several treatment options, min-mod task modification necessary  REHAB POTENTIAL: Good  EVALUATION COMPLEXITY: Moderate    PLAN:  OT FREQUENCY: 2x/week  OT DURATION: 12 weeks  PLANNED INTERVENTIONS: 97168 OT Re-evaluation, 97535 self care/ADL training, 02889 therapeutic exercise, 97530 therapeutic activity, 97112 neuromuscular re-education, 97140 manual therapy, 97116 gait training, 02989 moist heat, 97010 cryotherapy, 97750 Physical Performance Testing, passive range of motion, balance training, functional mobility training, visual/perceptual remediation/compensation, psychosocial skills training, energy conservation, coping strategies training, patient/family education, and DME and/or AE instructions  RECOMMENDED OTHER SERVICES: SLP eval and treat d/t pt reporting memory deficits since CVA, drooling, and slurred speech  CONSULTED AND AGREED WITH PLAN OF CARE: Patient  PLAN FOR NEXT SESSION: see above  Elston Slot, M.S. OTR/L  03/11/24, 9:31 AM  ascom [phone]   Elston JINNY Slot, OT 03/11/2024, 9:31 AM

## 2024-03-11 NOTE — Therapy (Signed)
 OUTPATIENT PHYSICAL THERAPY TREATMENT  Patient Name: Mark Mcintosh MRN: [id number] DOB:[birth year], 53 y.o., male Today's Date: 03/11/2024  PCP: None REFERRING PROVIDER: [name], PA-C  END OF SESSION:  PT End of Session - 03/11/24 0848     Visit Number 6    Number of Visits 25    Date for PT Re-Evaluation 05/11/24    Progress Note Due on Visit 10    PT Start Time 0845    PT Stop Time 0925    PT Time Calculation (min) 40 min    Equipment Utilized During Treatment Gait belt    Activity Tolerance Patient tolerated treatment well;No increased pain    Behavior During Therapy [facility] for tasks assessed/performed          Past Medical History:  Diagnosis Date   Arthritis    Hypertension    Stroke (HCC) 01/09/2024   Stated he had 2 strokes.   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) 02/27/2024   Gait abnormality 02/27/2024   Left-sided weakness 02/27/2024   Prediabetes 02/27/2024   Abnormal pulse oximetry 02/27/2024   Vitamin D  deficiency 02/27/2024   Bradycardia 02/27/2024   Depression, major, single episode, moderate (HCC) 02/27/2024   Coping style affecting medical condition 01/20/2024   Right pontine cerebrovascular accident (HCC) 01/14/2024   Acute CVA (cerebrovascular accident) (HCC) 01/09/2024   Tobacco use disorder 01/09/2024   Obesity (BMI 30-39.9) 01/09/2024   Hypertension     ONSET DATE: 01/09/24  REFERRING DIAG: I63.9  (ICD-10-CM) - Acute CVA (cerebrovascular accident) (HCC)   THERAPY DIAG:  Muscle weakness (generalized)  Other lack of coordination  Difficulty in walking, not elsewhere classified  Rationale for Evaluation and Treatment: Rehabilitation  SUBJECTIVE:                                                                                                                                                                                             SUBJECTIVE STATEMENT: Pt says he's began using SPC more last session's training. No other updates.   Pt accompanied by: self  PERTINENT HISTORY: [facility] ED 01/09/24 for progressively worsening L sided-weakness. multiple acute infarcts  concerning for embolic showering. PMH includes arthritis, HTN  PAIN:  Are you having pain? No  PRECAUTIONS: None  RED FLAGS: None   WEIGHT BEARING RESTRICTIONS: No  FALLS: Has patient fallen in last 6 months? Yes. Number of falls 1- pt reports that he tried to get up at home after returning from [place] and he got up too fast and fell. 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  DIAGNOSTIC FINDINGS: via chart  From 01/09/24 MRI Brain W/O Contrast: IMPRESSION: 1. Acute infarcts in the posterior limb of the right internal capsule, the overlying right frontal white matter, and pons. Given involvement of multiple vascular territories, consider an embolic etiology. 2. Multiple remote  infarcts and chronic microvascular ischemic disease, detailed above.  From 01/10/24 CT Angio Head Neck W W/O CM: IMPRESSION: 1. No acute intracranial hemorrhage or mass effect. 2. No large vessel occlusion, hemodynamically significant stenosis, or aneurysm in the head or neck. 3. Remote lacunar infarcts in the genu of the left internal capsule, globus pallidus, and right corona radiata. 4. Acute infarct involving the posterior limb of the right internal capsule is less well appreciated at CT. 5. Moderately advanced periventricular white matter disease for age.  COGNITION: Overall cognitive status: Within functional limits for tasks assessed   SENSATION: WFL  COORDINATION: Heel to shin: Slower on L, WFL on R Finger to nose: Slower on L with overshooting on first 2 reps, WFL on R  POSTURE: rounded shoulders, forward head, and weight shift right  LOWER EXTREMITY MMT:    MMT Right Eval Left Eval  Hip flexion 4- 3+  Hip extension    Hip abduction 5 5  Hip adduction 4+ 4+  Hip internal rotation    Hip external rotation     Knee flexion 5 4+  Knee extension 5 4+  Ankle dorsiflexion 5 4-  Ankle plantarflexion 5 4-  Ankle inversion    Ankle eversion    (Blank rows = not tested)  BED MOBILITY:  Not tested- pt reports being IND  TRANSFERS: Sit to stand: Modified independence  Assistive device utilized: None     Stand to sit: Modified independence  Assistive device utilized: None     Chair to chair: SBA  Assistive device utilized: None       STAIRS: Findings: Level of Assistance: CGA, Stair Negotiation Technique: Alternating Pattern  with Bilateral Rails, Number of Stairs: 4, Height of Stairs: 6   , and Comments: Pt with decreased foot clearance bilaterally L>R , heavy UE support when descending GAIT: Findings: Gait Characteristics: step through pattern, decreased arm swing- Left, decreased step length- Left, decreased stance time- Left, decreased ankle dorsiflexion-  Left, lateral lean- Left, wide BOS, poor foot clearance- Right, and poor foot clearance- Left, Distance walked: approx 50 feet, Assistive device utilized:None, Level of assistance: CGA, and Comments: Ambulation for and into/out of clinic  FUNCTIONAL TESTS:  5 times sit-to-stand: 17.9 seconds, pt utilizing arms on  knees to assist with standing : 0.56 m/s with 4WW and CGA; 0.73 m/s with no AD and CGA BERG Balance Scale: 33/56 FGA: 18/30 on 02/25/24 : 795 ft with 4WW on 02/25/24  PATIENT SURVEYS:  Stroke Impact Scale: 45/80                                                                                                                             TREATMENT DATE: 03/11/24   -overground AMB c SPC x928ft 68m0s -AMB stairs x40 (20 up, 20 down) with 3lb AW  *sit break -134ft AMB: 3lb AW bilat, 15lb KB Rt, 3lb KB Lt  -resisted cable AMB: backwards concentric forward eccentric: 2 hand grip at 22.5lb (3x24ft) -AMB c 3lb AW to wellzone, return with 25lb [place] Bag *sit break -184ft AMB: 3lb AW bilat, 15lb KB Rt, 3lb KB Lt  -resisted cable AMB: backwards concentric forward eccentric: 2 hand grip at 27.5lb (2x73ft)  -AMB c 3lb AW to wellzone, to return 25lb [place] Bag   PATIENT EDUCATION: Education details: Pt educated on examination findings and thorough demonstration/explanation of new HEP provided during session  Encouraged to utilize [facility] for community mobility, but decrease distance initially to ensure safety given increased physical and mental demand of gait with SPC vs rollator.   Person educated: Patient Education method: Explanation, Demonstration, and Handouts Education comprehension: verbalized understanding and returned demonstration  HOME EXERCISE PROGRAM: Access Code: [email or web address]  URL: [email or web address]  Date: 02/17/2024  Prepared by: Darryle Patten  Exercises: - Seated March  - 1 x daily - 3 x weekly - 3 sets - 10 reps  - Standing March with  Counter Support  - 1 x daily - 3 x weekly - 3 sets - 10 reps  - Mini Squat with Counter Support  - 1 x daily - 3 x weekly - 3 sets - 10 reps  - Heel Toe Raises with Counter Support  - 1 x daily - 3 x weekly - 3 sets - 10 reps   GOALS: Goals reviewed with patient? Yes  SHORT TERM GOALS: Target date: 03/09/2024  Patient will be independent in home exercise program to improve strength/mobility for better functional independence with ADLs. Baseline: HEP reviewed during eval Goal status: INITIAL  LONG TERM GOALS: Target date: 05/11/2024  Patient will increase their Stroke Impact Scale score by >10 points for improved function and independence Baseline: 45/80 Goal status: INITIAL  2.  Patient (< 40 years old) will complete five times sit to stand test in < 10 seconds indicating an increased LE strength and improved balance. Baseline: 17.9 seconds with UE support Goal status: INITIAL  3.  Patient will increase 10 meter walk test to >0.8 m/s as to improve gait speed for better community ambulation and to reduce fall risk. Baseline: 0.56 m/s with 4WW & 0.73 m/s with no AD  Goal status: INITIAL  4.  Patient will increase Berg Balance score by > 6 points to demonstrate  decreased fall risk during functional activities. Baseline: 33/56 Goal status: INITIAL  5.  Patient will increase Functional Gait Assessment score to >20/30 as to reduce fall risk and improve dynamic gait safety with community ambulation. Baseline:18/30 on 02/25/24 Goal status: INITIAL  6.  Patient will increase six minute walk test distance by >50 ft for progression to community ambulator and improve gait ability Baseline: 753ft with 4WW on 02/25/24; 03/11/24: 952ft c SPC  Goal status: INITIAL  ASSESSMENT:  CLINICAL IMPRESSION: Took a page from pt's job history book and began adding in some more high intensity, functional balance activity. Incorporated use of arm as able. Pt doe swell, but clearly is fatigued and takes  advantage of recovery breaks for best performance overall. Patient will benefit from skilled physical therapy intervention to reduce deficits and impairments identified in evaluation, in order to reduce pain, improve quality of life, and maximize activity tolerance for ADL, IADL, and leisure/fitness. Physical therapy will help pt achieve long and short term goals of care.   OBJECTIVE IMPAIRMENTS: Abnormal gait, decreased activity tolerance, decreased balance, decreased coordination, decreased endurance, decreased mobility, difficulty walking, decreased ROM, decreased strength, hypomobility, impaired perceived functional ability, impaired flexibility, impaired UE functional use, improper body mechanics, and postural dysfunction.   ACTIVITY LIMITATIONS: carrying, lifting, bending, sitting, standing, squatting, stairs, transfers, bathing, reach over head, hygiene/grooming, and locomotion level  PARTICIPATION LIMITATIONS: meal prep, cleaning, laundry, driving, shopping, community activity, occupation, and yard work  PERSONAL FACTORS: Age and Fitness are also affecting patient's functional outcome.   REHAB POTENTIAL: Good  CLINICAL DECISION MAKING: Evolving/moderate complexity  EVALUATION COMPLEXITY: Moderate  PLAN:  PT FREQUENCY: 2x/week  PT DURATION: 12 weeks  PLANNED INTERVENTIONS: [id number]- PT Re-evaluation, 97750- Physical Performance Testing, 97110-Therapeutic exercises, 97530- Therapeutic activity, [id number]- Neuromuscular re-education, 97535- Self Care, [id number]- Manual therapy, [id number]- Gait training, [id number]- Orthotic Initial, [id number]- Orthotic/Prosthetic subsequent, [phone]- Canalith repositioning, [id number]- Splinting, [id number]- Electrical stimulation (manual), [id number]- Ultrasound, [id number]- Traction (mechanical), [phone] (1-2 muscles), [id number] (3+ muscles)- Dry Needling, Patient/Family education, Balance training, Stair training, Taping, Joint mobilization, Spinal mobilization, Vestibular training, Visual/preceptual  remediation/compensation, Cognitive remediation, DME instructions, Cryotherapy, and Moist heat  PLAN FOR NEXT SESSION:   Continue LE strengthening, progress dynamic balance, weight shifting tasks to promote WB on L LE,  Dynamic gait training.   Peggye JAYSON Linear PT ,DPT Physical Therapist- [facility]   8:50 AM 03/11/24   9:31 AM, 03/11/24 Peggye JAYSON Linear, PT, DPT Physical Therapist - [facility]  Outpatient Physical Therapy- Main Campus [phone]

## 2024-03-12 ENCOUNTER — Telehealth: Payer: Self-pay | Admitting: Family Medicine

## 2024-03-12 NOTE — Telephone Encounter (Signed)
 Patient Wants a call back from clinical to discuss disability paperwork. I informed E2C2 that patient may need an appointment to discuss with the provider.

## 2024-03-12 NOTE — Telephone Encounter (Signed)
 Pt came by and filled out ROI and it was faxed to medical records to be processed.

## 2024-03-12 NOTE — Telephone Encounter (Signed)
 Patient states someone already called him and he did not need to speak with anyone else at this time

## 2024-03-15 ENCOUNTER — Ambulatory Visit
Admission: RE | Admit: 2024-03-15 | Discharge: 2024-03-15 | Disposition: A | Discharge status: Home / Self Care | Source: Ambulatory Visit | Attending: Family Medicine | Admitting: Family Medicine

## 2024-03-15 ENCOUNTER — Encounter: Admitting: Occupational Therapy

## 2024-03-15 ENCOUNTER — Ambulatory Visit
Admission: RE | Admit: 2024-03-15 | Discharge: 2024-03-15 | Disposition: A | Discharge status: Home / Self Care | Source: Ambulatory Visit | Attending: Family Medicine

## 2024-03-15 ENCOUNTER — Ambulatory Visit: Admitting: Physical Therapy

## 2024-03-15 DIAGNOSIS — D1809 Hemangioma of other sites: Secondary | ICD-10-CM | POA: Diagnosis present

## 2024-03-15 DIAGNOSIS — R928 Other abnormal and inconclusive findings on diagnostic imaging of breast: Secondary | ICD-10-CM

## 2024-03-15 HISTORY — PX: BREAST BIOPSY: SHX20

## 2024-03-15 MED ORDER — LIDOCAINE 1 % OPTIME INJ - NO CHARGE
2.0000 mL | Freq: Once | INTRAMUSCULAR | Status: AC
  Administered 2024-03-15: 2 mL
  Filled 2024-03-15: qty 2

## 2024-03-15 MED ORDER — LIDOCAINE-EPINEPHRINE 1 %-1:100000 IJ SOLN
5.0000 mL | Freq: Once | INTRAMUSCULAR | Status: AC
  Administered 2024-03-15: 5 mL
  Filled 2024-03-15: qty 5

## 2024-03-16 ENCOUNTER — Ambulatory Visit: Admitting: Physical Therapy

## 2024-03-16 ENCOUNTER — Ambulatory Visit: Admitting: Speech Pathology

## 2024-03-16 ENCOUNTER — Ambulatory Visit

## 2024-03-16 LAB — SURGICAL PATHOLOGY

## 2024-03-17 ENCOUNTER — Ambulatory Visit: Admitting: Physical Therapy

## 2024-03-18 ENCOUNTER — Ambulatory Visit: Admitting: Physical Medicine & Rehabilitation

## 2024-03-18 ENCOUNTER — Ambulatory Visit

## 2024-03-18 ENCOUNTER — Ambulatory Visit: Admitting: Physical Therapy

## 2024-03-18 ENCOUNTER — Ambulatory Visit: Admitting: Speech Pathology

## 2024-03-18 DIAGNOSIS — R471 Dysarthria and anarthria: Secondary | ICD-10-CM

## 2024-03-18 DIAGNOSIS — R2681 Unsteadiness on feet: Secondary | ICD-10-CM

## 2024-03-18 DIAGNOSIS — M6281 Muscle weakness (generalized): Secondary | ICD-10-CM

## 2024-03-18 DIAGNOSIS — R278 Other lack of coordination: Secondary | ICD-10-CM

## 2024-03-18 DIAGNOSIS — I639 Cerebral infarction, unspecified: Secondary | ICD-10-CM

## 2024-03-18 DIAGNOSIS — R262 Difficulty in walking, not elsewhere classified: Secondary | ICD-10-CM

## 2024-03-18 NOTE — Therapy (Signed)
 OUTPATIENT OCCUPATIONAL THERAPY NEURO TREATMENT NOTE  Patient Name: Mark Mcintosh MRN: [id number] DOB:[birth year], 53 y.o., male Today's Date: 03/18/2024  PCP: No PCP REFERRING PROVIDER: [name], PA-C  END OF SESSION:   OT End of Session - 03/18/24 1021     Visit Number 6    Number of Visits 24    Date for OT Re-Evaluation 05/11/24    OT Start Time 1015    Activity Tolerance Patient tolerated treatment well    Behavior During Therapy [facility] for tasks assessed/performed             Past Medical History:  Diagnosis Date   Arthritis    Hypertension    Stroke (HCC) 01/09/2024   Stated he had 2 strokes.   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) 02/27/2024   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) 02/27/2024   Gait abnormality 02/27/2024   Left-sided weakness 02/27/2024   Prediabetes 02/27/2024   Abnormal pulse oximetry 02/27/2024   Vitamin D  deficiency 02/27/2024   Bradycardia 02/27/2024   Depression, major, single episode, moderate (HCC) 02/27/2024   Coping style affecting medical condition 01/20/2024   Right pontine cerebrovascular accident (HCC) 01/14/2024   Acute CVA (cerebrovascular accident) (HCC) 01/09/2024   Tobacco use disorder 01/09/2024   Obesity (BMI 30-39.9) 01/09/2024   Hypertension    ONSET DATE: 01/09/24  REFERRING DIAG: I63.9 (ICD-10-CM) - Acute CVA  (cerebrovascular accident) (HCC)   THERAPY DIAG:  Muscle weakness (generalized)  Other lack of coordination  Acute CVA (cerebrovascular accident) (HCC)  Rationale for Evaluation and Treatment: Rehabilitation  SUBJECTIVE:  SUBJECTIVE STATEMENT: See below in treatment note for subjective  Pt accompanied by: self  PERTINENT HISTORY: Per chart:  Mark Mcintosh is a 53 y.o. right-handed male with history significant for bilateral conjunctival inflammation hypertension as well as tobacco use and class II morbid obesity with BMI 36.26.  Per chart review patient lives alone independent prior to admission.  [facility] with acute onset of left-sided weakness.  MRI showed acute infarct in the posterior limb of the right internal capsule, the overlying right frontal white matter and pons.  Multiple remote infarcts and chronic microvascular ischemic disease.  CTA showed no large vessel occlusion.  Admission chemistries unremarkable except potassium 3.1.  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  TEE with small PFO per cardiology services with no plan for closure.  Neurology follow-up placed on aspirin  and Plavix  for CVA prophylaxis x 3 weeks then aspirin  alone.  Lovenox  for DVT prophylaxis but bilateral Doppler studies negative.  Therapy evaluations completed due to patient decreased functional mobility left-sided weakness was admitted for a comprehensive rehab program.   PRECAUTIONS: Fall  WEIGHT BEARING RESTRICTIONS: No  PAIN: No pain, just numbness in the hand  Are you having pain? 0/10  FALLS: Has patient fallen in last 6 months? Yes. 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  Get back my independence.    OBJECTIVE:  Note: Objective measures were completed at Evaluation unless otherwise noted.  HAND DOMINANCE: Right  ADLs: Overall ADLs: Son was staying with pt for about a week after d/c from inpatient rehab, but has since returned home.  Friend comes by every other day to check in.   Transfers/ambulation related to ADLs: modified indep with rollator Eating: increased difficulty with cutting food  Grooming: increased difficulty with clipping nails, otherwise manages fine with dominant/unaffected hand UB Dressing: increased time with clothing fasteners LB Dressing: increased time with clothing fasteners  Toileting: increased time to engage L hand into clothing management Bathing: pt sits on shower chair to wash LEs, some SOB with standing in shower Tub Shower transfers: Modified indep Equipment: Shower seat without back  IADLs: Shopping: motorized cart, can push cart for shorter shopping trips.  Light housekeeping: extra time and cautions to avoid falls, sometimes feels SOB with laundry  Meal Prep: friend helps with stove top cooking, pt can do light hot and cold meal prep  Community mobility: rollator, friend drove pt  Medication management: indep  Landscape architect: indep  Handwriting: NT; L non-dominant hand affected   MOBILITY STATUS: Hx of falls, rollator or funiture/wall walking in the home, rollator for community  POSTURE COMMENTS:  L sided hemiparesis   ACTIVITY TOLERANCE: Activity tolerance: Pt reports some dyspnea with prolonged standing/mobility  UPPER EXTREMITY ROM:  BUEs WFL  UPPER EXTREMITY MMT:     MMT Right eval Left eval  Shoulder flexion 5 4-  Shoulder abduction 5 4-  Shoulder adduction    Shoulder extension    Shoulder internal rotation    Shoulder external rotation    Middle trapezius    Lower trapezius    Elbow flexion 5 4-  Elbow  extension 5 4-  Wrist flexion 5 4-  Wrist extension 5 4-  Wrist ulnar deviation    Wrist radial deviation    Wrist pronation 5 4-  Wrist supination 5 4-  (Blank rows = not tested)  HAND FUNCTION: Grip strength: Right: 111 lbs; Left: 51 lbs, Lateral pinch: Right: 23 lbs, Left: 13 lbs, and 3 point pinch: Right: 26 lbs, Left: 13 lbs  COORDINATION: Finger Nose Finger test: increased time and decreased accuracy on the L  9 Hole Peg test: Right: 25 sec; Left: 1 min 27 sec  SENSATION: Pt reports increased tingling/numbness when hand balls up a little, but when he straightens it out it goes back to normal.   EDEMA: no visible edema   MUSCLE TONE: LUE: Mild   COGNITION: Overall cognitive status: Within functional limits for tasks assessed pt reportme difficulties with memory   VISION:             Subjective report: Wears glasses all the time.  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                                                                                                             TREATMENT DATE: 03/18/24 Pt reports he is still having difficulty with manipulation of small objects like washers, cards, difficulty holding utensils when performing dish washing.  Worked hard in PT today.  He had a biopsy on his chest Monday and missed therapy but he said it came back OK and benign.    Therapeutic Exercise: -Progressive gross grip strengthening with  17.9# of grip strength resistive force. Ataxia a limiting factor with reaching tasks, lacks precision  of movement patterns.  Patient requires cues for hand placement on gripper.  Neuromuscular Reeducation: Focus on coordination skills with  manipulation of Grooved pegs with left hand, increased difficulty noted but when slowed down and focusing on task he is able to complete.  Manipulation of poker chip discs to pick up from tabletop and place into slot.  Patient requires cues to slow down tasks and focus on more deliberate movement patterns, patient dropping items frequently  PATIENT EDUCATION: Education details: L hand FMC/dexterity skills Person educated: Patient Education method: Explanation and Verbal cues Education comprehension: verbalized understanding  HOME EXERCISE PROGRAM: Landy fake, [facility] handout  GOALS: Goals reviewed with patient? Yes  SHORT TERM GOALS: Target date: 03/30/24  Pt will be indep to perform HEP for increasing strength and coordination throughout the LUE. Baseline: Eval: Not yet initiated Goal status: INITIAL  LONG TERM GOALS: Target date: 05/11/24  Pt will increase LUE MMT by 1/2 grade or more to increase engagement of LUE into ADL/IADLs. Baseline: Eval: LUE grossly 4-/5 throughout (R 5/5) Goal status: INITIAL  2.  Pt will increase L grip strength by 20 lbs or more to enable pt to grasp and carry heavy ADL supplies in L hand. Baseline: Eval: L 51 lbs (R 111) Goal status: INITIAL  3.  Pt will increase L hand [facility] skills as demonstrated by completion of 9 hole peg test in <1 min to improve manipulation of small ADL supplies. Baseline: Eval: L 1 min 27 sec (R 25 sec) Goal status: INITIAL  4.  Pt will increase LUE GMC to enable confidence with moving hot pots/pans on/off stove top and in/out of the oven using BUEs. Baseline: Eval: Pt reports the his friend currently assists with heavier meal prep Goal status: INITIAL  ASSESSMENT:  CLINICAL IMPRESSION: Patient seen this date for focus on gripping patterns, reaching and fine motor coordination.   Patient remains limited by ataxia with functional reaching.  He responds well to verbal and tactile cues, requires cues to slow down movement patterns for more deliberate and intentional movements for tasks.  Patient also had a biopsy of his chest earlier this week and still has some soreness which limited his reaching patterns today.  Pt will continue to benefit from continued skilled OT to work towards above noted  goals for improving indep with daily tasks, return to work, reduce burden of care on caregivers, and improve QOL.    PERFORMANCE DEFICITS: in functional skills including ADLs, IADLs, coordination, dexterity, sensation, tone, strength, Fine motor control, Gross motor control, mobility, balance, body mechanics, endurance, decreased knowledge of precautions, decreased knowledge of use of DME, vision, and UE functional use, cognitive skills including memory, and psychosocial skills including coping strategies, environmental adaptation, and routines and behaviors.   IMPAIRMENTS: are limiting patient from ADLs, IADLs, work, and leisure.   CO-MORBIDITIES: has co-morbidities such as tobacco use disorder, HTN, arthritis that affects occupational performance. Patient will benefit from skilled OT to address above impairments and improve overall function.  MODIFICATION OR ASSISTANCE TO COMPLETE EVALUATION: No modification of tasks or assist necessary to complete an evaluation.  OT OCCUPATIONAL PROFILE AND HISTORY: Detailed assessment: Review of records and additional review of physical, cognitive, psychosocial history related to current functional performance.  CLINICAL DECISION MAKING: Moderate - several treatment options, min-mod task modification necessary  REHAB POTENTIAL: Good  EVALUATION COMPLEXITY: Moderate    PLAN:  OT FREQUENCY: 2x/week  OT DURATION: 12 weeks  PLANNED INTERVENTIONS: 97168 OT Re-evaluation, 97535 self care/ADL training, 02889 therapeutic exercise, 97530 therapeutic  activity, 97112 neuromuscular re-education, 97140 manual therapy, 97116 gait training, 02989 moist heat, 97010 cryotherapy, 97750 Physical Performance Testing, passive range of motion, balance training, functional mobility training, visual/perceptual remediation/compensation, psychosocial skills training, energy conservation, coping strategies training, patient/family education, and DME and/or AE instructions  RECOMMENDED OTHER SERVICES: SLP eval and treat d/t pt reporting memory deficits since CVA, drooling, and slurred speech  CONSULTED AND AGREED WITH PLAN OF CARE: Patient  PLAN FOR NEXT SESSION: see above   Ether Wolters T Atarah Cadogan, OTR/L, CLT  03/18/2024, 5:31 PM

## 2024-03-18 NOTE — Therapy (Signed)
 OUTPATIENT SPEECH LANGUAGE PATHOLOGY  SWALLOW and DYSARTHRIA TREATMENT NOTE   Patient Name: Mark Mcintosh MRN: [id number] DOB:March 09, 1971, 53 y.o., male Today's Date: 03/18/2024  PCP: Curtis Boom, FNP REFERRING PROVIDER: Fidela Ned, NP   End of Session - 03/18/24 0934     Visit Number 4    Number of Visits 17    Date for SLP Re-Evaluation 04/27/24    Authorization Type [employer]    Progress Note Due on Visit 10    SLP Start Time 0930    SLP Stop Time  1015    SLP Time Calculation (min) 45 min    Activity Tolerance Patient tolerated treatment well          Past Medical History:  Diagnosis Date   Arthritis    Hypertension    Stroke (HCC) 01/09/2024   Stated he had 2 strokes.   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) 02/27/2024   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) 02/27/2024   Gait abnormality 02/27/2024   Left-sided weakness 02/27/2024   Prediabetes 02/27/2024   Abnormal pulse oximetry 02/27/2024   Vitamin D  deficiency 02/27/2024   Bradycardia 02/27/2024   Depression, major, single episode, moderate (HCC) 02/27/2024   Coping style affecting medical condition 01/20/2024   Right pontine cerebrovascular accident (HCC) 01/14/2024   Acute CVA (cerebrovascular accident) (HCC) 01/09/2024   Tobacco use disorder 01/09/2024   Obesity (BMI 30-39.9)  01/09/2024   Hypertension     ONSET DATE: 01/09/2024   REFERRING DIAG:  R47.1 (ICD-10-CM) - Dysarthria  R13.10 (ICD-10-CM) - Dysphagia    THERAPY DIAG:  Dysarthria and anarthria  Rationale for Evaluation and Treatment Rehabilitation  SUBJECTIVE:   PERTINENT HISTORY and DIAGNOSTIC FINDINGS: Pt is a right handed 53 year old male with past medical history of hypertension and tobacco use disorder. He presented to [facility] ED on 01/09/2024 with left sided weakness. MRI on 01/10/2024 revealed 1. Acute infarcts in the posterior limb of the right internal  capsule, the overlying right frontal white matter, and pons. Given  involvement of multiple vascular territories, consider an embolic  etiology.  2. Multiple remote infarcts and chronic microvascular ischemic  disease. Pt received ST services at [facility] from 01/16/2024 thru 01/29/2024.   PAIN:  Are you having pain? No  FALLS: Has patient fallen in last 6 months?  No  LIVING ENVIRONMENT: Lives with: lives alone Lives in: House/apartment  PLOF:  Level of assistance: Independent with ADLs, Independent with IADLs Employment: Full-time employment  SUBJECTIVE STATEMENT: Pt pleasant, reports continued improvement/progress Pt accompanied by: self  PATIENT GOALS  to improve speech and swallow abilities  OBJECTIVE:   TODAY'S TREATMENT NOTE: Skilled treatment session targeted pt's dysarthria and dysphagia goals. SLP facilitated session by providing the following interventions:  Pt reports good results form biopsy. He mentions he forgot his EMST but reports he continues to practice  Pt with great speech intelligibility when describing  unknown topics/semantic ideas in a noisy enviornment using speech intelligibility strategies to achieve > 95% intelligibility  PATIENT EDUCATION: Education details: see above Person educated: Patient Education method: Explanation and written cues Education comprehension: verbalized understanding and needs  further education  HOME EXERCISE PROGRAM:     Aspiration Precautions  Compensatory Swallow strategies  Complete EMST   GOALS: Goals reviewed with patient? Yes  SHORT TERM GOALS: Target date: 10 sessions  With Mod I, pt will keep food and liquid in mouth while eating without losing the bolus out of the front of the mouth to safely consume least restrictive diet. Baseline: Goal status: INITIAL  2.  With Mod I, pt will complete lingual strengthening exercises (anterior and posterior) x 15 times in order to increase muscle strength to reduce oral residue given regular textures.  Baseline:  Goal status: INITIAL  3.  With Mod I, patient will complete 3 sets of 10 repetitions with EMST set at 100cmH2O with self-reported effortful of 7 out of 10. Baseline:  Goal status: INITIAL  4.  With Mod I, patient will use dysarthria compensations (slow, loud, overpronounce, pause) in 2-3 sentence responses with 95% accuracy. Baseline:  Goal status: INITIAL   LONG TERM GOALS: Target date: 04/27/2024  With Mod I, pt will utilize safe swallwoing strategies in order to reduce risk of aspiration, dehydration and weight loss with consuming regular textures. Baseline:  Goal status: INITIAL  2.  With Mod I, pt will demonstrate the ability to adequately self-monitor swallowing skills and perform appropriate compensatory techniques to reduce s/s of aspiration and to safely consume least restrictive diet.     Baseline:  Goal status: INITIAL  3.  With Mod I, patient will improve speech intelligibility for  paragraph by controlling rate of speech, over-articulation, and increased loudness to achieve 95% intelligibility.  Baseline:  Goal status: INITIAL   ASSESSMENT:  CLINICAL IMPRESSION: Patient is a 54 year old right handed male who was seen today for an dysarthria and dysphagia treatment d/t recent right internal capsule and pons CVA.    Pt presents with mild to moderate flaccid dysarthria d/t  left side facial and lingual weakness that in reduced lip seal (during production of plosives) and imprecise lingual articulation. Pt's vocal quality was perceptionally wetter the longer that he talked. He reports that I start slobbering on the left side when I talk a lot.  Pt presents with mild oropharyngeal dysphagia that is c/b reduced lingual manipulation of boluses and left labial spillage of liquids. Pt reports my swallowing is decent but when I drink water it comes out on the left side. He reports drinking from a large insulated cup rim.   Pt eager and good response to therapy. See the above treatment note for details.    OBJECTIVE IMPAIRMENTS include dysarthria and dysphagia. These impairments are limiting patient from return to work, effectively communicating at home and in community, and safety when swallowing. Factors affecting potential to achieve goals and functional outcome are Multiple remote infarcts and chronic microvascular ischemic. Patient will benefit from skilled SLP services to address above impairments and improve overall function.  REHAB POTENTIAL: Good  PLAN: SLP FREQUENCY: 1-2x/week  SLP DURATION: 8 weeks  PLANNED INTERVENTIONS: Aspiration precaution training, Pharyngeal strengthening exercises, Diet toleration management , Trials of upgraded texture/liquids, Oral motor exercises, SLP instruction and feedback, Compensatory strategies, and Patient/family education    Carolann Brazell B. Rubbie, M.S., CCC-SLP, CBIS Speech-Language Pathologist Certified Brain Injury Specialist [place]  [facility] Rehabilitation  Services Office [phone] Ascom [phone] Fax [phone]

## 2024-03-18 NOTE — Therapy (Signed)
 OUTPATIENT PHYSICAL THERAPY TREATMENT  Patient Name: Mark Mcintosh MRN: [id number] DOB:May 02, 1971, 53 y.o., male Today's Date: 03/18/2024  PCP: None REFERRING PROVIDER: [name], PA-C  END OF SESSION:  PT End of Session - 03/18/24 0847     Visit Number 7    Number of Visits 25    Date for PT Re-Evaluation 05/11/24    Progress Note Due on Visit 10    PT Start Time [phone]    PT Stop Time 0930    PT Time Calculation (min) 38 min    Equipment Utilized During Treatment Gait belt    Activity Tolerance Patient tolerated treatment well;No increased pain    Behavior During Therapy [facility] for tasks assessed/performed          Past Medical History:  Diagnosis Date   Arthritis    Hypertension    Stroke (HCC) 01/09/2024   Stated he had 2 strokes.   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) 02/27/2024   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) 02/27/2024   Gait abnormality 02/27/2024   Left-sided weakness 02/27/2024   Prediabetes 02/27/2024   Abnormal pulse oximetry 02/27/2024   Vitamin D  deficiency 02/27/2024   Bradycardia 02/27/2024   Depression, major, single episode, moderate (HCC) 02/27/2024   Coping style affecting medical condition 01/20/2024   Right pontine cerebrovascular accident (HCC) 01/14/2024   Acute CVA (cerebrovascular accident) (HCC) 01/09/2024   Tobacco use disorder  01/09/2024   Obesity (BMI 30-39.9) 01/09/2024   Hypertension     ONSET DATE: 01/09/24  REFERRING DIAG: I63.9 (ICD-10-CM) - Acute CVA (cerebrovascular accident) (HCC)   THERAPY DIAG:  Difficulty in walking, not elsewhere classified  Muscle weakness (generalized)  Unsteadiness on feet  Rationale for Evaluation and Treatment: Rehabilitation  SUBJECTIVE:                                                                                                                                                                                             SUBJECTIVE STATEMENT: Pt reports doing well today. Pt denies any recent falls/stumbles  since prior session. Pt denies any updates to medications or medical appointment since prior session. Pt reports good compliance with HEP when time permits.    Pt accompanied by: self  PERTINENT HISTORY: [facility] ED 01/09/24 for progressively worsening L sided-weakness. multiple acute infarcts concerning for embolic showering. PMH includes arthritis, HTN  PAIN:  Are you having pain? No  PRECAUTIONS: None  RED FLAGS: None   WEIGHT BEARING RESTRICTIONS: No  FALLS: Has patient fallen in last 6 months? Yes. Number of falls 1- pt reports that he tried to get up at home after returning from [place] and he got up too fast and fell. 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  DIAGNOSTIC FINDINGS: via chart  From 01/09/24 MRI Brain W/O  Contrast: IMPRESSION: 1. Acute infarcts in the posterior limb of the right internal capsule, the overlying right frontal white matter, and pons. Given involvement of multiple vascular territories, consider an embolic etiology. 2. Multiple remote infarcts and chronic microvascular ischemic disease, detailed above.  From 01/10/24 CT Angio Head Neck W W/O CM: IMPRESSION: 1. No acute intracranial hemorrhage or mass effect. 2. No large vessel occlusion, hemodynamically significant stenosis, or aneurysm in the head or neck. 3. Remote lacunar infarcts in the genu of the left internal capsule, globus pallidus, and right corona radiata. 4. Acute infarct involving the posterior limb of the right internal capsule is less well appreciated at CT. 5. Moderately advanced periventricular white matter disease for age.  COGNITION: Overall cognitive status: Within functional limits for tasks assessed   SENSATION: WFL  COORDINATION: Heel to shin: Slower on L, WFL on R Finger to nose: Slower on L with overshooting on first 2 reps, WFL on R  POSTURE: rounded shoulders, forward head, and weight shift right  LOWER EXTREMITY MMT:    MMT Right Eval Left Eval  Hip flexion 4- 3+  Hip extension    Hip abduction 5 5  Hip adduction 4+ 4+  Hip internal rotation    Hip external rotation     Knee flexion 5 4+  Knee extension 5 4+  Ankle dorsiflexion 5 4-  Ankle plantarflexion 5 4-  Ankle inversion    Ankle eversion    (Blank rows = not tested)  BED MOBILITY:  Not tested- pt reports being IND  TRANSFERS: Sit to stand: Modified independence  Assistive device utilized: None     Stand to sit: Modified independence  Assistive device utilized: None     Chair to chair: SBA  Assistive device utilized: None       STAIRS: Findings: Level of Assistance: CGA, Stair Negotiation Technique: Alternating Pattern  with Bilateral Rails, Number of Stairs: 4, Height of Stairs: 6   , and Comments: Pt with  decreased foot clearance bilaterally L>R , heavy UE support when descending GAIT: Findings: Gait Characteristics: step through pattern, decreased arm swing- Left, decreased step length- Left, decreased stance time- Left, decreased ankle dorsiflexion- Left, lateral lean- Left,  wide BOS, poor foot clearance- Right, and poor foot clearance- Left, Distance walked: approx 50 feet, Assistive device utilized:None, Level of assistance: CGA, and Comments: Ambulation for and into/out of clinic  FUNCTIONAL TESTS:  5 times sit-to-stand: 17.9 seconds, pt utilizing arms on knees to assist with standing : 0.56 m/s with 4WW and CGA; 0.73 m/s with no AD and CGA BERG Balance Scale: 33/56 FGA: 18/30 on 02/25/24 : 795 ft with 4WW on 02/25/24  PATIENT SURVEYS:  Stroke Impact Scale: 45/80                                                                                                                             TREATMENT DATE: 03/18/24  3# AW donned throughout session  -overground AMB c SPC x 1000 ft x 3# AW *sit break  -Step taps 3 x 10 with 3lb AW  - side step over hurdles 2 x 10 ea LE  - standing knee flexion 2 x 10 ea LE  Gat train:  Ladder drill forward gait with 1 foot in each ladder x 6 lengths  - lateral stepping with 2 feet in ea x 6 lengths   - Cues for picking up feet between each ladder wrong and not allowing any to overhead contact with individual rungs of ladder.  NMR:   Balance obstacle course consisting of step ladder, stepping onto and off of 7 inch step, stepping over hurdles, stepping over half foam rollers, stepping onto and off of [employer]ire[employer].  Times multiple repetitions for approximately 6 minutes  Unless otherwise stated, CGA was provided and gait belt donned in order to ensure pt safety   PATIENT EDUCATION: Education details: Pt educated on examination findings and thorough demonstration/explanation of new HEP provided during session  Encouraged to utilize [facility] for community  mobility, but decrease distance initially to ensure safety given increased physical and mental demand of gait with SPC vs rollator.   Person educated: Patient Education method: Explanation, Demonstration, and Handouts Education comprehension: verbalized understanding and returned demonstration  HOME EXERCISE PROGRAM: Access Code: [email or web address]  URL: [email or web address]  Date: 02/17/2024  Prepared by: Darryle Patten  Exercises: - Seated March  - 1 x daily - 3 x weekly - 3 sets - 10 reps  - Standing March with Counter Support  - 1 x daily - 3 x weekly - 3 sets - 10 reps  - Mini Squat with Counter Support  - 1 x daily - 3 x weekly - 3 sets - 10 reps  - Heel Toe Raises with Counter Support  - 1 x daily - 3 x weekly - 3 sets - 10 reps   GOALS: Goals reviewed with patient? Yes  SHORT TERM GOALS: Target date: 03/09/2024  Patient will be independent in home exercise program to improve strength/mobility for better functional independence with ADLs. Baseline: HEP reviewed during eval Goal status: INITIAL  LONG TERM GOALS: Target date: 05/11/2024  Patient will increase their Stroke Impact  Scale score by >10 points for improved function and independence Baseline: 45/80 Goal status: INITIAL  2.  Patient (< 57 years old) will complete five times sit to stand test in < 10 seconds indicating an increased LE strength and improved balance. Baseline: 17.9 seconds with UE support Goal status: INITIAL  3.  Patient will increase 10 meter walk test to >0.8 m/s as to improve gait speed for better community ambulation and to reduce fall risk. Baseline: 0.56 m/s with 4WW & 0.73 m/s with no AD  Goal status: INITIAL  4.  Patient will increase Berg Balance score by > 6 points to demonstrate decreased fall risk during functional activities. Baseline: 33/56 Goal status: INITIAL  5.  Patient will increase Functional Gait Assessment score to >20/30 as to reduce fall risk and improve dynamic gait  safety with community ambulation. Baseline:18/30 on 02/25/24 Goal status: INITIAL  6.  Patient will increase six minute walk test distance by >50 ft for progression to community ambulator and improve gait ability Baseline: 760ft with 4WW on 02/25/24; 03/11/24: 943ft c SPC  Goal status: INITIAL  ASSESSMENT:  CLINICAL IMPRESSION: Took a page from pt's job history book and began adding in some more high intensity, functional balance activity. Incorporated use of arm as able. Pt doe swell, but clearly is fatigued and takes advantage of recovery breaks for best performance overall. Patient will benefit from skilled physical therapy intervention to reduce deficits and impairments identified in evaluation, in order to reduce pain, improve quality of life, and maximize activity tolerance for ADL, IADL, and leisure/fitness. Physical therapy will help pt achieve long and short term goals of care.   OBJECTIVE IMPAIRMENTS: Abnormal gait, decreased activity tolerance, decreased balance, decreased coordination, decreased endurance, decreased mobility, difficulty walking, decreased ROM, decreased strength, hypomobility, impaired perceived functional ability, impaired flexibility, impaired UE functional use, improper body mechanics, and postural dysfunction.   ACTIVITY LIMITATIONS: carrying, lifting, bending, sitting, standing, squatting, stairs, transfers, bathing, reach over head, hygiene/grooming, and locomotion level  PARTICIPATION LIMITATIONS: meal prep, cleaning, laundry, driving, shopping, community activity, occupation, and yard work  PERSONAL FACTORS: Age and Fitness are also affecting patient's functional outcome.   REHAB POTENTIAL: Good  CLINICAL DECISION MAKING: Evolving/moderate complexity  EVALUATION COMPLEXITY: Moderate  PLAN:  PT FREQUENCY: 2x/week  PT DURATION: 12 weeks  PLANNED INTERVENTIONS: [id number]- PT Re-evaluation, 97750- Physical Performance Testing, 97110-Therapeutic exercises,  97530- Therapeutic activity, [id number]- Neuromuscular re-education, 97535- Self Care, [id number]- Manual therapy, [id number]- Gait training, [id number]- Orthotic Initial, [id number]- Orthotic/Prosthetic subsequent, [phone]- Canalith repositioning, [id number]- Splinting, [id number]- Electrical stimulation (manual), [id number]- Ultrasound, [id number]- Traction (mechanical), [phone] (1-2 muscles), [id number] (3+ muscles)- Dry Needling, Patient/Family education, Balance training, Stair training, Taping, Joint mobilization, Spinal mobilization, Vestibular training, Visual/preceptual remediation/compensation, Cognitive remediation, DME instructions, Cryotherapy, and Moist heat  PLAN FOR NEXT SESSION:   Continue LE strengthening, progress dynamic balance, weight shifting tasks to promote WB on L LE,  Dynamic gait training.   Lonni KATHEE Gainer PT ,DPT Physical Therapist- [facility]   9:52 AM 03/18/24   9:52 AM, 03/18/24

## 2024-03-22 ENCOUNTER — Ambulatory Visit: Attending: Family Medicine

## 2024-03-22 ENCOUNTER — Ambulatory Visit: Admitting: Physical Therapy

## 2024-03-22 DIAGNOSIS — R2681 Unsteadiness on feet: Secondary | ICD-10-CM | POA: Insufficient documentation

## 2024-03-22 DIAGNOSIS — M6281 Muscle weakness (generalized): Secondary | ICD-10-CM

## 2024-03-22 DIAGNOSIS — I639 Cerebral infarction, unspecified: Secondary | ICD-10-CM | POA: Diagnosis present

## 2024-03-22 DIAGNOSIS — R2689 Other abnormalities of gait and mobility: Secondary | ICD-10-CM | POA: Insufficient documentation

## 2024-03-22 DIAGNOSIS — R262 Difficulty in walking, not elsewhere classified: Secondary | ICD-10-CM | POA: Diagnosis present

## 2024-03-22 DIAGNOSIS — R278 Other lack of coordination: Secondary | ICD-10-CM

## 2024-03-22 DIAGNOSIS — R471 Dysarthria and anarthria: Secondary | ICD-10-CM | POA: Insufficient documentation

## 2024-03-22 NOTE — Therapy (Signed)
 OUTPATIENT PHYSICAL THERAPY TREATMENT  Patient Name: Mark Mcintosh MRN: [id number] DOB:[birth year], 53 y.o., male Today's Date: 03/22/2024  PCP: None REFERRING PROVIDER: [name], PA-C  END OF SESSION:   PT End of Session - 03/22/24 1103     Visit Number 8    Number of Visits 25    Date for PT Re-Evaluation 05/11/24    Progress Note Due on Visit 10    PT Start Time 1104    PT Stop Time 1142    PT Time Calculation (min) 38 min    Equipment Utilized During Treatment Gait belt    Activity Tolerance Patient tolerated treatment well;No increased pain    Behavior During Therapy [facility] for tasks assessed/performed           Past Medical History:  Diagnosis Date   Arthritis    Hypertension    Stroke (HCC) 01/09/2024   Stated he had 2 strokes.   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) 02/27/2024   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) 02/27/2024   Gait abnormality 02/27/2024   Left-sided weakness 02/27/2024   Prediabetes 02/27/2024   Abnormal pulse oximetry 02/27/2024   Vitamin D  deficiency 02/27/2024   Bradycardia 02/27/2024   Depression, major, single episode, moderate (HCC) 02/27/2024   Coping style affecting medical condition 01/20/2024   Right pontine cerebrovascular accident (HCC) 01/14/2024   Acute CVA (cerebrovascular accident) (HCC) 01/09/2024   Tobacco use  disorder 01/09/2024   Obesity (BMI 30-39.9) 01/09/2024   Hypertension     ONSET DATE: 01/09/24  REFERRING DIAG: I63.9 (ICD-10-CM) - Acute CVA (cerebrovascular accident) (HCC)   THERAPY DIAG:  Difficulty in walking, not elsewhere classified  Muscle weakness (generalized)  Unsteadiness on feet  Other lack of coordination  Other abnormalities of gait and mobility  Rationale for Evaluation and Treatment: Rehabilitation  SUBJECTIVE:  SUBJECTIVE STATEMENT:  Pt reports doing well today. States everything is going good. Pt denies any recent falls/stumbles. Pt states he feels like his L hand feels swollen and tight. Therapist educated that it may be associated with L arm in dependent position during gait throughout the day as pt states symptoms resolve at night. Pt reports that he primarily only uses his SPC out in the community, but doesn't use an AD at home.  Pt accompanied by: self  PERTINENT HISTORY: [facility] ED 01/09/24 for progressively worsening L sided-weakness. multiple acute infarcts concerning for embolic showering. PMH includes arthritis, HTN  PAIN:  Are you having pain? No  PRECAUTIONS: None  RED FLAGS: None   WEIGHT BEARING RESTRICTIONS: No  FALLS: Has patient fallen in last 6 months? Yes. Number of falls 1- pt reports that he tried to get up at home after returning from [place] and he got up too fast and fell. Pt also reports stumbling on steps at home  LIVING ENVIRONMENT: Lives with: lives alone- pt's son was staying with him until this past Friday- son lives in [place], pt has friends that come by every other day Lives in: House/apartment Stairs: Yes: External: 4 steps; can reach both Has following equipment at home: Walker - 4 wheeled and shower chair  PLOF: Independent- pt  works with mixing chemicals and dyes  PATIENT GOALS: Pt wants to get back to normal and be able to return to work  OBJECTIVE:  Note: Objective measures were completed at Evaluation unless otherwise noted.  DIAGNOSTIC FINDINGS: via chart  From 01/09/24 MRI Brain W/O Contrast: IMPRESSION: 1. Acute infarcts in the posterior limb of the right internal capsule, the overlying right frontal white matter, and pons. Given involvement of multiple vascular territories, consider an embolic etiology. 2. Multiple remote infarcts and chronic microvascular ischemic disease, detailed above.  From 01/10/24 CT Angio Head Neck W W/O CM: IMPRESSION: 1. No acute intracranial hemorrhage or mass effect. 2. No large vessel occlusion, hemodynamically significant stenosis, or aneurysm in the head or neck. 3. Remote lacunar infarcts in the genu of the left internal capsule, globus pallidus, and right corona radiata. 4. Acute infarct involving the posterior limb of the right internal capsule is less well appreciated at CT. 5. Moderately advanced periventricular white matter disease for age.  COGNITION: Overall cognitive status: Within functional limits for tasks assessed   SENSATION: WFL  COORDINATION: Heel to shin: Slower on L, WFL on R Finger to nose: Slower on L with overshooting on first 2 reps, WFL on R  POSTURE: rounded shoulders, forward head, and weight shift right  LOWER EXTREMITY MMT:    MMT Right Eval Left Eval  Hip flexion 4- 3+  Hip extension    Hip abduction 5 5  Hip adduction 4+ 4+  Hip internal rotation    Hip external rotation     Knee flexion 5 4+  Knee extension 5 4+  Ankle dorsiflexion 5 4-  Ankle plantarflexion 5 4-  Ankle inversion    Ankle eversion    (Blank rows = not tested)  BED MOBILITY:  Not tested- pt reports being IND  TRANSFERS: Sit to stand: Modified independence  Assistive device utilized: None     Stand to sit: Modified independence  Assistive  device utilized: None     Chair to chair: SBA  Assistive device utilized: None       STAIRS: Findings: Level of Assistance: CGA, Stair Negotiation Technique: Alternating Pattern  with Bilateral Rails, Number of Stairs:  4, Height of Stairs: 6   , and Comments: Pt with decreased foot clearance bilaterally L>R , heavy UE support when descending GAIT: Findings: Gait Characteristics: step through pattern, decreased arm swing- Left, decreased step length- Left, decreased stance time- Left, decreased ankle dorsiflexion- Left, lateral lean- Left, wide BOS, poor foot clearance- Right, and poor foot clearance- Left, Distance walked: approx 50 feet, Assistive device utilized:None, Level of assistance: CGA, and Comments: Ambulation for and into/out of clinic  FUNCTIONAL TESTS:  5 times sit-to-stand: 17.9 seconds, pt utilizing arms on knees to assist with standing : 0.56 m/s with 4WW and CGA; 0.73 m/s with no AD and CGA BERG Balance Scale: 33/56 FGA: 18/30 on 02/25/24 : 795 ft with 4WW on 02/25/24  PATIENT SURVEYS:  Stroke Impact Scale: 45/80                                                                                                                             TREATMENT DATE: 03/22/24  Unless otherwise stated, CGA was provided and gait belt donned in order to ensure pt safety throughout session.  Higher intensity gait training ~352ft forward, no AD, with CGA for safety Cuing for fast forward walking Increased arm swing  Pt maintains reciprocal stepping pattern throughout Noticed intermittent slight decreased L LE foot clearance with fatigue   Gait training ~116ft backwards, no AD, with CGA/light min A Cuing for sudden stops to ensure patient has good control of backwards momentum  Pt able to achieve reciprocal stepping pattern throughout  Donned 5lb AW to L LE for the following   Dynamic gait training using agility ladder including the following:  forward reciprocal stepping  pattern  side stepping  Slight L LOB initially but improved on 2nd set forward 2 feet in/2 feet out skipping a square  Backwards reciprocal pattern Pt having difficult keeping R wt shift and lifting L LE to bring it backwards Requires heavy skilled min A for balance due to L anterior lean/LOB L turns through agility ladder, transitioned to R turns through agility ladder   Dynamic gait training including: Backwards figure 8 walking through 2 cones Light min A for balance Forward/backwards reciprocal stepping pattern over 2x hurdles X10reps with skilled min A for balance due to L anterior lean/LOB bias This was very challenging for patient to adequately clear hurdle in both directions   PATIENT EDUCATION: Education details: Pt educated on examination findings and thorough demonstration/explanation of new HEP provided during session  Encouraged to utilize [facility] for community mobility, but decrease distance initially to ensure safety given increased physical and mental demand of gait with SPC vs rollator.   Person educated: Patient Education method: Explanation, Demonstration, and Handouts Education comprehension: verbalized understanding and returned demonstration  HOME EXERCISE PROGRAM: Access Code: [email or web address]  URL: [email or web address]  Date: 02/17/2024  Prepared by: Darryle Patten  Exercises: - Seated March  - 1 x daily - 3 x weekly - 3 sets - 10 reps  -  Standing March with Counter Support  - 1 x daily - 3 x weekly - 3 sets - 10 reps  - Mini Squat with Counter Support  - 1 x daily - 3 x weekly - 3 sets - 10 reps  - Heel Toe Raises with Counter Support  - 1 x daily - 3 x weekly - 3 sets - 10 reps   GOALS: Goals reviewed with patient? Yes  SHORT TERM GOALS: Target date: 03/09/2024  Patient will be independent in home exercise program to improve strength/mobility for better functional independence with ADLs. Baseline: HEP reviewed during eval Goal status:  INITIAL  LONG TERM GOALS: Target date: 05/11/2024  Patient will increase their Stroke Impact Scale score by >10 points for improved function and independence Baseline: 45/80 Goal status: INITIAL  2.  Patient (< 13 years old) will complete five times sit to stand test in < 10 seconds indicating an increased LE strength and improved balance. Baseline: 17.9 seconds with UE support Goal status: INITIAL  3.  Patient will increase 10 meter walk test to >0.8 m/s as to improve gait speed for better community ambulation and to reduce fall risk. Baseline: 0.56 m/s with 4WW & 0.73 m/s with no AD  Goal status: INITIAL  4.  Patient will increase Berg Balance score by > 6 points to demonstrate decreased fall risk during functional activities. Baseline: 33/56 Goal status: INITIAL  5.  Patient will increase Functional Gait Assessment score to >20/30 as to reduce fall risk and improve dynamic gait safety with community ambulation. Baseline:18/30 on 02/25/24 Goal status: INITIAL  6.  Patient will increase six minute walk test distance by >50 ft for progression to community ambulator and improve gait ability Baseline: 729ft with 4WW on 02/25/24; 03/11/24: 936ft c SPC  Goal status: INITIAL  ASSESSMENT:  CLINICAL IMPRESSION:  Therapy session continued to progress higher level dynamic gait training with use of an AW for increased intensity as well as various obstacles to navigate over/around for dynamic stepping challenge. Patient with greatest challenge performing reciprocal forward/backwards stepping over 2 hurdles and will benefit from continuation and progression of this intervention. Patient highly motivated to regain full independence and return to PLOF. Patient will benefit from skilled physical therapy intervention to reduce deficits and impairments identified in evaluation, in order to reduce pain, improve quality of life, and maximize activity tolerance for ADL, IADL, and leisure/fitness. Physical  therapy will help pt achieve long and short term goals of care.   OBJECTIVE IMPAIRMENTS: Abnormal gait, decreased activity tolerance, decreased balance, decreased coordination, decreased endurance, decreased mobility, difficulty walking, decreased ROM, decreased strength, hypomobility, impaired perceived functional ability, impaired flexibility, impaired UE functional use, improper body mechanics, and postural dysfunction.   ACTIVITY LIMITATIONS: carrying, lifting, bending, sitting, standing, squatting, stairs, transfers, bathing, reach over head, hygiene/grooming, and locomotion level  PARTICIPATION LIMITATIONS: meal prep, cleaning, laundry, driving, shopping, community activity, occupation, and yard work  PERSONAL FACTORS: Age and Fitness are also affecting patient's functional outcome.   REHAB POTENTIAL: Good  CLINICAL DECISION MAKING: Evolving/moderate complexity  EVALUATION COMPLEXITY: Moderate  PLAN:  PT FREQUENCY: 2x/week  PT DURATION: 12 weeks  PLANNED INTERVENTIONS: [id number]- PT Re-evaluation, 97750- Physical Performance Testing, 97110-Therapeutic exercises, 97530- Therapeutic activity, [id number]- Neuromuscular re-education, 97535- Self Care, [id number]- Manual therapy, [id number]- Gait training, [id number]- Orthotic Initial, [id number]- Orthotic/Prosthetic subsequent, [phone]- Canalith repositioning, [id number]- Splinting, [id number]- Electrical stimulation (manual), [id number]- Ultrasound, [id number]- Traction (mechanical), [id number] (1-2 muscles), [id number] (3+ muscles)- Dry Needling, Patient/Family education, Balance training, Stair training, Taping,  Joint mobilization, Spinal mobilization, Vestibular training, Visual/preceptual remediation/compensation, Cognitive remediation, DME instructions, Cryotherapy, and Moist heat  PLAN FOR NEXT SESSION:  - advance HEP program Continue LE strengthening, progress dynamic balance, weight shifting tasks to promote WB on L LE,  Dynamic gait training Backwards gait Quick turning in different  directions Reciprocal forward/backward stepping over Hurdles    Merideth Bosque, PT, DPT, NCS, CSRS Physical Therapist - [facility]  [facility]  11:44 AM 03/22/24

## 2024-03-22 NOTE — Therapy (Unsigned)
 OUTPATIENT OCCUPATIONAL THERAPY NEURO TREATMENT NOTE  Patient Name: Mark Mcintosh MRN: [id number] DOB:1971/08/15, 53 y.o., male Today's Date: 03/23/2024  PCP: No PCP REFERRING PROVIDER: [name], PA-C  END OF SESSION:   OT End of Session - 03/23/24 0859     Visit Number 7    Number of Visits 24    Date for OT Re-Evaluation 05/11/24    OT Start Time 1145    OT Stop Time 1230    OT Time Calculation (min) 45 min    Activity Tolerance Patient tolerated treatment well    Behavior During Therapy [facility] for tasks assessed/performed              Past Medical History:  Diagnosis Date   Arthritis    Hypertension    Stroke (HCC) 01/09/2024   Stated he had 2 strokes.   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) 02/27/2024   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) 02/27/2024   Gait abnormality 02/27/2024   Left-sided weakness 02/27/2024   Prediabetes 02/27/2024   Abnormal pulse oximetry 02/27/2024   Vitamin D  deficiency 02/27/2024   Bradycardia 02/27/2024   Depression, major, single episode, moderate (HCC) 02/27/2024   Coping style affecting medical condition 01/20/2024   Right pontine cerebrovascular accident (HCC) 01/14/2024   Acute CVA (cerebrovascular accident) (HCC) 01/09/2024   Tobacco use disorder 01/09/2024   Obesity (BMI 30-39.9) 01/09/2024   Hypertension    ONSET  DATE: 01/09/24  REFERRING DIAG: I63.9 (ICD-10-CM) - Acute CVA (cerebrovascular accident) (HCC)   THERAPY DIAG:  Muscle weakness (generalized)  Other lack of coordination  Acute CVA (cerebrovascular accident) (HCC)  Rationale for Evaluation and Treatment: Rehabilitation  SUBJECTIVE:  SUBJECTIVE STATEMENT: I'm using my L hand a lot but it still seems tight and maybe a little swollen at times.  Pt accompanied by: self  PERTINENT HISTORY: Per chart:  Mark Mcintosh is a 53 y.o. right-handed male with history significant for bilateral conjunctival inflammation hypertension as well as tobacco use and class II morbid obesity with BMI 36.26.  Per chart review patient lives alone independent prior to admission.  [facility]ed to [facility]09/[facility]24 with acute onset of left-sided weakness.  MRI showed acute infarct in the posterior limb of the right internal capsule, the overlying right frontal white matter and pons.  Multiple remote infarcts and chronic microvascular ischemic disease.  CTA showed no large vessel occlusion.  Admission chemistries unremarkable except potassium 3.1.  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  TEE with small PFO per cardiology services with no plan for closure.  Neurology follow-up placed on aspirin  and Plavix  for CVA prophylaxis x 3 weeks then aspirin  alone.  Lovenox  for DVT prophylaxis but bilateral Doppler studies negative.  Therapy evaluations completed due to patient decreased functional mobility left-sided weakness was admitted for a comprehensive rehab program.   PRECAUTIONS: Fall  WEIGHT BEARING RESTRICTIONS: No  PAIN: No pain, just numbness in the  hand  Are you having pain? 0/10  FALLS: Has patient fallen in last 6 months? Yes. 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  Get back my independence.    OBJECTIVE:  Note: Objective measures were completed at Evaluation unless otherwise noted.  HAND DOMINANCE: Right  ADLs: Overall ADLs: Son was staying with pt for about a week after d/c from inpatient rehab, but has since returned home.  Friend comes by every other day to check in.   Transfers/ambulation related to ADLs: modified indep with rollator Eating: increased difficulty with cutting food  Grooming: increased difficulty with clipping nails, otherwise manages fine with dominant/unaffected hand UB Dressing: increased time with clothing fasteners LB Dressing: increased time with clothing fasteners  Toileting: increased time to engage L hand into clothing management Bathing: pt sits on shower chair to wash LEs, some SOB with standing in shower Tub Shower transfers: Modified indep Equipment: Shower seat without back  IADLs: Shopping: motorized cart, can push cart for shorter shopping trips.  Light housekeeping: extra time and cautions to avoid falls, sometimes feels SOB with laundry  Meal Prep: friend helps with stove top cooking, pt can do light hot and cold meal prep  Community mobility: rollator, friend drove pt  Medication management: indep  Landscape architect: indep  Handwriting: NT; L non-dominant hand affected   MOBILITY STATUS: Hx of falls, rollator or funiture/wall walking in the home, rollator for community  POSTURE COMMENTS:  L sided hemiparesis   ACTIVITY TOLERANCE: Activity tolerance: Pt reports some dyspnea with prolonged standing/mobility  UPPER EXTREMITY ROM:  BUEs WFL  UPPER EXTREMITY MMT:     MMT Right eval Left eval  Shoulder flexion 5 4-  Shoulder abduction 5 4-  Shoulder adduction    Shoulder extension    Shoulder  internal rotation    Shoulder external rotation    Middle trapezius    Lower trapezius    Elbow flexion 5 4-  Elbow extension 5 4-  Wrist flexion 5 4-  Wrist extension 5 4-  Wrist ulnar deviation    Wrist radial deviation    Wrist pronation 5 4-  Wrist supination 5 4-  (Blank rows = not tested)  HAND FUNCTION: Grip strength: Right: 111 lbs; Left: 51 lbs, Lateral pinch: Right: 23 lbs, Left: 13 lbs, and 3 point pinch: Right: 26 lbs, Left: 13 lbs  COORDINATION: Finger Nose Finger test: increased time and decreased accuracy on the L  9 Hole Peg test: Right: 25 sec; Left: 1 min 27 sec  SENSATION: Pt reports increased tingling/numbness when hand balls up a little, but when he straightens it out it goes back to normal.   EDEMA: no visible edema   MUSCLE TONE: LUE: Mild   COGNITION: Overall cognitive status: Within functional limits for tasks assessed pt reportme difficulties with memory   VISION:             Subjective report: Wears glasses all the time.  Taking new medication for inflammation of bilat conjuctiva (prescribed prior to CVA)  VISION ASSESSMENT: Tracking/Visual pursuits: Able to  track stimulus in all quads without difficulty Saccades: additional head turns occurred during testing Visual Fields: no apparent deficits  PERCEPTION: Not tested  PRAXIS: Impaired: Motor planning; mild-moderate apraxia and ataxia throughout the LUE  OBSERVATIONS:  Pt pleasant, cooperative, and appears eager to work towards OT goals.                                                                                                             TREATMENT DATE: 03/22/24 Self Care: -Assessed L hand for edema d/t reported feeling of tightness and swelling; mild swelling observed based on diminished fine lines on L hand digits as compared to R hand.  Issued open fingered compression glove and advised on recommended wearing schedule and care of garment.   Therapeutic Exercise: -L hand grip  strengthening with use of hand gripper at moderate resistance with 3 red bands; 3 sets 10 reps beginning of session, 2 sets 10 reps end of session; min vc for increasing flexion range with each gripping rep -Dowel climb on table top for increasing L shoulder flexion strength and flexibility x5 reps up/down, 10 sec hold at top  Therapeutic Activity: -Facilitated LUE forward and lateral reaching toward a target in combination with increasing lateral and 3 point pinch strengthening working to move therapy resistant clothespins (all colors) on/off vertical and horizontal dowels.  -Facilitated L forearm, wrist, and hand strengthening/combination movements with participation in EZ board tools.  Pt worked with long handled tool to facilitate L wrist flex/ext and forearm pron/sup, small and large base key turn, and small and large dial turn x3 reps for each tool (up/down board=1 rep).  Rest breaks between sets and min vc for technique to increase control.  Constant supv/intermittent set up assist to maintain tools level on velcro strip.    PATIENT EDUCATION: Education details: L hand edema management Person educated: Patient Education method: Explanation and Verbal cues Education comprehension: verbalized understanding  HOME EXERCISE PROGRAM: Landy fake, [facility] handout  GOALS: Goals reviewed with patient? Yes  SHORT TERM GOALS: Target date: 03/30/24  Pt will be indep to perform HEP for increasing strength and coordination throughout the LUE. Baseline: Eval: Not yet initiated Goal status: INITIAL  LONG TERM GOALS: Target date: 05/11/24  Pt will increase LUE MMT by 1/2 grade or more to increase engagement of LUE into ADL/IADLs. Baseline: Eval: LUE grossly 4-/5 throughout (R 5/5) Goal status: INITIAL  2.  Pt will increase L grip strength by 20 lbs or more to enable pt to grasp and carry heavy ADL supplies in L hand. Baseline: Eval: L 51 lbs (R 111) Goal status: INITIAL  3.  Pt will increase  L hand [facility] skills as demonstrated by completion of 9 hole peg test in <1 min to improve manipulation of small ADL supplies. Baseline: Eval: L 1 min 27 sec (R 25 sec) Goal status: INITIAL  4.  Pt will increase LUE [facility] to enable confidence with moving hot pots/pans on/off stove top and in/out of the oven using BUEs. Baseline: Eval: Pt reports the  his friend currently assists with heavier meal prep Goal status: INITIAL  ASSESSMENT:  CLINICAL IMPRESSION: Mild edema reported and observed in L hand digits; issued compression glove with pt verbalizing good comfort and understanding of wearing schedule and care of glove.  Pt reports continued control of LUE, noting more accurate hand to face patterns for eating and washing face.  Pt continues to present with moderate ataxia when reaching toward a target with an outstretched arm.  Pt is progressing with L grip/pinch strength, noting ability to tolerate 3 bands on hand gripper today with rest breaks between sets, and good ability to manage all colors of therapy resistant clothespins, though stronger colors (blue/black) are pinched part way through entire range.  Pt will continue to benefit from continued skilled OT to work towards above noted goals for improving indep with daily tasks, return to work, reduce burden of care on caregivers, and improve QOL.    PERFORMANCE DEFICITS: in functional skills including ADLs, IADLs, coordination, dexterity, sensation, tone, strength, Fine motor control, Gross motor control, mobility, balance, body mechanics, endurance, decreased knowledge of precautions, decreased knowledge of use of DME, vision, and UE functional use, cognitive skills including memory, and psychosocial skills including coping strategies, environmental adaptation, and routines and behaviors.   IMPAIRMENTS: are limiting patient from ADLs, IADLs, work, and leisure.   CO-MORBIDITIES: has co-morbidities such as tobacco use disorder, HTN, arthritis that  affects occupational performance. Patient will benefit from skilled OT to address above impairments and improve overall function.  MODIFICATION OR ASSISTANCE TO COMPLETE EVALUATION: No modification of tasks or assist necessary to complete an evaluation.  OT OCCUPATIONAL PROFILE AND HISTORY: Detailed assessment: Review of records and additional review of physical, cognitive, psychosocial history related to current functional performance.  CLINICAL DECISION MAKING: Moderate - several treatment options, min-mod task modification necessary  REHAB POTENTIAL: Good  EVALUATION COMPLEXITY: Moderate    PLAN:  OT FREQUENCY: 2x/week  OT DURATION: 12 weeks  PLANNED INTERVENTIONS: 97168 OT Re-evaluation, 97535 self care/ADL training, 02889 therapeutic exercise, 97530 therapeutic activity, 97112 neuromuscular re-education, 97140 manual therapy, 97116 gait training, 02989 moist heat, 97010 cryotherapy, 97750 Physical Performance Testing, passive range of motion, balance training, functional mobility training, visual/perceptual remediation/compensation, psychosocial skills training, energy conservation, coping strategies training, patient/family education, and DME and/or AE instructions  RECOMMENDED OTHER SERVICES: SLP eval and treat d/t pt reporting memory deficits since CVA, drooling, and slurred speech  CONSULTED AND AGREED WITH PLAN OF CARE: Patient  PLAN FOR NEXT SESSION: see above   Inocente Blazing, MS, OTR/L   03/23/2024, 9:00 AM

## 2024-03-23 ENCOUNTER — Encounter: Admitting: Speech Pathology

## 2024-03-23 ENCOUNTER — Encounter: Admitting: Physical Medicine & Rehabilitation

## 2024-03-23 ENCOUNTER — Encounter

## 2024-03-23 ENCOUNTER — Ambulatory Visit: Admitting: Physical Therapy

## 2024-03-25 ENCOUNTER — Ambulatory Visit: Admitting: Speech Pathology

## 2024-03-25 ENCOUNTER — Ambulatory Visit: Admitting: Physical Therapy

## 2024-03-25 ENCOUNTER — Ambulatory Visit

## 2024-03-25 DIAGNOSIS — I639 Cerebral infarction, unspecified: Secondary | ICD-10-CM

## 2024-03-25 DIAGNOSIS — M6281 Muscle weakness (generalized): Secondary | ICD-10-CM

## 2024-03-25 DIAGNOSIS — R2689 Other abnormalities of gait and mobility: Secondary | ICD-10-CM

## 2024-03-25 DIAGNOSIS — R471 Dysarthria and anarthria: Secondary | ICD-10-CM

## 2024-03-25 DIAGNOSIS — R262 Difficulty in walking, not elsewhere classified: Secondary | ICD-10-CM

## 2024-03-25 DIAGNOSIS — R2681 Unsteadiness on feet: Secondary | ICD-10-CM

## 2024-03-25 DIAGNOSIS — R278 Other lack of coordination: Secondary | ICD-10-CM

## 2024-03-25 NOTE — Therapy (Signed)
 OUTPATIENT PHYSICAL THERAPY TREATMENT  Patient Name: Mark Mcintosh MRN: [id number] DOB:1971/06/15, 53 y.o., male Today's Date: 03/25/2024  PCP: None REFERRING PROVIDER: [name], PA-C  END OF SESSION:   PT End of Session - 03/25/24 1029     Visit Number 9    Number of Visits 25    Date for PT Re-Evaluation 05/11/24    Progress Note Due on Visit 10    PT Start Time 1100    PT Stop Time 1140    PT Time Calculation (min) 40 min    Equipment Utilized During Treatment Gait belt    Activity Tolerance Patient tolerated treatment well;No increased pain    Behavior During Therapy [facility] for tasks assessed/performed            Past Medical History:  Diagnosis Date   Arthritis    Hypertension    Stroke (HCC) 01/09/2024   Stated he had 2 strokes.   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) 02/27/2024   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) 02/27/2024   Gait abnormality 02/27/2024   Left-sided weakness 02/27/2024   Prediabetes 02/27/2024   Abnormal pulse oximetry 02/27/2024   Vitamin D  deficiency 02/27/2024   Bradycardia 02/27/2024   Depression, major, single episode, moderate (HCC) 02/27/2024   Coping style affecting medical condition 01/20/2024   Right pontine cerebrovascular accident (HCC) 01/14/2024   Acute CVA (cerebrovascular accident) (HCC) 01/09/2024   Tobacco use  disorder 01/09/2024   Obesity (BMI 30-39.9) 01/09/2024   Hypertension     ONSET DATE: 01/09/24  REFERRING DIAG: I63.9 (ICD-10-CM) - Acute CVA (cerebrovascular accident) (HCC)   THERAPY DIAG:  Difficulty in walking, not elsewhere classified  Muscle weakness (generalized)  Unsteadiness on feet  Other lack of coordination  Other abnormalities of gait and mobility  Rationale for Evaluation and Treatment: Rehabilitation  SUBJECTIVE:  SUBJECTIVE STATEMENT:  Pt reports doing well today. Pt denies any recent falls/stumbles since prior session. Pt denies any updates to medications or medical appointment since prior session. Pt reports good compliance with HEP when time permits.    Pt accompanied by: self  PERTINENT HISTORY: [facility] ED 01/09/24 for progressively worsening L sided-weakness. multiple acute infarcts concerning for embolic showering. PMH includes arthritis, HTN  PAIN:  Are you having pain? No  PRECAUTIONS: None  RED FLAGS: None   WEIGHT BEARING RESTRICTIONS: No  FALLS: Has patient fallen in last 6 months? Yes. Number of falls 1- pt reports that he tried to get up at home after returning from [facility] and he got up too fast and fell. 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  DIAGNOSTIC FINDINGS: via chart  From 01/09/24 MRI Brain W/O Contrast: IMPRESSION: 1. Acute infarcts in the posterior limb of the right internal capsule, the overlying right frontal white matter, and pons. Given involvement of multiple vascular territories, consider an embolic etiology. 2. Multiple remote infarcts and chronic microvascular ischemic disease, detailed above.  From 01/10/24 CT Angio Head Neck W W/O CM: IMPRESSION: 1. No acute intracranial hemorrhage or mass effect. 2. No large vessel occlusion, hemodynamically significant stenosis, or aneurysm in the head or neck. 3. Remote lacunar infarcts in the genu of the left internal capsule, globus pallidus, and right corona radiata. 4. Acute infarct involving the posterior limb of the right internal capsule is less well appreciated at CT. 5. Moderately advanced periventricular white matter disease for age.  COGNITION: Overall cognitive status: Within functional limits for tasks assessed   SENSATION: WFL  COORDINATION: Heel to shin: Slower on L, WFL on R Finger to nose: Slower on L with overshooting on first 2 reps, WFL on R  POSTURE: rounded shoulders, forward head, and weight shift right  LOWER EXTREMITY MMT:    MMT Right Eval Left Eval  Hip flexion 4- 3+  Hip extension    Hip abduction 5 5  Hip adduction 4+ 4+  Hip internal rotation    Hip external rotation     Knee flexion 5 4+  Knee extension 5 4+  Ankle dorsiflexion 5 4-  Ankle plantarflexion 5 4-  Ankle inversion    Ankle eversion    (Blank rows = not tested)  BED MOBILITY:  Not tested- pt reports being IND  TRANSFERS: Sit to stand: Modified independence  Assistive device utilized: None     Stand to sit: Modified independence  Assistive device utilized: None     Chair to chair: SBA  Assistive device utilized: None       STAIRS: Findings: Level of Assistance: CGA, Stair Negotiation Technique: Alternating Pattern  with  Bilateral Rails, Number of Stairs: 4, Height of Stairs: 6   , and Comments: Pt with decreased foot clearance bilaterally L>R , heavy UE support when descending GAIT: Findings: Gait Characteristics: step through pattern, decreased arm swing- Left, decreased step  length- Left, decreased stance time- Left, decreased ankle dorsiflexion- Left, lateral lean- Left, wide BOS, poor foot clearance- Right, and poor foot clearance- Left, Distance walked: approx 50 feet, Assistive device utilized:None, Level of assistance: CGA, and Comments: Ambulation for and into/out of clinic  FUNCTIONAL TESTS:  5 times sit-to-stand: 17.9 seconds, pt utilizing arms on knees to assist with standing : 0.56 m/s with 4WW and CGA; 0.73 m/s with no AD and CGA BERG Balance Scale: 33/56 FGA: 18/30 on 02/25/24 : 795 ft with 4WW on 02/25/24  PATIENT SURVEYS:  Stroke Impact Scale: 45/80                                                                                                                             TREATMENT DATE: 03/25/24  Unless otherwise stated, CGA was provided and gait belt donned in order to ensure pt safety throughout session.  Gait training   High intensity gait training x several min with 5# AW B and with intermittent stop, retro gait, and turns   Backwards gait through 6 cones weaving x 3 laps  *1 lap = 1 time each way to return to start point  NMR: To facilitate reeducation of movement, balance, posture, coordination, and/or proprioception/kinesthetic sense.  Balance obstacle course going forwards through cones, airex and step trainer then returning to start doing all the above with retro gait  X 3 laps   Sidestepping obstacle course with hurdles, airex, 1/2 foam, step trainer  -2 x 4 laps  - 1 LOB stepping off step trainer to the left; corrected with skilled min A   Forward/backwards reciprocal stepping pattern over 2x hurdles X10reps  This was very challenging for patient to  adequately clear hurdle post with LLE in particular this date   R LE stationary then LLE stepping over and back of hurdle 2 x 10 reps   - with final few reps and fatigue pt having more difficulty with balance   TE- To improve strength, endurance, mobility, and function of specific targeted muscle groups or improve joint range of motion or improve muscle flexibility  LAQ 2 x 10 with 5# AW  HS curl with GTB x 10 ea   PATIENT EDUCATION: Education details: Pt educated on examination findings and thorough demonstration/explanation of new HEP provided during session  Encouraged to utilize [facility] for community mobility, but decrease distance initially to ensure safety given increased physical and mental demand of gait with SPC vs rollator.   Person educated: Patient Education method: Explanation, Demonstration, and Handouts Education comprehension: verbalized understanding and returned demonstration  HOME EXERCISE PROGRAM: Access Code: [email or web address]  URL: [email or web address]  Date: 02/17/2024  Prepared by: Darryle Patten  Exercises: - Seated March  - 1 x daily - 3 x weekly - 3 sets - 10 reps  - Standing March with Counter Support  - 1 x daily - 3 x weekly - 3 sets - 10 reps  - Mini Squat with Counter  Support  - 1 x daily - 3 x weekly - 3 sets - 10 reps  - Heel Toe Raises with Counter Support  - 1 x daily - 3 x weekly - 3 sets - 10 reps   GOALS: Goals reviewed with patient? Yes  SHORT TERM GOALS: Target date: 03/09/2024  Patient will be independent in home exercise program to improve strength/mobility for better functional independence with ADLs. Baseline: HEP reviewed during eval Goal status: INITIAL  LONG TERM GOALS: Target date: 05/11/2024  Patient will increase their Stroke Impact Scale score by >10 points for improved function and independence Baseline: 45/80 Goal status: INITIAL  2.  Patient (< 8 years old) will complete five times sit to stand test in < 10 seconds  indicating an increased LE strength and improved balance. Baseline: 17.9 seconds with UE support Goal status: INITIAL  3.  Patient will increase 10 meter walk test to >0.8 m/s as to improve gait speed for better community ambulation and to reduce fall risk. Baseline: 0.56 m/s with 4WW & 0.73 m/s with no AD  Goal status: INITIAL  4.  Patient will increase Berg Balance score by > 6 points to demonstrate decreased fall risk during functional activities. Baseline: 33/56 Goal status: INITIAL  5.  Patient will increase Functional Gait Assessment score to >20/30 as to reduce fall risk and improve dynamic gait safety with community ambulation. Baseline:18/30 on 02/25/24 Goal status: INITIAL  6.  Patient will increase six minute walk test distance by >50 ft for progression to community ambulator and improve gait ability Baseline: 721ft with 4WW on 02/25/24; 03/11/24: 969ft c SPC  Goal status: INITIAL  ASSESSMENT:  CLINICAL IMPRESSION:  Patient arrived with good motivation for completion of pt activities.  Pt challenged with dynamic strength and balance activities this date. Pt has most trouble with retro stepping over hurdles particularly with LLE coordination. Pt will continue to benefit from skilled physical therapy intervention to address impairments, improve QOL, and attain therapy goals.    OBJECTIVE IMPAIRMENTS: Abnormal gait, decreased activity tolerance, decreased balance, decreased coordination, decreased endurance, decreased mobility, difficulty walking, decreased ROM, decreased strength, hypomobility, impaired perceived functional ability, impaired flexibility, impaired UE functional use, improper body mechanics, and postural dysfunction.   ACTIVITY LIMITATIONS: carrying, lifting, bending, sitting, standing, squatting, stairs, transfers, bathing, reach over head, hygiene/grooming, and locomotion level  PARTICIPATION LIMITATIONS: meal prep, cleaning, laundry, driving, shopping, community  activity, occupation, and yard work  PERSONAL FACTORS: Age and Fitness are also affecting patient's functional outcome.   REHAB POTENTIAL: Good  CLINICAL DECISION MAKING: Evolving/moderate complexity  EVALUATION COMPLEXITY: Moderate  PLAN:  PT FREQUENCY: 2x/week  PT DURATION: 12 weeks  PLANNED INTERVENTIONS: 97164- PT Re-evaluation, 97750- Physical Performance Testing, 97110-Therapeutic exercises, 97530- Therapeutic activity, [id number]- Neuromuscular re-education, 97535- Self Care, 02859- Manual therapy, [id number]- Gait training, [id number]- Orthotic Initial, [id number]- Orthotic/Prosthetic subsequent, [phone]- Canalith repositioning, [id number]- Splinting, [id number]- Electrical stimulation (manual), [id number]- Ultrasound, [id number]- Traction (mechanical), [phone] (1-2 muscles), [id number] (3+ muscles)- Dry Needling, Patient/Family education, Balance training, Stair training, Taping, Joint mobilization, Spinal mobilization, Vestibular training, Visual/preceptual remediation/compensation, Cognitive remediation, DME instructions, Cryotherapy, and Moist heat  PLAN FOR NEXT SESSION:  - advance HEP program Continue LE strengthening, progress dynamic balance, weight shifting tasks to promote WB on L LE,  Dynamic gait training Backwards gait Quick turning in different directions Reciprocal forward/backward stepping over Hurdles    Note: Portions of this document were prepared using Dragon voice recognition software and although reviewed may contain unintentional dictation errors in  syntax, grammar, or spelling.  Lonni KATHEE Gainer PT ,DPT Physical Therapist- [facility]   10:29 AM 03/25/24

## 2024-03-25 NOTE — Therapy (Signed)
 OUTPATIENT SPEECH LANGUAGE PATHOLOGY  SWALLOW and DYSARTHRIA TREATMENT NOTE   Patient Name: Mark Mcintosh MRN: [id number] DOB:1971-01-11, 53 y.o., male Today's Date: 03/25/2024  PCP: Curtis Boom, FNP REFERRING PROVIDER: Fidela Ned, NP   End of Session - 03/25/24 1251     Visit Number 5    Number of Visits 17    Date for SLP Re-Evaluation 04/27/24    Authorization Type [employer]    Progress Note Due on Visit 10    SLP Start Time 1020    SLP Stop Time  1100    SLP Time Calculation (min) 40 min    Activity Tolerance Patient tolerated treatment well          Past Medical History:  Diagnosis Date   Arthritis    Hypertension    Stroke (HCC) 01/09/2024   Stated he had 2 strokes.   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) 02/27/2024   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) 02/27/2024   Gait abnormality 02/27/2024   Left-sided weakness 02/27/2024   Prediabetes 02/27/2024   Abnormal pulse oximetry 02/27/2024   Vitamin D  deficiency 02/27/2024   Bradycardia 02/27/2024   Depression, major, single episode, moderate (HCC) 02/27/2024   Coping style affecting medical condition 01/20/2024   Right pontine cerebrovascular accident (HCC) 01/14/2024   Acute CVA (cerebrovascular accident) (HCC) 01/09/2024   Tobacco use disorder 01/09/2024   Obesity (BMI 30-39.9) 01/09/2024    Hypertension     ONSET DATE: 01/09/2024   REFERRING DIAG:  R47.1 (ICD-10-CM) - Dysarthria  R13.10 (ICD-10-CM) - Dysphagia    THERAPY DIAG:  Dysarthria and anarthria  Rationale for Evaluation and Treatment Rehabilitation  SUBJECTIVE:   PERTINENT HISTORY and DIAGNOSTIC FINDINGS: Pt is a right handed 53 year old male with past medical history of hypertension and tobacco use disorder. He presented to [facility] ED on 01/09/2024 with left sided weakness. MRI on 01/10/2024 revealed 1. Acute infarcts in the posterior limb of the right internal  capsule, the overlying right frontal white matter, and pons. Given  involvement of multiple vascular territories, consider an embolic  etiology.  2. Multiple remote infarcts and chronic microvascular ischemic  disease. Pt received ST services at [facility] from 01/16/2024 thru 01/29/2024.   PAIN:  Are you having pain? No  FALLS: Has patient fallen in last 6 months?  No  LIVING ENVIRONMENT: Lives with: lives alone Lives in: House/apartment  PLOF:  Level of assistance: Independent with ADLs, Independent with IADLs Employment: Full-time employment  SUBJECTIVE STATEMENT: Pt pleasant, reports continued improvement/progress Pt accompanied by: self  PATIENT GOALS  to improve speech and swallow abilities  OBJECTIVE:   TODAY'S TREATMENT NOTE: Skilled treatment session targeted pt's dysarthria and dysphagia goals. SLP facilitated session by providing the following interventions:  He mentions he forgot his EMST but reports he continues to practice. Additionally reports that it is easier to blow into device. Recommend turning half turn  and attempting.   Pt with great speech intelligibility when reading unknown sentence level material (>98) with Mod I use of strategies. Continued self-monitoring observed with need to self-correct x 1 which pt completed independently.   PATIENT EDUCATION: Education details: see above Person educated: Patient Education  method: Explanation and written cues Education comprehension: verbalized understanding and needs further education  HOME EXERCISE PROGRAM:     Aspiration Precautions  Compensatory Swallow strategies  Complete EMST   GOALS: Goals reviewed with patient? Yes  SHORT TERM GOALS: Target date: 10 sessions  With Mod I, pt will keep food and liquid in mouth while eating without losing the bolus out of the front of the mouth to safely consume least restrictive diet. Baseline: Goal status: INITIAL  2.  With Mod I, pt will complete lingual strengthening exercises (anterior and posterior) x 15 times in order to increase muscle strength to reduce oral residue given regular textures.  Baseline:  Goal status: INITIAL  3.  With Mod I, patient will complete 3 sets of 10 repetitions with EMST set at 100cmH2O with self-reported effortful of 7 out of 10. Baseline:  Goal status: INITIAL  4.  With Mod I, patient will use dysarthria compensations (slow, loud, overpronounce, pause) in 2-3 sentence responses with 95% accuracy. Baseline:  Goal status: INITIAL   LONG TERM GOALS: Target date: 04/27/2024  With Mod I, pt will utilize safe swallwoing strategies in order to reduce risk of aspiration, dehydration and weight loss with consuming regular textures. Baseline:  Goal status: INITIAL  2.  With Mod I, pt will demonstrate the ability to adequately self-monitor swallowing skills and perform appropriate compensatory techniques to reduce s/s of aspiration and to safely consume least restrictive diet.     Baseline:  Goal status: INITIAL  3.  With Mod I, patient will improve speech intelligibility for  paragraph by controlling rate of speech, over-articulation, and increased loudness to achieve 95% intelligibility.  Baseline:  Goal status: INITIAL   ASSESSMENT:  CLINICAL IMPRESSION: Patient is a 53 year old right handed male who was seen today for an dysarthria and dysphagia treatment d/t recent  right internal capsule and pons CVA.    Pt presents with mild to moderate flaccid dysarthria d/t left side facial and lingual weakness that in reduced lip seal (during production of plosives) and imprecise lingual articulation. Pt's vocal quality was perceptionally wetter the longer that he talked. He reports that I start slobbering on the left side when I talk a lot.  Pt presents with mild oropharyngeal dysphagia that is c/b reduced lingual manipulation of boluses and left labial spillage of liquids. Pt reports my swallowing is decent but when I drink water it comes out on the left side. He reports drinking from a large insulated cup rim.   Pt eager and good response to therapy; improved speech intelligibility as a result. See the above treatment note for details.    OBJECTIVE IMPAIRMENTS include dysarthria and dysphagia. These impairments are limiting patient from return to work, effectively communicating at home and in community, and safety when swallowing. Factors affecting potential to achieve goals and functional outcome are Multiple remote infarcts and chronic microvascular ischemic. Patient will benefit from skilled SLP services to address above impairments and improve overall function.  REHAB POTENTIAL: Good  PLAN: SLP FREQUENCY: 1-2x/week  SLP DURATION: 8 weeks  PLANNED INTERVENTIONS: Aspiration precaution training, Pharyngeal strengthening exercises, Diet toleration management , Trials of upgraded texture/liquids, Oral motor exercises, SLP instruction and feedback, Compensatory strategies,  and Patient/family education    Anthonee Gelin B. Rubbie, M.S., CCC-SLP, Tree surgeon Certified Brain Injury Specialist [facility]  [facility] Rehabilitation Services Office [phone] Ascom [phone] Fax [phone]

## 2024-03-26 ENCOUNTER — Encounter: Payer: Self-pay | Admitting: Physical Medicine & Rehabilitation

## 2024-03-26 ENCOUNTER — Ambulatory Visit: Attending: Registered Nurse | Admitting: Physical Medicine & Rehabilitation

## 2024-03-26 VITALS — BP 119/89 | HR 99 | Ht 71.0 in | Wt 225.2 lb

## 2024-03-26 DIAGNOSIS — I69354 Hemiplegia and hemiparesis following cerebral infarction affecting left non-dominant side: Secondary | ICD-10-CM | POA: Insufficient documentation

## 2024-03-26 DIAGNOSIS — I6350 Cerebral infarction due to unspecified occlusion or stenosis of unspecified cerebral artery: Secondary | ICD-10-CM | POA: Diagnosis present

## 2024-03-26 NOTE — Patient Instructions (Signed)
 Graduated return to driving instructions were provided. It is recommended that the patient first drives with another licensed driver in an empty parking lot. If the patient does well with this, and they can drive on a quiet street with the licensed driver. If the patient does well with this they can drive on a busy street with a licensed driver. If the patient does well with this, the next time out they can go by himself. For the first month after resuming driving, I recommend no nighttime or Interstate driving.

## 2024-03-26 NOTE — Progress Notes (Signed)
 Subjective:    Patient ID: Mark Mcintosh, male    DOB: July 22, 1971, 53 y.o.   MRN: [id number] From discharge summary after completing inpatient neuro rehab ambulates 80 feet min mod assist and cues for postural stability. Increase his ambulation up to 175 feet with same assistance and cues. Completes alternating foot taps on 4 inch step without assistive device requiring minimal assist. Completed toileting shower and dressing edge of bed. Using rolling walker ambulates bed to toilet and shower to bed with contact-guard. Standing at toilet to self CarraKlenz using left hand on bar with contact-guard, contact-guard and standing with pulling clothing over hips or standing in shower.  53 y.o. right-handed male with history significant for bilateral conjunctival inflammation hypertension as well as tobacco use and class II morbid obesity with BMI 36.26.  Per chart review patient lives alone independent prior to admission.  Presented to [facility] 01/09/2024 with acute onset of left-sided weakness.  MRI showed acute infarct in the posterior limb of the right internal capsule, the overlying right frontal white matter and pons.  Multiple remote infarcts and chronic microvascular ischemic disease.  CTA showed no large vessel occlusion.  Admission chemistries unremarkable except potassium 3.1.  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  TEE with small PFO per cardiology services with no plan for closure.  Neurology follow-up placed on aspirin  and Plavix  for CVA prophylaxis x 3 weeks then aspirin  alone.  Lovenox  for DVT prophylaxis but bilateral Doppler studies negative.  Therapy evaluations completed due to patient decreased functional mobility left-sided weakness was admitted for a comprehensive rehab program.    Admit date: 01/14/2024 Discharge date: 01/30/2024 HPI Still with Left HP  Not driving at this time son drove him  See eye doctor for eye drops He worked in a physical job requiring bimanual activity  and good balance. At this time he is unable to return to work and we discussed that we will really not know his full recovery for another couple months until he completes his outpatient therapy. No falls He is independent with his self-care Pain Inventory Average Pain 2 Pain Right Now 0 My pain is aching  In the last 24 hours, has pain interfered with the following? General activity 1 Relation with others 0 Enjoyment of life 1 What TIME of day is your pain at its worst? daytime Sleep (in general) Fair  Pain is worse with: inactivity Pain improves with: N/A Relief from Meds: 0  walk with assistance use a cane ability to climb steps?  yes do you drive?  no Do you have any goals in this area?  yes  Employed, 40-48 hours a week, last date of employment 01/09/2024  weakness tingling  Any changes since last visit?  no  Primary care Curtis Boom, FNP Neurologist Modena Callander, MD    Family History  Problem Relation Age of Onset   Diabetes Mother    Hypertension Mother    Heart failure Maternal Grandmother    Social History   Socioeconomic History   Marital status: Single    Spouse name: Not on file   Number of children: Not on file   Years of education: Not on file   Highest education level: Not on file  Occupational History   Not on file  Tobacco Use   Smoking status: Every Day    Current packs/day: 0.50    Types: Cigarettes, Cigars   Smokeless tobacco: Never   Tobacco comments:    Patient currently using a Nicotine  patch  and chewing gum to help him stop, states since strokes he went from smoking a pack a day to 5-6.  Vaping Use   Vaping status: Never Used  Substance and Sexual Activity   Alcohol use: Not Currently    Alcohol/week: 2.0 standard drinks of alcohol    Types: 2 Cans of beer per week   Drug use: Yes    Types: Marijuana   Sexual activity: Not Currently  Other Topics Concern   Not on file  Social History Narrative   Not on file   Social  Drivers of Health   Financial Resource Strain: Medium Risk (03/03/2024)   Received from [facility]   Overall Financial Resource Strain (CARDIA)    Difficulty of Paying Living Expenses: Somewhat hard  Food Insecurity: No Food Insecurity (03/03/2024)   Received from [facility]   Hunger Vital Sign    Within the past 12 months, you worried that your food would run out before you got the money to buy more.: Never true    Within the past 12 months, the food you bought just didn't last and you didn't have money to get more.: Never true  Transportation Needs: No Transportation Needs (03/03/2024)   Received from [facility] - Transportation    In the past 12 months, has lack of transportation kept you from medical appointments or from getting medications?: No    Lack of Transportation (Non-Medical): No  Physical Activity: Not on file  Stress: Not on file  Social Connections: Not on file   Past Surgical History:  Procedure Laterality Date   BREAST BIOPSY Right 03/15/2024   US  RT BREAST BX W LOC DEV 1ST LESION IMG BX SPEC US  GUIDE 03/15/2024 [facility]   TEE WITHOUT CARDIOVERSION N/A 01/14/2024   Procedure: ECHOCARDIOGRAM, TRANSESOPHAGEAL;  Surgeon: Alluri, Keller BROCKS, MD;  Location: [facility] ORS;  Service: Cardiovascular;  Laterality: N/A;   Past Medical History:  Diagnosis Date   Arthritis    Hypertension    Stroke (HCC) 01/09/2024   Stated he had 2 strokes.   BP 119/89 (BP Location: Right Arm, Patient Position: Sitting, Cuff Size: Large)   Pulse 99   Ht 5' 11 (1.803 m)   Wt 225 lb 3.2 oz (102.2 kg)   SpO2 94%   BMI 31.41 kg/m   Opioid Risk Score:   Fall Risk Score:  `1  Depression screen [facility] 2/9     03/26/2024   11:11 AM 02/27/2024    9:11 AM 02/11/2024    8:28 AM  Depression screen PHQ 2/9  Decreased Interest 0 1 0  Down, Depressed, Hopeless 0 1 0  PHQ - 2 Score 0 2 0  Altered sleeping 1 1   Tired, decreased  energy 0 2   Change in appetite 0 1   Feeling bad or failure about yourself  0 1   Trouble concentrating 1 2   Moving slowly or fidgety/restless 0 1   Suicidal thoughts 0 0   PHQ-9 Score 2 10   Difficult doing work/chores Somewhat difficult Not difficult at all       Review of Systems  Cardiovascular:        Hand swelling  Neurological:  Positive for weakness.       Tingling of left hand History of stroke       Objective:   Physical Exam  General No acute distress Mood and affect appropriate Visual fields are intact confrontation testing  Extraocular muscles are intact Motor strength is 5/5 in the right deltoid, bicep, tricep, grip grip, hip flexor, knee extensors, ankle dorsiflexors plantar 4/5 in the left deltoid, bicep, tricep, grip, hip flexor, knee extensor, ankle dorsiflexion plantarflexion Sensation intact bilaterally to light touch in the upper and lower limbs Ambulates without an assistive device no evidence of toe drag or knee stability he can toe walk but he is unable to do tandem gait.      Assessment & Plan:  1.  Right CVA with left hemiparesis he is approximately 3 months post CVA and is improving should be starting to level off although I do think he will get some continued improvements for the next couple months and I would recommend he continues his outpatient therapy PT OT and speech. I would like to see him back at the end of October and reassess his work readiness. In the meantime I do think he can go back to driving Graduated return to driving instructions were provided. It is recommended that the patient first drives with another licensed driver in an empty parking lot. If the patient does well with this, and they can drive on a quiet street with the licensed driver. If the patient does well with this they can drive on a busy street with a licensed driver. If the patient does well with this, the next time out they can go by himself. For the first month after  resuming driving, I recommend no nighttime or Interstate driving.

## 2024-03-26 NOTE — Telephone Encounter (Signed)
 Copied from CRM [id number]. Topic: General - Other >> Mar 26, 2024  2:46 PM Geneva B wrote: Reason for CRM: pt is calling saying that his medical request form needs to be filled out and sent back pt said he already spoke with medical records please fax [phone]

## 2024-03-28 NOTE — Therapy (Signed)
 OUTPATIENT OCCUPATIONAL THERAPY NEURO TREATMENT NOTE  Patient Name: Mark Mcintosh MRN: [id number] DOB:[birth year], 53 y.o., male Today's Date: 03/28/2024  PCP: No PCP REFERRING PROVIDER: [name], PA-C  END OF SESSION:   OT End of Session - 03/28/24 1940     Visit Number 8    Number of Visits 24    Date for OT Re-Evaluation 05/11/24    OT Start Time 1145    OT Stop Time 1230    OT Time Calculation (min) 45 min    Activity Tolerance Patient tolerated treatment well    Behavior During Therapy [facility] for tasks assessed/performed             Past Medical History:  Diagnosis Date   Arthritis    Hypertension    Stroke (HCC) 01/09/2024   Stated he had 2 strokes.   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) 02/27/2024   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) 02/27/2024   Gait abnormality 02/27/2024   Left-sided weakness 02/27/2024   Prediabetes 02/27/2024   Abnormal pulse oximetry 02/27/2024   Vitamin D  deficiency 02/27/2024   Bradycardia 02/27/2024   Depression, major, single episode, moderate (HCC) 02/27/2024   Coping style affecting medical condition 01/20/2024   Right pontine cerebrovascular accident (HCC) 01/14/2024   Acute CVA (cerebrovascular accident) (HCC) 01/09/2024   Tobacco use disorder 01/09/2024   Obesity (BMI 30-39.9) 01/09/2024   Hypertension    ONSET DATE:  01/09/24  REFERRING DIAG: I63.9 (ICD-10-CM) - Acute CVA (cerebrovascular accident) (HCC)   THERAPY DIAG:  Muscle weakness (generalized)  Other lack of coordination  Acute CVA (cerebrovascular accident) (HCC)  Rationale for Evaluation and Treatment: Rehabilitation  SUBJECTIVE:  SUBJECTIVE STATEMENT: Pt reports doing well today and is looking forward to spending the weekend with his son from out of town. Pt accompanied by: self  PERTINENT HISTORY: Per chart:  Mark Mcintosh is a 53 y.o. right-handed male with history significant for bilateral conjunctival inflammation hypertension as well as tobacco use and class II morbid obesity with BMI 36.26.  Per chart review patient lives alone independent prior to admission.  Presented to [facility] 01/09/2024 with acute onset of left-sided weakness.  MRI showed acute infarct in the posterior limb of the right internal capsule, the overlying right frontal white matter and pons.  Multiple remote infarcts and chronic microvascular ischemic disease.  CTA showed no large vessel occlusion.  Admission chemistries unremarkable except potassium 3.1.  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  TEE with small PFO per cardiology services with no plan for closure.  Neurology follow-up placed on aspirin  and Plavix  for CVA prophylaxis x 3 weeks then aspirin  alone.  Lovenox  for DVT prophylaxis but bilateral Doppler studies negative.  Therapy evaluations completed due to patient decreased functional mobility left-sided weakness was admitted for a comprehensive rehab program.   PRECAUTIONS: Fall  WEIGHT BEARING RESTRICTIONS: No  PAIN: No pain, just numbness in the hand  Are you having pain? 0/10  FALLS: Has patient fallen in last 6 months? Yes. 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  Get back my independence.    OBJECTIVE:  Note: Objective measures were completed at Evaluation unless otherwise noted.  HAND DOMINANCE: Right  ADLs: Overall ADLs: Son was staying with pt for about a week after d/c from inpatient rehab, but has since returned home.  Friend comes by every other day to check in.   Transfers/ambulation related to ADLs: modified indep with rollator Eating: increased difficulty with cutting food  Grooming: increased difficulty with clipping nails, otherwise manages fine with dominant/unaffected hand UB Dressing: increased time with clothing fasteners LB Dressing: increased time with clothing fasteners  Toileting: increased time to engage L hand into clothing management Bathing: pt sits on shower chair to wash LEs, some SOB with standing in shower Tub Shower transfers: Modified indep Equipment: Shower seat without back  IADLs: Shopping: motorized cart, can push cart for shorter shopping trips.  Light housekeeping: extra time and cautions to avoid falls, sometimes feels SOB with laundry  Meal Prep: friend helps with stove top cooking, pt can do light hot and cold meal prep  Community mobility: rollator, friend drove pt  Medication management: indep  Landscape architect: indep  Handwriting: NT; L non-dominant hand affected   MOBILITY STATUS: Hx of falls, rollator or funiture/wall walking in the home, rollator for community  POSTURE COMMENTS:  L sided hemiparesis   ACTIVITY TOLERANCE: Activity tolerance: Pt reports some dyspnea with prolonged standing/mobility  UPPER EXTREMITY ROM:  BUEs WFL  UPPER EXTREMITY MMT:     MMT Right eval Left eval  Shoulder flexion 5 4-  Shoulder abduction 5 4-  Shoulder adduction    Shoulder extension    Shoulder  internal rotation    Shoulder external rotation    Middle trapezius    Lower trapezius    Elbow flexion 5 4-  Elbow extension 5 4-  Wrist flexion 5 4-  Wrist extension 5 4-  Wrist ulnar deviation    Wrist radial deviation    Wrist pronation 5 4-  Wrist supination 5 4-  (Blank rows = not tested)  HAND FUNCTION: Grip strength: Right: 111 lbs; Left: 51 lbs, Lateral pinch: Right: 23 lbs, Left: 13 lbs, and 3 point pinch: Right: 26 lbs, Left: 13 lbs  COORDINATION: Finger Nose Finger test: increased time and decreased accuracy on the L  9 Hole Peg test: Right: 25 sec; Left: 1 min 27 sec  SENSATION: Pt reports increased tingling/numbness when hand balls up a little, but when he straightens it out it goes back to normal.   EDEMA: no visible edema   MUSCLE TONE: LUE: Mild   COGNITION: Overall cognitive status: Within functional limits for tasks assessed pt reportme difficulties with memory   VISION:             Subjective report: Wears glasses all the time.  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                                                                                                             TREATMENT DATE: 03/25/24 Neuro re-ed: -Facilitated L hand FMC/dexterity skills: [place]  blocks:  -20 blocks timed to flip back>red and place into grid, then remove and flip red>black to place back on table top   -Trial 1: 2 min 14 sec -Trial 2: 2 min 2 sec   -Trial 3: 1 min 50 sec -Facilitated GMC working to [employer] [place]  blocks:  towers of 5-6 blocks  Therapeutic Activity:  -Facilitated L lateral and 3 point pinch patterns and forward/lateral reaching patterns to place and remove therapy resistant clothespins on/off a vertical dowel -Facilitated quick alternating movements with L forearm/pron/sup and lateral/3 point pinching moving clothespins on/off a horizontal dowel.   PATIENT EDUCATION: Education details: LUE GMC/FMC Person educated: Patient Education method: Explanation and Verbal cues Education comprehension: verbalized understanding, demonstrated understanding  HOME EXERCISE PROGRAM: Landy fake, [facility] handout  GOALS: Goals reviewed with patient? Yes  SHORT TERM GOALS: Target date: 03/30/24  Pt will be indep to perform HEP for increasing strength and coordination throughout the LUE. Baseline: Eval: Not yet initiated Goal status: INITIAL  LONG TERM GOALS: Target date: 05/11/24  Pt will increase LUE MMT by 1/2 grade or more to increase engagement of LUE into ADL/IADLs. Baseline: Eval: LUE grossly 4-/5 throughout (R 5/5) Goal status: INITIAL  2.  Pt will increase L grip strength by 20 lbs or more to enable pt to grasp and carry heavy ADL supplies in L hand. Baseline: Eval: L 51 lbs (R 111) Goal status: INITIAL  3.  Pt will increase L hand [facility] skills as demonstrated by completion of 9 hole peg test in <1 min to improve manipulation of small ADL supplies. Baseline: Eval: L 1 min 27 sec (R 25 sec) Goal status: INITIAL  4.  Pt will increase LUE GMC to enable confidence with moving hot pots/pans on/off stove top and in/out of the oven using BUEs. Baseline: Eval: Pt reports the his friend currently assists with heavier meal prep Goal status: INITIAL  ASSESSMENT:  CLINICAL IMPRESSION: Pt continues to present with mild-moderate ataxia throughout the LUE, but demonstrated good improvements in speed with each trial with [place]  discs.  Pt demonstrates good ability to manage all color clothespins with  each pinch pattern noted above, but does struggle with steady reaching when moving pins on/off dowel, especially when reaching with an outstretched arm.  Pt will continue to benefit from continued skilled OT to work towards above noted goals for improving indep with daily tasks, return to work, reduce burden of care on caregivers, and improve QOL.    PERFORMANCE DEFICITS: in functional skills including ADLs,  IADLs, coordination, dexterity, sensation, tone, strength, Fine motor control, Gross motor control, mobility, balance, body mechanics, endurance, decreased knowledge of precautions, decreased knowledge of use of DME, vision, and UE functional use, cognitive skills including memory, and psychosocial skills including coping strategies, environmental adaptation, and routines and behaviors.   IMPAIRMENTS: are limiting patient from ADLs, IADLs, work, and leisure.   CO-MORBIDITIES: has co-morbidities such as tobacco use disorder, HTN, arthritis that affects occupational performance. Patient will benefit from skilled OT to address above impairments and improve overall function.  MODIFICATION OR ASSISTANCE TO COMPLETE EVALUATION: No modification of tasks or assist necessary to complete an evaluation.  OT OCCUPATIONAL PROFILE AND HISTORY: Detailed assessment: Review of records and additional review of physical, cognitive, psychosocial history related to current functional performance.  CLINICAL DECISION MAKING: Moderate - several treatment options, min-mod task modification necessary  REHAB POTENTIAL: Good  EVALUATION COMPLEXITY: Moderate    PLAN:  OT FREQUENCY: 2x/week  OT DURATION: 12 weeks  PLANNED INTERVENTIONS: 97168 OT Re-evaluation, 97535 self care/ADL training, 02889 therapeutic exercise, 97530 therapeutic activity, 97112 neuromuscular re-education, 97140 manual therapy, 97116 gait training, 02989 moist heat, 97010 cryotherapy, 97750 Physical Performance Testing, passive range of motion,  balance training, functional mobility training, visual/perceptual remediation/compensation, psychosocial skills training, energy conservation, coping strategies training, patient/family education, and DME and/or AE instructions  RECOMMENDED OTHER SERVICES: SLP eval and treat d/t pt reporting memory deficits since CVA, drooling, and slurred speech  CONSULTED AND AGREED WITH PLAN OF CARE: Patient  PLAN FOR NEXT SESSION: see above   Inocente Blazing, MS, OTR/L   03/28/2024, 7:41 PM

## 2024-03-29 ENCOUNTER — Ambulatory Visit

## 2024-03-29 ENCOUNTER — Ambulatory Visit: Admitting: Speech Pathology

## 2024-03-29 DIAGNOSIS — R2681 Unsteadiness on feet: Secondary | ICD-10-CM

## 2024-03-29 DIAGNOSIS — M6281 Muscle weakness (generalized): Secondary | ICD-10-CM

## 2024-03-29 DIAGNOSIS — R262 Difficulty in walking, not elsewhere classified: Secondary | ICD-10-CM

## 2024-03-29 DIAGNOSIS — I639 Cerebral infarction, unspecified: Secondary | ICD-10-CM

## 2024-03-29 DIAGNOSIS — R278 Other lack of coordination: Secondary | ICD-10-CM

## 2024-03-29 DIAGNOSIS — R471 Dysarthria and anarthria: Secondary | ICD-10-CM

## 2024-03-29 NOTE — Telephone Encounter (Signed)
 Thank you Delon. If there is something I am missing. Please let me know.

## 2024-03-29 NOTE — Therapy (Signed)
 OUTPATIENT PHYSICAL THERAPY TREATMENT  Physical Therapy Progress Note Dates of reporting period   02/17/24  to   03/29/24   Patient Name: Mark Mcintosh MRN: [id number] DOB:1970-10-19, 53 y.o., male Today's Date: 03/29/2024  PCP: None REFERRING PROVIDER: [name], PA-C  END OF SESSION:   PT End of Session - 03/29/24 1405     Visit Number 10    Number of Visits 25    Date for PT Re-Evaluation 05/11/24    Progress Note Due on Visit 10    PT Start Time 1400    PT Stop Time 1440    PT Time Calculation (min) 40 min    Equipment Utilized During Treatment Gait belt    Activity Tolerance Patient tolerated treatment well;No increased pain    Behavior During Therapy [facility] for tasks assessed/performed            Past Medical History:  Diagnosis Date   Arthritis    Hypertension    Stroke (HCC) 01/09/2024   Stated he had 2 strokes.   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) 02/27/2024   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) 02/27/2024   Gait abnormality 02/27/2024   Left-sided weakness 02/27/2024   Prediabetes 02/27/2024   Abnormal pulse oximetry 02/27/2024   Vitamin D  deficiency 02/27/2024   Bradycardia 02/27/2024   Depression, major, single episode, moderate (HCC) 02/27/2024   Coping style affecting medical condition 01/20/2024   Right pontine cerebrovascular accident (HCC)  01/14/2024   Acute CVA (cerebrovascular accident) (HCC) 01/09/2024   Tobacco use disorder 01/09/2024   Obesity (BMI 30-39.9) 01/09/2024   Hypertension     ONSET DATE: 01/09/24  REFERRING DIAG: I63.9 (ICD-10-CM) - Acute CVA (cerebrovascular accident) (HCC)   THERAPY DIAG:  Muscle weakness (generalized)  Other lack of coordination  Acute CVA (cerebrovascular accident) (HCC)  Difficulty in walking, not elsewhere classified  Unsteadiness on feet  Rationale for Evaluation and Treatment: Rehabilitation  SUBJECTIVE:  SUBJECTIVE STATEMENT:  Pt reports doing well today. Pt denies any recent falls/stumbles since prior session. Pt feels he is making progress overall thus far, but c/o fatigue on hemi side when walking too far and that functional use of his UE without error requires full attention at all times. Pt denies any updates to medications or medical appointment since prior session. Pt reports good compliance with HEP when time permits.    PERTINENT HISTORY: [facility] ED 01/09/24 for progressively worsening L sided-weakness. multiple acute infarcts concerning for embolic showering. PMH includes arthritis, HTN. 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   PAIN:  Are you having pain? No  PRECAUTIONS: None  WEIGHT BEARING RESTRICTIONS: No  FALLS: Has patient fallen in last 6 months? Yes. Number of falls 1- pt reports that he tried to get up at home after returning from IPR and he got up too fast and fell. Pt also reports stumbling on steps at home  LIVING ENVIRONMENT: Lives with: lives alone- pt's son was staying with him until this past Friday- son lives in [place], pt has friends that come by every other day Lives in:  House/apartment Stairs: Yes: External: 4 steps; can reach both Has following equipment at home: Walker - 4 wheeled and shower chair  PLOF: Independent- pt works with mixing chemicals and dyes  PATIENT GOALS: Pt wants to get back to normal and be able to return to work  OBJECTIVE:  Note: Objective measures were completed at Evaluation unless otherwise noted.  DIAGNOSTIC FINDINGS: via chart  From 01/09/24 MRI Brain W/O Contrast: IMPRESSION: 1. Acute infarcts in the posterior limb of the right internal capsule, the overlying right frontal white matter, and pons. Given involvement of multiple vascular territories, consider an embolic etiology. 2. Multiple remote infarcts and chronic microvascular ischemic disease, detailed above.  From 01/10/24 CT Angio Head Neck W W/O CM: IMPRESSION: 1. No acute intracranial hemorrhage or mass effect. 2. No large vessel occlusion, hemodynamically significant stenosis, or aneurysm in the head or neck. 3. Remote lacunar infarcts in the genu of the left internal capsule, globus pallidus, and right corona radiata. 4. Acute infarct involving the posterior limb of the right internal capsule is less well appreciated at CT. 5. Moderately advanced periventricular white matter disease for age.   FUNCTIONAL TESTS (at eval):  5 times sit-to-stand: 17.9 seconds, pt utilizing arms on knees to assist with standing : 0.56 m/s with 4WW and CGA; 0.73 m/s with no AD and CGA BERG Balance Scale: 33/56 FGA: 18/30 on 02/25/24 : 795 ft with 4WW on 02/25/24  PATIENT SURVEYS (eval):  Stroke Impact Scale: 45/80                                                                                                                             TREATMENT DATE: 03/29/24 -5xSTS: 12.65 sec hands free, 11.71sec hands free  - : 9.75sec and 8.61sec (1.26m/s)  -  BBT: 54 (qualitatively still very much off baseline) -FGA: 27/30 (previously 21/30)  -functional load moving all  items from 2nd shelf in wellzone (mostly kettlebells 15-25lb) up to 3rd shelf, then back down to 2nd shelf, then 25lb bag lift and carry 2x32ft, and 10lb med ball lift and carry 2x30ft *supervision assist provided, no LOB, incorporate some use of BUE, but not to baseline level   PATIENT EDUCATION: Education details: Pt has made remarkable toward ADL based goals, however a massive gap remains between activities he has done here in clinic and what he will need to do to prepare for return to work duties.  Person educated: Patient Education method: Explanation, Demonstration, and Handouts Education comprehension: verbalized understanding and returned demonstration  HOME EXERCISE PROGRAM: Access Code: [email or web address]  URL: [email or web address]  Date: 02/17/2024  Prepared by: Darryle Patten  Exercises: - Seated March  - 1 x daily - 3 x weekly - 3 sets - 10 reps  - Standing March with Counter Support  - 1 x daily - 3 x weekly - 3 sets - 10 reps  - Mini Squat with Counter Support  - 1 x daily - 3 x weekly - 3 sets - 10 reps  - Heel Toe Raises with Counter Support  - 1 x daily - 3 x weekly - 3 sets - 10 reps   GOALS: Goals reviewed with patient? Yes  SHORT TERM GOALS: Target date: 03/09/2024  Patient will be independent in home exercise program to improve strength/mobility for better functional independence with ADL. Baseline: HEP reviewed during eval Goal status: ACHIEVED  LONG TERM GOALS: Target date: 05/11/2024  Patient will increase their Stroke Impact Scale score by >10 points for improved function and independence Baseline: 45/80 Goal status: FU score pending   2.  Patient (< 105 years old) will complete five times sit to stand test in < 10 seconds indicating an increased LE strength and improved balance. Baseline: 17.9 seconds with UE support; 03/29/24: 11.7 hands free Goal status: progress made, goal not met   3.  Patient will increase 10 meter walk test to >0.8 m/s as to  improve gait speed for better community ambulation and to reduce fall risk. Baseline: 0.56 m/s with 5TT & 0.73 m/s with no AD ; 8/12: 1.79m/s  Goal status: ACHIEVED  4.  Patient will increase Berg Balance score by > 6 points to demonstrate decreased fall risk during functional activities. Baseline: 33/56; 03/30/24: 54/56 Goal status: ACHIEVED, Pt has hit ceiling affect with BBT, needs different tool to show impairments and future progress.   5.  Patient will increase Functional Gait Assessment score to >20/30 as to reduce fall risk and improve dynamic gait safety with community ambulation. Baseline:18/30 on 02/25/24; 03/30/24: 27/30 Goal status: ACHIEVED  6.  Patient will increase six minute walk test distance by >50 ft for progression to limited community ambulator. Baseline: 781ft with 4WW on 02/25/24; 03/11/24: 933ft c SPC  Goal status: MET  ASSESSMENT:  CLINICAL IMPRESSION:  10th visit reassessment performed this date. 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 We will plan to update clnical assessment tools to better speak to his remaining impairment, as he remains very much far off the physical abilities to return to his work at this time. Pt will continue to benefit from skilled physical therapy intervention to address impairments, improve QOL, and attain therapy goals.  OBJECTIVE IMPAIRMENTS: Abnormal gait, decreased activity tolerance, decreased balance, decreased coordination, decreased endurance, decreased mobility, difficulty walking, decreased ROM, decreased strength, hypomobility, impaired perceived functional ability, impaired flexibility, impaired UE functional use, improper body mechanics, and postural dysfunction.   ACTIVITY LIMITATIONS: carrying, lifting, bending, sitting, standing, squatting, stairs, transfers, bathing, reach over head,  hygiene/grooming, and locomotion level  PARTICIPATION LIMITATIONS: meal prep, cleaning, laundry, driving, shopping, community activity, occupation, and yard work  PERSONAL FACTORS: Age and Fitness are also affecting patient's functional outcome.   REHAB POTENTIAL: Good  CLINICAL DECISION MAKING: Evolving/moderate complexity  EVALUATION COMPLEXITY: Moderate  PLAN:  PT FREQUENCY: 2x/week  PT DURATION: 12 weeks  PLANNED INTERVENTIONS: 97164- PT Re-evaluation, 97750- Physical Performance Testing, 97110-Therapeutic exercises, 97530- Therapeutic activity, [id number]- Neuromuscular re-education, 97535- Self Care, [id number]- Manual therapy, [id number]- Gait training, [phone]- Orthotic Initial, [phone]- Orthotic/Prosthetic subsequent, [phone]- Canalith repositioning, [phone]- Splinting, [phone]- Electrical stimulation (manual), [id number]- Ultrasound, [id number]- Traction (mechanical), 20560 (1-2 muscles), 20561 (3+ muscles)- Dry Needling, Patient/Family education, Balance training, Stair training, Taping, Joint mobilization, Spinal mobilization, Vestibular training, Visual/preceptual remediation/compensation, Cognitive remediation, DME instructions, Cryotherapy, and Moist heat  PLAN FOR NEXT SESSION:  -starting working on job related duties to improve stamina and lifting+mobility/balance abilities -retest SIS -consider mini BesTEST to better represent remaining deficits in balance  8:13 AM, 03/30/24 Peggye JAYSON Linear, PT, DPT Physical Therapist - [facility]  Outpatient Physical Therapy- Main Campus [phone]

## 2024-03-29 NOTE — Therapy (Signed)
 OUTPATIENT SPEECH LANGUAGE PATHOLOGY  SWALLOW and DYSARTHRIA TREATMENT NOTE   Patient Name: Macen Joslin MRN: [id number] DOB:13-May-1971, 53 y.o., male Today's Date: 03/29/2024  PCP: Curtis Boom, FNP REFERRING PROVIDER: Fidela Ned, NP   End of Session - 03/29/24 1435     Visit Number 6    Number of Visits 17    Date for SLP Re-Evaluation 04/27/24    Authorization Type [employer]    Progress Note Due on Visit 10    SLP Start Time 1445    SLP Stop Time  1520    SLP Time Calculation (min) 35 min    Activity Tolerance Patient tolerated treatment well          Past Medical History:  Diagnosis Date   Arthritis    Hypertension    Stroke (HCC) 01/09/2024   Stated he had 2 strokes.   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) 02/27/2024   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) 02/27/2024   Gait abnormality 02/27/2024   Left-sided weakness 02/27/2024   Prediabetes 02/27/2024   Abnormal pulse oximetry 02/27/2024   Vitamin D  deficiency 02/27/2024   Bradycardia 02/27/2024   Depression, major, single episode, moderate (HCC) 02/27/2024   Coping style affecting medical condition 01/20/2024   Right pontine cerebrovascular accident (HCC) 01/14/2024   Acute CVA (cerebrovascular accident) (HCC) 01/09/2024   Tobacco use disorder 01/09/2024   Obesity (BMI 30-39.9)  01/09/2024   Hypertension     ONSET DATE: 01/09/2024   REFERRING DIAG:  R47.1 (ICD-10-CM) - Dysarthria  R13.10 (ICD-10-CM) - Dysphagia    THERAPY DIAG:  Dysarthria and anarthria  Rationale for Evaluation and Treatment Rehabilitation  SUBJECTIVE:   PERTINENT HISTORY and DIAGNOSTIC FINDINGS: Pt is a right handed 53 year old male with past medical history of hypertension and tobacco use disorder. He presented to [facility] ED on 01/09/2024 with left sided weakness. MRI on 01/10/2024 revealed 1. Acute infarcts in the posterior limb of the right internal  capsule, the overlying right frontal white matter, and pons. Given  involvement of multiple vascular territories, consider an embolic  etiology.  2. Multiple remote infarcts and chronic microvascular ischemic  disease. Pt received ST services at [facility] from 01/16/2024 thru 01/29/2024.   PAIN:  Are you having pain? No  FALLS: Has patient fallen in last 6 months?  No  LIVING ENVIRONMENT: Lives with: lives alone Lives in: House/apartment  PLOF:  Level of assistance: Independent with ADLs, Independent with IADLs Employment: Full-time employment  SUBJECTIVE STATEMENT: Pt pleasant, reports continued improvement/progress Pt accompanied by: self  PATIENT GOALS  to improve speech and swallow abilities  OBJECTIVE:   TODAY'S TREATMENT NOTE: Skilled treatment session targeted pt's dysarthria and dysphagia goals. SLP facilitated session by providing the following interventions:  Pt with great recall of information from PMR MD appt. Pt with great speech intelligibility when reading unknown sentence level material (>98) and connected sentence level  responses with Mod I use of strategies. Continued self-monitoring observed with need to self-correct x 1 which pt completed independently.   PATIENT EDUCATION: Education details: see above Person educated: Patient Education method: Explanation and written cues Education comprehension: verbalized  understanding and needs further education  HOME EXERCISE PROGRAM:     Aspiration Precautions  Compensatory Swallow strategies  Complete EMST   GOALS: Goals reviewed with patient? Yes  SHORT TERM GOALS: Target date: 10 sessions  With Mod I, pt will keep food and liquid in mouth while eating without losing the bolus out of the front of the mouth to safely consume least restrictive diet. Baseline: Goal status: INITIAL  2.  With Mod I, pt will complete lingual strengthening exercises (anterior and posterior) x 15 times in order to increase muscle strength to reduce oral residue given regular textures.  Baseline:  Goal status: INITIAL  3.  With Mod I, patient will complete 3 sets of 10 repetitions with EMST set at 100cmH2O with self-reported effortful of 7 out of 10. Baseline:  Goal status: INITIAL  4.  With Mod I, patient will use dysarthria compensations (slow, loud, overpronounce, pause) in 2-3 sentence responses with 95% accuracy. Baseline:  Goal status: INITIAL   LONG TERM GOALS: Target date: 04/27/2024  With Mod I, pt will utilize safe swallwoing strategies in order to reduce risk of aspiration, dehydration and weight loss with consuming regular textures. Baseline:  Goal status: INITIAL  2.  With Mod I, pt will demonstrate the ability to adequately self-monitor swallowing skills and perform appropriate compensatory techniques to reduce s/s of aspiration and to safely consume least restrictive diet.     Baseline:  Goal status: INITIAL  3.  With Mod I, patient will improve speech intelligibility for  paragraph by controlling rate of speech, over-articulation, and increased loudness to achieve 95% intelligibility.  Baseline:  Goal status: INITIAL   ASSESSMENT:  CLINICAL IMPRESSION: Patient is a 53 year old right handed male who was seen today for an dysarthria and dysphagia treatment d/t recent right internal capsule and pons CVA.    Pt presents with mild to moderate  flaccid dysarthria d/t left side facial and lingual weakness that in reduced lip seal (during production of plosives) and imprecise lingual articulation. Pt's vocal quality was perceptionally wetter the longer that he talked. He reports that I start slobbering on the left side when I talk a lot.  Pt presents with mild oropharyngeal dysphagia that is c/b reduced lingual manipulation of boluses and left labial spillage of liquids. Pt reports my swallowing is decent but when I drink water it comes out on the left side. He reports drinking from a large insulated cup rim.   Pt eager and good response to therapy; improved speech intelligibility as a result. See the above treatment note for details.    OBJECTIVE IMPAIRMENTS include dysarthria and dysphagia. These impairments are limiting patient from return to work, effectively communicating at home and in community, and safety when swallowing. Factors affecting potential to achieve goals and functional outcome are Multiple remote infarcts and chronic microvascular ischemic. Patient will benefit from skilled SLP services to address above impairments and improve overall function.  REHAB POTENTIAL: Good  PLAN: SLP FREQUENCY: 1-2x/week  SLP DURATION: 8 weeks  PLANNED INTERVENTIONS: Aspiration precaution training, Pharyngeal strengthening exercises, Diet toleration management , Trials of upgraded texture/liquids, Oral motor exercises, SLP instruction and feedback, Compensatory strategies, and Patient/family education    Ji Fairburn B. Rubbie, M.S., CCC-SLP, CBIS Speech-Language Pathologist Certified  Brain Injury Specialist [facility]  [facility] Rehabilitation Services Office [phone] Ascom [phone] Fax [phone]

## 2024-03-29 NOTE — Therapy (Signed)
 OUTPATIENT OCCUPATIONAL THERAPY NEURO TREATMENT NOTE  Patient Name: Mark Mcintosh MRN: [id number] DOB:[birth year], 53 y.o., male Today's Date: 03/29/2024  PCP: No PCP REFERRING PROVIDER: [name], PA-C  END OF SESSION:   OT End of Session - 03/29/24 1340     Visit Number 9    Number of Visits 24    Date for OT Re-Evaluation 05/11/24    OT Start Time 1315    OT Stop Time 1400    OT Time Calculation (min) 45 min    Activity Tolerance Patient tolerated treatment well    Behavior During Therapy [facility] for tasks assessed/performed             Past Medical History:  Diagnosis Date   Arthritis    Hypertension    Stroke (HCC) 01/09/2024   Stated he had 2 strokes.   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) 02/27/2024   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) 02/27/2024   Gait abnormality 02/27/2024   Left-sided weakness 02/27/2024   Prediabetes 02/27/2024   Abnormal pulse oximetry 02/27/2024   Vitamin D  deficiency 02/27/2024   Bradycardia 02/27/2024   Depression, major, single episode, moderate (HCC) 02/27/2024   Coping style affecting medical condition 01/20/2024   Right pontine cerebrovascular accident (HCC) 01/14/2024   Acute CVA (cerebrovascular accident) (HCC) 01/09/2024   Tobacco use disorder 01/09/2024   Obesity (BMI 30-39.9) 01/09/2024   Hypertension    ONSET DATE:  01/09/24  REFERRING DIAG: I63.9 (ICD-10-CM) - Acute CVA (cerebrovascular accident) (HCC)   THERAPY DIAG:  Muscle weakness (generalized)  Other lack of coordination  Acute CVA (cerebrovascular accident) (HCC)  Rationale for Evaluation and Treatment: Rehabilitation  SUBJECTIVE:  SUBJECTIVE STATEMENT: Pt reports wearing the compression glove seems to help his fingers feel less tight.   Pt accompanied by: self  PERTINENT HISTORY: Per chart:  Mark Mcintosh is a 53 y.o. right-handed male with history significant for bilateral conjunctival inflammation hypertension as well as tobacco use and class II morbid obesity with BMI 36.26.  Per chart review patient lives alone independent prior to admission.  Presented to [facility]/09/2024 with acute onset of left-sided weakness.  MRI showed acute infarct in the posterior limb of the right internal capsule, the overlying right frontal white matter and pons.  Multiple remote infarcts and chronic microvascular ischemic disease.  CTA showed no large vessel occlusion.  Admission chemistries unremarkable except potassium 3.1.  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  TEE with small PFO per cardiology services with no plan for closure.  Neurology follow-up placed on aspirin  and Plavix  for CVA prophylaxis x 3 weeks then aspirin  alone.  Lovenox  for DVT prophylaxis but bilateral Doppler studies negative.  Therapy evaluations completed due to patient decreased functional mobility left-sided weakness was admitted for a comprehensive rehab program.   PRECAUTIONS: Fall  WEIGHT BEARING RESTRICTIONS: No  PAIN: No pain, just numbness in the hand  Are you having  pain? 0/10  FALLS: Has patient fallen in last 6 months? Yes. 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  Get back my independence.    OBJECTIVE:  Note: Objective measures were completed at Evaluation unless otherwise noted.  HAND DOMINANCE: Right  ADLs: Overall ADLs: Son was staying with pt for about a week after d/c from inpatient rehab, but has since returned home.  Friend comes by every other day to check in.   Transfers/ambulation related to ADLs: modified indep with rollator Eating: increased difficulty with cutting food  Grooming: increased difficulty with clipping nails, otherwise manages fine with dominant/unaffected hand UB Dressing: increased time with clothing fasteners LB Dressing: increased time with clothing fasteners  Toileting: increased time to engage L hand into clothing management Bathing: pt sits on shower chair to wash LEs, some SOB with standing in shower Tub Shower transfers: Modified indep Equipment: Shower seat without back  IADLs: Shopping: motorized cart, can push cart for shorter shopping trips.  Light housekeeping: extra time and cautions to avoid falls, sometimes feels SOB with laundry  Meal Prep: friend helps with stove top cooking, pt can do light hot and cold meal prep  Community mobility: rollator, friend drove pt  Medication management: indep  Landscape architect: indep  Handwriting: NT; L non-dominant hand affected   MOBILITY STATUS: Hx of falls, rollator or funiture/wall walking in the home, rollator for community  POSTURE COMMENTS:  L sided hemiparesis   ACTIVITY TOLERANCE: Activity tolerance: Pt reports some dyspnea with prolonged standing/mobility  UPPER EXTREMITY ROM:  BUEs WFL  UPPER EXTREMITY MMT:     MMT Right eval Left eval  Shoulder flexion 5 4-  Shoulder abduction 5 4-  Shoulder adduction    Shoulder extension    Shoulder internal rotation     Shoulder external rotation    Middle trapezius    Lower trapezius    Elbow flexion 5 4-  Elbow extension 5 4-  Wrist flexion 5 4-  Wrist extension 5 4-  Wrist ulnar deviation    Wrist radial deviation    Wrist pronation 5 4-  Wrist supination 5 4-  (Blank rows = not tested)  HAND FUNCTION: Grip strength: Right: 111 lbs; Left: 51 lbs, Lateral pinch: Right: 23 lbs, Left: 13 lbs, and 3 point pinch: Right: 26 lbs, Left: 13 lbs  COORDINATION: Finger Nose Finger test: increased time and decreased accuracy on the L  9 Hole Peg test: Right: 25 sec; Left: 1 min 27 sec  SENSATION: Pt reports increased tingling/numbness when hand balls up a little, but when he straightens it out it goes back to normal.   EDEMA: no visible edema   MUSCLE TONE: LUE: Mild   COGNITION: Overall cognitive status: Within functional limits for tasks assessed pt reportme difficulties with memory   VISION:             Subjective report: Wears glasses all the time.  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                                                                                                             TREATMENT DATE: 03/29/24 Self Care: -Nail care completed with adapted nail clippers (clippers anchored on a base to enable clipping with palm closing clippers); min vc and initial demo for technique.  OT advised on other options for adapted clippers, with recommendation to look for wide base/rubber handles for easier prehension.  Nail filing also recommended until  strength/coordination improves enough for standard clippers if pt does not want to obtain adapted clippers.  Pt able to file nails on each hand following use of adapted clippers this date.   Neuro re-ed: -Facilitated L hand FMC/dexterity skills: Dagoberto Board placed on incline wedge and pegs placed dot side up into pegboard perimeter.  1# wrist weight added to increase GM challenge.  Practiced rotating pegs to dot side down within fingertips, then item storage and translatory skills to remove pegs 1 by 1 from board, storing up to 3 in hand, and discarding 1 by 1.  Therapeutic Activity:  -Dual tasking: Pt practiced storing items in L hand without dropping while ambulating, conversing, and pushing doors to open: Stored items included 2 crumpled up tissues for 1 trial, 2 bills and 4 coins for another trial  PATIENT EDUCATION: Education details: Person educated: Patient Education method: Leisure centre manager cues Education comprehension: verbalized understanding, demonstrated understanding  HOME EXERCISE PROGRAM: Landy fake, [facility] handout  GOALS: Goals reviewed with patient? Yes  SHORT TERM GOALS: Target date: 03/30/24  Pt will be indep to perform HEP for increasing strength and coordination throughout the LUE. Baseline: Eval: Not yet initiated Goal status: INITIAL  LONG TERM GOALS: Target date: 05/11/24  Pt will increase LUE MMT by 1/2 grade or more to increase engagement of LUE into ADL/IADLs. Baseline: Eval: LUE grossly 4-/5 throughout (R 5/5) Goal status: INITIAL  2.  Pt will increase L grip strength by 20 lbs or more to enable pt to grasp and carry heavy ADL supplies in L hand. Baseline: Eval: L 51 lbs (R 111) Goal status: INITIAL  3.  Pt will increase L hand [facility] skills as demonstrated by completion of 9 hole peg test in <1 min to improve manipulation of small ADL supplies. Baseline: Eval: L 1 min 27 sec (R 25 sec) Goal status: INITIAL  4.  Pt will increase LUE GMC to enable  confidence with moving hot pots/pans on/off stove top and in/out of the oven using BUEs. Baseline: Eval: Pt reports the his friend currently assists with heavier meal prep Goal status: INITIAL  ASSESSMENT:  CLINICAL IMPRESSION: Pt reports improvement in L hand swelling when wearing compression glove, but still some swelling if glove is doffed.  OT demonstrated  edema massage technique to digits today with pt verbalizing understanding.  Pt reports he frequently drops items from L hand if he is dual tasking.  This addressed today with pt carrying items in L hand while conversing and ambulating.  Pt able to keep items in hand without dropping today while talking and ambulating up/down a long hallway, but did verbalize having to focus very hard on his hand to avoid dropping items.  Pt is improving with ability to reach toward a target with placement of Judy Board pegs, but did have occasional dropping when working to reposition pegs in fingertips to rotate pegs 180*.  Able to clip nails today with extra time and adapted nail clippers anchored to a base.  Pt receptive to other nail care strategies/adapted grooming utensils and was advised on options to obtain if desired.  Pt continues to present with mild-moderate ataxia throughout the LUE.  Pt will benefit from continued skilled OT to work towards above noted goals for improving indep with daily tasks, return to work, reduce burden of care on caregivers, and improve QOL.    PERFORMANCE DEFICITS: in functional skills including ADLs, IADLs, coordination, dexterity, sensation, tone, strength, Fine motor control, Gross motor control, mobility, balance, body mechanics, endurance, decreased knowledge of precautions, decreased knowledge of use of DME, vision, and UE functional use, cognitive skills including memory, and psychosocial skills including coping strategies, environmental adaptation, and routines and behaviors.   IMPAIRMENTS: are limiting patient from ADLs,  IADLs, work, and leisure.   CO-MORBIDITIES: has co-morbidities such as tobacco use disorder, HTN, arthritis that affects occupational performance. Patient will benefit from skilled OT to address above impairments and improve overall function.  MODIFICATION OR ASSISTANCE TO COMPLETE EVALUATION: No modification of tasks or assist necessary to complete an evaluation.  OT OCCUPATIONAL PROFILE AND HISTORY: Detailed assessment: Review of records and additional review of physical, cognitive, psychosocial history related to current functional performance.  CLINICAL DECISION MAKING: Moderate - several treatment options, min-mod task modification necessary  REHAB POTENTIAL: Good  EVALUATION COMPLEXITY: Moderate    PLAN:  OT FREQUENCY: 2x/week  OT DURATION: 12 weeks  PLANNED INTERVENTIONS: 97168 OT Re-evaluation, 97535 self care/ADL training, 02889 therapeutic exercise, 97530 therapeutic activity, 97112 neuromuscular re-education, 97140 manual therapy, 97116 gait training, 02989 moist heat, 97010 cryotherapy, 97750 Physical Performance Testing, passive range of motion, balance training, functional mobility training, visual/perceptual remediation/compensation, psychosocial skills training, energy conservation, coping strategies training, patient/family education, and DME and/or AE instructions  RECOMMENDED OTHER SERVICES: SLP eval and treat d/t pt reporting memory deficits since CVA, drooling, and slurred speech  CONSULTED AND AGREED WITH PLAN OF CARE: Patient  PLAN FOR NEXT SESSION: see above   Inocente Blazing, MS, OTR/L   03/29/2024, 4:06 PM

## 2024-03-29 NOTE — Telephone Encounter (Signed)
 This was faxed on 03/05/2024 and again today

## 2024-03-31 ENCOUNTER — Ambulatory Visit: Admitting: Physical Therapy

## 2024-03-31 ENCOUNTER — Ambulatory Visit

## 2024-03-31 ENCOUNTER — Ambulatory Visit: Admitting: Speech Pathology

## 2024-03-31 DIAGNOSIS — M6281 Muscle weakness (generalized): Secondary | ICD-10-CM

## 2024-03-31 DIAGNOSIS — R2681 Unsteadiness on feet: Secondary | ICD-10-CM

## 2024-03-31 DIAGNOSIS — R2689 Other abnormalities of gait and mobility: Secondary | ICD-10-CM

## 2024-03-31 DIAGNOSIS — I639 Cerebral infarction, unspecified: Secondary | ICD-10-CM

## 2024-03-31 DIAGNOSIS — R278 Other lack of coordination: Secondary | ICD-10-CM

## 2024-03-31 DIAGNOSIS — R262 Difficulty in walking, not elsewhere classified: Secondary | ICD-10-CM

## 2024-03-31 DIAGNOSIS — R471 Dysarthria and anarthria: Secondary | ICD-10-CM

## 2024-03-31 NOTE — Therapy (Signed)
 OUTPATIENT PHYSICAL THERAPY TREATMENT  Physical Therapy Progress Note Dates of reporting period   02/17/24  to   03/29/24   Patient Name: Mark Mcintosh MRN: [id number] DOB:December 03, 1970, 53 y.o., male Today's Date: 03/31/2024  PCP: None REFERRING PROVIDER: [name], PA-C  END OF SESSION:   PT End of Session - 03/31/24 1115     Visit Number 11    Number of Visits 25    Date for PT Re-Evaluation 05/11/24    Progress Note Due on Visit 10    PT Start Time 1106    PT Stop Time 1145    PT Time Calculation (min) 39 min    Equipment Utilized During Treatment Gait belt    Activity Tolerance Patient tolerated treatment well;No increased pain    Behavior During Therapy [facility] for tasks assessed/performed            Past Medical History:  Diagnosis Date   Arthritis    Hypertension    Stroke (HCC) 01/09/2024   Stated he had 2 strokes.   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) 02/27/2024   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) 02/27/2024   Gait abnormality 02/27/2024   Left-sided weakness 02/27/2024   Prediabetes 02/27/2024   Abnormal pulse oximetry 02/27/2024   Vitamin D  deficiency 02/27/2024   Bradycardia 02/27/2024   Depression, major, single episode, moderate (HCC) 02/27/2024   Coping style affecting medical condition 01/20/2024   Right pontine cerebrovascular accident (HCC)  01/14/2024   Acute CVA (cerebrovascular accident) (HCC) 01/09/2024   Tobacco use disorder 01/09/2024   Obesity (BMI 30-39.9) 01/09/2024   Hypertension     ONSET DATE: 01/09/24  REFERRING DIAG: I63.9 (ICD-10-CM) - Acute CVA (cerebrovascular accident) (HCC)   THERAPY DIAG:  Muscle weakness (generalized)  Other lack of coordination  Acute CVA (cerebrovascular accident) (HCC)  Difficulty in walking, not elsewhere classified  Unsteadiness on feet  Other abnormalities of gait and mobility  Rationale for Evaluation and Treatment: Rehabilitation  SUBJECTIVE:  SUBJECTIVE STATEMENT:  Pt reports doing well today. Pt denies any recent falls/stumbles since prior session. Is a little upset that he did not perform as well as he hoped he would on OT progress note assessment. No pain reported on this day.    PERTINENT HISTORY: [facility] ED 01/09/24 for progressively worsening L sided-weakness. multiple acute infarcts concerning for embolic showering. PMH includes arthritis, HTN. 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   PAIN:  Are you having pain? No  PRECAUTIONS: None  WEIGHT BEARING RESTRICTIONS: No  FALLS: Has patient fallen in last 6 months? Yes. Number of falls 1- pt reports that he tried to get up at home after returning from [facility] and he got up too fast and fell. Pt also reports stumbling on steps at home  LIVING ENVIRONMENT: Lives with: lives alone- pt's son was staying with him until this past Friday- son lives in [place], pt has friends that come by every other day Lives in: House/apartment Stairs: Yes: External: 4 steps; can reach both Has following equipment at home: Walker - 4 wheeled and shower chair  PLOF: Independent-  pt works with mixing chemicals and dyes  PATIENT GOALS: Pt wants to get back to normal and be able to return to work  OBJECTIVE:  Note: Objective measures were completed at Evaluation unless otherwise noted.  DIAGNOSTIC FINDINGS: via chart  From 01/09/24 MRI Brain W/O Contrast: IMPRESSION: 1. Acute infarcts in the posterior limb of the right internal capsule, the overlying right frontal white matter, and pons. Given involvement of multiple vascular territories, consider an embolic etiology. 2. Multiple remote infarcts and chronic microvascular ischemic disease, detailed above.  From 01/10/24 CT Angio Head Neck W W/O CM: IMPRESSION: 1. No acute intracranial hemorrhage or mass effect. 2. No large vessel occlusion, hemodynamically significant stenosis, or aneurysm in the head or neck. 3. Remote lacunar infarcts in the genu of the left internal capsule, globus pallidus, and right corona radiata. 4. Acute infarct involving the posterior limb of the right internal capsule is less well appreciated at CT. 5. Moderately advanced periventricular white matter disease for age.   FUNCTIONAL TESTS (at eval):  5 times sit-to-stand: 17.9 seconds, pt utilizing arms on knees to assist with standing : 0.56 m/s with 4WW and CGA; 0.73 m/s with no AD and CGA BERG Balance Scale: 33/56 FGA: 18/30 on 02/25/24 : 795 ft with 4WW on 02/25/24  PATIENT SURVEYS (eval):  Stroke Impact Scale: 45/80                                                                                                                             TREATMENT DATE: 03/31/24 Gait training:  Gait through rehab department x 420ft without resistance. Min cues for improved step height on the LLE due to intermittent foot drag.   TA for improved safety with community mobility and  Standing on airex beam:  Static  stance 2 x 30 sec  Narrow stance 2 x 15 se Side stepping on airex beam R and L x 5 bil no UE support  Tandem gait on  airex beam with light UE support x 5 laps. Min assist to prevent LOB to the R.   Level surface SLS 20 sec hold with intermittent UE support x 3 bil . Able to only hold SLS for 3 sec on the RLE and 4 sec on the LLE  Heel raise/toe raise with reduced UE support x 15 5 inch step up x 12 bil with cues for posture and reduced UE support  Sit<>stand blocked practice without UE support x 12 with cues for full erect posture and anterior weight shift on last 2 rep.   Pt completed SIS 16. See results below (untimed 2 min )  PATIENT EDUCATION: Education details: Pt has made remarkable toward ADL based goals, however a massive gap remains between activities he has done here in clinic and what he will need to do to prepare for return to work duties.  Person educated: Patient Education method: Explanation, Demonstration, and Handouts Education comprehension: verbalized understanding and returned demonstration  HOME EXERCISE PROGRAM: Access Code: [email or web address]  URL: [email or web address]  Date: 02/17/2024  Prepared by: Darryle Patten  Exercises: - Seated March  - 1 x daily - 3 x weekly - 3 sets - 10 reps  - Standing March with Counter Support  - 1 x daily - 3 x weekly - 3 sets - 10 reps  - Mini Squat with Counter Support  - 1 x daily - 3 x weekly - 3 sets - 10 reps  - Heel Toe Raises with Counter Support  - 1 x daily - 3 x weekly - 3 sets - 10 reps   GOALS: Goals reviewed with patient? Yes  SHORT TERM GOALS: Target date: 03/09/2024  Patient will be independent in home exercise program to improve strength/mobility for better functional independence with ADL. Baseline: HEP reviewed during eval Goal status: ACHIEVED  LONG TERM GOALS: Target date: 05/11/2024  Patient will increase their Stroke Impact Scale score by >10 points for improved function and independence Baseline: 45/80 8/13: 70/80 Goal status: MET   2.  Patient (< 69 years old) will complete five times sit to stand test in < 10  seconds indicating an increased LE strength and improved balance. Baseline: 17.9 seconds with UE support; 03/29/24: 11.7 hands free Goal status: progress made, goal not met   3.  Patient will increase 10 meter walk test to >0.8 m/s as to improve gait speed for better community ambulation and to reduce fall risk. Baseline: 0.56 m/s with 5TT & 0.73 m/s with no AD ; 8/12: 1.67m/s  Goal status: ACHIEVED  4.  Patient will increase Berg Balance score by > 6 points to demonstrate decreased fall risk during functional activities. Baseline: 33/56; 03/30/24: 54/56 Goal status: ACHIEVED,    5.  Patient will increase Functional Gait Assessment score to >20/30 as to reduce fall risk and improve dynamic gait safety with community ambulation. Baseline:18/30 on 02/25/24; 03/30/24: 27/30 Goal status: ACHIEVED  6.  Patient will increase six minute walk test distance to > 1566ft ft for progression to limited community Ambulator and improved independence with access to community and occupation  Baseline: 729ft with 4WW on 02/25/24; 03/11/24: 916ft c SPC(MET >61ft improvement)  Goal status: MET: adjusted. NEW.   7. Pt will increase SLS to > 20 sec bil to improve safety with ADLs and IADLs  including showering, shopping, and work related tasks.   Baseline: 8/13: 4 sec on the R, 3 sec on the L   Goal status NEW.   ASSESSMENT:  CLINICAL IMPRESSION:  PT treatment consisted of continued dynamic mobility and balance training to improve safety with mobility and community access. Difficulty with tandem gait on unlevel surface or SLS with prolonged hold. Goals adjusted slightly to reflect pt's improved functional status. Greatest difficulty with static SLS. Therapeutic use of self to encourage pt on significant progress since CVA in May from min-mod assist for transfers to now able to complete SLS without UE support on BLE.   Pt will continue to benefit from skilled physical therapy intervention to address impairments, improve  QOL, and attain therapy goals.    OBJECTIVE IMPAIRMENTS: Abnormal gait, decreased activity tolerance, decreased balance, decreased coordination, decreased endurance, decreased mobility, difficulty walking, decreased ROM, decreased strength, hypomobility, impaired perceived functional ability, impaired flexibility, impaired UE functional use, improper body mechanics, and postural dysfunction.   ACTIVITY LIMITATIONS: carrying, lifting, bending, sitting, standing, squatting, stairs, transfers, bathing, reach over head, hygiene/grooming, and locomotion level  PARTICIPATION LIMITATIONS: meal prep, cleaning, laundry, driving, shopping, community activity, occupation, and yard work  PERSONAL FACTORS: Age and Fitness are also affecting patient's functional outcome.   REHAB POTENTIAL: Good  CLINICAL DECISION MAKING: Evolving/moderate complexity  EVALUATION COMPLEXITY: Moderate  PLAN:  PT FREQUENCY: 2x/week  PT DURATION: 12 weeks  PLANNED INTERVENTIONS: [id number]- PT Re-evaluation, 97750- Physical Performance Testing, 97110-Therapeutic exercises, 97530- Therapeutic activity, [id number]- Neuromuscular re-education, 97535- Self Care, [id number]- Manual therapy, [id number]- Gait training, [phone]- Orthotic Initial, [phone]- Orthotic/Prosthetic subsequent, [phone]- Canalith repositioning, [phone]- Splinting, [phone]- Electrical stimulation (manual), [id number]- Ultrasound, [id number]- Traction (mechanical), 20560 (1-2 muscles), [id number] (3+ muscles)- Dry Needling, Patient/Family education, Balance training, Stair training, Taping, Joint mobilization, Spinal mobilization, Vestibular training, Visual/preceptual remediation/compensation, Cognitive remediation, DME instructions, Cryotherapy, and Moist heat  PLAN FOR NEXT SESSION:  - continue working on job related duties to improve stamina and lifting+mobility/balance abilities - SLS   Massie Dollar PT, DPT  Physical Therapist - [facility]  [facility]  11:54 AM 03/31/24

## 2024-03-31 NOTE — Therapy (Signed)
 OUTPATIENT OCCUPATIONAL THERAPY NEURO PROGRESS AND TREATMENT NOTE Reporting period beginning 02/17/24-03/31/24  Patient Name: Mark Mcintosh MRN: [id number] DOB:Sep 29, 1970, 53 y.o., male Today's Date: 04/03/2024  PCP: No PCP REFERRING PROVIDER: [name], PA-C  END OF SESSION:   OT End of Session - 04/03/24 2015     Visit Number 10    Number of Visits 24    Date for OT Re-Evaluation 05/11/24    OT Start Time 1015    OT Stop Time 1100    OT Time Calculation (min) 45 min    Activity Tolerance Patient tolerated treatment well    Behavior During Therapy Mark Mcintosh for tasks assessed/performed             Past Medical History:  Diagnosis Date   Arthritis    Hypertension    Stroke (HCC) 01/09/2024   Stated he had 2 strokes.   Past Surgical History:  Procedure Laterality Date   BREAST BIOPSY Right 03/15/2024   US  RT BREAST BX W LOC DEV 1ST LESION IMG BX SPEC US  GUIDE 03/15/2024 [facility]   TEE WITHOUT CARDIOVERSION N/A 01/14/2024   Procedure: ECHOCARDIOGRAM, TRANSESOPHAGEAL;  Surgeon: Alluri, Keller BROCKS, MD;  Location: [facility] ORS;  Service: Cardiovascular;  Laterality: N/A;   Patient Active Problem List   Diagnosis Date Noted   Hemiparesis affecting left side as late effect of cerebrovascular accident (HCC) 04/01/2024   Mass of lower outer quadrant of right breast 02/27/2024   History of cerebrovascular accident (CVA) with residual deficit 02/27/2024   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) 02/27/2024   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) 02/27/2024   Gait abnormality 02/27/2024   Left-sided weakness 02/27/2024   Prediabetes 02/27/2024   Abnormal pulse oximetry 02/27/2024   Vitamin D  deficiency 02/27/2024   Bradycardia 02/27/2024   Depression, major, single episode, moderate (HCC) 02/27/2024   Coping style affecting medical condition 01/20/2024   Cerebrovascular accident (CVA) (HCC) 01/14/2024   Acute CVA  (cerebrovascular accident) (HCC) 01/09/2024   Tobacco use disorder 01/09/2024   Obesity (BMI 30-39.9) 01/09/2024   Hypertension    ONSET DATE: 01/09/24  REFERRING DIAG: I63.9 (ICD-10-CM) - Acute CVA (cerebrovascular accident) (HCC)   THERAPY DIAG:  Muscle weakness (generalized)  Other lack of coordination  Acute CVA (cerebrovascular accident) (HCC)  Rationale for Evaluation and Treatment: Rehabilitation  SUBJECTIVE:  SUBJECTIVE STATEMENT: Pt reports L hand digits still feel tight most of the time, but the compression glove does continue to help. Pt accompanied by: self  PERTINENT HISTORY: Per chart:  Mark Mcintosh is a 53 y.o. right-handed male with history significant for bilateral conjunctival inflammation hypertension as well as tobacco use and class II morbid obesity with BMI 36.26.  Per chart review patient lives alone independent prior to admission.  Presented to [facility] 01/09/2024 with acute onset of left-sided weakness.  MRI showed acute infarct in the posterior limb of the right internal capsule, the overlying right frontal white matter and pons.  Multiple remote infarcts and chronic microvascular ischemic disease.  CTA showed no large vessel occlusion.  Admission chemistries unremarkable except potassium 3.1.  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  TEE with small PFO per cardiology services with no plan for closure.  Neurology follow-up placed on aspirin  and Plavix  for CVA prophylaxis x 3 weeks then aspirin  alone.  Lovenox  for DVT prophylaxis but bilateral Doppler studies negative.  Therapy evaluations completed due to patient decreased functional mobility left-sided weakness was admitted for a comprehensive rehab  program.   PRECAUTIONS: Fall  WEIGHT BEARING RESTRICTIONS: No  PAIN: No pain, just numbness in the hand  Are you having pain? 0/10  FALLS: Has patient fallen in last 6 months? Yes. 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  Get back my independence.    OBJECTIVE:  Note: Objective measures were completed at Evaluation unless otherwise noted.  HAND DOMINANCE: Right  ADLs: Overall ADLs: Son was staying with pt for about a week after d/c from inpatient rehab, but has since returned home.  Friend comes by every other day to check in.   Transfers/ambulation related to ADLs: modified indep with rollator Eating: increased difficulty with cutting food  Grooming: increased difficulty with clipping nails, otherwise manages fine with dominant/unaffected hand UB Dressing: increased time with clothing fasteners LB Dressing: increased time with clothing fasteners  Toileting: increased time to engage L hand into clothing management Bathing: pt sits on shower chair to wash LEs, some SOB with standing in shower Tub Shower transfers: Modified indep Equipment: Shower seat without back  IADLs: Shopping: motorized cart, can push cart for shorter shopping trips.  Light housekeeping: extra time and cautions to avoid falls, sometimes feels SOB with laundry  Meal Prep: friend helps with stove top cooking, pt can do light hot and cold meal prep  Community mobility: rollator, friend drove pt  Medication management: indep  Landscape architect: indep  Handwriting: NT; L non-dominant hand affected   MOBILITY STATUS: Hx of falls, rollator or funiture/wall walking in the home, rollator for community  POSTURE COMMENTS:  L sided hemiparesis   ACTIVITY TOLERANCE: Activity tolerance: Pt reports some dyspnea with prolonged standing/mobility  UPPER EXTREMITY ROM:  BUEs WFL  UPPER EXTREMITY  MMT:     MMT Right eval Left eval Left 03/31/24  Shoulder flexion 5 4- 4-  Shoulder abduction 5 4- 4-  Shoulder adduction     Shoulder extension     Shoulder internal rotation   4-  Shoulder external rotation   3+  Middle trapezius     Lower trapezius     Elbow flexion 5 4- 4+  Elbow extension 5 4- 4+  Wrist flexion 5 4- 4+  Wrist extension 5 4- 4  Wrist ulnar deviation     Wrist radial deviation     Wrist pronation 5 4- 4+  Wrist supination 5 4- 4+  (Blank rows = not tested)  HAND FUNCTION: Eval: Grip strength: Right: 111 lbs; Left: 51 lbs, Lateral pinch: Right: 23 lbs, Left: 13 lbs, and 3 point pinch: Right: 26 lbs, Left: 13 lbs 03/31/24: Grip strength: Right: 101 lbs, Left: 31 lbs, lateral pinch: Right: 23 lbs, Left: 13 lbs, 3 point pinch: Right: 25 lbs, Left: 14 lbs  COORDINATION: Finger Nose Finger test: increased time and decreased accuracy on the L  9 Hole Peg test: Right: 25 sec; Left: 1 min 27 sec 03/31/24: L 52 sec, 1 min 4 sec, 39 sec   SENSATION: Pt reports increased tingling/numbness when hand balls up a little, but when he straightens it out it goes  back to normal.   EDEMA: no visible edema   MUSCLE TONE: LUE: Mild   COGNITION: Overall cognitive status: Within functional limits for tasks assessed pt reportme difficulties with memory   VISION:             Subjective report: Wears glasses all the time.  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                                                                                                              TREATMENT DATE: 03/31/24 Therapeutic Activity: -Objective measures taken and goals updated and reviewed for progress note.  Self Care: -Education related to common reasons for L hand edema post CVA; review of edema management strategies -Education related to possible causes for decline in L/R grip strength and made recommendations for altering/progressing HEP,  -Review of extensive progress made with LUE coordination and provided therapeutic listening and encouragement for anticipation of continued physical and functional gains throughout the LUE.  Therapeutic Exercise: -L hand strengthening: Hand gripper set at 11.2# for pt to remove jumbo pegs from pegboard for 2 trials.  Progressed to 2 more trials with gripper set at 17.9#.  Instructed in self passive wrist and digit extension stretching between sets.   PATIENT EDUCATION: Education details: Progress towards goals, HEP progression  Person educated: Patient Education method: Explanation and Verbal cues Education comprehension: verbalized understanding, demonstrated understanding  HOME EXERCISE PROGRAM: Green theraputty 2-3x per day for 5-10 min periods for L hand grip strengthening, FMC handout  GOALS: Goals reviewed with patient? Yes  SHORT TERM GOALS: Target date: 03/30/24  Pt will be indep to perform HEP for increasing strength and coordination throughout the LUE. Baseline: Eval: Not yet initiated; 03/31/24: Indep with HEP and verbalizes understanding of progressions made today for frequency Goal status: achieved  LONG TERM GOALS: Target date: 05/11/24  Pt will increase LUE MMT by 1/2 grade or more to increase engagement of LUE into ADL/IADLs. Baseline: Eval: LUE grossly 4-/5 throughout (R 5/5); 03/31/24: L shoulder grossly 4-, elbow flex 4+, ext 4, forearm pron/sup 4+ Goal status: in progress  2.  Pt will increase L grip strength by 20 lbs or more to enable pt to grasp and carry heavy ADL supplies in L  hand. Baseline: Eval: L 51 lbs (R 111 lbs); 03/31/24: L grip 31 lbs (R 101 lbs) Goal status: in progress  3.  Pt will increase L hand [facility] skills as demonstrated by completion of 9 hole peg test in <1 min to improve manipulation of small ADL supplies. Baseline: Eval: L 1 min 27 sec (R 25 sec); 03/31/24: 3 trials completed today: L 52 sec, 1 min  4 sec, 39 sec  Goal status: in progress  4.  Pt will increase LUE GMC to enable confidence with moving hot pots/pans on/off stove top and in/out of the oven using BUEs. Baseline: Eval: Pt reports that his friend currently assists with heavier meal prep; 03/31/24: Pt is now cooking independently, but with increased time/caution Goal status: in progress  ASSESSMENT:  CLINICAL IMPRESSION: Pt seen for 10th visit progress update.  Very good gains made thus far in LUE GMC/FMC skills, with pt verbalizing he is no longer requiring his friend's assistance for meal prep, though pt does take increased time and caution d/t residual LUE ataxia.  Ataxia is still mild-moderate, but steadily improving.  Pt demonstrates a significant improvement in L 9 hole peg test, though still shows a deficit as compared to the R unaffected side.  Pt tends to drop small items when working to move items from palm to fingertips or when working to reposition items within fingertips, or when working with multiple items in palm at once.  Pt continues to work towards improving accuracy when reaching toward a target d/t residual mild-moderate ataxia throughout the LUE, but pt does express good improvement with hand to mouth patterns, noting better accuracy and less mess with hand to mouth/face patterns during grooming and self feeding tasks.  Anticipate a possible cause for decline in grip strength measures to be high focus on GMC/FMC activities during OT sessions as coordination deficits were evaluated to be more pronounced and debilitating amidst pt's ADL routine as compared to strength deficits at  eval.  Another cause may be pt verbalizing increased swelling in his digits making his hand feel tight, possibly limiting L grip strength.  Edema is very slight in L hand digits, however, and pt continues to find benefit from compression glove prn.  Pt remains motivated to work towards above unmet goals, and remains adherent with HEP.  Will plan to incorporate additional grip strength activities along with ongoing coordination focus into upcoming treatments, and continue to closely monitor changes in grip strength each week.  OT encouraged increasing putty use to 2-3x daily at home, but for brief periods to focus on L grip strengthening.  Pt in agreement with recommendation.  Pt will continue to benefit from skilled OT to work towards above noted goals for improving indep with daily tasks, return to work, reduce burden of care on caregivers, and improve QOL.    PERFORMANCE DEFICITS: in functional skills including ADLs, IADLs, coordination, dexterity, sensation, tone, strength, Fine motor control, Gross motor control, mobility, balance, body mechanics, endurance, decreased knowledge of precautions, decreased knowledge of use of DME, vision, and UE functional use, cognitive skills including memory, and psychosocial skills including coping strategies, environmental adaptation, and routines and behaviors.   IMPAIRMENTS: are limiting patient from ADLs, IADLs, work, and leisure.   CO-MORBIDITIES: has co-morbidities such as tobacco use disorder, HTN, arthritis that affects occupational performance. Patient will benefit from skilled OT to address above impairments and improve overall function.  MODIFICATION OR ASSISTANCE TO COMPLETE EVALUATION: No modification of tasks or assist necessary to complete an evaluation.  OT OCCUPATIONAL PROFILE AND HISTORY: Detailed assessment: Review of records and additional review of physical, cognitive, psychosocial history related to current functional performance.  CLINICAL  DECISION MAKING: Moderate - several treatment options, min-mod task modification necessary  REHAB POTENTIAL: Good  EVALUATION COMPLEXITY: Moderate  PLAN:  OT FREQUENCY: 2x/week  OT DURATION: 12 weeks  PLANNED INTERVENTIONS: 02831 OT Re-evaluation, 97535 self care/ADL training, 02889 therapeutic  exercise, 97530 therapeutic activity, 97112 neuromuscular re-education, 97140 manual therapy, 97116 gait training, 02989 moist heat, 97010 cryotherapy, 97750 Physical Performance Testing, passive range of motion, balance training, functional mobility training, visual/perceptual remediation/compensation, psychosocial skills training, energy conservation, coping strategies training, patient/family education, and DME and/or AE instructions  RECOMMENDED OTHER SERVICES: SLP eval and treat d/t pt reporting memory deficits since CVA, drooling, and slurred speech  CONSULTED AND AGREED WITH PLAN OF CARE: Patient  PLAN FOR NEXT SESSION: see above   Inocente Blazing, MS, OTR/L   04/03/2024, 8:16 PM

## 2024-03-31 NOTE — Therapy (Signed)
 OUTPATIENT SPEECH LANGUAGE PATHOLOGY  SWALLOW and DYSARTHRIA TREATMENT NOTE   Patient Name: Mark Mcintosh MRN: [id number] DOB:01-07-1971, 53 y.o., male Today's Date: 03/31/2024  PCP: Curtis Boom, FNP REFERRING PROVIDER: Fidela Ned, NP   End of Session - 03/31/24 0933     Visit Number 7    Number of Visits 17    Date for SLP Re-Evaluation 04/27/24    Authorization Type [employer]    Progress Note Due on Visit 10    SLP Start Time 0930    SLP Stop Time  1015    SLP Time Calculation (min) 45 min    Activity Tolerance Patient tolerated treatment well          Past Medical History:  Diagnosis Date   Arthritis    Hypertension    Stroke (HCC) 01/09/2024   Stated he had 2 strokes.   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) 02/27/2024   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) 02/27/2024   Gait abnormality 02/27/2024   Left-sided weakness 02/27/2024   Prediabetes 02/27/2024   Abnormal pulse oximetry 02/27/2024   Vitamin D  deficiency 02/27/2024   Bradycardia 02/27/2024   Depression, major, single episode, moderate (HCC) 02/27/2024   Coping style affecting medical condition 01/20/2024   Right pontine cerebrovascular accident (HCC) 01/14/2024   Acute CVA (cerebrovascular accident) (HCC) 01/09/2024   Tobacco use disorder 01/09/2024   Obesity (BMI 30-39.9)  01/09/2024   Hypertension     ONSET DATE: 01/09/2024   REFERRING DIAG:  R47.1 (ICD-10-CM) - Dysarthria  R13.10 (ICD-10-CM) - Dysphagia    THERAPY DIAG:  Dysarthria and anarthria  Rationale for Evaluation and Treatment Rehabilitation  SUBJECTIVE:   PERTINENT HISTORY and DIAGNOSTIC FINDINGS: Pt is a right handed 53 year old male with past medical history of hypertension and tobacco use disorder. He presented to [facility] ED on 01/09/2024 with left sided weakness. MRI on 01/10/2024 revealed 1. Acute infarcts in the posterior limb of the right internal  capsule, the overlying right frontal white matter, and pons. Given  involvement of multiple vascular territories, consider an embolic  etiology.  2. Multiple remote infarcts and chronic microvascular ischemic  disease. Pt received ST services at [facility] from 01/16/2024 thru 01/29/2024.   PAIN:  Are you having pain? No  FALLS: Has patient fallen in last 6 months?  No  LIVING ENVIRONMENT: Lives with: lives alone Lives in: House/apartment  PLOF:  Level of assistance: Independent with ADLs, Independent with IADLs Employment: Full-time employment  SUBJECTIVE STATEMENT: Pt pleasant, reports continued improvement/progress Pt accompanied by: self  PATIENT GOALS  to improve speech and swallow abilities  OBJECTIVE:   TODAY'S TREATMENT NOTE: Skilled treatment session targeted pt's dysarthria and dysphagia goals. SLP facilitated session by providing the following interventions:  Pt with great speech intelligibility when reading unknown sentence level material (>98) and connected sentence level responses with Mod I use of strategies. Pt with one  instance of decreased speech intelligibility that prevented listener from understanding.   PATIENT EDUCATION: Education details: see above Person educated: Patient Education method: Explanation and written cues Education comprehension: verbalized understanding and needs further education  HOME  EXERCISE PROGRAM:     Aspiration Precautions  Compensatory Swallow strategies  Complete EMST   GOALS: Goals reviewed with patient? Yes  SHORT TERM GOALS: Target date: 10 sessions  With Mod I, pt will keep food and liquid in mouth while eating without losing the bolus out of the front of the mouth to safely consume least restrictive diet. Baseline: Goal status: INITIAL  2.  With Mod I, pt will complete lingual strengthening exercises (anterior and posterior) x 15 times in order to increase muscle strength to reduce oral residue given regular textures.  Baseline:  Goal status: INITIAL  3.  With Mod I, patient will complete 3 sets of 10 repetitions with EMST set at 100cmH2O with self-reported effortful of 7 out of 10. Baseline:  Goal status: INITIAL  4.  With Mod I, patient will use dysarthria compensations (slow, loud, overpronounce, pause) in 2-3 sentence responses with 95% accuracy. Baseline:  Goal status: INITIAL   LONG TERM GOALS: Target date: 04/27/2024  With Mod I, pt will utilize safe swallwoing strategies in order to reduce risk of aspiration, dehydration and weight loss with consuming regular textures. Baseline:  Goal status: INITIAL  2.  With Mod I, pt will demonstrate the ability to adequately self-monitor swallowing skills and perform appropriate compensatory techniques to reduce s/s of aspiration and to safely consume least restrictive diet.     Baseline:  Goal status: INITIAL  3.  With Mod I, patient will improve speech intelligibility for  paragraph by controlling rate of speech, over-articulation, and increased loudness to achieve 95% intelligibility.  Baseline:  Goal status: INITIAL   ASSESSMENT:  CLINICAL IMPRESSION: Patient is a 53 year old right handed male who was seen today for an dysarthria and dysphagia treatment d/t recent right internal capsule and pons CVA.    Pt presents with mild to moderate flaccid dysarthria d/t left side facial and lingual  weakness that in reduced lip seal (during production of plosives) and imprecise lingual articulation. Pt's vocal quality was perceptionally wetter the longer that he talked. He reports that I start slobbering on the left side when I talk a lot.  Pt presents with mild oropharyngeal dysphagia that is c/b reduced lingual manipulation of boluses and left labial spillage of liquids. Pt reports my swallowing is decent but when I drink water it comes out on the left side. He reports drinking from a large insulated cup rim.   Pt eager and good response to therapy; improved speech intelligibility as a result. See the above treatment note for details.    OBJECTIVE IMPAIRMENTS include dysarthria and dysphagia. These impairments are limiting patient from return to work, effectively communicating at home and in community, and safety when swallowing. Factors affecting potential to achieve goals and functional outcome are Multiple remote infarcts and chronic microvascular ischemic. Patient will benefit from skilled SLP services to address above impairments and improve overall function.  REHAB POTENTIAL: Good  PLAN: SLP FREQUENCY: 1-2x/week  SLP DURATION: 8 weeks  PLANNED INTERVENTIONS: Aspiration precaution training, Pharyngeal strengthening exercises, Diet toleration management , Trials of upgraded texture/liquids, Oral motor exercises, SLP instruction and feedback, Compensatory strategies, and Patient/family education    Nikolai Wilczak B. Rubbie, M.S., CCC-SLP, CBIS Speech-Language Pathologist Certified Brain Injury Specialist [place]  [facility]  Rehabilitation Services Office [phone] Ascom [phone] Fax [phone]

## 2024-04-01 ENCOUNTER — Ambulatory Visit (INDEPENDENT_AMBULATORY_CARE_PROVIDER_SITE_OTHER): Admitting: Neurology

## 2024-04-01 ENCOUNTER — Telehealth: Payer: Self-pay | Admitting: Neurology

## 2024-04-01 ENCOUNTER — Encounter: Payer: Self-pay | Admitting: Neurology

## 2024-04-01 VITALS — BP 124/88 | HR 88 | Ht 71.0 in | Wt 227.2 lb

## 2024-04-01 DIAGNOSIS — I69354 Hemiplegia and hemiparesis following cerebral infarction affecting left non-dominant side: Secondary | ICD-10-CM | POA: Insufficient documentation

## 2024-04-01 DIAGNOSIS — I639 Cerebral infarction, unspecified: Secondary | ICD-10-CM

## 2024-04-01 NOTE — Progress Notes (Signed)
 Chief Complaint  Patient presents with   Follow-up    Pt in room 15. Alone.Here internal hospital referral for stroke d/c home 6/13. Patient reports doing well, taking physical therapy twice weekly. Walks with cane due to stroke, left side weakness has improved with physical therapy. Patient has a work form to fill out, pt currently on disability.   ASSESSMENT AND PLAN  Mark Mcintosh is a 53 y.o. 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  Also complete the paperwork required for his company, take him off from work until July 11, 2024,   DIAGNOSTIC DATA (LABS, IMAGING, TESTING) - I reviewed patient records, labs, notes, testing and imaging myself where available.   MEDICAL HISTORY:  Mark Mcintosh is a 53 year old male, seen in request by his primary care from [facility] NP [place], Kellie for evaluation of stroke,  History is obtained from the patient and review of electronic medical records. I personally reviewed pertinent available imaging films in PACS.   PMHx of  HTN HLD Smoke  1ppd.  He works second shift in [employer] job, 3-11 PM, on Jan 08, 2024, after his shift, he felt left-sided weakness, have to walk leaning against the wall, failed to improve after hydration, woke up next day was weaker on the left side, fell,  MRI of brain showed acute stroke involving posterior limb of right internal capsule, overlying right frontal white matter and pons, suggestive of embolic events, there was also multiple remote infarction chronic microvascular ischemic disease  Echocardiogram, later TEE showed  intra-atrial septum aneurysmal, agitated saline positive with shunting within 3-6 cardiac cycle, low score, not likely benefit of PFO closure  DVT screening was negative   Laboratory evaluation showed negative RPR, HIV, A1c 5.7, LDL 120  He has residual left-sided weakness, not ready to go back to work yet,  PHYSICAL EXAM:   Vitals:   04/01/24 1238  BP: 124/88  Pulse: 88  Weight: 227 lb 3.2 oz (103.1 kg)  Height: 5' 11 (1.803 m)   Body mass index is 31.69 kg/m.  PHYSICAL EXAMNIATION:  Gen: NAD, conversant, well nourised, well groomed                     Cardiovascular: Regular rate rhythm, no peripheral edema, warm, nontender. Eyes: Conjunctivae clear without exudates or hemorrhage Neck: Supple, no carotid bruits. Pulmonary: Clear to auscultation bilaterally   NEUROLOGICAL EXAM:  MENTAL STATUS: Speech/cognition: Awake, alert, oriented to history taking and casual conversation CRANIAL NERVES: CN II: Visual fields are full to confrontation. Pupils are round equal and briskly reactive to light. CN III, IV, VI: extraocular movement are normal. No ptosis. CN V: Facial sensation is intact to light touch CN VII: Left lower face weakness CN VIII: Hearing is normal to causal conversation. CN IX, X: Phonation is normal. CN XI: Head turning and shoulder shrug are intact  MOTOR: Left arm pronation drift, mild fixation rapid rotating movement, left leg drift,  REFLEXES: Hyperreflexia of left side, left-sided Babinski sign  SENSORY: Intact to light touch, pinprick and vibratory sensation are intact in fingers and toes.  COORDINATION: There is no trunk or limb dysmetria noted.  GAIT/STANCE: Push-up  to get up from seated position, dragging left side  REVIEW OF SYSTEMS:  Full 14 system review of systems performed and notable only for as above All other review of systems were negative.   ALLERGIES: No Known Allergies  HOME MEDICATIONS: Current Outpatient Medications   Medication Sig Dispense Refill   albuterol  (VENTOLIN  HFA) 108 (90 Base) MCG/ACT inhaler Inhale 2 puffs into the lungs every 6 (six) hours as needed for wheezing or shortness of breath. 6.7 g 0   amLODipine  (NORVASC ) 2.5 MG tablet Take 1 tablet (2.5 mg total) by mouth daily. 90 tablet 3   aspirin  EC 81 MG tablet Take 1 tablet (81 mg total) by mouth daily. Swallow whole. 90 tablet 3   atorvastatin  (LIPITOR) 40 MG tablet Take 1 tablet (40 mg total) by mouth daily. 90 tablet 3   Blood Pressure Monitoring (BLOOD PRESSURE CUFF) MISC 1 Device by Does not apply route daily. 1 each 0   irbesartan  (AVAPRO ) 75 MG tablet Take 1 tablet (75 mg total) by mouth daily. 90 tablet 3   nicotine  (NICODERM CQ  - DOSED IN MG/24 HOURS) 21 mg/24hr patch Place 1 patch (21 mg total) onto the skin daily. 28 patch 1   prednisoLONE  acetate (PRED FORTE ) 1 % ophthalmic suspension Place 1 drop into both eyes 4 (four) times daily. 5 mL 0   vitamin D3 (CHOLECALCIFEROL ) 25 MCG tablet Take 1 tablet (1,000 Units total) by mouth daily. 90 tablet 3   No current facility-administered medications for this visit.    PAST MEDICAL HISTORY: Past Medical History:  Diagnosis Date   Arthritis    Hypertension    Stroke (HCC) 01/09/2024   Stated he had 2 strokes.    PAST SURGICAL HISTORY: Past Surgical History:  Procedure Laterality Date   BREAST BIOPSY Right 03/15/2024   US  RT BREAST BX W LOC DEV 1ST LESION IMG BX SPEC US  GUIDE 03/15/2024 [facility]   TEE WITHOUT CARDIOVERSION N/A 01/14/2024   Procedure: ECHOCARDIOGRAM, TRANSESOPHAGEAL;  Surgeon: Alluri, Keller BROCKS, MD;  Location: [facility] ORS;  Service: Cardiovascular;  Laterality: N/A;    FAMILY HISTORY: Family History  Problem Relation Age of Onset   Diabetes Mother    Hypertension Mother    Heart failure Maternal Grandmother     SOCIAL HISTORY: Social History   Socioeconomic History   Marital status: Single    Spouse name: Not on file   Number of children: Not on  file   Years of education: Not on file   Highest education level: Not on file  Occupational History   Not on file  Tobacco Use   Smoking status: Every Day    Current packs/day: 0.50    Types: Cigarettes   Smokeless tobacco: Never   Tobacco comments:    Patient currently using a Nicotine  patch and chewing gum to help him stop, states since strokes he went from smoking a pack a day to 5-6.  Vaping Use   Vaping status: Never Used  Substance and Sexual Activity   Alcohol use: Not Currently    Alcohol/week: 2.0 standard drinks of alcohol    Types: 2 Cans of beer per week   Drug use: Not Currently    Types: Marijuana   Sexual activity: Not Currently  Other Topics Concern   Not on file  Social History Narrative   Not on file   Social Drivers of Health   Financial Resource Strain: Medium Risk (03/03/2024)   Received from [facility]   Overall  Financial Resource Strain (CARDIA)    Difficulty of Paying Living Expenses: Somewhat hard  Food Insecurity: No Food Insecurity (03/03/2024)   Received from [facility]   Hunger Vital Sign    Within the past 12 months, you worried that your food would run out before you got the money to buy more.: Never true    Within the past 12 months, the food you bought just didn't last and you didn't have money to get more.: Never true  Transportation Needs: No Transportation Needs (03/03/2024)   Received from [facility] - Transportation    In the past 12 months, has lack of transportation kept you from medical appointments or from getting medications?: No    Lack of Transportation (Non-Medical): No  Physical Activity: Not on file  Stress: Not on file  Social Connections: Not on file  Intimate Partner Violence: Not At Risk (01/10/2024)   Humiliation, Afraid, Rape, and Kick questionnaire    Fear of Current or Ex-Partner: No    Emotionally Abused: No    Physically Abused: No    Sexually  Abused: No      Modena Callander, M.D. Ph.D.  [facility] [address] [place], [place] [address] Ph: [phone] Fax: [phone]  CC:  Pegge Toribio PARAS, PA-C [address] [place],  [place] [address]  Wellington Curtis LABOR, FNP

## 2024-04-01 NOTE — Telephone Encounter (Signed)
 Need to call pt for loop recorder and NP follow up

## 2024-04-05 NOTE — Telephone Encounter (Signed)
 Returned patients call - got him scheduled for a 6 month f/u with NP and advised him that cardiology would call him directly to schedule

## 2024-04-06 ENCOUNTER — Ambulatory Visit

## 2024-04-06 ENCOUNTER — Ambulatory Visit: Admitting: Physical Therapy

## 2024-04-06 DIAGNOSIS — R262 Difficulty in walking, not elsewhere classified: Secondary | ICD-10-CM

## 2024-04-06 DIAGNOSIS — R2681 Unsteadiness on feet: Secondary | ICD-10-CM

## 2024-04-06 DIAGNOSIS — R2689 Other abnormalities of gait and mobility: Secondary | ICD-10-CM

## 2024-04-06 DIAGNOSIS — M6281 Muscle weakness (generalized): Secondary | ICD-10-CM

## 2024-04-06 DIAGNOSIS — R278 Other lack of coordination: Secondary | ICD-10-CM

## 2024-04-06 DIAGNOSIS — I639 Cerebral infarction, unspecified: Secondary | ICD-10-CM

## 2024-04-06 NOTE — Therapy (Signed)
 OUTPATIENT PHYSICAL THERAPY TREATMENT  Patient Name: Mark Mcintosh MRN: [id number] DOB:07-10-1971, 53 y.o., male Today's Date: 04/06/2024  PCP: None REFERRING PROVIDER: [name], PA-C  END OF SESSION:   PT End of Session - 04/06/24 1318     Visit Number 12    Number of Visits 25    Date for PT Re-Evaluation 05/11/24    Progress Note Due on Visit 10    PT Start Time 1317    PT Stop Time 1357    PT Time Calculation (min) 40 min    Equipment Utilized During Treatment Gait belt    Activity Tolerance Patient tolerated treatment well;No increased pain    Behavior During Therapy [facility] for tasks assessed/performed             Past Medical History:  Diagnosis Date   Arthritis    Hypertension    Stroke (HCC) 01/09/2024   Stated he had 2 strokes.   Past Surgical History:  Procedure Laterality Date   BREAST BIOPSY Right 03/15/2024   US  RT BREAST BX W LOC DEV 1ST LESION IMG BX SPEC US  GUIDE 03/15/2024 [facility]   TEE WITHOUT CARDIOVERSION N/A 01/14/2024   Procedure: ECHOCARDIOGRAM, TRANSESOPHAGEAL;  Surgeon: Alluri, Keller BROCKS, MD;  Location: [facility];  Service: Cardiovascular;  Laterality: N/A;   Patient Active Problem List   Diagnosis Date Noted   Hemiparesis affecting left side as late effect of cerebrovascular accident (HCC) 04/01/2024   Mass of lower outer quadrant of right breast 02/27/2024   History of cerebrovascular accident (CVA) with residual deficit 02/27/2024   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) 02/27/2024   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) 02/27/2024   Gait abnormality 02/27/2024   Left-sided weakness 02/27/2024   Prediabetes 02/27/2024   Abnormal pulse oximetry 02/27/2024   Vitamin D  deficiency 02/27/2024   Bradycardia 02/27/2024   Depression, major, single episode, moderate (HCC) 02/27/2024   Coping style affecting medical condition 01/20/2024   Cerebrovascular accident (CVA)  (HCC) 01/14/2024   Acute CVA (cerebrovascular accident) (HCC) 01/09/2024   Tobacco use disorder 01/09/2024   Obesity (BMI 30-39.9) 01/09/2024   Hypertension     ONSET DATE: 01/09/24  REFERRING DIAG: I63.9 (ICD-10-CM) - Acute CVA (cerebrovascular accident) (HCC)   THERAPY DIAG:  Difficulty in walking, not elsewhere classified  Muscle weakness (generalized)  Unsteadiness on feet  Other abnormalities of gait and mobility  Rationale for Evaluation and Treatment: Rehabilitation  SUBJECTIVE:  SUBJECTIVE STATEMENT:  Pt reports doing well today. Pt denies any recent falls/stumbles since prior session.  Feeling a little bit of an upset stomach for over the weekend but overall doing okay.   PERTINENT HISTORY: [facility] ED 01/09/24 for progressively worsening L sided-weakness. multiple acute infarcts concerning for embolic showering. PMH includes arthritis, HTN. 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   PAIN:  Are you having pain? No  PRECAUTIONS: None  WEIGHT BEARING RESTRICTIONS: No  FALLS: Has patient fallen in last 6 months? Yes. Number of falls 1- pt reports that he tried to get up at home after returning from IPR and he got up too fast and fell. Pt also reports stumbling on steps at home  LIVING ENVIRONMENT: Lives with: lives alone- pt's son was staying with him until this past Friday- son lives in [place], pt has friends that come by every other day Lives in: House/apartment Stairs: Yes: External: 4 steps; can reach both Has following equipment at home: Walker - 4 wheeled and shower chair  PLOF: Independent- pt works with mixing chemicals and dyes  PATIENT GOALS: Pt wants to get back to normal and be able to return to  work  OBJECTIVE:  Note: Objective measures were completed at Evaluation unless otherwise noted.  DIAGNOSTIC FINDINGS: via chart  From 01/09/24 MRI Brain W/O Contrast: IMPRESSION: 1. Acute infarcts in the posterior limb of the right internal capsule, the overlying right frontal white matter, and pons. Given involvement of multiple vascular territories, consider an embolic etiology. 2. Multiple remote infarcts and chronic microvascular ischemic disease, detailed above.  From 01/10/24 CT Angio Head Neck W W/O CM: IMPRESSION: 1. No acute intracranial hemorrhage or mass effect. 2. No large vessel occlusion, hemodynamically significant stenosis, or aneurysm in the head or neck. 3. Remote lacunar infarcts in the genu of the left internal capsule, globus pallidus, and right corona radiata. 4. Acute infarct involving the posterior limb of the right internal capsule is less well appreciated at CT. 5. Moderately advanced periventricular white matter disease for age.   FUNCTIONAL TESTS (at eval):  5 times sit-to-stand: 17.9 seconds, pt utilizing arms on knees to assist with standing : 0.56 m/s with 4WW and CGA; 0.73 m/s with no AD and CGA BERG Balance Scale: 33/56 FGA: 18/30 on 02/25/24 : 795 ft with 4WW on 02/25/24  PATIENT SURVEYS (eval):  Stroke Impact Scale: 45/80                                                                                                                             TREATMENT DATE: 04/06/24 Gait training:   Gait with 3# AW x 500 ft, cues for foot clearance.   TA- To improve functional movements patterns for everyday tasks   Blaze pod activity incorporating work related task x 5 min pickign up objects 25-35 lb and carrying around clinic to next blaze pod. 3# AW  donned as well. High difficulty with L UE grip strength at times, particularly with wooded weighted box.   Standing LUE row with rope handle x 12.5# x 10 reps   Standing B UE row 2 x 10 with  22.5#   Squat and lift box with 25# inside into chair and back down 2 x 5 reps LUE fatigue with reps, no reports of pain or discomfort   Large sidestep with GTB around ankles with UE support x 10 ea LE   PATIENT EDUCATION: Education details: Pt has made remarkable toward ADL based goals, however a massive gap remains between activities he has done here in clinic and what he will need to do to prepare for return to work duties.  Person educated: Patient Education method: Explanation, Demonstration, and Handouts Education comprehension: verbalized understanding and returned demonstration  HOME EXERCISE PROGRAM: Access Code: [email or web address]  URL: [email or web address]  Date: 02/17/2024  Prepared by: Darryle Patten  Exercises: - Seated March  - 1 x daily - 3 x weekly - 3 sets - 10 reps  - Standing March with Counter Support  - 1 x daily - 3 x weekly - 3 sets - 10 reps  - Mini Squat with Counter Support  - 1 x daily - 3 x weekly - 3 sets - 10 reps  - Heel Toe Raises with Counter Support  - 1 x daily - 3 x weekly - 3 sets - 10 reps   GOALS: Goals reviewed with patient? Yes  SHORT TERM GOALS: Target date: 03/09/2024  Patient will be independent in home exercise program to improve strength/mobility for better functional independence with ADL. Baseline: HEP reviewed during eval Goal status: ACHIEVED  LONG TERM GOALS: Target date: 05/11/2024  Patient will increase their Stroke Impact Scale score by >10 points for improved function and independence Baseline: 45/80 8/13: 70/80 Goal status: MET   2.  Patient (< 55 years old) will complete five times sit to stand test in < 10 seconds indicating an increased LE strength and improved balance. Baseline: 17.9 seconds with UE support; 03/29/24: 11.7 hands free Goal status: progress made, goal not met   3.  Patient will increase 10 meter walk test to >0.8 m/s as to improve gait speed for better community ambulation and to reduce fall  risk. Baseline: 0.56 m/s with 5TT & 0.73 m/s with no AD ; 8/12: 1.3m/s  Goal status: ACHIEVED  4.  Patient will increase Berg Balance score by > 6 points to demonstrate decreased fall risk during functional activities. Baseline: 33/56; 03/30/24: 54/56 Goal status: ACHIEVED,    5.  Patient will increase Functional Gait Assessment score to >20/30 as to reduce fall risk and improve dynamic gait safety with community ambulation. Baseline:18/30 on 02/25/24; 03/30/24: 27/30 Goal status: ACHIEVED  6.  Patient will increase six minute walk test distance to > 15108ft ft for progression to limited community Ambulator and improved independence with access to community and occupation  Baseline: 744ft with 4WW on 02/25/24; 03/11/24: 921ft c SPC(MET >75ft improvement)  Goal status: MET: adjusted. NEW.   7. Pt will increase SLS to > 20 sec bil to improve safety with ADLs and IADLs including showering, shopping, and work related tasks.   Baseline: 8/13: 4 sec on the R, 3 sec on the L   Goal status NEW.   ASSESSMENT:  CLINICAL IMPRESSION:  Physical therapy treatment session focused on dynamic functional ambulation training as well as some simulated work-related activities involving picking up and carrying objects  between 25 and 35 pounds.  Patient has most difficulty with carrying objects that involve holding with his left upper extremity.  Physical therapist also designed several interventions to assist with this with some functional strengthening incorporated as well.  Patient will continue to benefit from skilled physical therapy in order to address his impairments, and improve his ability to return to work-related activities, and to improve his overall quality of life.  OBJECTIVE IMPAIRMENTS: Abnormal gait, decreased activity tolerance, decreased balance, decreased coordination, decreased endurance, decreased mobility, difficulty walking, decreased ROM, decreased strength, hypomobility, impaired perceived  functional ability, impaired flexibility, impaired UE functional use, improper body mechanics, and postural dysfunction.   ACTIVITY LIMITATIONS: carrying, lifting, bending, sitting, standing, squatting, stairs, transfers, bathing, reach over head, hygiene/grooming, and locomotion level  PARTICIPATION LIMITATIONS: meal prep, cleaning, laundry, driving, shopping, community activity, occupation, and yard work  PERSONAL FACTORS: Age and Fitness are also affecting patient's functional outcome.   REHAB POTENTIAL: Good  CLINICAL DECISION MAKING: Evolving/moderate complexity  EVALUATION COMPLEXITY: Moderate  PLAN:  PT FREQUENCY: 2x/week  PT DURATION: 12 weeks  PLANNED INTERVENTIONS: [id number]- PT Re-evaluation, 97750- Physical Performance Testing, 97110-Therapeutic exercises, 97530- Therapeutic activity, [id number]- Neuromuscular re-education, 97535- Self Care, [id number]- Manual therapy, [id number]- Gait training, [phone]- Orthotic Initial, [phone]- Orthotic/Prosthetic subsequent, [phone]- Canalith repositioning, [id number]- Splinting, [id number]- Electrical stimulation (manual), [id number]- Ultrasound, [id number]- Traction (mechanical), [phone] (1-2 muscles), [id number] (3+ muscles)- Dry Needling, Patient/Family education, Balance training, Stair training, Taping, Joint mobilization, Spinal mobilization, Vestibular training, Visual/preceptual remediation/compensation, Cognitive remediation, DME instructions, Cryotherapy, and Moist heat  PLAN FOR NEXT SESSION:  - continue working on job related duties to improve stamina and lifting+mobility/balance abilities - SLS   Note: Portions of this document were prepared using Conservation officer, historic buildings and although reviewed may contain unintentional dictation errors in syntax, grammar, or spelling.  Lonni KATHEE Gainer PT ,DPT Physical Therapist- [facility]   1:48 PM 04/06/24

## 2024-04-06 NOTE — Therapy (Signed)
 OUTPATIENT OCCUPATIONAL THERAPY NEURO TREATMENT NOTE  Patient Name: Mark Mcintosh MRN: [id number] DOB:1970-11-17, 53 y.o., male Today's Date: 04/06/2024  PCP: No PCP REFERRING PROVIDER: [name], PA-C  END OF SESSION:   OT End of Session - 04/06/24 1935     Visit Number 11    Number of Visits 24    Date for OT Re-Evaluation 05/11/24    OT Start Time 1400    OT Stop Time 1445    OT Time Calculation (min) 45 min    Activity Tolerance Patient tolerated treatment well    Behavior During Therapy [facility] for tasks assessed/performed             Past Medical History:  Diagnosis Date   Arthritis    Hypertension    Stroke (HCC) 01/09/2024   Stated he had 2 strokes.   Past Surgical History:  Procedure Laterality Date   BREAST BIOPSY Right 03/15/2024   US  RT BREAST BX W LOC DEV 1ST LESION IMG BX SPEC US  GUIDE 03/15/2024 [facility]   TEE WITHOUT CARDIOVERSION N/A 01/14/2024   Procedure: ECHOCARDIOGRAM, TRANSESOPHAGEAL;  Surgeon: Alluri, Keller BROCKS, MD;  Location: [facility] ORS;  Service: Cardiovascular;  Laterality: N/A;   Patient Active Problem List   Diagnosis Date Noted   Hemiparesis affecting left side as late effect of cerebrovascular accident (HCC) 04/01/2024   Mass of lower outer quadrant of right breast 02/27/2024   History of cerebrovascular accident (CVA) with residual deficit 02/27/2024   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) 02/27/2024   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) 02/27/2024   Gait abnormality 02/27/2024   Left-sided weakness 02/27/2024   Prediabetes 02/27/2024   Abnormal pulse oximetry 02/27/2024   Vitamin D  deficiency 02/27/2024   Bradycardia 02/27/2024   Depression, major, single episode, moderate (HCC) 02/27/2024   Coping style affecting medical condition 01/20/2024   Cerebrovascular accident (CVA) (HCC) 01/14/2024   Acute CVA (cerebrovascular accident) (HCC) 01/09/2024   Tobacco use  disorder 01/09/2024   Obesity (BMI 30-39.9) 01/09/2024   Hypertension    ONSET DATE: 01/09/24  REFERRING DIAG: I63.9 (ICD-10-CM) - Acute CVA (cerebrovascular accident) (HCC)   THERAPY DIAG:  Muscle weakness (generalized)  Other lack of coordination  Acute CVA (cerebrovascular accident) (HCC)  Rationale for Evaluation and Treatment: Rehabilitation  SUBJECTIVE:  SUBJECTIVE STATEMENT: Pt reports concern for his job as his FMLA has just ended and he acknowledges that he wouldn't be able to fulfill his current job responsibilities at present.   Pt accompanied by: self  PERTINENT HISTORY: Per chart:  Mark Mcintosh is a 53 y.o. right-handed male with history significant for bilateral conjunctival inflammation hypertension as well as tobacco use and class II morbid obesity with BMI 36.26.  Per chart review patient lives alone independent prior to admission.  Presented to [facility] 01/09/2024 with acute onset of left-sided weakness.  MRI showed acute infarct in the posterior limb of the right internal capsule, the overlying right frontal white matter and pons.  Multiple remote infarcts and chronic microvascular ischemic disease.  CTA showed no large vessel occlusion.  Admission chemistries unremarkable except potassium 3.1.  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  TEE with small PFO per cardiology services with no plan for closure.  Neurology follow-up placed on aspirin  and Plavix  for CVA prophylaxis x 3 weeks then aspirin  alone.  Lovenox  for DVT prophylaxis but bilateral Doppler studies negative.  Therapy evaluations completed due to patient decreased functional mobility left-sided weakness was admitted  for a comprehensive rehab program.   PRECAUTIONS: Fall  WEIGHT BEARING RESTRICTIONS: No  PAIN: No pain, just numbness in the hand  Are you having pain? 0/10  FALLS: Has patient fallen in last 6 months? Yes. 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  Get back my independence.    OBJECTIVE:  Note: Objective measures were completed at Evaluation unless otherwise noted.  HAND DOMINANCE: Right  ADLs: Overall ADLs: Son was staying with pt for about a week after d/c from inpatient rehab, but has since returned home.  Friend comes by every other day to check in.   Transfers/ambulation related to ADLs: modified indep with rollator Eating: increased difficulty with cutting food  Grooming: increased difficulty with clipping nails, otherwise manages fine with dominant/unaffected hand UB Dressing: increased time with clothing fasteners LB Dressing: increased time with clothing fasteners  Toileting: increased time to engage L hand into clothing management Bathing: pt sits on shower chair to wash LEs, some SOB with standing in shower Tub Shower transfers: Modified indep Equipment: Shower seat without back  IADLs: Shopping: motorized cart, can push cart for shorter shopping trips.  Light housekeeping: extra time and cautions to avoid falls, sometimes feels SOB with laundry  Meal Prep: friend helps with stove top cooking, pt can do light hot and cold meal prep  Community mobility: rollator, friend drove pt  Medication management: indep  Landscape architect: indep  Handwriting: NT; L non-dominant hand affected   MOBILITY STATUS: Hx of falls, rollator or funiture/wall walking in the home, rollator for community  POSTURE COMMENTS:  L sided hemiparesis   ACTIVITY TOLERANCE: Activity tolerance: Pt reports some dyspnea with prolonged standing/mobility  UPPER EXTREMITY ROM:  BUEs WFL  UPPER EXTREMITY  MMT:     MMT Right eval Left eval Left 03/31/24  Shoulder flexion 5 4- 4-  Shoulder abduction 5 4- 4-  Shoulder adduction     Shoulder extension     Shoulder internal rotation   4-  Shoulder external rotation   3+  Middle trapezius     Lower trapezius     Elbow flexion 5 4- 4+  Elbow extension 5 4- 4+  Wrist flexion 5 4- 4+  Wrist extension 5 4- 4  Wrist ulnar deviation     Wrist radial deviation     Wrist pronation 5 4- 4+  Wrist supination 5 4- 4+  (Blank rows = not tested)  HAND FUNCTION: Eval: Grip strength: Right: 111 lbs; Left: 51 lbs, Lateral pinch: Right: 23 lbs, Left: 13 lbs, and 3 point pinch: Right: 26 lbs, Left: 13 lbs 03/31/24: Grip strength: Right: 101 lbs, Left: 31 lbs, lateral pinch: Right: 23 lbs, Left: 13 lbs, 3 point pinch: Right: 25 lbs, Left: 14 lbs  04/06/24: Grip strength: Left: 32 lbs  COORDINATION: Finger Nose Finger test: increased time and decreased accuracy on the L  9 Hole Peg test: Right: 25 sec; Left: 1 min 27 sec 03/31/24: L 52 sec, 1 min 4 sec, 39 sec   SENSATION: Pt reports increased tingling/numbness when hand balls  up a little, but when he straightens it out it goes back to normal.   EDEMA: no visible edema   MUSCLE TONE: LUE: Mild   COGNITION: Overall cognitive status: Within functional limits for tasks assessed pt reportme difficulties with memory   VISION:             Subjective report: Wears glasses all the time.  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                                                                                                              TREATMENT DATE: 04/06/24 Therapeutic Activity: -Facilitated LUE FMC/GMC activities with placement of jumbo pegs into pegboard which was elevated from table top. -Facilitated progressive and sustained grip strengthening working with hand gripper set at 17.9# for 2 trials to remove jumbo pegs from pegboard and drop into container.  Container position moved to facilitate extended lateral reaching, progressing to having pt amb ~3 feet from table to drop pegs into container, requiring progressive sustained grip.  Setting was reduced to 11.2# for this 3rd trial which involved ambulation. -Facilitated L hand, wrist, and forearm strengthening working with Ezboard tools: large and small based key turn, large and small dowel turns x3 reps up/down wide strip of velcro, and velcro block x10 pulls against wide strip of velcro.  -Facilitated L hand grip strengthening in combination with wrist flex/ext against resistance of forearm bar x2 sets 10 reps each   Self Care: -Review of job responsibilities to target future OT sessions -Encouraged pt contact PCP to request earlier follow up visit if possible since FMLA has expired -Encouraged pt follow up with work to determine options for alternate job responsibilities  PATIENT EDUCATION: Education details: HEP review; Marketing executive   Person educated: Patient Education method: Leisure centre manager cues Education comprehension: verbalized understanding  HOME EXERCISE PROGRAM: Green theraputty 2-3x per day for 5-10 min periods for L hand grip strengthening, FMC handout  GOALS: Goals reviewed with patient? Yes  SHORT TERM GOALS: Target date: 03/30/24  Pt will be indep to perform HEP for increasing strength and coordination throughout the LUE. Baseline: Eval: Not yet initiated; 03/31/24: Indep with HEP and verbalizes understanding of progressions made today for frequency Goal status:  achieved  LONG TERM GOALS: Target date: 05/11/24  Pt will increase LUE MMT by 1/2 grade or more to increase engagement of LUE into ADL/IADLs. Baseline: Eval: LUE grossly 4-/5 throughout (R 5/5); 03/31/24: L shoulder grossly 4-, elbow flex 4+, ext 4, forearm pron/sup 4+ Goal status: in progress  2.  Pt will increase L grip strength by 20 lbs or more to enable pt to grasp and carry  heavy ADL supplies in L hand. Baseline: Eval: L 51 lbs (R 111 lbs); 03/31/24: L grip 31 lbs (R 101 lbs) Goal status: in progress  3.  Pt will increase L hand [facility] skills as demonstrated by completion of 9 hole peg test in <1 min to improve manipulation of small ADL supplies. Baseline: Eval: L 1 min 27 sec (R 25 sec); 03/31/24: 3 trials completed today: L 52 sec, 1 min 4 sec, 39 sec  Goal status: in progress  4.  Pt will increase LUE GMC to enable confidence with moving hot pots/pans on/off stove top and in/out of the oven using BUEs. Baseline: Eval: Pt reports that his friend currently assists with heavier meal prep; 03/31/24: Pt is now cooking independently, but with increased time/caution Goal status: in progress  ASSESSMENT:  CLINICAL IMPRESSION: Pt reports diligence with L hand grip strengthening at home after pt was encouraged to increase putty use to 2-3x daily after progress was assessed last visit, and grip strength was observed to have declined, despite all other measures for coordination greatly improving.  Pt voiced concerns today about FMLA recently ending.  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  Pt's left grip strength is still only 32 lbs (hemiparetic limb), as compared to his R grip which is 101 lbs.  Pt reports that he was attempting to lift 25 lb boxes in PT today prior to OT session, and  his L hand kept slipping from the box.  OT has encouraged pt reach out to PCP to discuss return to work options vs possibility of exploring long term disability options.  Pt's LUE coordination continues to improve, but pt still presents with mild-moderate LUE ataxia, which would not be conducive to working with chemicals and dyes at present d/t spill risk.  OT will reach out to PCP to communicate pt's work related concerns, as OT has communicated to pt that return to work decisions must come from MD, with therapy offering communication to MD re: current physical abilities.  Pt in agreement with recommendation to reach out to MD and work.  Pt will continue to benefit from skilled OT to work towards above noted goals for improving indep with daily tasks, return to work, reduce burden of care on caregivers, and improve QOL.    PERFORMANCE DEFICITS: in functional skills including ADLs, IADLs, coordination, dexterity, sensation, tone, strength, Fine motor control, Gross motor control, mobility, balance, body mechanics, endurance, decreased knowledge of precautions, decreased knowledge of use of DME, vision, and UE functional use, cognitive skills including memory, and psychosocial skills including coping strategies, environmental adaptation, and routines and behaviors.   IMPAIRMENTS: are limiting patient from ADLs, IADLs, work, and leisure.   CO-MORBIDITIES: has co-morbidities such as tobacco use disorder, HTN, arthritis that affects occupational performance. Patient will benefit from skilled OT to address above impairments and improve overall function.  MODIFICATION OR ASSISTANCE TO COMPLETE EVALUATION: No modification of tasks or assist necessary to complete an evaluation.  OT OCCUPATIONAL PROFILE AND HISTORY: Detailed assessment: Review of records and additional review of physical, cognitive, psychosocial history related to current functional performance.  CLINICAL DECISION MAKING: Moderate - several  treatment options, min-mod task modification necessary  REHAB POTENTIAL: Good  EVALUATION COMPLEXITY: Moderate  PLAN:  OT FREQUENCY: 2x/week  OT DURATION: 12 weeks  PLANNED INTERVENTIONS: 97168 OT Re-evaluation, 97535 self care/ADL training, 02889 therapeutic exercise, 97530 therapeutic activity, 97112 neuromuscular re-education, 97140 manual therapy, 97116 gait training, 02989 moist heat, 97010 cryotherapy, 97750 Physical Performance Testing, passive range of motion, balance training, functional mobility training, visual/perceptual remediation/compensation, psychosocial skills training, energy conservation, coping strategies training, patient/family education, and DME and/or AE instructions  RECOMMENDED OTHER SERVICES: SLP eval and treat d/t pt reporting memory deficits since CVA, drooling, and slurred speech  CONSULTED AND AGREED WITH PLAN OF CARE: Patient  PLAN FOR NEXT SESSION: see above   Inocente Blazing, MS, OTR/L   04/06/2024, 7:38 PM

## 2024-04-07 ENCOUNTER — Telehealth: Payer: Self-pay

## 2024-04-07 NOTE — Telephone Encounter (Signed)
 Copied from CRM #[id number]. Topic: General - Call Back - No Documentation >> Apr 07, 2024 12:17 PM Precious C wrote: Reason for CRM: pt called in regarding FMLA questions..the patient is requesting a callback [phone]

## 2024-04-07 NOTE — Telephone Encounter (Signed)
 Attempted to contact the pt in regards to her FMLA question. If she returns the call ok for E2C2 to ask her which questions had.

## 2024-04-08 ENCOUNTER — Ambulatory Visit

## 2024-04-08 ENCOUNTER — Ambulatory Visit: Admitting: Physical Therapy

## 2024-04-08 ENCOUNTER — Ambulatory Visit: Admitting: Speech Pathology

## 2024-04-13 ENCOUNTER — Ambulatory Visit: Admitting: Physical Therapy

## 2024-04-13 ENCOUNTER — Ambulatory Visit

## 2024-04-13 ENCOUNTER — Ambulatory Visit: Admitting: Speech Pathology

## 2024-04-13 DIAGNOSIS — R278 Other lack of coordination: Secondary | ICD-10-CM

## 2024-04-13 DIAGNOSIS — R471 Dysarthria and anarthria: Secondary | ICD-10-CM

## 2024-04-13 DIAGNOSIS — R262 Difficulty in walking, not elsewhere classified: Secondary | ICD-10-CM

## 2024-04-13 DIAGNOSIS — I639 Cerebral infarction, unspecified: Secondary | ICD-10-CM

## 2024-04-13 DIAGNOSIS — M6281 Muscle weakness (generalized): Secondary | ICD-10-CM | POA: Diagnosis not present

## 2024-04-13 DIAGNOSIS — R2681 Unsteadiness on feet: Secondary | ICD-10-CM

## 2024-04-13 DIAGNOSIS — R2689 Other abnormalities of gait and mobility: Secondary | ICD-10-CM

## 2024-04-13 NOTE — Therapy (Unsigned)
 OUTPATIENT PHYSICAL THERAPY TREATMENT  Patient Name: Mark Mcintosh MRN: [id number] DOB:08/09/1971, 53 y.o., male Today's Date: 04/13/2024  PCP: None REFERRING PROVIDER: [name], PA-C  END OF SESSION:   PT End of Session - 04/13/24 1316     Visit Number 13    Number of Visits 25    Date for PT Re-Evaluation 05/11/24    Progress Note Due on Visit 10    PT Start Time 1317    PT Stop Time 1357    PT Time Calculation (min) 40 min    Equipment Utilized During Treatment Gait belt    Activity Tolerance Patient tolerated treatment well;No increased pain    Behavior During Therapy [facility] for tasks assessed/performed             Past Medical History:  Diagnosis Date   Arthritis    Hypertension    Stroke (HCC) 01/09/2024   Stated he had 2 strokes.   Past Surgical History:  Procedure Laterality Date   BREAST BIOPSY Right 03/15/2024   US  RT BREAST BX W LOC DEV 1ST LESION IMG BX SPEC US  GUIDE 03/15/2024 [facility]   TEE WITHOUT CARDIOVERSION N/A 01/14/2024   Procedure: ECHOCARDIOGRAM, TRANSESOPHAGEAL;  Surgeon: Alluri, Keller BROCKS, MD;  Location: [facility] ORS;  Service: Cardiovascular;  Laterality: N/A;   Patient Active Problem List   Diagnosis Date Noted   Hemiparesis affecting left side as late effect of cerebrovascular accident (HCC) 04/01/2024   Mass of lower outer quadrant of right breast 02/27/2024   History of cerebrovascular accident (CVA) with residual deficit 02/27/2024   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) 02/27/2024   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) 02/27/2024   Gait abnormality 02/27/2024   Left-sided weakness 02/27/2024   Prediabetes 02/27/2024   Abnormal pulse oximetry 02/27/2024   Vitamin D  deficiency 02/27/2024   Bradycardia 02/27/2024   Depression, major, single episode, moderate (HCC) 02/27/2024   Coping style affecting medical condition 01/20/2024   Cerebrovascular accident (CVA)  (HCC) 01/14/2024   Acute CVA (cerebrovascular accident) (HCC) 01/09/2024   Tobacco use disorder 01/09/2024   Obesity (BMI 30-39.9) 01/09/2024   Hypertension     ONSET DATE: 01/09/24  REFERRING DIAG: I63.9 (ICD-10-CM) - Acute CVA (cerebrovascular accident) (HCC)   THERAPY DIAG:  Muscle weakness (generalized)  Other lack of coordination  Acute CVA (cerebrovascular accident) (HCC)  Difficulty in walking, not elsewhere classified  Unsteadiness on feet  Other abnormalities of gait and mobility  Rationale for Evaluation and Treatment: Rehabilitation  SUBJECTIVE:  SUBJECTIVE STATEMENT:  Pt reports doing well today. But reports that last week while he was carrying weighted box, he felt a pull/pop in the L hand; has experienced swelling since last week, but no pain. Reports that he managed the sweeling with cold and heat for the last few days, but still feels like it is swollen    PERTINENT HISTORY: [facility] ED 01/09/24 for progressively worsening L sided-weakness. multiple acute infarcts concerning for embolic showering. PMH includes arthritis, HTN. 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   PAIN:  Are you having pain? No  PRECAUTIONS: None  WEIGHT BEARING RESTRICTIONS: No  FALLS: Has patient fallen in last 6 months? Yes. Number of falls 1- pt reports that he tried to get up at home after returning from [facility] and he got up too fast and fell. Pt also reports stumbling on steps at home  LIVING ENVIRONMENT: Lives with: lives alone- pt's son was staying with him until this past Friday- son lives in [place], pt has friends that come by every other day Lives in: House/apartment Stairs: Yes: External: 4 steps; can reach both Has  following equipment at home: Walker - 4 wheeled and shower chair  PLOF: Independent- pt works with mixing chemicals and dyes  PATIENT GOALS: Pt wants to get back to normal and be able to return to work  OBJECTIVE:  Note: Objective measures were completed at Evaluation unless otherwise noted.  DIAGNOSTIC FINDINGS: via chart  From 01/09/24 MRI Brain W/O Contrast: IMPRESSION: 1. Acute infarcts in the posterior limb of the right internal capsule, the overlying right frontal white matter, and pons. Given involvement of multiple vascular territories, consider an embolic etiology. 2. Multiple remote infarcts and chronic microvascular ischemic disease, detailed above.  From 01/10/24 CT Angio Head Neck W W/O CM: IMPRESSION: 1. No acute intracranial hemorrhage or mass effect. 2. No large vessel occlusion, hemodynamically significant stenosis, or aneurysm in the head or neck. 3. Remote lacunar infarcts in the genu of the left internal capsule, globus pallidus, and right corona radiata. 4. Acute infarct involving the posterior limb of the right internal capsule is less well appreciated at CT. 5. Moderately advanced periventricular white matter disease for age.   FUNCTIONAL TESTS (at eval):  5 times sit-to-stand: 17.9 seconds, pt utilizing arms on knees to assist with standing : 0.56 m/s with 4WW and CGA; 0.73 m/s with no AD and CGA BERG Balance Scale: 33/56 FGA: 18/30 on 02/25/24 : 795 ft with 4WW on 02/25/24  PATIENT SURVEYS (eval):  Stroke Impact Scale: 45/80                                                                                                                             TREATMENT DATE: 04/13/24 Gait training:   Gait without AD x 486ft cues for foot clearance. With fatigue on the LUE.   NMR/TA.  Standing at rail:  SLS  2 x 25 sec with intermittent light UE support on rail  Reciprocal forward lunge to 12inch step x 12 bil  Lateral lunge to 12inch step x 12 bil    Sit<>stand 8 x 3 bouts with UE across chest Reverse lunge x 12 bil with cues for improved hip hinge and knee flexion with fatigue  Tandem stance 25sec x 2 bouts bil with no UE except when mild LOB to the L requiring UE support to correct   Stair ascent forward/descent reverse with step through pattern to improve muscle activation and motor plan variation 4steps x 3 bouts   PATIENT EDUCATION: Education details: Pt has made remarkable toward ADL based goals, however a massive gap remains between activities he has done here in clinic and what he will need to do to prepare for return to work duties.  Person educated: Patient Education method: Explanation, Demonstration, and Handouts Education comprehension: verbalized understanding and returned demonstration  HOME EXERCISE PROGRAM: Access Code: [email or web address]  URL: [email or web address]  Date: 02/17/2024  Prepared by: Darryle Patten  Exercises: - Seated March  - 1 x daily - 3 x weekly - 3 sets - 10 reps  - Standing March with Counter Support  - 1 x daily - 3 x weekly - 3 sets - 10 reps  - Mini Squat with Counter Support  - 1 x daily - 3 x weekly - 3 sets - 10 reps  - Heel Toe Raises with Counter Support  - 1 x daily - 3 x weekly - 3 sets - 10 reps   GOALS: Goals reviewed with patient? Yes  SHORT TERM GOALS: Target date: 03/09/2024  Patient will be independent in home exercise program to improve strength/mobility for better functional independence with ADL. Baseline: HEP reviewed during eval Goal status: ACHIEVED  LONG TERM GOALS: Target date: 05/11/2024  Patient will increase their Stroke Impact Scale score by >10 points for improved function and independence Baseline: 45/80 8/13: 70/80 Goal status: MET   2.  Patient (< 34 years old) will complete five times sit to stand test in < 10 seconds indicating an increased LE strength and improved balance. Baseline: 17.9 seconds with UE support; 03/29/24: 11.7 hands free Goal  status: progress made, goal not met   3.  Patient will increase 10 meter walk test to >0.8 m/s as to improve gait speed for better community ambulation and to reduce fall risk. Baseline: 0.56 m/s with 5TT & 0.73 m/s with no AD ; 8/12: 1.69m/s  Goal status: ACHIEVED  4.  Patient will increase Berg Balance score by > 6 points to demonstrate decreased fall risk during functional activities. Baseline: 33/56; 03/30/24: 54/56 Goal status: ACHIEVED,    5.  Patient will increase Functional Gait Assessment score to >20/30 as to reduce fall risk and improve dynamic gait safety with community ambulation. Baseline:18/30 on 02/25/24; 03/30/24: 27/30 Goal status: ACHIEVED  6.  Patient will increase six minute walk test distance to > 1542ft ft for progression to limited community Ambulator and improved independence with access to community and occupation  Baseline: 741ft with 4WW on 02/25/24; 03/11/24: 926ft c SPC(MET >52ft improvement)  Goal status: MET: adjusted. NEW.   7. Pt will increase SLS to > 20 sec bil to improve safety with ADLs and IADLs including showering, shopping, and work related tasks.   Baseline: 8/13: 4 sec on the R, 3 sec on the L   Goal status NEW.   ASSESSMENT:  CLINICAL IMPRESSION:  Physical therapy treatment session focused  on functional strengthening and movement patterns to improve safety with community access. Pt reports pain in the L hand following last PT session, so no carry tasks performed on this day. Tolerated all interventions well with intermittent rest breaks due to mild BLE fatigue and SOB. Pt will benefit from continued therapy in order to address his impairments, and improve his ability to return to work-related activities, and to improve his overall quality of life.  OBJECTIVE IMPAIRMENTS: Abnormal gait, decreased activity tolerance, decreased balance, decreased coordination, decreased endurance, decreased mobility, difficulty walking, decreased ROM, decreased strength,  hypomobility, impaired perceived functional ability, impaired flexibility, impaired UE functional use, improper body mechanics, and postural dysfunction.   ACTIVITY LIMITATIONS: carrying, lifting, bending, sitting, standing, squatting, stairs, transfers, bathing, reach over head, hygiene/grooming, and locomotion level  PARTICIPATION LIMITATIONS: meal prep, cleaning, laundry, driving, shopping, community activity, occupation, and yard work  PERSONAL FACTORS: Age and Fitness are also affecting patient's functional outcome.   REHAB POTENTIAL: Good  CLINICAL DECISION MAKING: Evolving/moderate complexity  EVALUATION COMPLEXITY: Moderate  PLAN:  PT FREQUENCY: 2x/week  PT DURATION: 12 weeks  PLANNED INTERVENTIONS: [id number]- PT Re-evaluation, 97750- Physical Performance Testing, 97110-Therapeutic exercises, 97530- Therapeutic activity, [id number]- Neuromuscular re-education, 97535- Self Care, [id number]- Manual therapy, [id number]- Gait training, [phone]- Orthotic Initial, [phone]- Orthotic/Prosthetic subsequent, [phone]- Canalith repositioning, [id number]- Splinting, [phone]- Electrical stimulation (manual), [id number]- Ultrasound, [id number]- Traction (mechanical), 20560 (1-2 muscles), [id number] (3+ muscles)- Dry Needling, Patient/Family education, Balance training, Stair training, Taping, Joint mobilization, Spinal mobilization, Vestibular training, Visual/preceptual remediation/compensation, Cognitive remediation, DME instructions, Cryotherapy, and Moist heat  PLAN FOR NEXT SESSION:   - continue working on job related duties to improve stamina and lifting+mobility/balance abilities - SLS    Massie FORBES Dollar PT ,DPT Physical Therapist- [place]  [facility]   1:18 PM 04/13/24

## 2024-04-13 NOTE — Therapy (Unsigned)
 OUTPATIENT OCCUPATIONAL THERAPY NEURO TREATMENT NOTE  Patient Name: Mark Mcintosh MRN: [id number] DOB:08/30/1970, 53 y.o., male Today's Date: 04/15/2024  PCP: No PCP REFERRING PROVIDER: [name], PA-C  END OF SESSION:   OT End of Session - 04/15/24 0929     Visit Number 12    Number of Visits 24    Date for OT Re-Evaluation 05/11/24    OT Start Time 1400    OT Stop Time 1445    OT Time Calculation (min) 45 min    Activity Tolerance Patient tolerated treatment well    Behavior During Therapy [facility] for tasks assessed/performed             Past Medical History:  Diagnosis Date   Arthritis    Hypertension    Stroke (HCC) 01/09/2024   Stated he had 2 strokes.   Past Surgical History:  Procedure Laterality Date   BREAST BIOPSY Right 03/15/2024   US  RT BREAST BX W LOC DEV 1ST LESION IMG BX SPEC US  GUIDE 03/15/2024 [facility]   TEE WITHOUT CARDIOVERSION N/A 01/14/2024   Procedure: ECHOCARDIOGRAM, TRANSESOPHAGEAL;  Surgeon: Alluri, Keller BROCKS, MD;  Location: [facility] ORS;  Service: Cardiovascular;  Laterality: N/A;   Patient Active Problem List   Diagnosis Date Noted   Hemiparesis affecting left side as late effect of cerebrovascular accident (HCC) 04/01/2024   Mass of lower outer quadrant of right breast 02/27/2024   History of cerebrovascular accident (CVA) with residual deficit 02/27/2024   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) 02/27/2024   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) 02/27/2024   Gait abnormality 02/27/2024   Left-sided weakness 02/27/2024   Prediabetes 02/27/2024   Abnormal pulse oximetry 02/27/2024   Vitamin D  deficiency 02/27/2024   Bradycardia 02/27/2024   Depression, major, single episode, moderate (HCC) 02/27/2024   Coping style affecting medical condition 01/20/2024   Cerebrovascular accident (CVA) (HCC) 01/14/2024   Acute CVA (cerebrovascular accident) (HCC) 01/09/2024   Tobacco use  disorder 01/09/2024   Obesity (BMI 30-39.9) 01/09/2024   Hypertension    ONSET DATE: 01/09/24  REFERRING DIAG: I63.9 (ICD-10-CM) - Acute CVA (cerebrovascular accident) (HCC)   THERAPY DIAG:  Muscle weakness (generalized)  Other lack of coordination  Acute CVA (cerebrovascular accident) (HCC)  Rationale for Evaluation and Treatment: Rehabilitation  SUBJECTIVE:  SUBJECTIVE STATEMENT: Pt reports doing well today. Pt accompanied by: self  PERTINENT HISTORY: Per chart:  Mark Mcintosh is a 53 y.o. right-handed male with history significant for bilateral conjunctival inflammation hypertension as well as tobacco use and class II morbid obesity with BMI 36.26.  Per chart review patient lives alone independent prior to admission.  Presented to [facility]/09/2024 with acute onset of left-sided weakness.  MRI showed acute infarct in the posterior limb of the right internal capsule, the overlying right frontal white matter and pons.  Multiple remote infarcts and chronic microvascular ischemic disease.  CTA showed no large vessel occlusion.  Admission chemistries unremarkable except potassium 3.1.  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  TEE with small PFO per cardiology services with no plan for closure.  Neurology follow-up placed on aspirin  and Plavix  for CVA prophylaxis x 3 weeks then aspirin  alone.  Lovenox  for DVT prophylaxis but bilateral Doppler studies negative.  Therapy evaluations completed due to patient decreased functional mobility left-sided weakness was admitted for a comprehensive rehab program.   PRECAUTIONS: Fall  WEIGHT BEARING RESTRICTIONS: No  PAIN: No pain, just numbness in the hand  Are  you having pain? 0/10  FALLS: Has patient fallen in last 6 months? Yes. 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  Get back my independence.    OBJECTIVE:  Note: Objective measures were completed at Evaluation unless otherwise noted.  HAND DOMINANCE: Right  ADLs: Overall ADLs: Son was staying with pt for about a week after d/c from inpatient rehab, but has since returned home.  Friend comes by every other day to check in.   Transfers/ambulation related to ADLs: modified indep with rollator Eating: increased difficulty with cutting food  Grooming: increased difficulty with clipping nails, otherwise manages fine with dominant/unaffected hand UB Dressing: increased time with clothing fasteners LB Dressing: increased time with clothing fasteners  Toileting: increased time to engage L hand into clothing management Bathing: pt sits on shower chair to wash LEs, some SOB with standing in shower Tub Shower transfers: Modified indep Equipment: Shower seat without back  IADLs: Shopping: motorized cart, can push cart for shorter shopping trips.  Light housekeeping: extra time and cautions to avoid falls, sometimes feels SOB with laundry  Meal Prep: friend helps with stove top cooking, pt can do light hot and cold meal prep  Community mobility: rollator, friend drove pt  Medication management: indep  Landscape architect: indep  Handwriting: NT; L non-dominant hand affected   MOBILITY STATUS: Hx of falls, rollator or funiture/wall walking in the home, rollator for community  POSTURE COMMENTS:  L sided hemiparesis   ACTIVITY TOLERANCE: Activity tolerance: Pt reports some dyspnea with prolonged standing/mobility  UPPER EXTREMITY ROM:  BUEs WFL  UPPER EXTREMITY MMT:     MMT Right eval Left eval Left 03/31/24  Shoulder flexion 5 4- 4-  Shoulder abduction 5 4- 4-  Shoulder adduction      Shoulder extension     Shoulder internal rotation   4-  Shoulder external rotation   3+  Middle trapezius     Lower trapezius     Elbow flexion 5 4- 4+  Elbow extension 5 4- 4+  Wrist flexion 5 4- 4+  Wrist extension 5 4- 4  Wrist ulnar deviation     Wrist radial deviation     Wrist pronation 5 4- 4+  Wrist supination 5 4- 4+  (Blank rows = not tested)  HAND FUNCTION: Eval: Grip strength: Right: 111 lbs; Left: 51 lbs, Lateral pinch: Right: 23 lbs, Left: 13 lbs, and 3 point pinch: Right: 26 lbs, Left: 13 lbs 03/31/24: Grip strength: Right: 101 lbs, Left: 31 lbs, lateral pinch: Right: 23 lbs, Left: 13 lbs, 3 point pinch: Right: 25 lbs, Left: 14 lbs  04/06/24: Grip strength: Left: 32 lbs 04/13/24: Grip strength: Left: 45 lbs    COORDINATION: Finger Nose Finger test: increased time and decreased accuracy on the L  9 Hole Peg test: Right: 25 sec; Left: 1 min 27 sec 03/31/24: L 52 sec, 1 min 4 sec, 39 sec   SENSATION: Pt reports increased tingling/numbness when hand balls up a little, but when he straightens it out it goes back to normal.   EDEMA:  no visible edema   MUSCLE TONE: LUE: Mild   COGNITION: Overall cognitive status: Within functional limits for tasks assessed pt reportme difficulties with memory   VISION:             Subjective report: Wears glasses all the time.  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                                                                                                             TREATMENT DATE:  04/13/24 Therapeutic Activity: -Facilitated LUE GMC/FMC working to place and remove therapy resistant clothespins (all colors) on/off vertical and horizontal dowels.  Pt practiced quick/alternating reps of pron/sup to move pins on/off a horizontal dowel, and forward and lateral reaching patterns to move clips on/off a vertical dowel, performing 2 sets with a lateral pinch position, and 2 sets with a 3 point pinch position; min vc to formulate pinch patterns.  -Facilitated L hand, wrist, and forearm strengthening working with Ezboard tools: large and small based key turn, large and small dowel turns x3 reps up/down wide strip of velcro, and velcro block x10 pulls against wide strip of velcro.    Therapeutic Exercise: -L grip strengthening: Hand gripper set at moderate resistance with 2 red bands.  3 sets 10 reps, min vc for hand positioning and technique.  Rest between sets and min vc for self passive wrist and digit ext stretching upon completion of gripping sets.  PATIENT EDUCATION: Education details: LUE GMC/FMC activities  Person educated: Patient Education method: Explanation and Verbal cues Education comprehension: verbalized understanding  HOME EXERCISE PROGRAM: Green theraputty 2-3x per day for 5-10 min periods for L hand grip strengthening, FMC handout  GOALS: Goals reviewed with patient? Yes  SHORT TERM GOALS: Target date: 03/30/24  Pt will be indep to perform HEP for increasing strength and coordination throughout the LUE. Baseline: Eval: Not yet initiated; 03/31/24: Indep with HEP and verbalizes understanding of progressions made today for frequency Goal status: achieved  LONG TERM GOALS: Target date: 05/11/24  Pt will increase LUE MMT by 1/2 grade or more to increase engagement of LUE into ADL/IADLs. Baseline: Eval: LUE grossly 4-/5 throughout (R 5/5); 03/31/24: L shoulder grossly 4-, elbow flex 4+, ext 4, forearm pron/sup 4+ Goal status: in progress  2.  Pt will increase L  grip strength by 20 lbs or more to enable pt to grasp and carry heavy ADL supplies in L hand. Baseline: Eval: L 51 lbs (R 111 lbs); 03/31/24: L grip 31 lbs (R 101 lbs) Goal status: in progress  3.  Pt will increase L hand [facility] skills as demonstrated by completion of 9 hole peg test in <  1 min to improve manipulation of small ADL supplies. Baseline: Eval: L 1 min 27 sec (R 25 sec); 03/31/24: 3 trials completed today: L 52 sec, 1 min 4 sec, 39 sec  Goal status: in progress  4.  Pt will increase LUE GMC to enable confidence with moving hot pots/pans on/off stove top and in/out of the oven using BUEs. Baseline: Eval: Pt reports that his friend currently assists with heavier meal prep; 03/31/24: Pt is now cooking independently, but with increased time/caution Goal status: in progress  ASSESSMENT:  CLINICAL IMPRESSION: Pt continues to report diligence with L hand grip strengthening at home after pt was encouraged to increase putty use to 2-3x daily.  L grip strength measured at 32 lbs last week, and 45 lbs this week, though still reduced from initial eval which was measured at 51 lbs.  Pt continues to present with mild-moderate ataxia, but is showing increased control with reaching toward a target during forward and lateral reaching, but with increased difficulty when alternating between pron/sup (ie moving clips on/off a horiz dowel while alternating between these movements).  Pt will continue to benefit from skilled OT to work towards above noted goals for improving indep with daily tasks, return to work, reduce burden of care on caregivers, and improve QOL.    PERFORMANCE DEFICITS: in functional skills including ADLs, IADLs, coordination, dexterity, sensation, tone, strength, Fine motor control, Gross motor control, mobility, balance, body mechanics, endurance, decreased knowledge of precautions, decreased knowledge of use of DME, vision, and UE functional use, cognitive skills including memory, and  psychosocial skills including coping strategies, environmental adaptation, and routines and behaviors.   IMPAIRMENTS: are limiting patient from ADLs, IADLs, work, and leisure.   CO-MORBIDITIES: has co-morbidities such as tobacco use disorder, HTN, arthritis that affects occupational performance. Patient will benefit from skilled OT to address above impairments and improve overall function.  MODIFICATION OR ASSISTANCE TO COMPLETE EVALUATION: No modification of tasks or assist necessary to complete an evaluation.  OT OCCUPATIONAL PROFILE AND HISTORY: Detailed assessment: Review of records and additional review of physical, cognitive, psychosocial history related to current functional performance.  CLINICAL DECISION MAKING: Moderate - several treatment options, min-mod task modification necessary  REHAB POTENTIAL: Good  EVALUATION COMPLEXITY: Moderate  PLAN:  OT FREQUENCY: 2x/week  OT DURATION: 12 weeks  PLANNED INTERVENTIONS: 97168 OT Re-evaluation, 97535 self care/ADL training, 02889 therapeutic exercise, 97530 therapeutic activity, 97112 neuromuscular re-education, 97140 manual therapy, 97116 gait training, 02989 moist heat, 97010 cryotherapy, 97750 Physical Performance Testing, passive range of motion, balance training, functional mobility training, visual/perceptual remediation/compensation, psychosocial skills training, energy conservation, coping strategies training, patient/family education, and DME and/or AE instructions  RECOMMENDED OTHER SERVICES: SLP eval and treat d/t pt reporting memory deficits since CVA, drooling, and slurred speech  CONSULTED AND AGREED WITH PLAN OF CARE: Patient  PLAN FOR NEXT SESSION: see above   Inocente Blazing, MS, OTR/L   04/15/2024, 9:31 AM

## 2024-04-13 NOTE — Therapy (Signed)
 OUTPATIENT SPEECH LANGUAGE PATHOLOGY  SWALLOW and DYSARTHRIA TREATMENT NOTE   Patient Name: Mark Mcintosh MRN: [id number] DOB:1971/01/30, 53 y.o., male Today's Date: 04/13/2024  PCP: Curtis Boom, FNP REFERRING PROVIDER: Fidela Ned, NP   End of Session - 04/13/24 1442     Visit Number 8    Number of Visits 17    Date for SLP Re-Evaluation 04/27/24    Authorization Type [employer]    Progress Note Due on Visit 10    SLP Start Time 1445    Activity Tolerance Patient tolerated treatment well          Past Medical History:  Diagnosis Date   Arthritis    Hypertension    Stroke (HCC) 01/09/2024   Stated he had 2 strokes.   Past Surgical History:  Procedure Laterality Date   BREAST BIOPSY Right 03/15/2024   US  RT BREAST BX W LOC DEV 1ST LESION IMG BX SPEC US  GUIDE 03/15/2024 [facility]   TEE WITHOUT CARDIOVERSION N/A 01/14/2024   Procedure: ECHOCARDIOGRAM, TRANSESOPHAGEAL;  Surgeon: Alluri, Keller BROCKS, MD;  Location: [facility] ORS;  Service: Cardiovascular;  Laterality: N/A;   Patient Active Problem List   Diagnosis Date Noted   Hemiparesis affecting left side as late effect of cerebrovascular accident (HCC) 04/01/2024   Mass of lower outer quadrant of right breast 02/27/2024   History of cerebrovascular accident (CVA) with residual deficit 02/27/2024   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) 02/27/2024   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) 02/27/2024   Gait abnormality 02/27/2024   Left-sided weakness 02/27/2024   Prediabetes 02/27/2024   Abnormal pulse oximetry 02/27/2024   Vitamin D  deficiency 02/27/2024   Bradycardia 02/27/2024   Depression, major, single episode, moderate (HCC) 02/27/2024   Coping style affecting medical condition 01/20/2024   Cerebrovascular accident (CVA) (HCC) 01/14/2024   Acute CVA (cerebrovascular accident) (HCC) 01/09/2024   Tobacco use disorder 01/09/2024    Obesity (BMI 30-39.9) 01/09/2024   Hypertension     ONSET DATE: 01/09/2024   REFERRING DIAG:  R47.1 (ICD-10-CM) - Dysarthria  R13.10 (ICD-10-CM) - Dysphagia    THERAPY DIAG:  Dysarthria and anarthria  Rationale for Evaluation and Treatment Rehabilitation  SUBJECTIVE:   PERTINENT HISTORY and DIAGNOSTIC FINDINGS: Pt is a right handed 53 year old male with past medical history of hypertension and tobacco use disorder. He presented [facility] ED on 01/09/2024 with left sided weakness. MRI on 01/10/2024 revealed 1. Acute infarcts in the posterior limb of the right internal  capsule, the overlying right frontal white matter, and pons. Given  involvement of multiple vascular territories, consider an embolic  etiology.  2. Multiple remote infarcts and chronic microvascular ischemic  disease. Pt received ST services at [facility] from 01/16/2024 thru 01/29/2024.   PAIN:  Are you having pain? No  FALLS: Has patient fallen in last 6 months?  No  LIVING ENVIRONMENT: Lives with: lives alone Lives in: House/apartment  PLOF:  Level of assistance: Independent with ADLs, Independent with IADLs Employment: Full-time employment  SUBJECTIVE STATEMENT: Pt pleasant, reports continued improvement/progress Pt accompanied by: self  PATIENT GOALS  to improve speech and swallow abilities  OBJECTIVE:   TODAY'S TREATMENT NOTE: Skilled treatment session targeted pt's dysarthria and dysphagia goals. SLP facilitated session by providing the following interventions:  Pt with great speech intelligibility when communicating unknown information at the conversation level (>98%) with Mod I use of strategies. Pt with one instance of decreased speech intelligibility that prevented  listener from understanding.    The Communication Effectiveness Survey is a patient-reported outcome measure in which the patient rates their own effectiveness in different communication situations. A higher score indicates greater  effectiveness.   Pt's self-rating was 32/32.   Having a conversation with a family member or friends at home. 4- Very Effective Participating in conversation with strangers in a quiet place. 4- Very Effective Conversing with a familiar person over the telephone. 4- Very Effective Conversing with a stranger over the telephone. 4- Very Effective Being part of a conversation in a noisy environment (social gathering). 4- Very Effective Speaking to a friend when you are emotionally upset or you are angry. 4- Very Effective Having a conversation while traveling in a car. 4- Very Effective Having a conversation with someone at a distance (across a room). 4- Very Effective  Pt's score is improved from initial score of 28/32 on 03/02/2024.   PATIENT EDUCATION: Education details: see above Person educated: Patient Education method: Explanation and written cues Education comprehension: verbalized understanding and needs further education  HOME EXERCISE PROGRAM:     Aspiration Precautions  Compensatory Swallow strategies  Complete EMST   GOALS: Goals reviewed with patient? Yes  SHORT TERM GOALS: Target date: 10 sessions  With Mod I, pt will keep food and liquid in mouth while eating without losing the bolus out of the front of the mouth to safely consume least restrictive diet. Baseline: Goal status: INITIAL  2.  With Mod I, pt will complete lingual strengthening exercises (anterior and posterior) x 15 times in order to increase muscle strength to reduce oral residue given regular textures.  Baseline:  Goal status: INITIAL  3.  With Mod I, patient will complete 3 sets of 10 repetitions with EMST set at 100cmH2O with self-reported effortful of 7 out of 10. Baseline:  Goal status: INITIAL  4.  With Mod I, patient will use dysarthria compensations (slow, loud, overpronounce, pause) in 2-3 sentence responses with 95% accuracy. Baseline:  Goal status: INITIAL   LONG TERM GOALS: Target  date: 04/27/2024  With Mod I, pt will utilize safe swallwoing strategies in order to reduce risk of aspiration, dehydration and weight loss with consuming regular textures. Baseline:  Goal status: INITIAL  2.  With Mod I, pt will demonstrate the ability to adequately self-monitor swallowing skills and perform appropriate compensatory techniques to reduce s/s of aspiration and to safely consume least restrictive diet.     Baseline:  Goal status: INITIAL  3.  With Mod I, patient will improve speech intelligibility for  paragraph by controlling rate of speech, over-articulation, and increased loudness to achieve 95% intelligibility.  Baseline:  Goal status: INITIAL   ASSESSMENT:  CLINICAL IMPRESSION: Patient is a 53 year old right handed male who was seen today for an dysarthria and dysphagia treatment d/t recent right internal capsule and pons CVA.    Pt presents with mild to moderate flaccid dysarthria d/t left side facial and lingual weakness that in reduced lip seal (during production of plosives) and imprecise lingual articulation. Pt's vocal quality was perceptionally wetter the longer that he talked. He reports that I start slobbering on the left side when I talk a lot.  Pt presents with mild oropharyngeal dysphagia that is c/b reduced lingual manipulation of boluses and left labial spillage of liquids. Pt reports my swallowing is decent but when I drink water it comes out on the left side. He reports drinking from a large insulated cup rim.   Pt  eager and good response to therapy; improved speech intelligibility as a result. See the above treatment note for details.    OBJECTIVE IMPAIRMENTS include dysarthria and dysphagia. These impairments are limiting patient from return to work, effectively communicating at home and in community, and safety when swallowing. Factors affecting potential to achieve goals and functional outcome are Multiple remote infarcts and chronic  microvascular ischemic. Patient will benefit from skilled SLP services to address above impairments and improve overall function.  REHAB POTENTIAL: Good  PLAN: SLP FREQUENCY: 1-2x/week  SLP DURATION: 8 weeks  PLANNED INTERVENTIONS: Aspiration precaution training, Pharyngeal strengthening exercises, Diet toleration management , Trials of upgraded texture/liquids, Oral motor exercises, SLP instruction and feedback, Compensatory strategies, and Patient/family education    Happi B. Rubbie, M.S., CCC-SLP, Tree surgeon Certified Brain Injury Specialist [facility]  [facility] Services Office [phone] Ascom [phone] Fax [phone]

## 2024-04-15 ENCOUNTER — Ambulatory Visit: Admitting: Physical Therapy

## 2024-04-15 ENCOUNTER — Ambulatory Visit

## 2024-04-15 ENCOUNTER — Ambulatory Visit: Admitting: Speech Pathology

## 2024-04-15 ENCOUNTER — Ambulatory Visit: Payer: Self-pay | Admitting: Neurology

## 2024-04-15 DIAGNOSIS — R278 Other lack of coordination: Secondary | ICD-10-CM

## 2024-04-15 DIAGNOSIS — R262 Difficulty in walking, not elsewhere classified: Secondary | ICD-10-CM

## 2024-04-15 DIAGNOSIS — I639 Cerebral infarction, unspecified: Secondary | ICD-10-CM

## 2024-04-15 DIAGNOSIS — R471 Dysarthria and anarthria: Secondary | ICD-10-CM

## 2024-04-15 DIAGNOSIS — R2689 Other abnormalities of gait and mobility: Secondary | ICD-10-CM

## 2024-04-15 DIAGNOSIS — M6281 Muscle weakness (generalized): Secondary | ICD-10-CM | POA: Diagnosis not present

## 2024-04-15 DIAGNOSIS — R2681 Unsteadiness on feet: Secondary | ICD-10-CM

## 2024-04-15 LAB — HYPERCOAGULABLE PANEL, COMPREHENSIVE
APTT: 27.6 s
AT III Act/Nor PPP Chro: 104 %
Act. Prt C Resist w/FV Defic.: 2.8 ratio
Anticardiolipin Ab, IgG: 18 [GPL'U]
Anticardiolipin Ab, IgM: <10 [MPL'U]
Beta-2 Glycoprotein I, IgA: <10 SAU
Beta-2 Glycoprotein I, IgG: <10 SGU
Beta-2 Glycoprotein I, IgM: <10 SMU
DRVVT Screen Seconds: 46.7 s
Factor VII Antigen**: 64 %
Factor VIII Activity: 119 %
Hexagonal Phospholipid Neutral: 3 s
Homocysteine: 5.7 umol/L
Prot C Ag Act/Nor PPP Imm: 89 %
Prot S Ag Act/Nor PPP Imm: 79 %
Protein C Ag/FVII Ag Ratio**: 1.4 ratio
Protein S Ag/FVII Ag Ratio**: 1.2 ratio

## 2024-04-15 LAB — C-REACTIVE PROTEIN: CRP: 15 mg/L — ABNORMAL HIGH (ref 0–10)

## 2024-04-15 LAB — ANA W/REFLEX IF POSITIVE: Anti Nuclear Antibody (ANA): NEGATIVE

## 2024-04-15 LAB — SEDIMENTATION RATE: Sed Rate: 49 mm/h — ABNORMAL HIGH (ref 0–30)

## 2024-04-15 NOTE — Therapy (Signed)
 OUTPATIENT PHYSICAL THERAPY TREATMENT  Patient Name: Mark Mcintosh MRN: [id number] DOB:1971/07/30, 53 y.o., male Today's Date: 04/15/2024  PCP: None REFERRING PROVIDER: [name], PA-C  END OF SESSION:   PT End of Session - 04/15/24 1058     Visit Number 14    Number of Visits 25    Date for PT Re-Evaluation 05/11/24    Progress Note Due on Visit 10    PT Start Time 1100    PT Stop Time 1142    PT Time Calculation (min) 42 min    Equipment Utilized During Treatment Gait belt    Activity Tolerance Patient tolerated treatment well;No increased pain    Behavior During Therapy [facility] for tasks assessed/performed             Past Medical History:  Diagnosis Date   Arthritis    Hypertension    Stroke (HCC) 01/09/2024   Stated he had 2 strokes.   Past Surgical History:  Procedure Laterality Date   BREAST BIOPSY Right 03/15/2024   US  RT BREAST BX W LOC DEV 1ST LESION IMG BX SPEC US  GUIDE 03/15/2024 [facility]   TEE WITHOUT CARDIOVERSION N/A 01/14/2024   Procedure: ECHOCARDIOGRAM, TRANSESOPHAGEAL;  Surgeon: Alluri, Keller BROCKS, MD;  Location: [facility] ORS;  Service: Cardiovascular;  Laterality: N/A;   Patient Active Problem List   Diagnosis Date Noted   Hemiparesis affecting left side as late effect of cerebrovascular accident (HCC) 04/01/2024   Mass of lower outer quadrant of right breast 02/27/2024   History of cerebrovascular accident (CVA) with residual deficit 02/27/2024   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) 02/27/2024   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) 02/27/2024   Gait abnormality 02/27/2024   Left-sided weakness 02/27/2024   Prediabetes 02/27/2024   Abnormal pulse oximetry 02/27/2024   Vitamin D  deficiency 02/27/2024   Bradycardia 02/27/2024   Depression, major, single episode, moderate (HCC) 02/27/2024   Coping style affecting medical condition 01/20/2024   Cerebrovascular accident (CVA)  (HCC) 01/14/2024   Acute CVA (cerebrovascular accident) (HCC) 01/09/2024   Tobacco use disorder 01/09/2024   Obesity (BMI 30-39.9) 01/09/2024   Hypertension     ONSET DATE: 01/09/24  REFERRING DIAG: I63.9 (ICD-10-CM) - Acute CVA (cerebrovascular accident) (HCC)   THERAPY DIAG:  No diagnosis found.  Rationale for Evaluation and Treatment: Rehabilitation  SUBJECTIVE:                                                                                                                                                                                             SUBJECTIVE STATEMENT:  Pt reports doing  well today. But reports that last week while he was carrying weighted box, he felt a pull/pop in the L hand; this swelling has continued to improve per his report. Reports he is excited to meet his Aunt on his fathers side for the first rime this weekend.    PERTINENT HISTORY: [facility] ED 01/09/24 for progressively worsening L sided-weakness. multiple acute infarcts concerning for embolic showering. PMH includes arthritis, HTN. 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   PAIN:  Are you having pain? No  PRECAUTIONS: None  WEIGHT BEARING RESTRICTIONS: No  FALLS: Has patient fallen in last 6 months? Yes. Number of falls 1- pt reports that he tried to get up at home after returning from [place] and he got up too fast and fell. 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  DIAGNOSTIC FINDINGS: via chart  From 01/09/24 MRI Brain W/O Contrast: IMPRESSION: 1. Acute infarcts in the posterior limb of the right internal capsule, the overlying right frontal white matter, and pons. Given involvement of multiple vascular territories, consider an embolic etiology. 2. Multiple remote infarcts and chronic microvascular ischemic disease, detailed above.  From 01/10/24 CT Angio Head Neck W W/O CM: IMPRESSION: 1. No acute intracranial hemorrhage or mass effect. 2. No large vessel occlusion, hemodynamically significant stenosis, or aneurysm in the head or neck. 3. Remote lacunar infarcts in the genu of the left internal capsule, globus pallidus, and right corona radiata. 4. Acute infarct involving the posterior limb of the right internal capsule is less well appreciated at CT. 5. Moderately advanced periventricular white matter disease for age.   FUNCTIONAL TESTS (at eval):  5 times sit-to-stand: 17.9 seconds, pt utilizing arms on knees to assist with standing : 0.56 m/s with 4WW and CGA; 0.73 m/s with no AD and CGA BERG Balance Scale: 33/56 FGA: 18/30 on 02/25/24 : 795 ft with 4WW on 02/25/24  PATIENT SURVEYS (eval):  Stroke Impact Scale: 45/80                                                                                                                             TREATMENT DATE: 04/15/24  Gait training/ TA:   STS x 10 reps no UE assist  Weighted Gait without AD x 415ft with 3# Aws cues for foot clearance. With fatigue on the LUE.  STS x 10 reps  no UE assist  Weighted Gait without AD x 417ft with 3# Aws cues for foot clearance. With fatigue on the LUE.  STS x 10 reps no UE assist  Weighted Gait without AD x 424ft with 3# Aws cues for foot clearance. With fatigue on the LUE.   NMR  Activity Description: blaze pods in 1/2 arc in front of resistance cable machine, pt hooked up  to 12.5# resistance and has to side step with resistance mostly post around arc to tap pods Activity Setting:  round 1 sequence, round 2 random  Number of Pods:  4 Cycles/Sets:  2 Duration (Time or Hit Count):  2 min  1/2 romberg (wide) on airex 2 x 1 min ea side  Airex step taps 2 x 10 ea LE to 6 in step   TA- To improve functional movements patterns for everyday tasks   Stair ascent forward/descent reverse with step through pattern to improve muscle activation and motor plan variation 4steps x 5 bouts  Same but completes laterally on steps x 3 rounds ea side      PATIENT EDUCATION: Education details: Pt has made remarkable toward ADL based goals, however a massive gap remains between activities he has done here in clinic and what he will need to do to prepare for return to work duties.  Person educated: Patient Education method: Explanation, Demonstration, and Handouts Education comprehension: verbalized understanding and returned demonstration  HOME EXERCISE PROGRAM: Access Code: [email or web address]  URL: [email or web address]  Date: 02/17/2024  Prepared by: Darryle Patten  Exercises: - Seated March  - 1 x daily - 3 x weekly - 3 sets - 10 reps  - Standing March with Counter Support  - 1 x daily - 3 x weekly - 3 sets - 10 reps  - Mini Squat with Counter Support  - 1 x daily - 3 x weekly - 3 sets - 10 reps  - Heel Toe Raises with Counter Support  - 1 x daily - 3 x weekly - 3 sets - 10 reps   GOALS: Goals reviewed with patient? Yes  SHORT TERM GOALS: Target date: 03/09/2024  Patient will be independent in home exercise program to improve strength/mobility for better functional independence with ADL. Baseline: HEP reviewed during eval Goal status: ACHIEVED  LONG TERM GOALS: Target date: 05/11/2024  Patient will increase their Stroke Impact Scale score by >10 points for improved function and independence Baseline: 45/80 8/13: 70/80 Goal status: MET   2.  Patient (< 72  years old) will complete five times sit to stand test in < 10 seconds indicating an increased LE strength and improved balance. Baseline: 17.9 seconds with UE support; 03/29/24: 11.7 hands free Goal status: progress made, goal not met   3.  Patient will increase 10 meter walk test to >0.8 m/s as to improve gait speed for better community ambulation and to reduce fall risk. Baseline: 0.56 m/s with 5TT & 0.73 m/s with no AD ; 8/12: 1.59m/s  Goal status: ACHIEVED  4.  Patient will increase Berg Balance score by > 6 points to demonstrate decreased fall risk during functional activities. Baseline: 33/56; 03/30/24: 54/56 Goal status: ACHIEVED,    5.  Patient will increase Functional Gait Assessment score to >20/30 as to reduce fall risk and improve dynamic gait safety with community ambulation. Baseline:18/30 on 02/25/24; 03/30/24: 27/30 Goal status: ACHIEVED  6.  Patient will increase six minute walk test distance to > 1562ft ft for progression to limited community Ambulator and improved  independence with access to community and occupation  Baseline: 785ft with 4WW on 02/25/24; 03/11/24: 997ft c SPC(MET >66ft improvement)  Goal status: MET: adjusted. NEW.   7. Pt will increase SLS to > 20 sec bil to improve safety with ADLs and IADLs including showering, shopping, and work related tasks.   Baseline: 8/13: 4 sec on the R, 3 sec on the L   Goal status NEW.   ASSESSMENT:  CLINICAL IMPRESSION:   Patient continues to focus on functional strengthening as well as dynamic balance activities this date.  Ensured to complete exercises without putting any significant strain on the left hand as patient is still having some discomfort from lifting boxes in session last week but it is improving per his report.  Patient reports he felt challenged with today's activities generally.  Pt will benefit from continued therapy in order to address his impairments, and improve his ability to return to work-related activities,  and to improve his overall quality of life.  OBJECTIVE IMPAIRMENTS: Abnormal gait, decreased activity tolerance, decreased balance, decreased coordination, decreased endurance, decreased mobility, difficulty walking, decreased ROM, decreased strength, hypomobility, impaired perceived functional ability, impaired flexibility, impaired UE functional use, improper body mechanics, and postural dysfunction.   ACTIVITY LIMITATIONS: carrying, lifting, bending, sitting, standing, squatting, stairs, transfers, bathing, reach over head, hygiene/grooming, and locomotion level  PARTICIPATION LIMITATIONS: meal prep, cleaning, laundry, driving, shopping, community activity, occupation, and yard work  PERSONAL FACTORS: Age and Fitness are also affecting patient's functional outcome.   REHAB POTENTIAL: Good  CLINICAL DECISION MAKING: Evolving/moderate complexity  EVALUATION COMPLEXITY: Moderate  PLAN:  PT FREQUENCY: 2x/week  PT DURATION: 12 weeks  PLANNED INTERVENTIONS: 97164- PT Re-evaluation, 97750- Physical Performance Testing, 97110-Therapeutic exercises, 97530- Therapeutic activity, [id number]- Neuromuscular re-education, 97535- Self Care, [id number]- Manual therapy, [id number]- Gait training, [phone]- Orthotic Initial, [phone]- Orthotic/Prosthetic subsequent, [phone]- Canalith repositioning, [id number]- Splinting, [phone]- Electrical stimulation (manual), [id number]- Ultrasound, [id number]- Traction (mechanical), [phone] (1-2 muscles), [id number] (3+ muscles)- Dry Needling, Patient/Family education, Balance training, Stair training, Taping, Joint mobilization, Spinal mobilization, Vestibular training, Visual/preceptual remediation/compensation, Cognitive remediation, DME instructions, Cryotherapy, and Moist heat  PLAN FOR NEXT SESSION:   - continue working on job related duties to improve stamina and lifting+mobility/balance abilities - SLS    Lonni KATHEE Gainer PT ,DPT Physical Therapist- [place]  [facility]    10:58 AM 04/15/24

## 2024-04-15 NOTE — Therapy (Signed)
 OUTPATIENT SPEECH LANGUAGE PATHOLOGY  SWALLOW and DYSARTHRIA TREATMENT NOTE   Patient Name: Mark Mcintosh MRN: [id number] DOB:1971-04-24, 53 y.o., male Today's Date: 04/15/2024  PCP: Curtis Boom, FNP REFERRING PROVIDER: Fidela Ned, NP   End of Session - 04/15/24 1135     Visit Number 9    Number of Visits 17    Date for SLP Re-Evaluation 04/27/24    Authorization Type [employer]    Progress Note Due on Visit 10    SLP Start Time 1145    SLP Stop Time  1220    SLP Time Calculation (min) 35 min    Activity Tolerance Patient tolerated treatment well          Past Medical History:  Diagnosis Date   Arthritis    Hypertension    Stroke (HCC) 01/09/2024   Stated he had 2 strokes.   Past Surgical History:  Procedure Laterality Date   BREAST BIOPSY Right 03/15/2024   US  RT BREAST BX W LOC DEV 1ST LESION IMG BX SPEC US  GUIDE 03/15/2024 [facility]   TEE WITHOUT CARDIOVERSION N/A 01/14/2024   Procedure: ECHOCARDIOGRAM, TRANSESOPHAGEAL;  Surgeon: Alluri, Keller BROCKS, MD;  Location: [facility] ORS;  Service: Cardiovascular;  Laterality: N/A;   Patient Active Problem List   Diagnosis Date Noted   Hemiparesis affecting left side as late effect of cerebrovascular accident (HCC) 04/01/2024   Mass of lower outer quadrant of right breast 02/27/2024   History of cerebrovascular accident (CVA) with residual deficit 02/27/2024   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) 02/27/2024   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) 02/27/2024   Gait abnormality 02/27/2024   Left-sided weakness 02/27/2024   Prediabetes 02/27/2024   Abnormal pulse oximetry 02/27/2024   Vitamin D  deficiency 02/27/2024   Bradycardia 02/27/2024   Depression, major, single episode, moderate (HCC) 02/27/2024   Coping style affecting medical condition 01/20/2024   Cerebrovascular accident (CVA) (HCC) 01/14/2024   Acute CVA (cerebrovascular accident)  (HCC) 01/09/2024   Tobacco use disorder 01/09/2024   Obesity (BMI 30-39.9) 01/09/2024   Hypertension     ONSET DATE: 01/09/2024   REFERRING DIAG:  R47.1 (ICD-10-CM) - Dysarthria  R13.10 (ICD-10-CM) - Dysphagia    THERAPY DIAG:  Dysarthria and anarthria  Rationale for Evaluation and Treatment Rehabilitation  SUBJECTIVE:   PERTINENT HISTORY and DIAGNOSTIC FINDINGS: Pt is a right handed 53 year old male with past medical history of hypertension and tobacco use disorder. He presented to [facility] ED on 01/09/2024 with left sided weakness. MRI on 01/10/2024 revealed 1. Acute infarcts in the posterior limb of the right internal  capsule, the overlying right frontal white matter, and pons. Given  involvement of multiple vascular territories, consider an embolic  etiology.  2. Multiple remote infarcts and chronic microvascular ischemic  disease. Pt received ST services at [facility] from 01/16/2024 thru 01/29/2024.   PAIN:  Are you having pain? No  FALLS: Has patient fallen in last 6 months?  No  LIVING ENVIRONMENT: Lives with: lives alone Lives in: House/apartment  PLOF:  Level of assistance: Independent with ADLs, Independent with IADLs Employment: Full-time employment  SUBJECTIVE STATEMENT: Pt pleasant, reports continued improvement/progress Pt accompanied by: self  PATIENT GOALS  to improve speech and swallow abilities  OBJECTIVE:   TODAY'S TREATMENT NOTE: Skilled treatment session targeted pt's dysarthria and dysphagia goals. SLP facilitated session by providing the following interventions:  Pt with great speech intelligibility when communicating directions at the conversation level (>98%)  with Mod I use of strategies. Pt with one instance of decreased speech intelligibility that prevented listener from understanding.    PATIENT EDUCATION: Education details: see above Person educated: Patient Education method: Explanation and written cues Education comprehension: verbalized  understanding and needs further education  HOME EXERCISE PROGRAM:     Aspiration Precautions  Compensatory Swallow strategies  Complete EMST   GOALS: Goals reviewed with patient? Yes  SHORT TERM GOALS: Target date: 10 sessions  With Mod I, pt will keep food and liquid in mouth while eating without losing the bolus out of the front of the mouth to safely consume least restrictive diet. Baseline: Goal status: INITIAL  2.  With Mod I, pt will complete lingual strengthening exercises (anterior and posterior) x 15 times in order to increase muscle strength to reduce oral residue given regular textures.  Baseline:  Goal status: INITIAL  3.  With Mod I, patient will complete 3 sets of 10 repetitions with EMST set at 100cmH2O with self-reported effortful of 7 out of 10. Baseline:  Goal status: INITIAL  4.  With Mod I, patient will use dysarthria compensations (slow, loud, overpronounce, pause) in 2-3 sentence responses with 95% accuracy. Baseline:  Goal status: INITIAL   LONG TERM GOALS: Target date: 04/27/2024  With Mod I, pt will utilize safe swallwoing strategies in order to reduce risk of aspiration, dehydration and weight loss with consuming regular textures. Baseline:  Goal status: INITIAL  2.  With Mod I, pt will demonstrate the ability to adequately self-monitor swallowing skills and perform appropriate compensatory techniques to reduce s/s of aspiration and to safely consume least restrictive diet.     Baseline:  Goal status: INITIAL  3.  With Mod I, patient will improve speech intelligibility for  paragraph by controlling rate of speech, over-articulation, and increased loudness to achieve 95% intelligibility.  Baseline:  Goal status: INITIAL   ASSESSMENT:  CLINICAL IMPRESSION: Patient is a 53 year old right handed male who was seen today for an dysarthria and dysphagia treatment d/t recent right internal capsule and pons CVA.    Pt presents with mild to moderate  flaccid dysarthria d/t left side facial and lingual weakness that in reduced lip seal (during production of plosives) and imprecise lingual articulation. Pt's vocal quality was perceptionally wetter the longer that he talked. He reports that I start slobbering on the left side when I talk a lot.  Pt presents with mild oropharyngeal dysphagia that is c/b reduced lingual manipulation of boluses and left labial spillage of liquids. Pt reports my swallowing is decent but when I drink water it comes out on the left side. He reports drinking from a large insulated cup rim.   Pt eager and good response to therapy; improved speech intelligibility as a result. See the above treatment note for details.    OBJECTIVE IMPAIRMENTS include dysarthria and dysphagia. These impairments are limiting patient from return to work, effectively communicating at home and in community, and safety when swallowing. Factors affecting potential to achieve goals and functional outcome are Multiple remote infarcts and chronic microvascular ischemic. Patient will benefit from skilled SLP services to address above impairments and improve overall function.  REHAB POTENTIAL: Good  PLAN: SLP FREQUENCY: 1-2x/week  SLP DURATION: 8 weeks  PLANNED INTERVENTIONS: Aspiration precaution training, Pharyngeal strengthening exercises, Diet toleration management , Trials of upgraded texture/liquids, Oral motor exercises, SLP instruction and feedback, Compensatory strategies, and Patient/family education    Happi B. Rubbie, M.S., CCC-SLP, CBIS Speech-Language Pathologist Certified  Brain Injury Specialist [facility]  [facility] Services Office [phone] Ascom [phone] Fax [phone]

## 2024-04-16 ENCOUNTER — Ambulatory Visit
Admission: RE | Admit: 2024-04-16 | Discharge: 2024-04-16 | Disposition: A | Discharge status: Home / Self Care | Attending: Family Medicine | Admitting: Family Medicine

## 2024-04-16 ENCOUNTER — Ambulatory Visit: Payer: Self-pay | Admitting: Family Medicine

## 2024-04-16 ENCOUNTER — Ambulatory Visit: Admitting: Family Medicine

## 2024-04-16 ENCOUNTER — Ambulatory Visit
Admission: RE | Admit: 2024-04-16 | Discharge: 2024-04-16 | Disposition: A | Discharge status: Home / Self Care | Source: Ambulatory Visit | Attending: Family Medicine | Admitting: Family Medicine

## 2024-04-16 ENCOUNTER — Encounter: Payer: Self-pay | Admitting: Family Medicine

## 2024-04-16 VITALS — BP 115/82 | Temp 94.0°F | Ht 71.0 in | Wt 225.2 lb

## 2024-04-16 DIAGNOSIS — Z23 Encounter for immunization: Secondary | ICD-10-CM

## 2024-04-16 DIAGNOSIS — I69392 Facial weakness following cerebral infarction: Secondary | ICD-10-CM

## 2024-04-16 DIAGNOSIS — I69322 Dysarthria following cerebral infarction: Secondary | ICD-10-CM

## 2024-04-16 DIAGNOSIS — R7303 Prediabetes: Secondary | ICD-10-CM | POA: Diagnosis not present

## 2024-04-16 DIAGNOSIS — Z122 Encounter for screening for malignant neoplasm of respiratory organs: Secondary | ICD-10-CM

## 2024-04-16 DIAGNOSIS — Z1211 Encounter for screening for malignant neoplasm of colon: Secondary | ICD-10-CM

## 2024-04-16 DIAGNOSIS — E669 Obesity, unspecified: Secondary | ICD-10-CM

## 2024-04-16 DIAGNOSIS — F17200 Nicotine dependence, unspecified, uncomplicated: Secondary | ICD-10-CM

## 2024-04-16 DIAGNOSIS — F321 Major depressive disorder, single episode, moderate: Secondary | ICD-10-CM

## 2024-04-16 DIAGNOSIS — M7989 Other specified soft tissue disorders: Secondary | ICD-10-CM

## 2024-04-16 DIAGNOSIS — I10 Essential (primary) hypertension: Secondary | ICD-10-CM

## 2024-04-16 DIAGNOSIS — R531 Weakness: Secondary | ICD-10-CM

## 2024-04-16 DIAGNOSIS — I6930 Unspecified sequelae of cerebral infarction: Secondary | ICD-10-CM

## 2024-04-16 LAB — POCT GLYCOSYLATED HEMOGLOBIN (HGB A1C): Hemoglobin A1C: 5.9 % — AB (ref 4.0–5.6)

## 2024-04-16 MED ORDER — METFORMIN HCL 850 MG PO TABS: 850.0000 mg | ORAL_TABLET | Freq: Every day | ORAL | 1 refills | Status: DC

## 2024-04-16 NOTE — Progress Notes (Signed)
 Established Patient Office Visit  Introduced to nurse practitioner role and practice setting.  All questions answered.  Discussed provider/patient relationship and expectations.  Subjective   Patient ID: Mark Mcintosh, male    DOB: 1970/08/21  Age: 53 y.o. MRN: [id number]  Chief Complaint  Patient presents with   Medical Management of Chronic Issues    Patient is present for 7 week follow-up and labs. He was last seen 02/27/24. Patient reports swelling in his left hand since last week after rehab.   Colonoscopy - ok to place referral Lung Cancer - ok to place referral   Hyperlipidemia   Hypertension   Immunizations    Influenza - ok to order Pneumococcal - ok to order Zoster - not today Tetanus - not today    Discussed the use of AI scribe software for clinical note transcription with the patient, who gave verbal consent to proceed.  History of Present Illness Mark Mcintosh is a 53 year old male with a history of stroke who presents for follow-up and some concerns for L hand swelling.SABRA  He has experienced swelling in his hand since last week after lifting a heavy box during rehabilitation. He felt a 'pop' in his hand at the time, followed by swelling. The swelling has persisted but has improved slightly with rest. States he did not tell his PT/OT when it happened There is no pain associated with the swelling, but his fingers feel tight. His left side is weaker at baseline from stroke in May 2025.  He is also here for a follow-up on his blood glucose levels. His last A1c was 5.7%, indicating prediabetes, and it has now increased to 5.9% with POC. He is interested in lifestyle management to control his blood sugar levels. He has been trying to improve his diet by eating more vegetables and less bread.  He has a history of stroke, which has left his left side weaker. He is currently undergoing rehabilitation and is concerned about returning to work too soon, as his job  requires heavy lifting. His speech is improving with therapy, and he is working on strengthening his left side.  He is currently taking amlodipine  and irbesartan  for blood pressure management. His blood pressure has been well-controlled.      03/26/2024   11:11 AM 02/27/2024    9:11 AM 02/11/2024    8:28 AM  Depression screen PHQ 2/9  Decreased Interest 0 1 0  Down, Depressed, Hopeless 0 1 0  PHQ - 2 Score 0 2 0  Altered sleeping 1 1   Tired, decreased energy 0 2   Change in appetite 0 1   Feeling bad or failure about yourself  0 1   Trouble concentrating 1 2   Moving slowly or fidgety/restless 0 1   Suicidal thoughts 0 0   PHQ-9 Score 2 10   Difficult doing work/chores Somewhat difficult Not difficult at all        02/27/2024    9:11 AM  GAD 7 : Generalized Anxiety Score  Nervous, Anxious, on Edge 1  Control/stop worrying 1  Worry too much - different things 2  Trouble relaxing 1  Restless 0  Easily annoyed or irritable 1  Afraid - awful might happen 2  Total GAD 7 Score 8  Anxiety Difficulty Somewhat difficult   ROS  Negative unless indicated in HPI   Objective:     BP 115/82 (BP Location: Right Arm, Patient Position: Sitting)   Temp (!) 94 F (  34.4 C)   Ht 5' 11 (1.803 m)   Wt 225 lb 3.2 oz (102.2 kg)   BMI 31.41 kg/m    Physical Exam Constitutional:      General: He is not in acute distress.    Appearance: Normal appearance. He is not ill-appearing, toxic-appearing or diaphoretic.  HENT:     Head: Normocephalic.     Nose: Nose normal.     Mouth/Throat:     Mouth: Mucous membranes are moist.  Eyes:     General: No visual field deficit.    Extraocular Movements: Extraocular movements intact.     Conjunctiva/sclera: Conjunctivae normal.     Pupils: Pupils are equal, round, and reactive to light.  Cardiovascular:     Rate and Rhythm: Normal rate and regular rhythm.     Pulses: Normal pulses.     Heart sounds: Normal heart sounds. No murmur heard.     No friction rub. No gallop.  Pulmonary:     Effort: Pulmonary effort is normal. No respiratory distress.     Breath sounds: Normal breath sounds. No stridor. No wheezing, rhonchi or rales.  Chest:     Chest wall: No tenderness.  Musculoskeletal:     Right lower leg: No edema.     Left lower leg: No edema.  Skin:    General: Skin is warm and dry.     Capillary Refill: Capillary refill takes less than 2 seconds.  Neurological:     General: No focal deficit present.     Mental Status: He is alert and oriented to person, place, and time. Mental status is at baseline.     GCS: GCS eye subscore is 4. GCS verbal subscore is 5. GCS motor subscore is 6.     Cranial Nerves: Cranial nerve deficit, dysarthria and facial asymmetry present.     Sensory: No sensory deficit.     Motor: Weakness present. No seizure activity.     Coordination: Coordination normal.     Gait: Gait abnormal.  Psychiatric:        Mood and Affect: Mood normal.        Behavior: Behavior normal.        Thought Content: Thought content normal.        Judgment: Judgment normal.    Results for orders placed or performed in visit on 04/16/24  POCT HgB A1C  Result Value Ref Range   Hemoglobin A1C 5.9 (A) 4.0 - 5.6 %   HbA1c POC (<> result, manual entry)     HbA1c, POC (prediabetic range)     HbA1c, POC (controlled diabetic range)        The ASCVD Risk score (Arnett DK, et al., 2019) failed to calculate for the following reasons:   Risk score cannot be calculated because patient has a medical history suggesting prior/existing ASCVD    Assessment & Plan:  Prediabetes -     POCT glycosylated hemoglobin (Hb A1C) -     metFORMIN  HCl; Take 1 tablet (850 mg total) by mouth daily with breakfast.  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)  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)  Obesity (BMI 30-39.9) -     Amb ref to Medical Nutrition Therapy-MNT  Left-sided weakness  Primary hypertension  Depression, major, single episode, moderate (HCC)  History of cerebrovascular accident (CVA) with residual deficit  Colon cancer screening -     Ambulatory referral to Gastroenterology  Tobacco use disorder -     CT CHEST LUNG CANCER SCREENING LOW DOSE WO CONTRAST; Future  Screening for lung cancer -     CT CHEST LUNG CANCER SCREENING LOW DOSE WO CONTRAST; Future     Assessment and Plan Assessment & Plan Swelling of left hand Swelling persists after heavy lifting during rehab, with no pain but a pop felt during lifting. Possible soft tissue injury or inflammation. L side baseline weaker due to stroke. - Order hand x-ray to rule out fracture or other injury - Advise elevation of the hand - Recommend alternating heat and ice application - Suggest Tylenol  every 4-6 hours as needed for discomfort - Communicate with PT/OT regarding incident, do not over exert self during rehab  Prediabetes A1c increased to 5.9 from 5.7, indicating prediabetes. Discussed lifestyle modifications and potential metformin  use. He is motivated to prevent diabetes progression. - Prescribe metformin  850mg  daily for DMII prevention - Refer to a nutritionist for dietary counseling - Advise on lifestyle modifications including diet and exercise - Plan to recheck A1c in three months  Essential hypertension Blood pressure is well-controlled with current medications.  - Continue current medications: irbesartan  75mg  daily and amlodipine  2.5mg  daily - Advise home blood pressure monitoring weekly - GOAL<130/80 - Limit sodium intake - Consider reducing amlodipine  if blood pressure remains stable at  next visit  History of stroke with left-sided hemiparesis Continues to experience left-sided weakness, dysarthia, left facial droop, swallowing improved.  - Strength improving ambulating with cane, continue with gait difficulties - Pt is very motivated to be the best he can be post stroke - Continues to be limited to return to work duties given the scope he must perform at work, recommend continue rehab until November with the hope to return to work then. Given job's physical demands and pt's current physical disabilities post stroke - much improving from initial injury. Engaged in rehabilitation. Physical limitations persist, delaying return to work. Neurology follow-up scheduled for October 02, 2024. - Continue rehabilitation therapy with PT/OT/ speech - Advise follow-up with neurology in February 2026 - Discuss delaying return to work until physical strength improves - Filled out pt's [employer] paperwork for work needs and FMLA upkeep - to be faxed.  Depression - Much improved since previous visit - Decline medication assistance at this time - Continues to have support and CBT through stroke recovery efforts.  Obesity, BMI = 31.41 Continue to make conscious decisions for well balanced diet smaller portions with increase protein, fruits, veggies, water as drink of choice, decrease starches, processed foods, and saturated fats. Weekly purposeful exercise as tolerated with stroke rehab- recommend 10 minutes of walking per day.   Tobacco Use Disorder - Recommend cessation - pt aware and is attempting to decrease - lung CA screening CT ordered  Colon CA Screening - Referral to GI for colonoscopy placed.   Immunization Due - Flu vaccine and Pneumococcal vaccines - consent obtained - Immunizations given  Return in about 3 months (around 07/17/2024) for BP check  and Labs (lipid, a1c, BMP.   I, Curtis DELENA Boom, FNP, have reviewed all documentation for this visit. The  documentation on 04/18/24 for the exam, diagnosis, procedures, and orders  are all accurate and complete.  Curtis DELENA Boom, FNP

## 2024-04-17 NOTE — Therapy (Signed)
 OUTPATIENT OCCUPATIONAL THERAPY NEURO TREATMENT NOTE  Patient Name: Mark Mcintosh MRN: [id number] DOB:February 07, 1971, 53 y.o., male Today's Date: 04/17/2024  PCP: No PCP REFERRING PROVIDER: [name], PA-C  END OF SESSION:   OT End of Session - 04/17/24 1054     Visit Number 13    Number of Visits 24    Date for OT Re-Evaluation 05/11/24    OT Start Time 1016    OT Stop Time 1100    OT Time Calculation (min) 44 min    Activity Tolerance Patient tolerated treatment well    Behavior During Therapy [facility] for tasks assessed/performed             Past Medical History:  Diagnosis Date   Arthritis    Hypertension    Stroke (HCC) 01/09/2024   Stated he had 2 strokes.   Past Surgical History:  Procedure Laterality Date   BREAST BIOPSY Right 03/15/2024   US  RT BREAST BX W LOC DEV 1ST LESION IMG BX SPEC US  GUIDE 03/15/2024 [facility]   TEE WITHOUT CARDIOVERSION N/A 01/14/2024   Procedure: ECHOCARDIOGRAM, TRANSESOPHAGEAL;  Surgeon: Alluri, Keller BROCKS, MD;  Location: [facility] ORS;  Service: Cardiovascular;  Laterality: N/A;   Patient Active Problem List   Diagnosis Date Noted   Hemiparesis affecting left side as late effect of cerebrovascular accident (HCC) 04/01/2024   Mass of lower outer quadrant of right breast 02/27/2024   History of cerebrovascular accident (CVA) with residual deficit 02/27/2024   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) 02/27/2024   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) 02/27/2024   Gait abnormality 02/27/2024   Left-sided weakness 02/27/2024   Prediabetes 02/27/2024   Abnormal pulse oximetry 02/27/2024   Vitamin D  deficiency 02/27/2024   Bradycardia 02/27/2024   Depression, major, single episode, moderate (HCC) 02/27/2024   Coping style affecting medical condition 01/20/2024   Cerebrovascular accident (CVA) (HCC) 01/14/2024   Acute CVA (cerebrovascular accident) (HCC) 01/09/2024   Tobacco use  disorder 01/09/2024   Obesity (BMI 30-39.9) 01/09/2024   Hypertension    ONSET DATE: 01/09/24  REFERRING DIAG: I63.9 (ICD-10-CM) - Acute CVA (cerebrovascular accident) (HCC)   THERAPY DIAG:  Muscle weakness (generalized)  Other lack of coordination  Acute CVA (cerebrovascular accident) (HCC)  Rationale for Evaluation and Treatment: Rehabilitation  SUBJECTIVE:  SUBJECTIVE STATEMENT: Pt reports looking forward to getting together with some extended family members over the weekend. Pt accompanied by: self  PERTINENT HISTORY: Per chart:  Mark Mcintosh is a 53 y.o. right-handed male with history significant for bilateral conjunctival inflammation hypertension as well as tobacco use and class II morbid obesity with BMI 36.26.  Per chart review patient lives alone independent prior to admission.  [facility]ed [facility] with acute onset of left-sided weakness.  MRI showed acute infarct in the posterior limb of the right internal capsule, the overlying right frontal white matter and pons.  Multiple remote infarcts and chronic microvascular ischemic disease.  CTA showed no large vessel occlusion.  Admission chemistries unremarkable except potassium 3.1.  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  TEE with small PFO per cardiology services with no plan for closure.  Neurology follow-up placed on aspirin  and Plavix  for CVA prophylaxis x 3 weeks then aspirin  alone.  Lovenox  for DVT prophylaxis but bilateral Doppler studies negative.  Therapy evaluations completed due to patient decreased functional mobility left-sided weakness was admitted for a comprehensive rehab program.   PRECAUTIONS: Fall  WEIGHT BEARING RESTRICTIONS: No  PAIN: No pain, just numbness in the hand  Are you having pain? 0/10  FALLS: Has patient fallen in last 6 months? Yes. 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  Get back my independence.    OBJECTIVE:  Note: Objective measures were completed at Evaluation unless otherwise noted.  HAND DOMINANCE: Right  ADLs: Overall ADLs: Son was staying with pt for about a week after d/c from inpatient rehab, but has since returned home.  Friend comes by every other day to check in.   Transfers/ambulation related to ADLs: modified indep with rollator Eating: increased difficulty with cutting food  Grooming: increased difficulty with clipping nails, otherwise manages fine with dominant/unaffected hand UB Dressing: increased time with clothing fasteners LB Dressing: increased time with clothing fasteners  Toileting: increased time to engage L hand into clothing management Bathing: pt sits on shower chair to wash LEs, some SOB with standing in shower Tub Shower transfers: Modified indep Equipment: Shower seat without back  IADLs: Shopping: motorized cart, can push cart for shorter shopping trips.  Light housekeeping: extra time and cautions to avoid falls, sometimes feels SOB with laundry  Meal Prep: friend helps with stove top cooking, pt can do light hot and cold meal prep  Community mobility: rollator, friend drove pt  Medication management: indep  Landscape architect: indep  Handwriting: NT; L non-dominant hand affected   MOBILITY STATUS: Hx of falls, rollator or funiture/wall walking in the home, rollator for community  POSTURE COMMENTS:  L sided hemiparesis   ACTIVITY TOLERANCE: Activity tolerance: Pt reports some dyspnea with prolonged standing/mobility  UPPER EXTREMITY ROM:  BUEs WFL  UPPER EXTREMITY MMT:     MMT Right eval Left eval Left 03/31/24  Shoulder  flexion 5 4- 4-  Shoulder abduction 5 4- 4-  Shoulder adduction     Shoulder extension     Shoulder internal rotation   4-  Shoulder external rotation   3+  Middle trapezius     Lower trapezius     Elbow flexion 5 4- 4+  Elbow extension 5 4- 4+  Wrist flexion 5 4- 4+  Wrist extension 5 4- 4  Wrist ulnar deviation     Wrist radial deviation     Wrist pronation 5 4- 4+  Wrist supination 5 4- 4+  (Blank rows = not tested)  HAND FUNCTION: Eval: Grip strength: Right: 111 lbs; Left: 51 lbs, Lateral pinch: Right: 23 lbs, Left: 13 lbs, and 3 point pinch: Right: 26 lbs, Left: 13 lbs 03/31/24: Grip strength: Right: 101 lbs, Left: 31 lbs, lateral pinch: Right: 23 lbs, Left: 13 lbs, 3 point pinch: Right: 25 lbs, Left: 14 lbs  04/06/24: Grip strength: Left: 32 lbs 04/13/24: Grip strength: Left: 45 lbs    COORDINATION: Finger Nose Finger test: increased time and decreased accuracy on the L  9 Hole Peg test: Right: 25 sec; Left: 1 min 27 sec 03/31/24: L 52 sec, 1 min 4 sec, 39 sec   SENSATION: Pt reports increased tingling/numbness when hand balls up a little, but when he straightens  it out it goes back to normal.   EDEMA: no visible edema   MUSCLE TONE: LUE: Mild   COGNITION: Overall cognitive status: Within functional limits for tasks assessed pt reportme difficulties with memory   VISION:             Subjective report: Wears glasses all the time.  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                                                                                                              TREATMENT DATE: 04/15/24 Therapeutic Activity: -Facilitated LUE GMC/FMC working to place and remove therapy resistant clothespins (all colors) on/off vertical and horizontal dowels.  Pt practiced quick/alternating reps of pron/sup to move pins on/off a horizontal dowel, and forward and lateral reaching patterns to move clips on/off a vertical dowel, performing 2 sets with a lateral pinch position, and 2 sets with a 3 point pinch position; min vc to formulate pinch patterns.  -Facilitated L shoulder strengthening/GMC/FMC: 1# wrist weight donned to L wrist.  Pt practiced forward and lateral reaching patterns to move magnets from top>bottom>L side of white board using LUE.    Therapeutic Exercise: -L grip strengthening: Hand gripper set at 11.2# to remove jumbo pegs from pegboard.  Increased challenge with pegboard placed on an incline wedge for 2 trials, and downgraded to table top/no incline for 3rd set.  Neuro re-ed: -Facilitated L hand FMC/dexterity skills working to reposition (180* rotations) small objects within fingertips; min vc to avoid stabilizing item on body or table top in order to challenge hand coordination: clothespin and pencil  PATIENT EDUCATION: Education details: LUE GMC/FMC activities  Person educated: Patient Education method: Explanation and Verbal cues Education comprehension: verbalized understanding  HOME EXERCISE PROGRAM: Green theraputty 2-3x per day for 5-10 min periods for L hand grip strengthening, FMC handout  GOALS: Goals reviewed with patient? Yes  SHORT TERM GOALS: Target date: 03/30/24  Pt will be indep to perform HEP for increasing strength and coordination throughout the LUE. Baseline: Eval: Not yet initiated; 03/31/24: Indep with HEP and verbalizes understanding of progressions made today for frequency Goal status: achieved  LONG TERM GOALS: Target date: 05/11/24  Pt will increase LUE MMT by 1/2 grade or more  to increase engagement of LUE into ADL/IADLs. Baseline: Eval: LUE grossly 4-/5 throughout (R 5/5); 03/31/24: L shoulder grossly 4-, elbow flex 4+, ext 4, forearm pron/sup 4+ Goal status: in progress  2.  Pt will increase L grip strength by 20 lbs or more to enable pt to grasp and carry heavy ADL supplies in L hand. Baseline: Eval: L 51 lbs (R 111 lbs); 03/31/24: L grip  31 lbs (R 101 lbs) Goal status: in progress  3.  Pt will increase L hand [facility] skills as demonstrated by completion of 9 hole peg test in <1 min to improve manipulation of small ADL supplies. Baseline: Eval: L 1 min 27 sec (R 25 sec); 03/31/24: 3 trials completed today: L 52 sec, 1 min 4 sec, 39 sec  Goal status: in progress  4.  Pt will increase LUE GMC to enable confidence with moving hot pots/pans on/off stove top and in/out of the oven using BUEs. Baseline: Eval: Pt reports that his friend currently assists with heavier meal prep; 03/31/24: Pt is now cooking independently, but with increased time/caution Goal status: in progress  ASSESSMENT:  CLINICAL IMPRESSION: Pt continues to focus on L grip strengthening at home with regular use of putty several times per day.  Pt is improving with FMC/GMC skills in the LUE, though tends to drop smaller objects when working to reposition them within thumb and fingertips, often using his body or table top for stabilization.  OT reviewed FMC/dexterity activities to practice object repositioning at home, with pt demonstrating and verbalizing understanding.  Pt will continue to benefit from skilled OT to work towards above noted goals for improving indep with daily tasks, return to work, reduce burden of care on caregivers, and improve QOL.    PERFORMANCE DEFICITS: in functional skills including ADLs, IADLs, coordination, dexterity, sensation, tone, strength, Fine motor control, Gross motor control, mobility, balance, body mechanics, endurance, decreased knowledge of precautions, decreased knowledge  of use of DME, vision, and UE functional use, cognitive skills including memory, and psychosocial skills including coping strategies, environmental adaptation, and routines and behaviors.   IMPAIRMENTS: are limiting patient from ADLs, IADLs, work, and leisure.   CO-MORBIDITIES: has co-morbidities such as tobacco use disorder, HTN, arthritis that affects occupational performance. Patient will benefit from skilled OT to address above impairments and improve overall function.  MODIFICATION OR ASSISTANCE TO COMPLETE EVALUATION: No modification of tasks or assist necessary to complete an evaluation.  OT OCCUPATIONAL PROFILE AND HISTORY: Detailed assessment: Review of records and additional review of physical, cognitive, psychosocial history related to current functional performance.  CLINICAL DECISION MAKING: Moderate - several treatment options, min-mod task modification necessary  REHAB POTENTIAL: Good  EVALUATION COMPLEXITY: Moderate  PLAN:  OT FREQUENCY: 2x/week  OT DURATION: 12 weeks  PLANNED INTERVENTIONS: 97168 OT Re-evaluation, 97535 self care/ADL training, 02889 therapeutic exercise, 97530 therapeutic activity, 97112 neuromuscular re-education, 97140 manual therapy, 97116 gait training, 02989 moist heat, 97010 cryotherapy, 97750 Physical Performance Testing, passive range of motion, balance training, functional mobility training, visual/perceptual remediation/compensation, psychosocial skills training, energy conservation, coping strategies training, patient/family education, and DME and/or AE instructions  RECOMMENDED OTHER SERVICES: SLP eval and treat d/t pt reporting memory deficits since CVA, drooling, and slurred speech  CONSULTED AND AGREED WITH PLAN OF CARE: Patient  PLAN FOR NEXT SESSION: see above   Inocente Blazing, MS, OTR/L   04/17/2024, 10:55 AM

## 2024-04-18 ENCOUNTER — Encounter: Payer: Self-pay | Admitting: Family Medicine

## 2024-04-20 ENCOUNTER — Ambulatory Visit: Admitting: Speech Pathology

## 2024-04-20 ENCOUNTER — Ambulatory Visit: Payer: Self-pay

## 2024-04-20 ENCOUNTER — Ambulatory Visit: Admitting: Physical Therapy

## 2024-04-20 NOTE — Telephone Encounter (Signed)
 Recommend pt seek care through urgent care, [facility] for higher level assessment.

## 2024-04-20 NOTE — Telephone Encounter (Signed)
 FYI Only or Action Required?: FYI only for provider.  Patient was last seen in primary care on 04/16/2024 by Wellington Curtis LABOR, FNP.  Called Nurse Triage reporting Toe Pain.  Symptoms began Sat.  Interventions attempted: OTC medications: Tylenol .  Symptoms are: gradually worsening.  Triage Disposition: See PCP When Office is Open (Within 3 Days)  Patient/caregiver understands and will follow disposition?: yes        Copied from CRM #[id number]. Topic: Clinical - Red Word Triage >> Apr 20, 2024  8:08 AM Mia F wrote: Red Word that prompted transfer to Nurse Triage: Pt fell and hit his toe. There is some swelling but not discolored. Very sore and can barely walk on it. Reason for Disposition  [1] MODERATE pain (e.g., interferes with normal activities, limping) AND [2] present > 3 days  Answer Assessment - Initial Assessment Questions 1. ONSET: When did the pain start?      Sat 2. LOCATION: Where is the pain located?   (e.g., around nail, entire toe, at foot joint)      Pinky toe  3. PAIN: How bad is the pain?    (Scale 1-10; or mild, moderate, severe)     8 4. APPEARANCE: What does the toe look like? (e.g., redness, swelling, bruising, pallor)     Swollen  5. CAUSE: What do you think is causing the toe pain?     Hit side of dresser  6. OTHER SYMPTOMS: Do you have any other symptoms? (e.g., leg pain, rash, fever, numbness)     Can barely put weight  Protocols used: Toe Pain-A-AH

## 2024-04-20 NOTE — Therapy (Deleted)
 OUTPATIENT PHYSICAL THERAPY TREATMENT  Patient Name: Mark Mcintosh MRN: [id number] DOB:06-26-1971, 53 y.o., male Today's Date: 04/20/2024  PCP: None REFERRING PROVIDER: [name], PA-C  END OF SESSION:        Past Medical History:  Diagnosis Date   Arthritis    Hypertension    Stroke (HCC) 01/09/2024   Stated he had 2 strokes.   Past Surgical History:  Procedure Laterality Date   BREAST BIOPSY Right 03/15/2024   US  RT BREAST BX W LOC DEV 1ST LESION IMG BX SPEC US  GUIDE 03/15/2024 [facility]   TEE WITHOUT CARDIOVERSION N/A 01/14/2024   Procedure: ECHOCARDIOGRAM, TRANSESOPHAGEAL;  Surgeon: Alluri, Keller BROCKS, MD;  Location: [facility] ORS;  Service: Cardiovascular;  Laterality: N/A;   Patient Active Problem List   Diagnosis Date Noted   Hemiparesis affecting left side as late effect of cerebrovascular accident (HCC) 04/01/2024   Mass of lower outer quadrant of right breast 02/27/2024   History of cerebrovascular accident (CVA) with residual deficit 02/27/2024   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) 02/27/2024   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) 02/27/2024   Gait abnormality 02/27/2024   Left-sided weakness 02/27/2024   Prediabetes 02/27/2024   Abnormal pulse oximetry 02/27/2024   Vitamin D  deficiency 02/27/2024   Bradycardia 02/27/2024   Depression, major, single episode, moderate (HCC) 02/27/2024   Coping style affecting medical condition 01/20/2024   Cerebrovascular accident (CVA) (HCC) 01/14/2024   Acute CVA (cerebrovascular accident) (HCC) 01/09/2024   Tobacco use disorder 01/09/2024   Obesity (BMI 30-39.9) 01/09/2024   Hypertension     ONSET DATE: 01/09/24  REFERRING DIAG: I63.9 (ICD-10-CM) - Acute CVA (cerebrovascular accident) (HCC)   THERAPY DIAG:  Difficulty in walking, not elsewhere classified  Unsteadiness on feet  Other abnormalities of gait and mobility  Rationale for Evaluation  and Treatment: Rehabilitation  SUBJECTIVE:                                                                                                                                                                                             SUBJECTIVE STATEMENT:  Pt reports doing well today. But reports that last week while he was carrying weighted box, he felt a pull/pop in the L hand; this swelling has continued to improve per his report. Reports he is excited to meet his Aunt on his fathers side for the first rime this weekend.    PERTINENT HISTORY: [facility] ED 01/09/24 for progressively worsening L sided-weakness. multiple acute infarcts concerning for embolic showering. PMH includes arthritis, HTN. 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   PAIN:  Are you having pain? No  PRECAUTIONS: None  WEIGHT BEARING RESTRICTIONS: No  FALLS: Has patient fallen in last 6 months? Yes. Number of falls 1- pt reports that he tried to get up at home after returning from [facility] and he got up too fast and fell. 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  DIAGNOSTIC FINDINGS: via chart  From 01/09/24 MRI Brain W/O Contrast: IMPRESSION: 1. Acute infarcts in the posterior limb of the right internal capsule, the overlying right frontal white matter, and pons. Given involvement of multiple vascular territories,  consider an embolic etiology. 2. Multiple remote infarcts and chronic microvascular ischemic disease, detailed above.  From 01/10/24 CT Angio Head Neck W W/O CM: IMPRESSION: 1. No acute intracranial hemorrhage or mass effect. 2. No large vessel occlusion, hemodynamically significant stenosis, or aneurysm in the head or neck. 3. Remote lacunar infarcts in the genu of the left internal capsule, globus pallidus, and right corona radiata. 4. Acute infarct involving the posterior limb of the right internal capsule is less well appreciated at CT. 5. Moderately advanced periventricular white matter disease for age.   FUNCTIONAL TESTS (at eval):  5 times sit-to-stand: 17.9 seconds, pt utilizing arms on knees to assist with standing : 0.56 m/s with 4WW and CGA; 0.73 m/s with no AD and CGA BERG Balance Scale: 33/56 FGA: 18/30 on 02/25/24 : 795 ft with 4WW on 02/25/24  PATIENT SURVEYS (eval):  Stroke Impact Scale: 45/80                                                                                                                             TREATMENT DATE: 04/20/24  Gait training/ TA:   STS x 10 reps no UE assist  Weighted Gait without AD x 46ft with 3# Aws cues for foot clearance. With fatigue on the LUE.  STS x 10 reps no UE assist  Weighted Gait without AD x 432ft with 3# Aws cues for foot clearance. With fatigue on the LUE.  STS x 10 reps no UE assist  Weighted Gait without AD x 419ft with 3# Aws cues for foot clearance. With fatigue on the LUE.   NMR  Activity Description: blaze pods in 1/2 arc in front of resistance cable machine, pt hooked up to 12.5# resistance and has to side step with resistance mostly post around arc to  tap pods Activity Setting:  round 1 sequence, round 2 random  Number of Pods:  4 Cycles/Sets:  2 Duration (Time or Hit Count):  2 min  1/2 romberg (wide) on airex 2 x 1 min ea side  Airex step taps 2 x 10 ea LE to 6 in step   TA- To improve  functional movements patterns for everyday tasks   Stair ascent forward/descent reverse with step through pattern to improve muscle activation and motor plan variation 4steps x 5 bouts  Same but completes laterally on steps x 3 rounds ea side      PATIENT EDUCATION: Education details: Pt has made remarkable toward ADL based goals, however a massive gap remains between activities he has done here in clinic and what he will need to do to prepare for return to work duties.  Person educated: Patient Education method: Explanation, Demonstration, and Handouts Education comprehension: verbalized understanding and returned demonstration  HOME EXERCISE PROGRAM: Access Code: [email or web address]  URL: [email or web address]  Date: 02/17/2024  Prepared by: Darryle Patten  Exercises: - Seated March  - 1 x daily - 3 x weekly - 3 sets - 10 reps  - Standing March with Counter Support  - 1 x daily - 3 x weekly - 3 sets - 10 reps  - Mini Squat with Counter Support  - 1 x daily - 3 x weekly - 3 sets - 10 reps  - Heel Toe Raises with Counter Support  - 1 x daily - 3 x weekly - 3 sets - 10 reps   GOALS: Goals reviewed with patient? Yes  SHORT TERM GOALS: Target date: 03/09/2024  Patient will be independent in home exercise program to improve strength/mobility for better functional independence with ADL. Baseline: HEP reviewed during eval Goal status: ACHIEVED  LONG TERM GOALS: Target date: 05/11/2024  Patient will increase their Stroke Impact Scale score by >10 points for improved function and independence Baseline: 45/80 8/13: 70/80 Goal status: MET   2.  Patient (< 45 years old) will complete five times sit to stand test in < 10 seconds indicating an increased LE strength and improved balance. Baseline: 17.9 seconds with UE support; 03/29/24: 11.7 hands free Goal status: progress made, goal not met   3.  Patient will increase 10 meter walk test to >0.8 m/s as to improve gait speed for better  community ambulation and to reduce fall risk. Baseline: 0.56 m/s with 5TT & 0.73 m/s with no AD ; 8/12: 1.74m/s  Goal status: ACHIEVED  4.  Patient will increase Berg Balance score by > 6 points to demonstrate decreased fall risk during functional activities. Baseline: 33/56; 03/30/24: 54/56 Goal status: ACHIEVED,    5.  Patient will increase Functional Gait Assessment score to >20/30 as to reduce fall risk and improve dynamic gait safety with community ambulation. Baseline:18/30 on 02/25/24; 03/30/24: 27/30 Goal status: ACHIEVED  6.  Patient will increase six minute walk test distance to > 1530ft ft for progression to limited community Ambulator and improved independence with access to community and occupation  Baseline: 718ft with 4WW on 02/25/24; 03/11/24: 916ft c SPC(MET >38ft improvement)  Goal status: MET: adjusted. NEW.   7. Pt will increase SLS to > 20 sec bil to improve safety with ADLs and IADLs including showering, shopping, and work related tasks.   Baseline: 8/13: 4 sec on the R, 3 sec on the L   Goal status NEW.   ASSESSMENT:  CLINICAL IMPRESSION:   Patient continues to focus on  functional strengthening as well as dynamic balance activities this date.  Ensured to complete exercises without putting any significant strain on the left hand as patient is still having some discomfort from lifting boxes in session last week but it is improving per his report.  Patient reports he felt challenged with today's activities generally.  Pt will benefit from continued therapy in order to address his impairments, and improve his ability to return to work-related activities, and to improve his overall quality of life.  OBJECTIVE IMPAIRMENTS: Abnormal gait, decreased activity tolerance, decreased balance, decreased coordination, decreased endurance, decreased mobility, difficulty walking, decreased ROM, decreased strength, hypomobility, impaired perceived functional ability, impaired flexibility,  impaired UE functional use, improper body mechanics, and postural dysfunction.   ACTIVITY LIMITATIONS: carrying, lifting, bending, sitting, standing, squatting, stairs, transfers, bathing, reach over head, hygiene/grooming, and locomotion level  PARTICIPATION LIMITATIONS: meal prep, cleaning, laundry, driving, shopping, community activity, occupation, and yard work  PERSONAL FACTORS: Age and Fitness are also affecting patient's functional outcome.   REHAB POTENTIAL: Good  CLINICAL DECISION MAKING: Evolving/moderate complexity  EVALUATION COMPLEXITY: Moderate  PLAN:  PT FREQUENCY: 2x/week  PT DURATION: 12 weeks  PLANNED INTERVENTIONS: [id number]- PT Re-evaluation, 97750- Physical Performance Testing, 97110-Therapeutic exercises, 97530- Therapeutic activity, [id number]- Neuromuscular re-education, 97535- Self Care, [id number]- Manual therapy, [id number]- Gait training, [phone]- Orthotic Initial, [phone]- Orthotic/Prosthetic subsequent, [phone]- Canalith repositioning, [id number]- Splinting, [id number]- Electrical stimulation (manual), [id number]- Ultrasound, [id number]- Traction (mechanical), [phone] (1-2 muscles), [id number] (3+ muscles)- Dry Needling, Patient/Family education, Balance training, Stair training, Taping, Joint mobilization, Spinal mobilization, Vestibular training, Visual/preceptual remediation/compensation, Cognitive remediation, DME instructions, Cryotherapy, and Moist heat  PLAN FOR NEXT SESSION:   - continue working on job related duties to improve stamina and lifting+mobility/balance abilities - SLS    Lonni KATHEE Gainer PT ,DPT Physical Therapist- [place]  [facility]   1:11 PM 04/20/24

## 2024-04-20 NOTE — Telephone Encounter (Signed)
Noted, TY for the update

## 2024-04-21 ENCOUNTER — Encounter: Payer: Self-pay | Admitting: Family Medicine

## 2024-04-21 ENCOUNTER — Ambulatory Visit (INDEPENDENT_AMBULATORY_CARE_PROVIDER_SITE_OTHER): Admitting: Family Medicine

## 2024-04-21 VITALS — BP 131/68 | HR 96 | Ht 71.0 in | Wt 225.2 lb

## 2024-04-21 DIAGNOSIS — M7989 Other specified soft tissue disorders: Secondary | ICD-10-CM

## 2024-04-21 DIAGNOSIS — I6930 Unspecified sequelae of cerebral infarction: Secondary | ICD-10-CM | POA: Diagnosis not present

## 2024-04-21 DIAGNOSIS — R3911 Hesitancy of micturition: Secondary | ICD-10-CM

## 2024-04-21 DIAGNOSIS — M79675 Pain in left toe(s): Secondary | ICD-10-CM

## 2024-04-21 MED ORDER — CIPROFLOXACIN HCL 500 MG PO TABS: 500.0000 mg | ORAL_TABLET | Freq: Two times a day (BID) | ORAL | 0 refills | Status: AC

## 2024-04-21 NOTE — Progress Notes (Signed)
 Established patient visit   Patient: Mark Mcintosh   DOB: 1971-01-27   53 y.o. Male  MRN: [id number] Visit Date: 04/21/2024  Today's healthcare provider: Nancyann Perry, MD   Chief Complaint  Patient presents with   Acute Visit   Toe Pain    Patient reports falling and hitting his pinky toe on Saturday. He reports severe pain of 8/10. Associated with swelling and redness. He states he is unable to put weight on his foot. He reports taking 2 tabs of acetaminophen  500 mg. Was seen at rehab and they taped it yesterday but still not helping with pain.    Subjective    Discussed the use of AI scribe software for clinical note transcription with the patient, who gave verbal consent to proceed.  History of Present Illness   Mark Mcintosh is a 53 year old male with a history of stroke who presents with a left 5th toe injury and urinary difficulties.  He injured his left toe while vacuuming on Saturday, tripping over a cord and planting his left foot, causing the toe to slam into his chest. He reports that his toe is swollen and describes a sensation of 'popping' in his toes when walking. He is unsure if the toe is fractured or broken and has been taking Tylenol  to manage the pain. The injury was wrapped by his physical therapist during a rehab session on Wednesday.  He had a stroke three months ago, which has left his left side weaker than his right. This weakness contributed to the incident as he was unable to fully catch himself when he tripped.  He also reports urinary difficulties that began after his stroke, including prolonged time to initiate urination and incomplete bladder emptying, requiring him to return to the bathroom shortly after voiding. He describes the urine stream as 'light' and not full. He has not regained his driving privileges since the stroke and relies on public transportation.       Medications: Outpatient Medications Prior to Visit  Medication  Sig   albuterol  (VENTOLIN  HFA) 108 (90 Base) MCG/ACT inhaler Inhale 2 puffs into the lungs every 6 (six) hours as needed for wheezing or shortness of breath.   amLODipine  (NORVASC ) 2.5 MG tablet Take 1 tablet (2.5 mg total) by mouth daily.   aspirin  EC 81 MG tablet Take 1 tablet (81 mg total) by mouth daily. Swallow whole.   atorvastatin  (LIPITOR) 40 MG tablet Take 1 tablet (40 mg total) by mouth daily.   irbesartan  (AVAPRO ) 75 MG tablet Take 1 tablet (75 mg total) by mouth daily.   metFORMIN  (GLUCOPHAGE ) 850 MG tablet Take 1 tablet (850 mg total) by mouth daily with breakfast.   vitamin D3 (CHOLECALCIFEROL ) 25 MCG tablet Take 1 tablet (1,000 Units total) by mouth daily.   nicotine  (NICODERM CQ  - DOSED IN MG/24 HOURS) 21 mg/24hr patch Place 1 patch (21 mg total) onto the skin daily.   No facility-administered medications prior to visit.   Review of Systems  Constitutional:  Negative for appetite change, chills and fever.  Respiratory:  Negative for chest tightness, shortness of breath and wheezing.   Cardiovascular:  Negative for chest pain and palpitations.  Gastrointestinal:  Negative for abdominal pain, nausea and vomiting.       Objective    BP 131/68 (BP Location: Left Arm, Patient Position: Sitting, Cuff Size: Large)   Pulse 96   Ht 5' 11 (1.803 m)   Wt 225 lb 3.2 oz (  102.2 kg)   SpO2 100%   BMI 31.41 kg/m   Physical Exam  Moderate swelling of left 5th toe which is taped to adjacent toe. No drainage or erythema.    Assessment & Plan        Left foot injury, possible fracture Left foot injury with swelling and popping sensation, possible fracture. X-ray needed for diagnosis. Orthopedic evaluation recommended. - Refer to orthopedics for evaluation and management of left foot injury. - Advise to keep foot wrapped until orthopedic evaluation. - Provide address and contact information for [facility].  Urinary heistancy Onset after CVA in May. May be  neurogenic. Differential diagnosis includes prostatitis Elevated C-reactive protein per neurologist suggests inflammation. Antibiotic prescribed. - Cipro  500 BID x 10 days. Follow up with PCP      Nancyann Perry, MD  [facility] [phone] (phone) [phone] (fax)  [facility]

## 2024-04-21 NOTE — Patient Instructions (Signed)
 SABRA  Please review the attached list of medications and notify my office if there are any errors.   . Please bring all of your medications to every appointment so we can make sure that our medication list is the same as yours.

## 2024-04-22 ENCOUNTER — Ambulatory Visit

## 2024-04-22 ENCOUNTER — Ambulatory Visit: Admitting: Speech Pathology

## 2024-04-22 ENCOUNTER — Ambulatory Visit: Attending: Physician Assistant

## 2024-04-22 DIAGNOSIS — R278 Other lack of coordination: Secondary | ICD-10-CM | POA: Insufficient documentation

## 2024-04-22 DIAGNOSIS — R471 Dysarthria and anarthria: Secondary | ICD-10-CM

## 2024-04-22 DIAGNOSIS — R2681 Unsteadiness on feet: Secondary | ICD-10-CM | POA: Insufficient documentation

## 2024-04-22 DIAGNOSIS — R262 Difficulty in walking, not elsewhere classified: Secondary | ICD-10-CM | POA: Diagnosis present

## 2024-04-22 DIAGNOSIS — I639 Cerebral infarction, unspecified: Secondary | ICD-10-CM | POA: Diagnosis present

## 2024-04-22 DIAGNOSIS — M6281 Muscle weakness (generalized): Secondary | ICD-10-CM | POA: Diagnosis present

## 2024-04-22 DIAGNOSIS — R2689 Other abnormalities of gait and mobility: Secondary | ICD-10-CM | POA: Insufficient documentation

## 2024-04-22 NOTE — Therapy (Signed)
 OUTPATIENT OCCUPATIONAL THERAPY NEURO TREATMENT NOTE  Patient Name: Mark Mcintosh MRN: [id number] DOB:12-26-1970, 53 y.o., male Today's Date: 04/22/2024  PCP: No PCP REFERRING PROVIDER: [name], PA-C  END OF SESSION:   OT End of Session - 04/22/24 1434     Visit Number 14    Number of Visits 24    Date for OT Re-Evaluation 05/11/24    OT Start Time 1400    OT Stop Time 1445    OT Time Calculation (min) 45 min    Activity Tolerance Patient tolerated treatment well    Behavior During Therapy [facility] for tasks assessed/performed             Past Medical History:  Diagnosis Date   Acute CVA (cerebrovascular accident) (HCC) 01/09/2024   Arthritis    Hypertension    Stroke (HCC) 01/09/2024   Stated he had 2 strokes.   Past Surgical History:  Procedure Laterality Date   BREAST BIOPSY Right 03/15/2024   US  RT BREAST BX W LOC DEV 1ST LESION IMG BX SPEC US  GUIDE 03/15/2024 [facility]   TEE WITHOUT CARDIOVERSION N/A 01/14/2024   Procedure: ECHOCARDIOGRAM, TRANSESOPHAGEAL;  Surgeon: Alluri, Keller BROCKS, MD;  Location: [facility] ORS;  Service: Cardiovascular;  Laterality: N/A;   Patient Active Problem List   Diagnosis Date Noted   Hemiparesis affecting left side as late effect of cerebrovascular accident (HCC) 04/01/2024   Mass of lower outer quadrant of right breast 02/27/2024   History of cerebrovascular accident (CVA) with residual deficit 02/27/2024   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) 02/27/2024   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) 02/27/2024   Gait abnormality 02/27/2024   Left-sided weakness 02/27/2024   Prediabetes 02/27/2024   Abnormal pulse oximetry 02/27/2024   Vitamin D  deficiency 02/27/2024   Bradycardia 02/27/2024   Depression, major, single episode, moderate (HCC) 02/27/2024   Coping style affecting medical condition 01/20/2024   Tobacco use disorder 01/09/2024   Obesity (BMI 30-39.9)  01/09/2024   Hypertension    ONSET DATE: 01/09/24  REFERRING DIAG: I63.9 (ICD-10-CM) - Acute CVA (cerebrovascular accident) (HCC)   THERAPY DIAG:  Muscle weakness (generalized)  Other lack of coordination  Acute CVA (cerebrovascular accident) (HCC)  Rationale for Evaluation and Treatment: Rehabilitation  SUBJECTIVE:  SUBJECTIVE STATEMENT: Pt reported a fall on Saturday after tripping over the vacuum cord.  Pt reports he stubbed his L 5th toe and it has been extremely painful.  Pt has a scheduled appt tomorrow at the orthopedist to assess his foot tomorrow. Pt accompanied by: self  PERTINENT HISTORY: Per chart:  Mark Mcintosh is a 53 y.o. right-handed male with history significant for bilateral conjunctival inflammation hypertension as well as tobacco use and class II morbid obesity with BMI 36.26.  Per chart review patient lives alone independent prior to admission.  Presented to [facility] 01/09/2024 with acute onset of left-sided weakness.  MRI showed acute infarct in the posterior limb of the right internal capsule, the overlying right frontal white matter and pons.  Multiple remote infarcts and chronic microvascular ischemic disease.  CTA showed no large vessel occlusion.  Admission chemistries unremarkable except potassium 3.1.  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  TEE with small PFO per cardiology services with no plan for closure.  Neurology follow-up placed on aspirin  and Plavix  for CVA prophylaxis x 3 weeks then aspirin  alone.  Lovenox  for DVT prophylaxis but bilateral Doppler studies negative.  Therapy evaluations completed due to patient decreased functional  mobility left-sided weakness was admitted for a comprehensive rehab program.   PRECAUTIONS: Fall  WEIGHT BEARING RESTRICTIONS: No  PAIN: 04/22/24: 8/10 L 5th toe Are you having pain? 0/10  FALLS: Has patient fallen in last 6 months? Yes. 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  Get back my independence.    OBJECTIVE:  Note: Objective measures were completed at Evaluation unless otherwise noted.  HAND DOMINANCE: Right  ADLs: Overall ADLs: Son was staying with pt for about a week after d/c from inpatient rehab, but has since returned home.  Friend comes by every other day to check in.   Transfers/ambulation related to ADLs: modified indep with rollator Eating: increased difficulty with cutting food  Grooming: increased difficulty with clipping nails, otherwise manages fine with dominant/unaffected hand UB Dressing: increased time with clothing fasteners LB Dressing: increased time with clothing fasteners  Toileting: increased time to engage L hand into clothing management Bathing: pt sits on shower chair to wash LEs, some SOB with standing in shower Tub Shower transfers: Modified indep Equipment: Shower seat without back  IADLs: Shopping: motorized cart, can push cart for shorter shopping trips.  Light housekeeping: extra time and cautions to avoid falls, sometimes feels SOB with laundry  Meal Prep: friend helps with stove top cooking, pt can do light hot and cold meal prep  Community mobility: rollator, friend drove pt  Medication management: indep  Landscape architect: indep  Handwriting: NT; L non-dominant hand affected   MOBILITY STATUS: Hx of falls, rollator or funiture/wall walking in the home, rollator for community  POSTURE COMMENTS:  L sided hemiparesis   ACTIVITY TOLERANCE: Activity tolerance: Pt reports some dyspnea with prolonged standing/mobility  UPPER EXTREMITY ROM:  BUEs WFL  UPPER EXTREMITY  MMT:     MMT Right eval Left eval Left 03/31/24  Shoulder flexion 5 4- 4-  Shoulder abduction 5 4- 4-  Shoulder adduction     Shoulder extension     Shoulder internal rotation   4-  Shoulder external rotation   3+  Middle trapezius     Lower trapezius     Elbow flexion 5 4- 4+  Elbow extension 5 4- 4+  Wrist flexion 5 4- 4+  Wrist extension 5 4- 4  Wrist ulnar deviation     Wrist radial deviation     Wrist pronation 5 4- 4+  Wrist supination 5 4- 4+  (Blank rows = not tested)  HAND FUNCTION: Eval: Grip strength: Right: 111 lbs; Left: 51 lbs, Lateral pinch: Right: 23 lbs, Left: 13 lbs, and 3 point pinch: Right: 26 lbs, Left: 13 lbs 03/31/24: Grip strength: Right: 101 lbs, Left: 31 lbs, lateral pinch: Right: 23 lbs, Left: 13 lbs, 3 point pinch: Right: 25 lbs, Left: 14 lbs  04/06/24: Grip strength: Left: 32 lbs 04/13/24: Grip strength: Left: 45 lbs    COORDINATION: Finger Nose Finger test: increased time and decreased accuracy on the L  9 Hole Peg test: Right: 25 sec; Left: 1 min 27 sec 03/31/24: L 52 sec, 1 min 4 sec, 39 sec  SENSATION: Pt reports increased tingling/numbness when hand balls up a little, but when he straightens it out it goes back to normal.   EDEMA: no visible edema   MUSCLE TONE: LUE: Mild   COGNITION: Overall cognitive status: Within functional limits for tasks assessed pt reportme difficulties with memory   VISION:             Subjective report: Wears glasses all the time.  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                                                                                                              TREATMENT DATE: 04/22/24 Therapeutic Activity: -Facilitated LUE GMC/FMC working to place and remove therapy resistant clothespins (all colors) on/off vertical and horizontal dowels.  1# wrist weight was donned to L wrist to target L shoulder strength during forward and lateral reaching patterns while working with clothespins. -Facilitated sustained reach and grasp at ~135* shoulder flexion, a second trial at ~135* abd; pt held plastic container in these positions while OT placed clips back into container; vc for maximizing shoulder flexion and abd -Facilitated L forearm, wrist, and hand strengthening with participation in EZ board tools.  Pt worked with long handled tool to facilitate L wrist flex/ext and forearm pron/sup, large base key turn, and large dial turn, completing x3 reps for each tool (up/down board=1 rep).  Pt required intermittent min A from OT to stabilize tool on board d/t LUE ataxia.  Therapeutic Exercise: -L grip strengthening: Hand gripper set at moderate resistance with 2 red bands to complete 3 sets 10 reps of resisted gross grasping. -L shoulder strengthening with 20 reps of forward flex and abd; wrist weight doffed for these reps  Self Care: -Condition management education: Recommendation to ice L foot for pain management.   -CGA for chair to wc transfers today; pt came in with cane but verbalizing 8/10 pain, so OT assisted pt back to OT gym in wc.  OT encouraged pt have volunteers assist pt down to therapy with transport chair next session if pain is still elevated.    PATIENT EDUCATION: Education details: Pain management/functional mobility recommendations  Person educated: Patient Education method: Explanation and Verbal cues Education comprehension: verbalized understanding  HOME EXERCISE PROGRAM: Green theraputty 2-3x per day for 5-10 min periods for L hand grip  strengthening, FMC handout  GOALS: Goals reviewed with patient? Yes  SHORT TERM GOALS: Target date: 03/30/24  Pt will be indep to perform HEP for increasing strength and coordination throughout the LUE. Baseline: Eval: Not yet initiated; 03/31/24: Indep with HEP and verbalizes understanding of progressions made today for frequency Goal status: achieved  LONG  TERM GOALS: Target date: 05/11/24  Pt will increase LUE MMT by 1/2 grade or more to increase engagement of LUE into ADL/IADLs. Baseline: Eval: LUE grossly 4-/5 throughout (R 5/5); 03/31/24: L shoulder grossly 4-, elbow flex 4+, ext 4, forearm pron/sup 4+ Goal status: in progress  2.  Pt will increase L grip strength by 20 lbs or more to enable pt to grasp and carry heavy ADL supplies in L hand. Baseline: Eval: L 51 lbs (R 111 lbs); 03/31/24: L grip 31 lbs (R 101 lbs) Goal status: in progress  3.  Pt will increase L hand [facility] skills as demonstrated by completion of 9 hole peg test in <1 min to improve manipulation of small ADL supplies. Baseline: Eval: L 1 min 27 sec (R 25 sec); 03/31/24: 3 trials completed today: L 52 sec, 1 min 4 sec, 39 sec  Goal status: in progress  4.  Pt will increase LUE GMC to enable confidence with moving hot pots/pans on/off stove top and in/out of the oven using BUEs. Baseline: Eval: Pt reports that his friend currently assists with heavier meal prep; 03/31/24: Pt is now cooking independently, but with increased time/caution Goal status: in progress  ASSESSMENT:  CLINICAL IMPRESSION: Pt disappointed to report a fall over the weekend after tripping over the vacuum cord and stubbing his L 5th toe.  Pt with severe pain levels today in L foot, but verbalized he does have a planned appt with orthopedics tomorrow.  Pt tolerated LUE activities well today.  Pt demonstrates decreased tolerance for sustained reaching above shoulder level, tolerating ~20-30 sec while grasping a container.  Pt acknowledges fatigue when  reaching up during ADL/IADL tasks.  Pt continues to present with L hand weakness, tolerating 2 red bands on hand gripper (moderate resistance) for 3 sets of 10 reps with increased time/effort/rest breaks.  Pt continues to present with mild-moderate ataxia in the LUE; min A to stabilize EZ board tools.  Pt will continue to benefit from skilled OT to work towards above noted goals for improving indep with daily tasks, return to work, reduce burden of care on caregivers, and improve QOL.    PERFORMANCE DEFICITS: in functional skills including ADLs, IADLs, coordination, dexterity, sensation, tone, strength, Fine motor control, Gross motor control, mobility, balance, body mechanics, endurance, decreased knowledge of precautions, decreased knowledge of use of DME, vision, and UE functional use, cognitive skills including memory, and psychosocial skills including coping strategies, environmental adaptation, and routines and behaviors.   IMPAIRMENTS: are limiting patient from ADLs, IADLs, work, and leisure.   CO-MORBIDITIES: has co-morbidities such as tobacco use disorder, HTN, arthritis that affects occupational performance. Patient will benefit from skilled OT to address above impairments and improve overall function.  MODIFICATION OR ASSISTANCE TO COMPLETE EVALUATION: No modification of tasks or assist necessary to complete an evaluation.  OT OCCUPATIONAL PROFILE AND HISTORY: Detailed assessment: Review of records and additional review of physical, cognitive, psychosocial history related to current functional performance.  CLINICAL DECISION MAKING: Moderate - several treatment options, min-mod task modification necessary  REHAB POTENTIAL: Good  EVALUATION COMPLEXITY: Moderate  PLAN:  OT FREQUENCY: 2x/week  OT DURATION: 12 weeks  PLANNED INTERVENTIONS: 97168 OT Re-evaluation, 97535 self care/ADL training, 02889 therapeutic exercise, 97530 therapeutic activity, 97112 neuromuscular re-education,  97140 manual therapy, 97116 gait training, 02989 moist heat, 97010 cryotherapy, 97750 Physical Performance Testing, passive range of motion, balance training, functional mobility training, visual/perceptual remediation/compensation, psychosocial skills training, energy conservation, coping strategies training, patient/family education, and DME and/or  AE instructions  RECOMMENDED OTHER SERVICES: SLP eval and treat d/t pt reporting memory deficits since CVA, drooling, and slurred speech  CONSULTED AND AGREED WITH PLAN OF CARE: Patient  PLAN FOR NEXT SESSION: see above   Inocente Blazing, MS, OTR/L   04/22/2024, 4:25 PM

## 2024-04-22 NOTE — Therapy (Signed)
 OUTPATIENT SPEECH LANGUAGE PATHOLOGY  SWALLOW and DYSARTHRIA TREATMENT NOTE 10th VISIT PROGRESS NOTE   Patient Name: Mark Mcintosh MRN: [id number] DOB:01/15/1971, 53 y.o., male Today's Date: 04/22/2024  PCP: Curtis Boom, FNP REFERRING PROVIDER: Fidela Ned, NP  Speech Therapy Progress Note Dates of reporting period: 03/02/2024 - 04/22/2024 Objective: Patient has been seen for 10 speech therapy sessions this reporting period targeting pt's swallow and dysarthria goals. Patient is making progress toward LTGs and met 4 STGs this reporting period. See skilled intervention, clinical impressions, and goals below for details.    End of Session - 04/22/24 1416     Visit Number 10    Number of Visits 17    Date for SLP Re-Evaluation 04/27/24    Authorization Type [employer]    Progress Note Due on Visit 10    SLP Start Time 1315    SLP Stop Time  1400    SLP Time Calculation (min) 45 min    Activity Tolerance Patient tolerated treatment well          Past Medical History:  Diagnosis Date   Acute CVA (cerebrovascular accident) (HCC) 01/09/2024   Arthritis    Hypertension    Stroke (HCC) 01/09/2024   Stated he had 2 strokes.   Past Surgical History:  Procedure Laterality Date   BREAST BIOPSY Right 03/15/2024   US  RT BREAST BX W LOC DEV 1ST LESION IMG BX SPEC US  GUIDE 03/15/2024 [facility]   TEE WITHOUT CARDIOVERSION N/A 01/14/2024   Procedure: ECHOCARDIOGRAM, TRANSESOPHAGEAL;  Surgeon: Alluri, Keller BROCKS, MD;  Location: [facility] ORS;  Service: Cardiovascular;  Laterality: N/A;   Patient Active Problem List   Diagnosis Date Noted   Hemiparesis affecting left side as late effect of cerebrovascular accident (HCC) 04/01/2024   Mass of lower outer quadrant of right breast 02/27/2024   History of cerebrovascular accident (CVA) with residual deficit 02/27/2024   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) 02/27/2024   Facial droop as  late effect of cerebrovascular accident (CVA) 02/27/2024   Gait abnormality 02/27/2024   Left-sided weakness 02/27/2024   Prediabetes 02/27/2024   Abnormal pulse oximetry 02/27/2024   Vitamin D  deficiency 02/27/2024   Bradycardia 02/27/2024   Depression, major, single episode, moderate (HCC) 02/27/2024   Coping style affecting medical condition 01/20/2024   Tobacco use disorder 01/09/2024   Obesity (BMI 30-39.9) 01/09/2024   Hypertension     ONSET DATE: 01/09/2024   REFERRING DIAG:  R47.1 (ICD-10-CM) - Dysarthria  R13.10 (ICD-10-CM) - Dysphagia    THERAPY DIAG:  Dysarthria and anarthria  Rationale for Evaluation and Treatment Rehabilitation  SUBJECTIVE:   PERTINENT HISTORY and DIAGNOSTIC FINDINGS: Pt is a right handed 53 year old male with past medical history of hypertension and tobacco use disorder. He presented to [facility] ED on 01/09/2024 with left sided weakness. MRI on 01/10/2024 revealed 1. Acute infarcts in the posterior limb of the right internal  capsule, the overlying right frontal white matter, and pons. Given  involvement of multiple vascular territories, consider an embolic  etiology.  2. Multiple remote infarcts and chronic microvascular ischemic  disease. Pt received ST services at [facility] from 01/16/2024 thru 01/29/2024.   PAIN:  Are you having pain? No  FALLS: Has patient fallen in last 6 months?  No  LIVING ENVIRONMENT: Lives with: lives alone Lives in: House/apartment  PLOF:  Level of assistance: Independent with ADLs, Independent with IADLs Employment: Full-time employment  SUBJECTIVE STATEMENT: Pt reported pain level 7/10  d/t injuring his toe.  Pt accompanied by: self  PATIENT GOALS  to improve speech and swallow abilities  OBJECTIVE:   TODAY'S TREATMENT NOTE: Skilled treatment session targeted pt's dysarthria and dysphagia goals. SLP facilitated session by providing the following interventions:  Pt with great speech intelligibility when  communicating at the conversation level (>98%) with Mod I use of strategies. No instances of decreased speech intelligibility or wet vocal quality.   In addition he reports that despite being in pain, his PCP was able to understand his speech on previous day.    PATIENT EDUCATION: Education details: see above Person educated: Patient Education method: Explanation and written cues Education comprehension: verbalized understanding and needs further education  HOME EXERCISE PROGRAM:     Aspiration Precautions  Compensatory Swallow strategies  Complete EMST   GOALS: Goals reviewed with patient? Yes  SHORT TERM GOALS: Target date: 10 sessions  Updated 04/22/2024 With Mod I, pt will keep food and liquid in mouth while eating without losing the bolus out of the front of the mouth to safely consume least restrictive diet. Baseline: Goal status: INITIAL: MET  2.  With Mod I, pt will complete lingual strengthening exercises (anterior and posterior) x 15 times in order to increase muscle strength to reduce oral residue given regular textures.  Baseline:  Goal status: INITIAL: MET pt completing independently  3.  With Mod I, patient will complete 3 sets of 10 repetitions with EMST set at 100cmH2O with self-reported effortful of 7 out of 10. Baseline:  Goal status: INITIAL: MET per pt report  4.  With Mod I, patient will use dysarthria compensations (slow, loud, overpronounce, pause) in 2-3 sentence responses with 95% accuracy. Baseline:  Goal status: INITIAL: MET upgraded goal to With Mod I, pt will use dysarthria compensations (slow, loud over pronounce, pause) at the complex conversation level.    LONG TERM GOALS: Target date: 04/27/2024  Updated 04/22/24 With Mod I, pt will utilize safe swallwoing strategies in order to reduce risk of aspiration, dehydration and weight loss with consuming regular textures. Baseline:  Goal status: INITIAL: MET  2.  With Mod I, pt will demonstrate  the ability to adequately self-monitor swallowing skills and perform appropriate compensatory techniques to reduce s/s of aspiration and to safely consume least restrictive diet.     Baseline:  Goal status: INITIAL: MET  3.  With Mod I, patient will improve speech intelligibility for paragraph by controlling rate of speech, over-articulation, and increased loudness to achieve 95% intelligibility.  Baseline:  Goal status: INITIAL: great progress made   ASSESSMENT:  CLINICAL IMPRESSION: Patient is a 53 year old right handed male who was seen today for an dysarthria and dysphagia treatment d/t recent right internal capsule and pons CVA.    Pt presents with mild to moderate flaccid dysarthria d/t left side facial and lingual weakness that in reduced lip seal (during production of plosives) and imprecise lingual articulation. Pt's vocal quality was perceptionally wetter the longer that he talked. He reports that I start slobbering on the left side when I talk a lot.  Pt presents with mild oropharyngeal dysphagia that is c/b reduced lingual manipulation of boluses and left labial spillage of liquids. Pt reports my swallowing is decent but when I drink water it comes out on the left side. He reports drinking from a large insulated cup rim.   Pt eager and good response to therapy; improved speech intelligibility as a result. See the above treatment note for details.  OBJECTIVE IMPAIRMENTS include dysarthria and dysphagia. These impairments are limiting patient from return to work, effectively communicating at home and in community, and safety when swallowing. Factors affecting potential to achieve goals and functional outcome are Multiple remote infarcts and chronic microvascular ischemic. Patient will benefit from skilled SLP services to address above impairments and improve overall function.  REHAB POTENTIAL: Good  PLAN: SLP FREQUENCY: 1-2x/week  SLP DURATION: 8 weeks  PLANNED  INTERVENTIONS: Aspiration precaution training, Pharyngeal strengthening exercises, Diet toleration management , Trials of upgraded texture/liquids, Oral motor exercises, SLP instruction and feedback, Compensatory strategies, and Patient/family education    Happi B. Rubbie, M.S., CCC-SLP, Tree surgeon Certified Brain Injury Specialist [facility]  [facility] Rehabilitation Services Office [phone] Ascom [phone] Fax [phone]

## 2024-04-27 ENCOUNTER — Ambulatory Visit

## 2024-04-27 DIAGNOSIS — R2689 Other abnormalities of gait and mobility: Secondary | ICD-10-CM

## 2024-04-27 DIAGNOSIS — I639 Cerebral infarction, unspecified: Secondary | ICD-10-CM

## 2024-04-27 DIAGNOSIS — M6281 Muscle weakness (generalized): Secondary | ICD-10-CM

## 2024-04-27 DIAGNOSIS — R278 Other lack of coordination: Secondary | ICD-10-CM

## 2024-04-27 DIAGNOSIS — R262 Difficulty in walking, not elsewhere classified: Secondary | ICD-10-CM

## 2024-04-27 DIAGNOSIS — R2681 Unsteadiness on feet: Secondary | ICD-10-CM

## 2024-04-27 NOTE — Therapy (Signed)
 Pt arrived for PT session reports his xray revealed a toe fracture, is now in a CAM rocker. He is taking tylenol  for pain. Discussed with patient his options: Continuing wth PT as current POC could be limited in participation by new imbalance from CAM rocker which might reduce efficacy of treatment Continued PT POC could aggravate pain and delay healing of fracture Taking a break off may allow for improved pain control and heeling before resuming therapy.   PT FU with ortho at 6 week mark We decided to cancel 2 weeks of PT visits following ortho consult and will plan on FU visit on 05/11/24 for updates. Will contact ortho office prior to that and see if pt can be permitted to resume normal foot wear for PT sessions only.   4:59 PM, 04/27/24 Peggye JAYSON Linear, PT, DPT Physical Therapist -  [facility]  Outpatient Physical Therapy- Main Campus [phone]   \

## 2024-04-27 NOTE — Therapy (Unsigned)
 OUTPATIENT OCCUPATIONAL THERAPY NEURO TREATMENT NOTE  Patient Name: Mark Mcintosh MRN: [id number] DOB:12-Feb-1971, 53 y.o., male Today's Date: 04/27/2024  PCP: No PCP REFERRING PROVIDER: [name], PA-C  END OF SESSION:        Past Medical History:  Diagnosis Date   Acute CVA (cerebrovascular accident) (HCC) 01/09/2024   Arthritis    Hypertension    Stroke (HCC) 01/09/2024   Stated he had 2 strokes.   Past Surgical History:  Procedure Laterality Date   BREAST BIOPSY Right 03/15/2024   US  RT BREAST BX W LOC DEV 1ST LESION IMG BX SPEC US  GUIDE 03/15/2024 [facility]   TEE WITHOUT CARDIOVERSION N/A 01/14/2024   Procedure: ECHOCARDIOGRAM, TRANSESOPHAGEAL;  Surgeon: Alluri, Keller BROCKS, MD;  Location: [facility];  Service: Cardiovascular;  Laterality: N/A;   Patient Active Problem List   Diagnosis Date Noted   Hemiparesis affecting left side as late effect of cerebrovascular accident (HCC) 04/01/2024   Mass of lower outer quadrant of right breast 02/27/2024   History of cerebrovascular accident (CVA) with residual deficit 02/27/2024   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) 02/27/2024   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  Rationale for Evaluation and Treatment: Rehabilitation  SUBJECTIVE:  SUBJECTIVE STATEMENT: Pt reported a fall on Saturday after tripping over the vacuum  cord.  Pt reports he stubbed his L 5th toe and it has been extremely painful.  Pt has a scheduled appt tomorrow at the orthopedist to assess his foot tomorrow. Pt accompanied by: self  PERTINENT HISTORY: Per chart:  Mark Mcintosh is a 53 y.o. right-handed male with history significant for bilateral conjunctival inflammation hypertension as well as tobacco use and class II morbid obesity with BMI 36.26.  Per chart review patient lives alone independent prior to admission.  [facility]ed to [facility] with acute onset of left-sided weakness.  MRI showed acute infarct in the posterior limb of the right internal capsule, the overlying right frontal white matter and pons.  Multiple remote infarcts and chronic microvascular ischemic disease.  CTA showed no large vessel occlusion.  Admission chemistries unremarkable except potassium 3.1.  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  TEE with small PFO per cardiology services with no plan for closure.  Neurology follow-up placed on aspirin  and Plavix  for CVA prophylaxis x 3 weeks then aspirin  alone.  Lovenox  for DVT prophylaxis but bilateral Doppler studies negative.  Therapy evaluations completed due to patient decreased functional mobility left-sided weakness was admitted for a comprehensive rehab program.   PRECAUTIONS: Fall  WEIGHT BEARING RESTRICTIONS: No  PAIN: 04/22/24: 8/10 L 5th toe Are you having pain? 0/10  FALLS: Has patient fallen in last 6 months? Yes. 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  Get back my independence.    OBJECTIVE:  Note: Objective measures were  completed at Evaluation unless otherwise noted.  HAND DOMINANCE: Right  ADLs: Overall ADLs: Son was staying with pt for about a week after d/c from inpatient rehab, but has since returned home.  Friend comes by every other day to check in.   Transfers/ambulation related to ADLs: modified indep with rollator Eating: increased difficulty with cutting food  Grooming: increased difficulty with clipping nails, otherwise manages fine with dominant/unaffected hand UB Dressing: increased time with clothing fasteners LB Dressing: increased time with clothing fasteners  Toileting: increased time to engage L hand into clothing management Bathing: pt sits on shower chair to wash LEs, some SOB with standing in shower Tub Shower transfers: Modified indep Equipment: Shower seat without back  IADLs: Shopping: motorized cart, can push cart for shorter shopping trips.  Light housekeeping: extra time and cautions to avoid falls, sometimes feels SOB with laundry  Meal Prep: friend helps with stove top cooking, pt can do light hot and cold meal prep  Community mobility: rollator, friend drove pt  Medication management: indep  Landscape architect: indep  Handwriting: NT; L non-dominant hand affected   MOBILITY STATUS: Hx of falls, rollator or funiture/wall walking in the home, rollator for community  POSTURE COMMENTS:  L sided hemiparesis   ACTIVITY TOLERANCE: Activity tolerance: Pt reports some dyspnea with prolonged standing/mobility  UPPER EXTREMITY ROM:  BUEs WFL  UPPER EXTREMITY MMT:     MMT Right eval Left eval Left 03/31/24  Shoulder flexion 5 4- 4-  Shoulder abduction 5 4- 4-  Shoulder adduction     Shoulder extension     Shoulder internal rotation   4-  Shoulder external rotation   3+  Middle trapezius     Lower trapezius     Elbow flexion 5 4- 4+  Elbow extension 5 4- 4+  Wrist flexion 5 4- 4+  Wrist extension 5 4- 4  Wrist ulnar deviation     Wrist radial deviation      Wrist pronation 5 4- 4+  Wrist supination 5 4- 4+  (Blank rows = not tested)  HAND FUNCTION: Eval: Grip strength: Right: 111 lbs; Left: 51 lbs, Lateral pinch: Right: 23 lbs, Left: 13 lbs, and 3 point pinch: Right: 26 lbs, Left: 13 lbs 03/31/24: Grip strength: Right: 101 lbs, Left: 31 lbs, lateral pinch: Right: 23 lbs, Left: 13 lbs, 3 point pinch: Right: 25 lbs, Left: 14 lbs  04/06/24: Grip strength: Left: 32 lbs 04/13/24: Grip strength: Left: 45 lbs    COORDINATION: Finger Nose Finger test: increased time and decreased accuracy on the L  9 Hole Peg test: Right: 25 sec; Left: 1 min 27 sec 03/31/24: L 52 sec, 1 min 4 sec, 39 sec   SENSATION: Pt reports increased tingling/numbness when hand balls up a little, but when he straightens it out it goes back to normal.   EDEMA: no visible edema   MUSCLE TONE: LUE: Mild   COGNITION: Overall cognitive status: Within functional limits for tasks assessed pt reportme difficulties with memory   VISION:             Subjective report: Wears glasses all the time.  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                                                                                                             TREATMENT DATE: 04/22/24 Therapeutic Activity: -Facilitated LUE GMC/FMC working to place and remove therapy resistant clothespins (all colors) on/off vertical and horizontal dowels.  1# wrist weight was donned to L wrist to target L shoulder strength during forward and lateral reaching patterns while working with  clothespins. -Facilitated sustained reach and grasp at ~135* shoulder flexion, a second trial at ~135* abd; pt held plastic container in these positions while OT placed clips back into container; vc for maximizing shoulder flexion and abd -Facilitated L forearm, wrist, and hand strengthening with participation in EZ board tools.  Pt worked with long handled tool to facilitate L wrist flex/ext and forearm pron/sup, large base key turn, and large dial turn, completing x3 reps for each tool (up/down board=1 rep).  Pt required intermittent min A from OT to stabilize tool on board d/t LUE ataxia.  Therapeutic Exercise: -L grip strengthening: Hand gripper set at moderate resistance with 2 red bands to complete 3 sets 10 reps of resisted gross grasping. -L shoulder strengthening with 20 reps of forward flex and abd; wrist weight doffed for these reps  Self Care: -Condition management education: Recommendation to ice L foot for pain management.   -CGA for chair to wc transfers today; pt came in with cane but verbalizing 8/10 pain, so OT assisted pt back to OT gym in wc.  OT encouraged pt have volunteers assist pt down to therapy with transport chair next session if pain is still elevated.    PATIENT EDUCATION: Education details: Pain management/functional mobility recommendations  Person educated: Patient Education method: Explanation and Verbal cues Education comprehension: verbalized understanding  HOME EXERCISE PROGRAM: Green theraputty 2-3x per day for 5-10 min periods for L hand grip strengthening, FMC handout  GOALS: Goals reviewed with patient? Yes  SHORT TERM GOALS: Target date: 03/30/24  Pt will be indep to perform HEP for increasing strength and coordination throughout the LUE. Baseline: Eval: Not yet initiated; 03/31/24: Indep with HEP and verbalizes understanding of progressions made today for frequency Goal status: achieved  LONG TERM GOALS: Target date: 05/11/24  Pt will increase  LUE MMT by 1/2 grade or more to increase engagement of LUE into ADL/IADLs. Baseline: Eval: LUE grossly 4-/5 throughout (R 5/5); 03/31/24: L shoulder grossly 4-, elbow flex 4+, ext 4, forearm pron/sup 4+ Goal status: in progress  2.  Pt will increase L grip strength by 20 lbs or more to enable pt to grasp and carry heavy ADL supplies in L hand. Baseline: Eval: L 51 lbs (R 111 lbs); 03/31/24: L grip 31  lbs (R 101 lbs) Goal status: in progress  3.  Pt will increase L hand [facility] skills as demonstrated by completion of 9 hole peg test in <1 min to improve manipulation of small ADL supplies. Baseline: Eval: L 1 min 27 sec (R 25 sec); 03/31/24: 3 trials completed today: L 52 sec, 1 min 4 sec, 39 sec  Goal status: in progress  4.  Pt will increase LUE GMC to enable confidence with moving hot pots/pans on/off stove top and in/out of the oven using BUEs. Baseline: Eval: Pt reports that his friend currently assists with heavier meal prep; 03/31/24: Pt is now cooking independently, but with increased time/caution Goal status: in progress  ASSESSMENT:  CLINICAL IMPRESSION: Pt disappointed to report a fall over the weekend after tripping over the vacuum cord and stubbing his L 5th toe.  Pt with severe pain levels today in L foot, but verbalized he does have a planned appt with orthopedics tomorrow.  Pt tolerated LUE activities well today.  Pt demonstrates decreased tolerance for sustained reaching above shoulder level, tolerating ~20-30 sec while grasping a container.  Pt acknowledges fatigue when reaching up during ADL/IADL tasks.  Pt continues to present with L hand weakness, tolerating 2 red bands on hand gripper (moderate resistance) for 3 sets of 10 reps with increased time/effort/rest breaks.  Pt continues to present with mild-moderate ataxia in the LUE; min A to stabilize EZ board tools.  Pt will continue to benefit from skilled OT to work towards above noted goals for improving indep with daily tasks, return  to work, reduce burden of care on caregivers, and improve QOL.    PERFORMANCE DEFICITS: in functional skills including ADLs, IADLs, coordination, dexterity, sensation, tone, strength, Fine motor control, Gross motor control, mobility, balance, body mechanics, endurance, decreased knowledge of precautions, decreased knowledge of use of DME, vision, and UE functional use, cognitive skills including memory, and psychosocial skills including coping strategies, environmental adaptation, and routines and behaviors.   IMPAIRMENTS: are limiting patient from ADLs, IADLs, work, and leisure.   CO-MORBIDITIES: has co-morbidities such as tobacco use disorder, HTN, arthritis that affects occupational performance. Patient will benefit from skilled OT to address above impairments and improve overall function.  MODIFICATION OR ASSISTANCE TO COMPLETE EVALUATION: No modification of tasks or assist necessary to complete an evaluation.  OT OCCUPATIONAL PROFILE AND HISTORY: Detailed assessment: Review of records and additional review of physical, cognitive, psychosocial history related to current functional performance.  CLINICAL DECISION MAKING: Moderate - several treatment options, min-mod task modification necessary  REHAB POTENTIAL: Good  EVALUATION COMPLEXITY: Moderate  PLAN:  OT FREQUENCY: 2x/week  OT DURATION: 12 weeks  PLANNED INTERVENTIONS: 97168 OT Re-evaluation, 97535 self care/ADL training, 02889 therapeutic exercise, 97530 therapeutic activity, 97112 neuromuscular re-education, 97140 manual therapy, 97116 gait training, 02989 moist heat, 97010 cryotherapy, 97750 Physical Performance Testing, passive range of motion, balance training, functional mobility training, visual/perceptual remediation/compensation, psychosocial skills training, energy conservation, coping strategies training, patient/family education, and DME and/or AE instructions  RECOMMENDED OTHER SERVICES: SLP eval and treat d/t pt  reporting memory deficits since CVA, drooling, and slurred speech  CONSULTED AND AGREED WITH PLAN OF CARE: Patient  PLAN FOR NEXT SESSION: see above   Inocente Blazing, MS, OTR/L   04/27/2024, 3:37 PM

## 2024-04-29 ENCOUNTER — Ambulatory Visit

## 2024-04-29 ENCOUNTER — Ambulatory Visit: Admitting: Speech Pathology

## 2024-05-04 ENCOUNTER — Ambulatory Visit: Admitting: Speech Pathology

## 2024-05-04 ENCOUNTER — Ambulatory Visit

## 2024-05-04 ENCOUNTER — Telehealth: Payer: Self-pay | Admitting: Family Medicine

## 2024-05-04 DIAGNOSIS — M6281 Muscle weakness (generalized): Secondary | ICD-10-CM

## 2024-05-04 DIAGNOSIS — R471 Dysarthria and anarthria: Secondary | ICD-10-CM

## 2024-05-04 DIAGNOSIS — R278 Other lack of coordination: Secondary | ICD-10-CM

## 2024-05-04 DIAGNOSIS — I639 Cerebral infarction, unspecified: Secondary | ICD-10-CM

## 2024-05-04 NOTE — Therapy (Unsigned)
 OUTPATIENT OCCUPATIONAL THERAPY NEURO TREATMENT NOTE  Patient Name: Mark Mcintosh MRN: [id number] DOB:Oct 23, 1970, 53 y.o., male Today's Date: 05/05/2024  PCP: No PCP REFERRING PROVIDER: [name], PA-C  END OF SESSION:   OT End of Session - 05/05/24 1000     Visit Number 16    Number of Visits 24    Date for OT Re-Evaluation 05/11/24    OT Start Time 1400    OT Stop Time 1445    OT Time Calculation (min) 45 min    Activity Tolerance Patient tolerated treatment well    Behavior During Therapy [facility] for tasks assessed/performed             Past Medical History:  Diagnosis Date   Acute CVA (cerebrovascular accident) (HCC) 01/09/2024   Arthritis    Hypertension    Stroke (HCC) 01/09/2024   Stated he had 2 strokes.   Past Surgical History:  Procedure Laterality Date   BREAST BIOPSY Right 03/15/2024   US  RT BREAST BX W LOC DEV 1ST LESION IMG BX SPEC US  GUIDE 03/15/2024 [facility]   TEE WITHOUT CARDIOVERSION N/A 01/14/2024   Procedure: ECHOCARDIOGRAM, TRANSESOPHAGEAL;  Surgeon: Alluri, Keller BROCKS, MD;  Location: [facility] ORS;  Service: Cardiovascular;  Laterality: N/A;   Patient Active Problem List   Diagnosis Date Noted   Hemiparesis affecting left side as late effect of cerebrovascular accident (HCC) 04/01/2024   Mass of lower outer quadrant of right breast 02/27/2024   History of cerebrovascular accident (CVA) with residual deficit 02/27/2024   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) 02/27/2024   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) 02/27/2024   Gait abnormality 02/27/2024   Left-sided weakness 02/27/2024   Prediabetes 02/27/2024   Abnormal pulse oximetry 02/27/2024   Vitamin D  deficiency 02/27/2024   Bradycardia 02/27/2024   Depression, major, single episode, moderate (HCC) 02/27/2024   Coping style affecting medical condition 01/20/2024   Tobacco use disorder 01/09/2024   Obesity (BMI 30-39.9)  01/09/2024   Hypertension    ONSET DATE: 01/09/24  REFERRING DIAG: I63.9 (ICD-10-CM) - Acute CVA (cerebrovascular accident) (HCC)   THERAPY DIAG:  Muscle weakness (generalized)  Other lack of coordination  Acute CVA (cerebrovascular accident) (HCC)  Rationale for Evaluation and Treatment: Rehabilitation  SUBJECTIVE:  SUBJECTIVE STATEMENT: Pt continues to report stiffness in L hand, and verbalizes concern of edema in hand. Pt accompanied by: self  PERTINENT HISTORY: Per chart:  Mark Mcintosh is a 53 y.o. right-handed male with history significant for bilateral conjunctival inflammation hypertension as well as tobacco use and class II morbid obesity with BMI 36.26.  Per chart review patient lives alone independent prior to admission.  Presented to [facility]09/[facility]24 with acute onset of left-sided weakness.  MRI showed acute infarct in the posterior limb of the right internal capsule, the overlying right frontal white matter and pons.  Multiple remote infarcts and chronic microvascular ischemic disease.  CTA showed no large vessel occlusion.  Admission chemistries unremarkable except potassium 3.1.  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  TEE with small PFO per cardiology services with no plan for closure.  Neurology follow-up placed on aspirin  and Plavix  for CVA prophylaxis x 3 weeks then aspirin  alone.  Lovenox  for DVT prophylaxis but bilateral Doppler studies negative.  Therapy evaluations completed due to patient decreased functional mobility left-sided weakness was admitted for a comprehensive rehab program.   PRECAUTIONS: Fall  WEIGHT BEARING RESTRICTIONS: No  PAIN: 05/04/24: 5-6/10 L 5th toe; managed  with tylenol  and CAM boot Are you having pain? 0/10  FALLS: Has patient fallen in last 6 months? Yes. 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  Get back my independence.    OBJECTIVE:  Note: Objective measures were completed at Evaluation unless otherwise noted.  HAND DOMINANCE: Right  ADLs: Overall ADLs: Son was staying with pt for about a week after d/c from inpatient rehab, but has since returned home.  Friend comes by every other day to check in.   Transfers/ambulation related to ADLs: modified indep with rollator Eating: increased difficulty with cutting food  Grooming: increased difficulty with clipping nails, otherwise manages fine with dominant/unaffected hand UB Dressing: increased time with clothing fasteners LB Dressing: increased time with clothing fasteners  Toileting: increased time to engage L hand into clothing management Bathing: pt sits on shower chair to wash LEs, some SOB with standing in shower Tub Shower transfers: Modified indep Equipment: Shower seat without back  IADLs: Shopping: motorized cart, can push cart for shorter shopping trips.  Light housekeeping: extra time and cautions to avoid falls, sometimes feels SOB with laundry  Meal Prep: friend helps with stove top cooking, pt can do light hot and cold meal prep  Community mobility: rollator, friend drove pt  Medication management: indep  Landscape architect: indep  Handwriting: NT; L non-dominant hand affected   MOBILITY STATUS: Hx of falls, rollator or funiture/wall walking in the home, rollator for community  POSTURE COMMENTS:  L sided hemiparesis   ACTIVITY TOLERANCE: Activity tolerance: Pt reports some dyspnea with prolonged standing/mobility  UPPER EXTREMITY ROM:  BUEs WFL  UPPER EXTREMITY MMT:     MMT Right eval Left eval Left 03/31/24  Shoulder flexion 5 4- 4-  Shoulder abduction 5 4-  4-  Shoulder adduction     Shoulder extension     Shoulder internal rotation   4-  Shoulder external rotation   3+  Middle trapezius     Lower trapezius     Elbow flexion 5 4- 4+  Elbow extension 5 4- 4+  Wrist flexion 5 4- 4+  Wrist extension 5 4- 4  Wrist ulnar deviation     Wrist radial deviation     Wrist pronation 5 4- 4+  Wrist supination 5 4- 4+  (Blank rows = not tested)  HAND FUNCTION: Eval: Grip strength: Right: 111 lbs; Left: 51 lbs, Lateral pinch: Right: 23 lbs, Left: 13 lbs, and 3 point pinch: Right: 26 lbs, Left: 13 lbs 03/31/24: Grip strength: Right: 101 lbs, Left: 31 lbs, lateral pinch: Right: 23 lbs, Left: 13 lbs, 3 point pinch: Right: 25 lbs, Left: 14 lbs  04/06/24: Grip strength: Left: 32 lbs 04/13/24: Grip strength: Left: 45 lbs  05/04/24: Grip strength: Left: 24 lb before stretching hand; 40 lbs after stretching; Lateral pinch: Left: 15 lbs, 3 point pinch: Left: 14 lbs    COORDINATION: Finger Nose Finger test: increased time and decreased accuracy on the L  9 Hole Peg test: Right: 25 sec; Left: 1 min 27 sec 03/31/24: L 52 sec, 1 min  4 sec, 39 sec   SENSATION: Pt reports increased tingling/numbness when hand balls up a little, but when he straightens it out it goes back to normal.   EDEMA: no visible edema   MUSCLE TONE: LUE: Mild   COGNITION: Overall cognitive status: Within functional limits for tasks assessed pt reportme difficulties with memory   VISION:             Subjective report: Wears glasses all the time.  Taking new medication for inflammation of bilat conjuctiva (prescribed prior to CVA)  VISION ASSESSMENT: Tracking/Visual pursuits: Able to track stimulus in all quads without difficulty Saccades: additional head turns occurred during testing Visual Fields: no apparent deficits  PERCEPTION: Not tested  PRAXIS: Impaired: Motor planning; mild-moderate apraxia and ataxia throughout the LUE  OBSERVATIONS:  Pt pleasant, cooperative, and  appears eager to work towards OT goals.                                                                                                             TREATMENT DATE: 05/04/24 Therapeutic Activity: -Assessment of edema in L hand, ROM, pinch strength, grip strength pre and post passive stretching (see above)  Therapeutic Exercise: -Completed passive stretching for L hand digits 1-5 for flex/ext at all joints; instructed pt in self passive stretching techniques with good return demo -Completed passive flex/ext of L wrist, active digit abd/add for edema reduction -Instructed in/completed tendon glides for L hand x5 reps, requiring mod tactile cues to achieve hand positions and follow visual handout.   Self Care: -Condition management education: typical to see mild edema in hemiparetic limb post CVA -Review of edema management techniques; encouraged increased wearing of compression glove during the day -HEP review for ROM exercises noted above with encouragement to complete prior to OT sessions in prep for therapeutic exercises and activities   PATIENT EDUCATION: Education details: Condition management; HEP progression (tendon glides/self PROM to L wrist and hand)  Person educated: Patient Education method: Explanation and Verbal cues, demo, visual handout Education comprehension: verbalized understanding, demonstrated understanding, further training needed  HOME EXERCISE PROGRAM: Green theraputty 2-3x per day for 5-10 min periods for L hand grip strengthening, [facility] handout Tendon glides, passive stretching to the L wrist/digits, L hand AROM  GOALS: Goals reviewed with patient? Yes  SHORT TERM GOALS: Target date: 03/30/24  Pt will be indep to perform HEP for increasing strength and coordination throughout the LUE. Baseline: Eval: Not yet initiated; 03/31/24: Indep with HEP and verbalizes understanding of progressions made today for frequency Goal status: achieved  LONG TERM GOALS: Target  date: 05/11/24  Pt will increase LUE MMT by 1/2 grade or more to increase engagement of LUE into ADL/IADLs. Baseline: Eval: LUE grossly 4-/5 throughout (R 5/5); 03/31/24: L shoulder grossly 4-, elbow flex 4+, ext 4, forearm pron/sup 4+ Goal status: in progress  2.  Pt will increase L grip strength by 20 lbs or more to enable pt to grasp and carry heavy ADL supplies in L hand. Baseline: Eval: L 51 lbs (R 111  lbs); 03/31/24: L grip 31 lbs (R 101 lbs) Goal status: in progress  3.  Pt will increase L hand [facility] skills as demonstrated by completion of 9 hole peg test in <1 min to improve manipulation of small ADL supplies. Baseline: Eval: L 1 min 27 sec (R 25 sec); 03/31/24: 3 trials completed today: L 52 sec, 1 min 4 sec, 39 sec  Goal status: in progress  4.  Pt will increase LUE [facility] to enable confidence with moving hot pots/pans on/off stove top and in/out of the oven using BUEs. Baseline: Eval: Pt reports that his friend currently assists with heavier meal prep; 03/31/24: Pt is now cooking independently, but with increased time/caution Goal status: in progress  ASSESSMENT:  CLINICAL IMPRESSION: Pt voiced concern of edema and tightness in L hand.  L grip strength measured at 20 lbs prior to passive stretching of wrist and digits, 40 lbs post stretching.  Edema in L hand remains mild and typical for hemiparetic limb, and unchanged from start of episode.  Pt receptive to recommendations for more frequent passive stretching and wearing of compression glove during the day.  Pt will continue to benefit from skilled OT to work towards above noted goals for improving indep with daily tasks, return to work, reduce burden of care on caregivers, and improve QOL.    PERFORMANCE DEFICITS: in functional skills including ADLs, IADLs, coordination, dexterity, sensation, tone, strength, Fine motor control, Gross motor control, mobility, balance, body mechanics, endurance, decreased knowledge of precautions, decreased  knowledge of use of DME, vision, and UE functional use, cognitive skills including memory, and psychosocial skills including coping strategies, environmental adaptation, and routines and behaviors.   IMPAIRMENTS: are limiting patient from ADLs, IADLs, work, and leisure.   CO-MORBIDITIES: has co-morbidities such as tobacco use disorder, HTN, arthritis that affects occupational performance. Patient will benefit from skilled OT to address above impairments and improve overall function.  MODIFICATION OR ASSISTANCE TO COMPLETE EVALUATION: No modification of tasks or assist necessary to complete an evaluation.  OT OCCUPATIONAL PROFILE AND HISTORY: Detailed assessment: Review of records and additional review of physical, cognitive, psychosocial history related to current functional performance.  CLINICAL DECISION MAKING: Moderate - several treatment options, min-mod task modification necessary  REHAB POTENTIAL: Good  EVALUATION COMPLEXITY: Moderate  PLAN:  OT FREQUENCY: 2x/week  OT DURATION: 12 weeks  PLANNED INTERVENTIONS: 97168 OT Re-evaluation, 97535 self care/ADL training, 02889 therapeutic exercise, 97530 therapeutic activity, 97112 neuromuscular re-education, 97140 manual therapy, 97116 gait training, 02989 moist heat, 97010 cryotherapy, 97750 Physical Performance Testing, passive range of motion, balance training, functional mobility training, visual/perceptual remediation/compensation, psychosocial skills training, energy conservation, coping strategies training, patient/family education, and DME and/or AE instructions  RECOMMENDED OTHER SERVICES: SLP eval and treat d/t pt reporting memory deficits since CVA, drooling, and slurred speech  CONSULTED AND AGREED WITH PLAN OF CARE: Patient  PLAN FOR NEXT SESSION: see above   Inocente Blazing, MS, OTR/L   05/05/2024, 10:03 AM

## 2024-05-04 NOTE — Telephone Encounter (Signed)
 Received fax from [employer].  Placed in Kellie's box.

## 2024-05-04 NOTE — Therapy (Signed)
 OUTPATIENT SPEECH LANGUAGE PATHOLOGY  SWALLOW and DYSARTHRIA TREATMENT NOTE   Patient Name: Mark Mcintosh MRN: [id number] DOB:03-24-1971, 53 y.o., male Today's Date: 05/04/2024  PCP: Curtis Boom, FNP REFERRING PROVIDER: Fidela Ned, NP    End of Session - 05/04/24 1357     Visit Number 11    Number of Visits 17    Date for SLP Re-Evaluation 04/27/24    Authorization Type [employer]    Progress Note Due on Visit 10    SLP Start Time 1315    SLP Stop Time  1400    SLP Time Calculation (min) 45 min    Activity Tolerance Patient tolerated treatment well          Past Medical History:  Diagnosis Date   Acute CVA (cerebrovascular accident) (HCC) 01/09/2024   Arthritis    Hypertension    Stroke (HCC) 01/09/2024   Stated he had 2 strokes.   Past Surgical History:  Procedure Laterality Date   BREAST BIOPSY Right 03/15/2024   US  RT BREAST BX W LOC DEV 1ST LESION IMG BX SPEC US  GUIDE 03/15/2024 [facility]   TEE WITHOUT CARDIOVERSION N/A 01/14/2024   Procedure: ECHOCARDIOGRAM, TRANSESOPHAGEAL;  Surgeon: Alluri, Keller BROCKS, MD;  Location: [facility] ORS;  Service: Cardiovascular;  Laterality: N/A;   Patient Active Problem List   Diagnosis Date Noted   Hemiparesis affecting left side as late effect of cerebrovascular accident (HCC) 04/01/2024   Mass of lower outer quadrant of right breast 02/27/2024   History of cerebrovascular accident (CVA) with residual deficit 02/27/2024   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) 02/27/2024   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) 02/27/2024   Gait abnormality 02/27/2024   Left-sided weakness 02/27/2024   Prediabetes 02/27/2024   Abnormal pulse oximetry 02/27/2024   Vitamin D  deficiency 02/27/2024   Bradycardia 02/27/2024   Depression, major, single episode, moderate (HCC) 02/27/2024   Coping style affecting medical condition 01/20/2024   Tobacco use disorder  01/09/2024   Obesity (BMI 30-39.9) 01/09/2024   Hypertension     ONSET DATE: 01/09/2024   REFERRING DIAG:  R47.1 (ICD-10-CM) - Dysarthria  R13.10 (ICD-10-CM) - Dysphagia    THERAPY DIAG:  Dysarthria and anarthria  Rationale for Evaluation and Treatment Rehabilitation  SUBJECTIVE:   PERTINENT HISTORY and DIAGNOSTIC FINDINGS: Pt is a right handed 53 year old male with past medical history of hypertension and tobacco use disorder. He presented to [facility] ED on 01/09/2024 with left sided weakness. MRI on 01/10/2024 revealed 1. Acute infarcts in the posterior limb of the right internal  capsule, the overlying right frontal white matter, and pons. Given  involvement of multiple vascular territories, consider an embolic  etiology.  2. Multiple remote infarcts and chronic microvascular ischemic  disease. Pt received ST services at [facility] from 01/16/2024 thru 01/29/2024.   PAIN:  Are you having pain? No  FALLS: Has patient fallen in last 6 months?  No  LIVING ENVIRONMENT: Lives with: lives alone Lives in: House/apartment  PLOF:  Level of assistance: Independent with ADLs, Independent with IADLs Employment: Full-time employment  SUBJECTIVE STATEMENT: Pt pleasant, eager Pt accompanied by: self  PATIENT GOALS  to improve speech and swallow abilities  OBJECTIVE:   TODAY'S TREATMENT NOTE: Skilled treatment session targeted pt's dysarthria and dysphagia goals. SLP facilitated session by providing the following interventions:  SLP facilitated treatment by having the pt complete a complex verbal semantic description task. Pt with great speech intelligibility (>98%) with  Mod I use of strategies. No instances of decreased speech intelligibility.  PATIENT EDUCATION: Education details: see above Person educated: Patient Education method: Explanation and written cues Education comprehension: verbalized understanding and needs further education  HOME EXERCISE PROGRAM:     Aspiration  Precautions  Compensatory Swallow strategies  Complete EMST   GOALS: Goals reviewed with patient? Yes  SHORT TERM GOALS: Target date: 10 sessions  Updated 04/22/2024 With Mod I, pt will keep food and liquid in mouth while eating without losing the bolus out of the front of the mouth to safely consume least restrictive diet. Baseline: Goal status: INITIAL: MET  2.  With Mod I, pt will complete lingual strengthening exercises (anterior and posterior) x 15 times in order to increase muscle strength to reduce oral residue given regular textures.  Baseline:  Goal status: INITIAL: MET pt completing independently  3.  With Mod I, patient will complete 3 sets of 10 repetitions with EMST set at 100cmH2O with self-reported effortful of 7 out of 10. Baseline:  Goal status: INITIAL: MET per pt report  4.  With Mod I, patient will use dysarthria compensations (slow, loud, overpronounce, pause) in 2-3 sentence responses with 95% accuracy. Baseline:  Goal status: INITIAL: MET upgraded goal to With Mod I, pt will use dysarthria compensations (slow, loud over pronounce, pause) at the complex conversation level.    LONG TERM GOALS: Target date: 04/27/2024  Updated 04/22/24 With Mod I, pt will utilize safe swallwoing strategies in order to reduce risk of aspiration, dehydration and weight loss with consuming regular textures. Baseline:  Goal status: INITIAL: MET  2.  With Mod I, pt will demonstrate the ability to adequately self-monitor swallowing skills and perform appropriate compensatory techniques to reduce s/s of aspiration and to safely consume least restrictive diet.     Baseline:  Goal status: INITIAL: MET  3.  With Mod I, patient will improve speech intelligibility for paragraph by controlling rate of speech, over-articulation, and increased loudness to achieve 95% intelligibility.  Baseline:  Goal status: INITIAL: great progress made   ASSESSMENT:  CLINICAL IMPRESSION: Patient is  a 53 year old right handed male who was seen today for an dysarthria and dysphagia treatment d/t recent right internal capsule and pons CVA.    Pt presents with mild to moderate flaccid dysarthria d/t left side facial and lingual weakness that in reduced lip seal (during production of plosives) and imprecise lingual articulation. Pt's vocal quality was perceptionally wetter the longer that he talked. He reports that I start slobbering on the left side when I talk a lot.  Pt presents with mild oropharyngeal dysphagia that is c/b reduced lingual manipulation of boluses and left labial spillage of liquids. Pt reports my swallowing is decent but when I drink water it comes out on the left side. He reports drinking from a large insulated cup rim.   Pt eager and good response to therapy; improved speech intelligibility as a result. See the above treatment note for details.    OBJECTIVE IMPAIRMENTS include dysarthria and dysphagia. These impairments are limiting patient from return to work, effectively communicating at home and in community, and safety when swallowing. Factors affecting potential to achieve goals and functional outcome are Multiple remote infarcts and chronic microvascular ischemic. Patient will benefit from skilled SLP services to address above impairments and improve overall function.  REHAB POTENTIAL: Good  PLAN: SLP FREQUENCY: 1-2x/week  SLP DURATION: 8 weeks  PLANNED INTERVENTIONS: Aspiration precaution training, Pharyngeal strengthening exercises, Diet toleration  management , Trials of upgraded texture/liquids, Oral motor exercises, SLP instruction and feedback, Compensatory strategies, and Patient/family education   Hassell Roys, SLP Student Clinician  Happi B. Rubbie, M.S., CCC-SLP, Tree surgeon Certified Brain Injury Specialist [facility]  [facility] Office [phone] Ascom [phone] Fax  [phone]

## 2024-05-06 ENCOUNTER — Ambulatory Visit: Admitting: Speech Pathology

## 2024-05-06 ENCOUNTER — Ambulatory Visit

## 2024-05-06 DIAGNOSIS — I639 Cerebral infarction, unspecified: Secondary | ICD-10-CM

## 2024-05-06 DIAGNOSIS — M6281 Muscle weakness (generalized): Secondary | ICD-10-CM | POA: Diagnosis not present

## 2024-05-06 DIAGNOSIS — R278 Other lack of coordination: Secondary | ICD-10-CM

## 2024-05-06 DIAGNOSIS — R471 Dysarthria and anarthria: Secondary | ICD-10-CM

## 2024-05-06 NOTE — Telephone Encounter (Signed)
 This was sent, uploaded into media tab on 04/26/24, can we please call and see what else is required? I sent office notes last week.

## 2024-05-06 NOTE — Therapy (Signed)
 OUTPATIENT SPEECH LANGUAGE PATHOLOGY  SWALLOW and DYSARTHRIA TREATMENT NOTE AND DISCHARGE NOTE   Patient Name: Mark Mcintosh: [id number] DOB:[birth year], 53 y.o., male Today's Date: 05/06/2024  PCP: Curtis Boom, FNP REFERRING PROVIDER: Fidela Ned, NP    End of Session - 05/06/24 1310     Visit Number 12    Number of Visits 17    Date for Recertification  04/27/24    Authorization Type [employer]    Progress Note Due on Visit 10    SLP Start Time 1315    SLP Stop Time  1400    SLP Time Calculation (min) 45 min    Activity Tolerance Patient tolerated treatment well          Past Medical History:  Diagnosis Date   Acute CVA (cerebrovascular accident) (HCC) 01/09/2024   Arthritis    Hypertension    Stroke (HCC) 01/09/2024   Stated he had 2 strokes.   Past Surgical History:  Procedure Laterality Date   BREAST BIOPSY Right 03/15/2024   US  RT BREAST BX W LOC DEV 1ST LESION IMG BX SPEC US  GUIDE 03/15/2024 [facility]   TEE WITHOUT CARDIOVERSION N/A 01/14/2024   Procedure: ECHOCARDIOGRAM, TRANSESOPHAGEAL;  Surgeon: Alluri, Keller BROCKS, MD;  Location: [facility] ORS;  Service: Cardiovascular;  Laterality: N/A;   Patient Active Problem List   Diagnosis Date Noted   Hemiparesis affecting left side as late effect of cerebrovascular accident (HCC) 04/01/2024   Mass of lower outer quadrant of right breast 02/27/2024   History of cerebrovascular accident (CVA) with residual deficit 02/27/2024   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) 02/27/2024   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) 02/27/2024   Gait abnormality 02/27/2024   Left-sided weakness 02/27/2024   Prediabetes 02/27/2024   Abnormal pulse oximetry 02/27/2024   Vitamin D  deficiency 02/27/2024   Bradycardia 02/27/2024   Depression, major, single episode, moderate (HCC) 02/27/2024   Coping style affecting medical condition 01/20/2024   Tobacco  use disorder 01/09/2024   Obesity (BMI 30-39.9) 01/09/2024   Hypertension     ONSET DATE: 01/09/2024   REFERRING DIAG:  R47.1 (ICD-10-CM) - Dysarthria  R13.10 (ICD-10-CM) - Dysphagia    THERAPY DIAG:  Dysarthria and anarthria  Rationale for Evaluation and Treatment Rehabilitation  SUBJECTIVE:   PERTINENT HISTORY and DIAGNOSTIC FINDINGS: Pt is a right handed 53 year old male with past medical history of hypertension and tobacco use disorder. He [facility] to [facility] ED on 01/09/2024 with left sided weakness. MRI on 01/10/2024 revealed 1. Acute infarcts in the posterior limb of the right internal  capsule, the overlying right frontal white matter, and pons. Given  involvement of multiple vascular territories, consider an embolic  etiology.  2. Multiple remote infarcts and chronic microvascular ischemic  disease. Pt received ST services at [facility] from 01/16/2024 thru 01/29/2024.   PAIN:  Are you having pain? No  FALLS: Has patient fallen in last 6 months?  No  LIVING ENVIRONMENT: Lives with: lives alone Lives in: House/apartment  PLOF:  Level of assistance: Independent with ADLs, Independent with IADLs Employment: Full-time employment  SUBJECTIVE STATEMENT: Pt pleasant, eager Pt accompanied by: self  PATIENT GOALS  to improve speech and swallow abilities  OBJECTIVE:   TODAY'S TREATMENT NOTE: Skilled treatment session targeted pt's dysarthria and dysphagia goals. SLP facilitated session by providing the following interventions:  SLP facilitated treatment by having the pt complete a complex verbal semantic description task. Pt with great speech  intelligibility (>98%) with Mod I use of strategies. No instances of decreased speech intelligibility.      Pt reports friend said you sound like yourself again.    Pt reports heightened emotional state.   PATIENT EDUCATION: Education details: see above Person educated: Patient Education method: Explanation and written  cues Education comprehension: verbalized understanding and needs further education  HOME EXERCISE PROGRAM:     Aspiration Precautions  Compensatory Swallow strategies  Complete EMST   GOALS: Goals reviewed with patient? Yes  SHORT TERM GOALS: Target date: 10 sessions  Updated 04/22/2024  Updated 05/06/2024 With Mod I, pt will keep food and liquid in mouth while eating without losing the bolus out of the front of the mouth to safely consume least restrictive diet. Baseline: Goal status: INITIAL: MET  2.  With Mod I, pt will complete lingual strengthening exercises (anterior and posterior) x 15 times in order to increase muscle strength to reduce oral residue given regular textures.  Baseline:  Goal status: INITIAL: MET pt completing independently  3.  With Mod I, patient will complete 3 sets of 10 repetitions with EMST set at 100cmH2O with self-reported effortful of 7 out of 10. Baseline:  Goal status: INITIAL: MET per pt report  4.  With Mod I, patient will use dysarthria compensations (slow, loud, overpronounce, pause) in 2-3 sentence responses with 95% accuracy. Baseline:  Goal status: INITIAL: MET upgraded goal to With Mod I, pt will use dysarthria compensations (slow, loud over pronounce, pause) at the complex conversation level.    LONG TERM GOALS: Target date: 04/27/2024  Updated 04/22/24  Updated 05/06/2024 With Mod I, pt will utilize safe swallwoing strategies in order to reduce risk of aspiration, dehydration and weight loss with consuming regular textures. Baseline:  Goal status: INITIAL: MET  2.  With Mod I, pt will demonstrate the ability to adequately self-monitor swallowing skills and perform appropriate compensatory techniques to reduce s/s of aspiration and to safely consume least restrictive diet.     Baseline:  Goal status: INITIAL: MET  3.  With Mod I, patient will improve speech intelligibility for paragraph by controlling rate of speech,  over-articulation, and increased loudness to achieve 95% intelligibility.  Baseline:  Goal status: INITIAL: great progress made: MET   ASSESSMENT:  CLINICAL IMPRESSION: Patient is a 53 year old right handed male who was seen today for an dysarthria and dysphagia treatment d/t recent right internal capsule and pons CVA.    Pt has eagerly participated in skilled ST as well as HEP. As a result, he has made great progress towards his dysarthria and dysphagia goals. Currently he reports no overt s/s of aspiration when eating/drinking. His speech intelligibility is also > 98% at the complex conversation level. All education has been completed and pt is appropriate for discharge from services.       Hassell Roys, SLP Student Clinician  Happi B. Rubbie, M.S., CCC-SLP, Tree surgeon Certified Brain Injury Specialist [facility]  [facility] Office [phone] Ascom [phone] Fax [phone]

## 2024-05-06 NOTE — Telephone Encounter (Signed)
 Copied from CRM [id number]. Topic: General - Other >> May 06, 2024  3:44 PM Zebedee SAUNDERS wrote: Reason for CRM: Pt called stated [employer] for his disability faxed forms on 04/15/2024 to be completed. Pt would like a call [phone] back on the completion of forms.

## 2024-05-07 NOTE — Telephone Encounter (Signed)
 Nothing further needs to be done. This was just an FYI since you were asking what further was needed. Mark Mcintosh has already sent requested info as stated in message below

## 2024-05-07 NOTE — Telephone Encounter (Signed)
 I did not see the patient on 04/21/24. I believe the patient saw Dr. Gasper this day. I am added him to the communication

## 2024-05-07 NOTE — Telephone Encounter (Signed)
 Received CRM from pt stating that we need to send [employer] the office note from 04/21/24. Provided fax # and I have sent the office note to [employer] on 05/07/24. I have copied the CRM below.    [place] needs clinic to fax office notes from visit dated 04/21/24 to [employer]. The fax number is [phone]. Please make sure his name and date of birth is on every page. The above from 04/15/24 has been received by [employer].

## 2024-05-08 NOTE — Therapy (Signed)
 OUTPATIENT OCCUPATIONAL THERAPY NEURO TREATMENT NOTE  Patient Name: Mark Mcintosh MRN: [id number] DOB:[birth year], 53 y.o., male Today's Date: 05/08/2024  PCP: No PCP REFERRING PROVIDER: [name], PA-C  END OF SESSION:   OT End of Session - 05/06/24      Visit Number 16     Number of Visits 24     Date for OT Re-Evaluation 05/11/24     OT Start Time 0200p    OT Stop Time 0245p    OT Time Calculation (min) 45 min     Activity Tolerance Patient tolerated treatment well     Behavior During Therapy [facility] for tasks assessed/performed        Past Medical History:  Diagnosis Date   Acute CVA (cerebrovascular accident) (HCC) 01/09/2024   Arthritis    Hypertension    Stroke (HCC) 01/09/2024   Stated he had 2 strokes.   Past Surgical History:  Procedure Laterality Date   BREAST BIOPSY Right 03/15/2024   US  RT BREAST BX W LOC DEV 1ST LESION IMG BX SPEC US  GUIDE 03/15/2024 [facility]   TEE WITHOUT CARDIOVERSION N/A 01/14/2024   Procedure: ECHOCARDIOGRAM, TRANSESOPHAGEAL;  Surgeon: Alluri, Keller BROCKS, MD;  Location: [facility] ORS;  Service: Cardiovascular;  Laterality: N/A;   Patient Active Problem List   Diagnosis Date Noted   Hemiparesis affecting left side as late effect of cerebrovascular accident (HCC) 04/01/2024   Mass of lower outer quadrant of right breast 02/27/2024   History of cerebrovascular accident (CVA) with residual deficit 02/27/2024   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) 02/27/2024   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) 02/27/2024   Gait abnormality 02/27/2024   Left-sided weakness 02/27/2024   Prediabetes 02/27/2024   Abnormal pulse oximetry 02/27/2024   Vitamin D  deficiency 02/27/2024   Bradycardia 02/27/2024   Depression, major, single episode, moderate (HCC) 02/27/2024   Coping style affecting medical condition 01/20/2024   Tobacco use disorder 01/09/2024   Obesity (BMI 30-39.9) 01/09/2024    Hypertension    ONSET DATE: 01/09/24  REFERRING DIAG: I63.9 (ICD-10-CM) - Acute CVA (cerebrovascular accident) (HCC)   THERAPY DIAG:  Muscle weakness (generalized)  Other lack of coordination  Acute CVA (cerebrovascular accident) (HCC)  Rationale for Evaluation and Treatment: Rehabilitation  SUBJECTIVE:  SUBJECTIVE STATEMENT: Pt reports he's been stretching his wrist and fingers consistently and they are feeling better, but does acknowledge they return to feeling stiff after a few hours.   Pt accompanied by: self  PERTINENT HISTORY: Per chart:  Mark Mcintosh is a 53 y.o. right-handed male with history significant for bilateral conjunctival inflammation hypertension as well as tobacco use and class II morbid obesity with BMI 36.26.  Per chart review patient lives alone independent prior to admission.  Presented to [facility] 01/09/2024 with acute onset of left-sided weakness.  MRI showed acute infarct in the posterior limb of the right internal capsule, the overlying right frontal white matter and pons.  Multiple remote infarcts and chronic microvascular ischemic disease.  CTA showed no large vessel occlusion.  Admission chemistries unremarkable except potassium 3.1.  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  TEE with small PFO per cardiology services with no plan for closure.  Neurology follow-up placed on aspirin  and Plavix  for CVA prophylaxis x 3 weeks then aspirin  alone.  Lovenox  for DVT prophylaxis but bilateral Doppler studies negative.  Therapy evaluations completed due to patient decreased functional mobility left-sided weakness was admitted for a comprehensive rehab program.   PRECAUTIONS:  Fall  WEIGHT BEARING RESTRICTIONS: No  PAIN: 05/06/24: 5/10 L 5th toe; managed with tylenol  and CAM boot Are you having pain? 0/10  FALLS: Has patient fallen in last 6 months? Yes. 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  Get back my independence.    OBJECTIVE:  Note: Objective measures were completed at Evaluation unless otherwise noted.  HAND DOMINANCE: Right  ADLs: Overall ADLs: Son was staying with pt for about a week after d/c from inpatient rehab, but has since returned home.  Friend comes by every other day to check in.   Transfers/ambulation related to ADLs: modified indep with rollator Eating: increased difficulty with cutting food  Grooming: increased difficulty with clipping nails, otherwise manages fine with dominant/unaffected hand UB Dressing: increased time with clothing fasteners LB Dressing: increased time with clothing fasteners  Toileting: increased time to engage L hand into clothing management Bathing: pt sits on shower chair to wash LEs, some SOB with standing in shower Tub Shower transfers: Modified indep Equipment: Shower seat without back  IADLs: Shopping: motorized cart, can push cart for shorter shopping trips.  Light housekeeping: extra time and cautions to avoid falls, sometimes feels SOB with laundry  Meal Prep: friend helps with stove top cooking, pt can do light hot and cold meal prep  Community mobility: rollator, friend drove pt  Medication management: indep  Landscape architect: indep  Handwriting: NT; L non-dominant hand affected   MOBILITY STATUS: Hx of falls, rollator or funiture/wall walking in the home, rollator for community  POSTURE COMMENTS:  L sided hemiparesis   ACTIVITY TOLERANCE: Activity tolerance: Pt reports some dyspnea with prolonged standing/mobility  UPPER EXTREMITY ROM:  BUEs WFL  UPPER EXTREMITY MMT:     MMT Right eval  Left eval Left 03/31/24  Shoulder flexion 5 4- 4-  Shoulder abduction 5 4- 4-  Shoulder adduction     Shoulder extension     Shoulder internal rotation   4-  Shoulder external rotation   3+  Middle trapezius     Lower trapezius     Elbow flexion 5 4- 4+  Elbow extension 5 4- 4+  Wrist flexion 5 4- 4+  Wrist extension 5 4- 4  Wrist ulnar deviation     Wrist radial deviation     Wrist pronation 5 4- 4+  Wrist supination 5 4- 4+  (Blank rows = not tested)  HAND FUNCTION: Eval: Grip strength: Right: 111 lbs; Left: 51 lbs, Lateral pinch: Right: 23 lbs, Left: 13 lbs, and 3 point pinch: Right: 26 lbs, Left: 13 lbs 03/31/24: Grip strength: Right: 101 lbs, Left: 31 lbs, lateral pinch: Right: 23 lbs, Left: 13 lbs, 3 point pinch: Right: 25 lbs, Left: 14 lbs  04/06/24: Grip strength: Left: 32 lbs 04/13/24: Grip strength: Left: 45 lbs  05/04/24: Grip strength: Left: 24 lb before stretching hand; 40 lbs after stretching; Lateral pinch: Left: 15 lbs, 3 point pinch: Left: 14 lbs    COORDINATION: Finger Nose Finger test: increased time and decreased accuracy on the L  9 Hole Peg test:  Right: 25 sec; Left: 1 min 27 sec 03/31/24: L 52 sec, 1 min 4 sec, 39 sec   SENSATION: Pt reports increased tingling/numbness when hand balls up a little, but when he straightens it out it goes back to normal.   EDEMA: no visible edema   MUSCLE TONE: LUE: Mild   COGNITION: Overall cognitive status: Within functional limits for tasks assessed pt reportme difficulties with memory   VISION:             Subjective report: Wears glasses all the time.  Taking new medication for inflammation of bilat conjuctiva (prescribed prior to CVA)  VISION ASSESSMENT: Tracking/Visual pursuits: Able to track stimulus in all quads without difficulty Saccades: additional head turns occurred during testing Visual Fields: no apparent deficits  PERCEPTION: Not tested  PRAXIS: Impaired: Motor planning; mild-moderate apraxia and  ataxia throughout the LUE  OBSERVATIONS:  Pt pleasant, cooperative, and appears eager to work towards OT goals.                                                                                                             TREATMENT DATE: 05/06/24 Therapeutic Exercise: -Pt demonstrated passive stretching for L hand digits 1-5 for flex/ext at all joints with good technique -Completed tendon glides for L hand with min tactile cues for form/technique x5 reps -Completed L grip strengthening with hand gripper set at moderate resistance with 3 red bands: 3 sets 10 reps beginning of sesison, 2 sets 10 reps end of session -3# dumbbell for R wrist flex, ext, RD/UD, forearm pron/sup with min vc for technique; 3 sets 10 reps each  Neuro re-ed: -Facilitated L FMC/dexterity skills working on storage and translatory movements.  Pt worked with up to 3 small pegs in hand, and repositioning pegs 1 by 1 from horiz to vertical position in prep for placing pegs into pegboard -Facilitated L GMC skills working to place small pegs into pegboard, with pegboard placed on an incline to challenge reaching toward a target with unsupported LUE.  PATIENT EDUCATION: Education details: HEP review  Person educated: Patient Education method: Explanation and Verbal cues/tactile cues Education comprehension: verbalized understanding, demonstrated understanding, further training needed  HOME EXERCISE PROGRAM: Green theraputty 2-3x per day for 5-10 min periods for L hand grip strengthening, [facility] handout Tendon glides, passive stretching to the L wrist/digits, L hand AROM  GOALS: Goals reviewed with patient? Yes  SHORT TERM GOALS: Target date: 03/30/24  Pt will be indep to perform HEP for increasing strength and coordination throughout the LUE. Baseline: Eval: Not yet initiated; 03/31/24: Indep with HEP and verbalizes understanding of progressions made today for frequency Goal status: achieved  LONG TERM GOALS: Target date:  05/11/24  Pt will increase LUE MMT by 1/2 grade or more to increase engagement of LUE into ADL/IADLs. Baseline: Eval: LUE grossly 4-/5 throughout (R 5/5); 03/31/24: L shoulder grossly 4-, elbow flex 4+, ext 4, forearm pron/sup 4+ Goal status: in progress  2.  Pt will increase L grip strength by 20 lbs or more to enable  pt to grasp and carry heavy ADL supplies in L hand. Baseline: Eval: L 51 lbs (R 111 lbs); 03/31/24: L grip 31 lbs (R 101 lbs) Goal status: in progress  3.  Pt will increase L hand [facility] skills as demonstrated by completion of 9 hole peg test in <1 min to improve manipulation of small ADL supplies. Baseline: Eval: L 1 min 27 sec (R 25 sec); 03/31/24: 3 trials completed today: L 52 sec, 1 min 4 sec, 39 sec  Goal status: in progress  4.  Pt will increase LUE GMC to enable confidence with moving hot pots/pans on/off stove top and in/out of the oven using BUEs. Baseline: Eval: Pt reports that his friend currently assists with heavier meal prep; 03/31/24: Pt is now cooking independently, but with increased time/caution Goal status: in progress  ASSESSMENT:  CLINICAL IMPRESSION: Pt reports edema in fingers has been better with more consistent wearing of compression glove, and frequent passive and AROM of L hand digits throughout the day.  Pt reports that his fingers feel less stiff after stretching, but that the stiffness returns after a few hours.  Continued focus on L hand strengthening and FMC/GMC skills.  Pt is demonstrating improved accuracy when reaching toward a target, but still typically requires slight compensation to hit target by resting his hand on pegboard to place peg when LUE is unsupported on table top.  Though improving, pt experiences frequent dropping of small items from L hand when trying to reposition an object within his fingertips or when moving items from palm to fingertips when several items are in his hand at one time.  Pt will continue to benefit from skilled OT to  work towards above noted goals for improving indep with daily tasks, return to work, reduce burden of care on caregivers, and improve QOL.    PERFORMANCE DEFICITS: in functional skills including ADLs, IADLs, coordination, dexterity, sensation, tone, strength, Fine motor control, Gross motor control, mobility, balance, body mechanics, endurance, decreased knowledge of precautions, decreased knowledge of use of DME, vision, and UE functional use, cognitive skills including memory, and psychosocial skills including coping strategies, environmental adaptation, and routines and behaviors.   IMPAIRMENTS: are limiting patient from ADLs, IADLs, work, and leisure.   CO-MORBIDITIES: has co-morbidities such as tobacco use disorder, HTN, arthritis that affects occupational performance. Patient will benefit from skilled OT to address above impairments and improve overall function.  MODIFICATION OR ASSISTANCE TO COMPLETE EVALUATION: No modification of tasks or assist necessary to complete an evaluation.  OT OCCUPATIONAL PROFILE AND HISTORY: Detailed assessment: Review of records and additional review of physical, cognitive, psychosocial history related to current functional performance.  CLINICAL DECISION MAKING: Moderate - several treatment options, min-mod task modification necessary  REHAB POTENTIAL: Good  EVALUATION COMPLEXITY: Moderate  PLAN:  OT FREQUENCY: 2x/week  OT DURATION: 12 weeks  PLANNED INTERVENTIONS: 97168 OT Re-evaluation, 97535 self care/ADL training, 02889 therapeutic exercise, 97530 therapeutic activity, 97112 neuromuscular re-education, 97140 manual therapy, 97116 gait training, 02989 moist heat, 97010 cryotherapy, 97750 Physical Performance Testing, passive range of motion, balance training, functional mobility training, visual/perceptual remediation/compensation, psychosocial skills training, energy conservation, coping strategies training, patient/family education, and DME and/or AE  instructions  RECOMMENDED OTHER SERVICES: SLP eval and treat d/t pt reporting memory deficits since CVA, drooling, and slurred speech  CONSULTED AND AGREED WITH PLAN OF CARE: Patient  PLAN FOR NEXT SESSION: see above   Inocente Blazing, MS, OTR/L   05/08/2024, 4:41 PM

## 2024-05-11 ENCOUNTER — Ambulatory Visit: Admitting: Speech Pathology

## 2024-05-11 ENCOUNTER — Ambulatory Visit

## 2024-05-11 ENCOUNTER — Ambulatory Visit: Admitting: Physical Therapy

## 2024-05-13 ENCOUNTER — Ambulatory Visit: Admitting: Physical Therapy

## 2024-05-13 ENCOUNTER — Ambulatory Visit

## 2024-05-13 ENCOUNTER — Encounter: Admitting: Speech Pathology

## 2024-05-13 DIAGNOSIS — R278 Other lack of coordination: Secondary | ICD-10-CM

## 2024-05-13 DIAGNOSIS — R2681 Unsteadiness on feet: Secondary | ICD-10-CM

## 2024-05-13 DIAGNOSIS — I639 Cerebral infarction, unspecified: Secondary | ICD-10-CM

## 2024-05-13 DIAGNOSIS — M6281 Muscle weakness (generalized): Secondary | ICD-10-CM

## 2024-05-13 DIAGNOSIS — R262 Difficulty in walking, not elsewhere classified: Secondary | ICD-10-CM

## 2024-05-13 DIAGNOSIS — R2689 Other abnormalities of gait and mobility: Secondary | ICD-10-CM

## 2024-05-13 NOTE — Therapy (Unsigned)
 OUTPATIENT PHYSICAL THERAPY TREATMENT  Patient Name: Mark Mcintosh MRN: [id number] DOB:Aug 17, 1971, 53 y.o., male Today's Date: 05/13/2024  PCP: None REFERRING PROVIDER: [name], PA-C  END OF SESSION:   PT End of Session - 05/13/24 0001     Visit Number 15    Number of Visits 25    Date for Recertification  07/08/24   Progress Note Due on Visit 10    PT Start Time 1450    PT Stop Time 1530    PT Time Calculation (min) 40 min    Equipment Utilized During Treatment Gait belt    Activity Tolerance Patient tolerated treatment well;No increased pain    Behavior During Therapy [facility] for tasks assessed/performed             Past Medical History:  Diagnosis Date   Acute CVA (cerebrovascular accident) (HCC) 01/09/2024   Arthritis    Hypertension    Stroke (HCC) 01/09/2024   Stated he had 2 strokes.   Past Surgical History:  Procedure Laterality Date   BREAST BIOPSY Right 03/15/2024   US  RT BREAST BX W LOC DEV 1ST LESION IMG BX SPEC US  GUIDE 03/15/2024 [facility]   TEE WITHOUT CARDIOVERSION N/A 01/14/2024   Procedure: ECHOCARDIOGRAM, TRANSESOPHAGEAL;  Surgeon: Alluri, Keller BROCKS, MD;  Location: [facility] ORS;  Service: Cardiovascular;  Laterality: N/A;   Patient Active Problem List   Diagnosis Date Noted   Hemiparesis affecting left side as late effect of cerebrovascular accident (HCC) 04/01/2024   Mass of lower outer quadrant of right breast 02/27/2024   History of cerebrovascular accident (CVA) with residual deficit 02/27/2024   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) 02/27/2024   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) 02/27/2024   Gait abnormality 02/27/2024   Left-sided weakness 02/27/2024   Prediabetes 02/27/2024   Abnormal pulse oximetry 02/27/2024   Vitamin D  deficiency 02/27/2024   Bradycardia 02/27/2024   Depression, major, single episode, moderate (HCC) 02/27/2024   Coping style affecting medical  condition 01/20/2024   Tobacco use disorder 01/09/2024   Obesity (BMI 30-39.9) 01/09/2024   Hypertension     ONSET DATE: 01/09/24  REFERRING DIAG: I63.9 (ICD-10-CM) - Acute CVA (cerebrovascular accident) (HCC)   THERAPY DIAG:  Muscle weakness (generalized)  Other lack of coordination  Acute CVA (cerebrovascular accident) (HCC)  Difficulty in walking, not elsewhere classified  Unsteadiness on feet  Other abnormalities of gait and mobility  Rationale for Evaluation and Treatment: Rehabilitation  SUBJECTIVE:  SUBJECTIVE STATEMENT:  Pt reports doing well today. Pt reports back to PT following 2 week hiatus due to L foot fx. Was told  by orthopedic MD to wear boot for 6 weeks starting 9/5, but also states that he went to orthopedic office on Monday and was told to try to perform PT without Cam boot in place, unless pain present. In which case the boot should be worn in PT.     PERTINENT HISTORY: [facility] ED 01/09/24 for progressively worsening L sided-weakness. multiple acute infarcts concerning for embolic showering. PMH includes arthritis, HTN. 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   PAIN:  Are you having pain? No  PRECAUTIONS: None  WEIGHT BEARING RESTRICTIONS: No  FALLS: Has patient fallen in last 6 months? Yes. Number of falls 1- pt reports that he tried to get up at home after returning from [facility] and he got up too fast and fell. Pt also reports stumbling on steps at home  LIVING ENVIRONMENT: Lives with: lives alone- pt's son was staying with him until this past Friday- son lives in [place], pt has friends that come by every other day Lives in: House/apartment Stairs: Yes: External: 4 steps; can reach both Has  following equipment at home: Walker - 4 wheeled and shower chair  PLOF: Independent- pt works with mixing chemicals and dyes  PATIENT GOALS: Pt wants to get back to normal and be able to return to work  OBJECTIVE:  Note: Objective measures were completed at Evaluation unless otherwise noted.  DIAGNOSTIC FINDINGS: via chart  From 01/09/24 MRI Brain W/O Contrast: IMPRESSION: 1. Acute infarcts in the posterior limb of the right internal capsule, the overlying right frontal white matter, and pons. Given involvement of multiple vascular territories, consider an embolic etiology. 2. Multiple remote infarcts and chronic microvascular ischemic disease, detailed above.  From 01/10/24 CT Angio Head Neck W W/O CM: IMPRESSION: 1. No acute intracranial hemorrhage or mass effect. 2. No large vessel occlusion, hemodynamically significant stenosis, or aneurysm in the head or neck. 3. Remote lacunar infarcts in the genu of the left internal capsule, globus pallidus, and right corona radiata. 4. Acute infarct involving the posterior limb of the right internal capsule is less well appreciated at CT. 5. Moderately advanced periventricular white matter disease for age.   MMT:  MMT Right Eval Left Eval  Hip flexion 4- 3+  Hip extension      Hip abduction 5 5  Hip adduction 4+ 4+  Hip internal rotation      Hip external rotation      Knee flexion 5 4+  Knee extension 5 4+  Ankle dorsiflexion 5 4-  Ankle plantarflexion 5 4-  Ankle inversion      Ankle eversion      (Blank rows = not tested)  FUNCTIONAL TESTS (at eval):  5 times sit-to-stand: 17.9 seconds, pt utilizing arms on knees to assist with standing : 0.56 m/s with 4WW and CGA; 0.73 m/s with no AD and CGA BERG Balance Scale: 33/56 FGA: 18/30 on 02/25/24 : 795 ft with 4WW on 02/25/24  PATIENT SURVEYS (eval):  Stroke Impact Scale: 45/80  TREATMENT DATE: 05/13/24  Sit<>stand x 12   Standing at rail:  normal BOS eyes closed x 15 sec  Narrow stance x 1 min no pain  Semitandem x 30 sec bil. No pain  Tandem x 15 sec bil. reports moderate pain with pop in foot.   Due to pain with transitional movement with balance training, PT instructed pt to wear cam boot to further PT treatments until pain is a little more controlled.   LAQ 4# AWs 2 x 12  Hip flexion 4# AW 2 x 12 Hip abduction GTB 2 x 15  Isometric hip adduction 2 x 15  HS curl GTB 2x 12  Transported to OT gym without assist from PT.    PATIENT EDUCATION: Education details:  Need to wear Cam boot to protect  5th tarsal fx. Will   Pt has made remarkable toward ADL based goals, however a massive gap remains between activities he has done here in clinic and what he will need to do to prepare for return to work duties.  Person educated: Patient Education method: Explanation, Demonstration, and Handouts Education comprehension: verbalized understanding and returned demonstration  HOME EXERCISE PROGRAM: Access Code: [email or web address]  URL: [email or web address]  Date: 02/17/2024  Prepared by: Darryle Patten  Exercises: - Seated March  - 1 x daily - 3 x weekly - 3 sets - 10 reps  - Standing March with Counter Support  - 1 x daily - 3 x weekly - 3 sets - 10 reps  - Mini Squat with Counter Support  - 1 x daily - 3 x weekly - 3 sets - 10 reps  - Heel Toe Raises with Counter Support  - 1 x daily - 3 x weekly - 3 sets - 10 reps   GOALS: Goals reviewed with patient? Yes  SHORT TERM GOALS: Target date: 06/04/24  Patient will be independent in home exercise program to improve strength/mobility for better functional independence with ADL. Baseline: HEP reviewed during eval Goal status: ACHIEVED  LONG TERM GOALS: Target date: 07/08/24  Patient will increase their Stroke Impact Scale score by >10 points for improved function and  independence Baseline: 45/80 8/13: 70/80 Goal status: MET   2.  Patient (< 75 years old) will complete five times sit to stand test in < 10 seconds indicating an increased LE strength and improved balance. Baseline: 17.9 seconds with UE support; 03/29/24: 11.7 hands free Goal status: progress made, goal not met   3.  Patient will increase 10 meter walk test to >0.8 m/s as to improve gait speed for better community ambulation and to reduce fall risk. Baseline: 0.56 m/s with 5TT & 0.73 m/s with no AD ; 8/12: 1.38m/s  Goal status: ACHIEVED  4.  Patient will increase Berg Balance score by > 6 points to demonstrate decreased fall risk during functional activities. Baseline: 33/56; 03/30/24: 54/56 Goal status: ACHIEVED,    5.  Patient will increase Functional Gait Assessment score to >20/30 as to reduce fall risk and improve dynamic gait safety with community ambulation. Baseline:18/30 on 02/25/24; 03/30/24: 27/30 Goal status: ACHIEVED  6.  Patient will increase six minute walk test distance to > 155ft ft for progression to limited community Ambulator and improved independence with access to community and occupation  Baseline: 715ft with 4WW on 02/25/24; 03/11/24: 99ft c SPC(MET >50ft improvement)  Goal status: MET: adjusted. NEW.   7. Pt will increase SLS to > 20 sec bil to improve safety with ADLs and IADLs including showering, shopping,  and work related tasks.   Baseline: 8/13: 4 sec on the R, 3 sec on the L   Goal status NEW.   ASSESSMENT:  CLINICAL IMPRESSION:   Patient continues to focus on functional strengthening as well as trialed gentle balance training without cam boot in place.  Pain and pop noted by pt, so no additional standing interventions performed. Instructed pt to wear cam boot to PT for next session to trial PT with boot in place. My need to hold PT until cleared for full ROM without boot and no Pain. Pt will benefit from continued therapy in order to address his  impairments, and improve his ability to return to work-related activities, and to improve his overall quality of life.  OBJECTIVE IMPAIRMENTS: Abnormal gait, decreased activity tolerance, decreased balance, decreased coordination, decreased endurance, decreased mobility, difficulty walking, decreased ROM, decreased strength, hypomobility, impaired perceived functional ability, impaired flexibility, impaired UE functional use, improper body mechanics, and postural dysfunction.   ACTIVITY LIMITATIONS: carrying, lifting, bending, sitting, standing, squatting, stairs, transfers, bathing, reach over head, hygiene/grooming, and locomotion level  PARTICIPATION LIMITATIONS: meal prep, cleaning, laundry, driving, shopping, community activity, occupation, and yard work  PERSONAL FACTORS: Age and Fitness are also affecting patient's functional outcome.   REHAB POTENTIAL: Good  CLINICAL DECISION MAKING: Evolving/moderate complexity  EVALUATION COMPLEXITY: Moderate  PLAN:  PT FREQUENCY: 2x/week  PT DURATION: 12 weeks  PLANNED INTERVENTIONS: [id number]- PT Re-evaluation, 97750- Physical Performance Testing, 97110-Therapeutic exercises, 97530- Therapeutic activity, [id number]- Neuromuscular re-education, 97535- Self Care, [id number]- Manual therapy, [id number]- Gait training, [phone]- Orthotic Initial, [phone]- Orthotic/Prosthetic subsequent, [phone]- Canalith repositioning, [phone]- Splinting, [phone]- Electrical stimulation (manual), [id number]- Ultrasound, [id number]- Traction (mechanical), 20560 (1-2 muscles), [id number] (3+ muscles)- Dry Needling, Patient/Family education, Balance training, Stair training, Taping, Joint mobilization, Spinal mobilization, Vestibular training, Visual/preceptual remediation/compensation, Cognitive remediation, DME instructions, Cryotherapy, and Mcintosh heat  PLAN FOR NEXT SESSION:   - continue working on job related duties to improve stamina and lifting+mobility/balance abilities - SLS    Massie FORBES Dollar PT  ,DPT Physical Therapist- [place]  [facility]   2:51 PM 05/13/24

## 2024-05-13 NOTE — Therapy (Unsigned)
 OUTPATIENT OCCUPATIONAL THERAPY NEURO TREATMENT NOTE  Patient Name: Mark Mcintosh MRN: [id number] DOB:[birth year], 53 y.o., male Today's Date: 05/13/2024  PCP: No PCP REFERRING PROVIDER: [name], PA-C  END OF SESSION:   OT End of Session - 05/06/24      Visit Number 16     Number of Visits 24     Date for OT Re-Evaluation 05/11/24     OT Start Time 0200p    OT Stop Time 0245p    OT Time Calculation (min) 45 min     Activity Tolerance Patient tolerated treatment well     Behavior During Therapy [facility] for tasks assessed/performed        Past Medical History:  Diagnosis Date   Acute CVA (cerebrovascular accident) (HCC) 01/09/2024   Arthritis    Hypertension    Stroke (HCC) 01/09/2024   Stated he had 2 strokes.   Past Surgical History:  Procedure Laterality Date   BREAST BIOPSY Right 03/15/2024   US  RT BREAST BX W LOC DEV 1ST LESION IMG BX SPEC US  GUIDE 03/15/2024 [facility]   TEE WITHOUT CARDIOVERSION N/A 01/14/2024   Procedure: ECHOCARDIOGRAM, TRANSESOPHAGEAL;  Surgeon: Alluri, Keller BROCKS, MD;  Location: [facility] ORS;  Service: Cardiovascular;  Laterality: N/A;   Patient Active Problem List   Diagnosis Date Noted   Hemiparesis affecting left side as late effect of cerebrovascular accident (HCC) 04/01/2024   Mass of lower outer quadrant of right breast 02/27/2024   History of cerebrovascular accident (CVA) with residual deficit 02/27/2024   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) 02/27/2024   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  Rationale for Evaluation and Treatment: Rehabilitation  SUBJECTIVE:  SUBJECTIVE STATEMENT: Pt reports he's been stretching his wrist and fingers consistently and they are feeling better, but does acknowledge they return to feeling stiff after a few hours.   Pt accompanied by: self  PERTINENT HISTORY: Per chart:  Awesome Jared is a 53 y.o. right-handed male with history significant for bilateral conjunctival inflammation hypertension as well as tobacco use and class II morbid obesity with BMI 36.26.  Per chart review patient lives alone independent prior to admission.  [facility]ed to [facility]/09/[facility]24 with acute onset of left-sided weakness.  MRI showed acute infarct in the posterior limb of the right internal capsule, the overlying right frontal white matter and pons.  Multiple remote infarcts and chronic microvascular ischemic disease.  CTA showed no large vessel occlusion.  Admission chemistries unremarkable except potassium 3.1.  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  TEE with small PFO per cardiology services with no plan for closure.  Neurology follow-up placed on aspirin  and Plavix  for CVA prophylaxis x 3 weeks then aspirin  alone.  Lovenox  for DVT prophylaxis but bilateral Doppler studies negative.  Therapy evaluations completed due to patient decreased functional mobility left-sided weakness was admitted for a comprehensive rehab program.   PRECAUTIONS: Fall  WEIGHT BEARING RESTRICTIONS: No  PAIN: 05/13/24: 4-5 in  L foot with activity; no pain in the LUE Are you having pain? 0/10  FALLS: Has patient fallen in last 6 months? Yes. 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  Get back my independence.    OBJECTIVE:  Note: Objective measures were completed at Evaluation unless otherwise noted.  HAND DOMINANCE: Right  ADLs: Overall ADLs: Son was staying with pt for about a week after d/c from inpatient rehab, but has since returned home.  Friend comes by every other day to check in.   Transfers/ambulation related to ADLs: modified indep with rollator Eating: increased difficulty with cutting food  Grooming: increased difficulty with clipping nails, otherwise manages fine with dominant/unaffected hand UB Dressing: increased time with clothing fasteners LB Dressing: increased time with clothing fasteners  Toileting: increased time to engage L hand into clothing management Bathing: pt sits on shower chair to wash LEs, some SOB with standing in shower Tub Shower transfers: Modified indep Equipment: Shower seat without back  IADLs: Shopping: motorized cart, can push cart for shorter shopping trips.  Light housekeeping: extra time and cautions to avoid falls, sometimes feels SOB with laundry  Meal Prep: friend helps with stove top cooking, pt can do light hot and cold meal prep  Community mobility: rollator, friend drove pt  Medication management: indep  Landscape architect: indep  Handwriting: NT; L non-dominant hand affected   MOBILITY STATUS: Hx of falls, rollator or funiture/wall walking in the home, rollator for community  POSTURE COMMENTS:  L sided hemiparesis   ACTIVITY TOLERANCE: Activity tolerance: Pt reports some dyspnea with prolonged standing/mobility  UPPER EXTREMITY ROM:  BUEs WFL  UPPER EXTREMITY MMT:     MMT Right eval Left eval Left 03/31/24 Left 05/13/24  Shoulder flexion 5 4- 4- 4+  Shoulder abduction 5 4-  4- 4+  Shoulder adduction      Shoulder extension      Shoulder internal rotation   4- 4+  Shoulder external rotation   3+ 4  Middle trapezius      Lower trapezius      Elbow flexion 5 4- 4+ 5  Elbow extension 5 4- 4+ 5  Wrist flexion 5 4- 4+ 5  Wrist extension 5 4- 4 4+  Wrist ulnar deviation      Wrist radial deviation      Wrist pronation 5 4- 4+ 4+  Wrist supination 5 4- 4+ 4+  (Blank rows = not tested)  HAND FUNCTION: Eval: Grip strength: Right: 111 lbs; Left: 51 lbs, Lateral pinch: Right: 23 lbs, Left: 13 lbs, and 3 point pinch: Right: 26 lbs, Left: 13 lbs 03/31/24: Grip strength: Right: 101 lbs, Left: 31 lbs, lateral pinch: Right: 23 lbs, Left: 13 lbs, 3 point pinch: Right: 25 lbs, Left: 14 lbs  04/06/24: Grip strength: Left: 32 lbs 04/13/24: Grip strength: Left: 45 lbs  05/04/24: Grip strength: Left: 24 lb before stretching hand; 40 lbs after stretching; Lateral pinch: Left: 15 lbs, 3 point pinch: Left: 14 lbs  05/13/24: Grip strength: Left: 44 lb; Lateral pinch: Left: 16 lbs, 3  point pinch: Left: 13 lbs   COORDINATION: Finger Nose Finger test: increased time and decreased accuracy on the L  9 Hole Peg test: Right: 25 sec; Left: 1 min 27 sec 03/31/24: L 52 sec, 1 min 4 sec, 39 sec  05/13/24: 43 sec   Finger knee to nose 5x: R: 5 sec  L: 6 sec, mild dysmetria  SENSATION: Pt reports increased tingling/numbness when hand balls up a little, but when he straightens it out it goes back to normal.   EDEMA: no visible edema   MUSCLE TONE: LUE: Mild   COGNITION: Overall cognitive status: Within functional limits for tasks assessed pt reportme difficulties with memory   VISION:             Subjective report: Wears glasses all the time.  Taking new medication for inflammation of bilat conjuctiva (prescribed prior to CVA)  VISION ASSESSMENT: Tracking/Visual pursuits: Able to track stimulus in all quads without difficulty Saccades: additional head turns occurred during  testing Visual Fields: no apparent deficits  PERCEPTION: Not tested  PRAXIS: Impaired: Motor planning; mild-moderate apraxia and ataxia throughout the LUE  OBSERVATIONS:  Pt pleasant, cooperative, and appears eager to work towards OT goals.                                                                                                             TREATMENT DATE: 05/13/24 Therapeutic Exercise: -Pt demonstrated passive stretching for L hand digits 1-5 for flex/ext at all joints with good technique -Completed tendon glides for L hand with min tactile cues for form/technique x5 reps -Completed L grip strengthening with hand gripper set at moderate resistance with 3 red bands: 3 sets 10 reps beginning of sesison, 2 sets 10 reps end of session -3# dumbbell for R wrist flex, ext, RD/UD, forearm pron/sup with min vc for technique; 3 sets 10 reps each  Neuro re-ed: -Facilitated L FMC/dexterity skills working on storage and translatory movements.  Pt worked with up to 3 small pegs in hand, and repositioning pegs 1 by 1 from horiz to vertical position in prep for placing pegs into pegboard -Facilitated L GMC skills working to place small pegs into pegboard, with pegboard placed on an incline to challenge reaching toward a target with unsupported LUE.  PATIENT EDUCATION: Education details: HEP review  Person educated: Patient Education method: Explanation and Verbal cues/tactile cues Education comprehension: verbalized understanding, demonstrated understanding, further training needed  HOME EXERCISE PROGRAM: Green theraputty 2-3x per day for 5-10 min periods for L hand grip strengthening, [facility] handout Tendon glides, passive stretching to the L wrist/digits, L hand AROM  GOALS: Goals reviewed with patient? Yes  SHORT TERM GOALS: Target date: 03/30/24  Pt will be indep to perform HEP for increasing strength and coordination throughout the LUE. Baseline: Eval: Not yet initiated; 03/31/24: Indep  with HEP and verbalizes understanding of progressions made today for frequency Goal status: achieved  LONG TERM GOALS: Target date: 05/11/24  Pt will increase LUE MMT by 1/2 grade or more to increase engagement of  LUE into ADL/IADLs. Baseline: Eval: LUE grossly 4-/5 throughout (R 5/5); 03/31/24: L shoulder grossly 4-, elbow flex 4+, ext 4, forearm pron/sup 4+ Goal status: in progress  2.  Pt will increase L grip strength by 20 lbs or more to enable pt to grasp and carry heavy ADL supplies in L hand. Baseline: Eval: L 51 lbs (R 111 lbs); 03/31/24: L grip 31 lbs (R 101 lbs) Goal status: in progress  3.  Pt will increase L hand [facility] skills as demonstrated by completion of 9 hole peg test in <1 min to improve manipulation of small ADL supplies. Baseline: Eval: L 1 min 27 sec (R 25 sec); 03/31/24: 3 trials completed today: L 52 sec, 1 min 4 sec, 39 sec  Goal status: in progress  4.  Pt will increase LUE GMC to enable confidence with moving hot pots/pans on/off stove top and in/out of the oven using BUEs. Baseline: Eval: Pt reports that his friend currently assists with heavier meal prep; 03/31/24: Pt is now cooking independently, but with increased time/caution Goal status: in progress  ASSESSMENT:  CLINICAL IMPRESSION: Pt reports edema in fingers has been better with more consistent wearing of compression glove, and frequent passive and AROM of L hand digits throughout the day.  Pt reports that his fingers feel less stiff after stretching, but that the stiffness returns after a few hours.  Continued focus on L hand strengthening and FMC/GMC skills.  Pt is demonstrating improved accuracy when reaching toward a target, but still typically requires slight compensation to hit target by resting his hand on pegboard to place peg when LUE is unsupported on table top.  Though improving, pt experiences frequent dropping of small items from L hand when trying to reposition an object within his fingertips or when  moving items from palm to fingertips when several items are in his hand at one time.  Pt will continue to benefit from skilled OT to work towards above noted goals for improving indep with daily tasks, return to work, reduce burden of care on caregivers, and improve QOL.    PERFORMANCE DEFICITS: in functional skills including ADLs, IADLs, coordination, dexterity, sensation, tone, strength, Fine motor control, Gross motor control, mobility, balance, body mechanics, endurance, decreased knowledge of precautions, decreased knowledge of use of DME, vision, and UE functional use, cognitive skills including memory, and psychosocial skills including coping strategies, environmental adaptation, and routines and behaviors.   IMPAIRMENTS: are limiting patient from ADLs, IADLs, work, and leisure.   CO-MORBIDITIES: has co-morbidities such as tobacco use disorder, HTN, arthritis that affects occupational performance. Patient will benefit from skilled OT to address above impairments and improve overall function.  MODIFICATION OR ASSISTANCE TO COMPLETE EVALUATION: No modification of tasks or assist necessary to complete an evaluation.  OT OCCUPATIONAL PROFILE AND HISTORY: Detailed assessment: Review of records and additional review of physical, cognitive, psychosocial history related to current functional performance.  CLINICAL DECISION MAKING: Moderate - several treatment options, min-mod task modification necessary  REHAB POTENTIAL: Good  EVALUATION COMPLEXITY: Moderate  PLAN:  OT FREQUENCY: 2x/week  OT DURATION: 12 weeks  PLANNED INTERVENTIONS: 97168 OT Re-evaluation, 97535 self care/ADL training, 02889 therapeutic exercise, 97530 therapeutic activity, 97112 neuromuscular re-education, 97140 manual therapy, 97116 gait training, 02989 moist heat, 97010 cryotherapy, 97750 Physical Performance Testing, passive range of motion, balance training, functional mobility training, visual/perceptual  remediation/compensation, psychosocial skills training, energy conservation, coping strategies training, patient/family education, and DME and/or AE instructions  RECOMMENDED OTHER SERVICES: SLP eval and treat  d/t pt reporting memory deficits since CVA, drooling, and slurred speech  CONSULTED AND AGREED WITH PLAN OF CARE: Patient  PLAN FOR NEXT SESSION: see above   Inocente Blazing, MS, OTR/L   05/13/2024, 3:32 PM

## 2024-05-18 ENCOUNTER — Encounter: Admitting: Speech Pathology

## 2024-05-18 ENCOUNTER — Ambulatory Visit

## 2024-05-18 ENCOUNTER — Encounter

## 2024-05-18 DIAGNOSIS — M6281 Muscle weakness (generalized): Secondary | ICD-10-CM

## 2024-05-18 DIAGNOSIS — I639 Cerebral infarction, unspecified: Secondary | ICD-10-CM

## 2024-05-18 DIAGNOSIS — R262 Difficulty in walking, not elsewhere classified: Secondary | ICD-10-CM

## 2024-05-18 DIAGNOSIS — R278 Other lack of coordination: Secondary | ICD-10-CM

## 2024-05-18 DIAGNOSIS — R2681 Unsteadiness on feet: Secondary | ICD-10-CM

## 2024-05-18 DIAGNOSIS — R2689 Other abnormalities of gait and mobility: Secondary | ICD-10-CM

## 2024-05-18 NOTE — Therapy (Signed)
 OUTPATIENT PHYSICAL THERAPY TREATMENT  Patient Name: Lleyton Byers MRN: [id number] DOB:30-May-1971, 53 y.o., male Today's Date: 05/18/2024  PCP: None REFERRING PROVIDER: [name], PA-C  END OF SESSION:   PT End of Session - 05/13/24 0001     Visit Number 15    Number of Visits 25    Date for Recertification  07/08/24   Progress Note Due on Visit 10    PT Start Time 1450    PT Stop Time 1530    PT Time Calculation (min) 40 min    Equipment Utilized During Treatment Gait belt    Activity Tolerance Patient tolerated treatment well;No increased pain    Behavior During Therapy [facility] for tasks assessed/performed             Past Medical History:  Diagnosis Date   Acute CVA (cerebrovascular accident) (HCC) 01/09/2024   Arthritis    Hypertension    Stroke (HCC) 01/09/2024   Stated he had 2 strokes.   Past Surgical History:  Procedure Laterality Date   BREAST BIOPSY Right 03/15/2024   US  RT BREAST BX W LOC DEV 1ST LESION IMG BX SPEC US  GUIDE 03/15/2024 [facility]   TEE WITHOUT CARDIOVERSION N/A 01/14/2024   Procedure: ECHOCARDIOGRAM, TRANSESOPHAGEAL;  Surgeon: Alluri, Keller BROCKS, MD;  Location: [facility] ORS;  Service: Cardiovascular;  Laterality: N/A;   Patient Active Problem List   Diagnosis Date Noted   Hemiparesis affecting left side as late effect of cerebrovascular accident (HCC) 04/01/2024   Mass of lower outer quadrant of right breast 02/27/2024   History of cerebrovascular accident (CVA) with residual deficit 02/27/2024   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) 02/27/2024   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) 02/27/2024   Gait abnormality 02/27/2024   Left-sided weakness 02/27/2024   Prediabetes 02/27/2024   Abnormal pulse oximetry 02/27/2024   Vitamin D  deficiency 02/27/2024   Bradycardia 02/27/2024   Depression, major, single episode, moderate (HCC) 02/27/2024   Coping style affecting medical  condition 01/20/2024   Tobacco use disorder 01/09/2024   Obesity (BMI 30-39.9) 01/09/2024   Hypertension     ONSET DATE: 01/09/24  REFERRING DIAG: I63.9 (ICD-10-CM) - Acute CVA (cerebrovascular accident) (HCC)   THERAPY DIAG:  Muscle weakness (generalized)  Other lack of coordination  Acute CVA (cerebrovascular accident) (HCC)  Difficulty in walking, not elsewhere classified  Unsteadiness on feet  Other abnormalities of gait and mobility  Rationale for Evaluation and Treatment: Rehabilitation  SUBJECTIVE:  SUBJECTIVE STATEMENT:  Pt reports doing okay and denies any falls or pain. States doesn't go back to MD until 05/26/2024.      PERTINENT HISTORY: [facility] ED 01/09/24 for progressively worsening L sided-weakness. multiple acute infarcts concerning for embolic showering. PMH includes arthritis, HTN. 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   PAIN:  Are you having pain? No  PRECAUTIONS: None  WEIGHT BEARING RESTRICTIONS: No  FALLS: Has patient fallen in last 6 months? Yes. Number of falls 1- pt reports that he tried to get up at home after returning from [facility] and he got up too fast and fell. Pt also reports stumbling on steps at home  LIVING ENVIRONMENT: Lives with: lives alone- pt's son was staying with him until this past Friday- son lives in [place], pt has friends that come by every other day Lives in: House/apartment Stairs: Yes: External: 4 steps; can reach both Has following equipment at home: Walker - 4 wheeled and shower chair  PLOF: Independent- pt works with mixing chemicals and dyes  PATIENT GOALS: Pt wants to get back to normal and be able to return to work  OBJECTIVE:  Note: Objective measures were  completed at Evaluation unless otherwise noted.  DIAGNOSTIC FINDINGS: via chart  From 01/09/24 MRI Brain W/O Contrast: IMPRESSION: 1. Acute infarcts in the posterior limb of the right internal capsule, the overlying right frontal white matter, and pons. Given involvement of multiple vascular territories, consider an embolic etiology. 2. Multiple remote infarcts and chronic microvascular ischemic disease, detailed above.  From 01/10/24 CT Angio Head Neck W W/O CM: IMPRESSION: 1. No acute intracranial hemorrhage or mass effect. 2. No large vessel occlusion, hemodynamically significant stenosis, or aneurysm in the head or neck. 3. Remote lacunar infarcts in the genu of the left internal capsule, globus pallidus, and right corona radiata. 4. Acute infarct involving the posterior limb of the right internal capsule is less well appreciated at CT. 5. Moderately advanced periventricular white matter disease for age.   MMT:  MMT Right Eval Left Eval  Hip flexion 4- 3+  Hip extension      Hip abduction 5 5  Hip adduction 4+ 4+  Hip internal rotation      Hip external rotation      Knee flexion 5 4+  Knee extension 5 4+  Ankle dorsiflexion 5 4-  Ankle plantarflexion 5 4-  Ankle inversion      Ankle eversion      (Blank rows = not tested)  FUNCTIONAL TESTS (at eval):  5 times sit-to-stand: 17.9 seconds, pt utilizing arms on knees to assist with standing : 0.56 m/s with 4WW and CGA; 0.73 m/s with no AD and CGA BERG Balance Scale: 33/56 FGA: 18/30 on 02/25/24 : 795 ft with 4WW on 02/25/24  PATIENT SURVEYS (eval):  Stroke Impact Scale: 45/80  TREATMENT DATE: 05/18/24  TA: Sit<>stand 2 x 10 reps   .   THEREX:  Seated hip abd (single LE) BTB 3 x 10 reps  Seated hip IR (ball squeeze + BTB pull feet apart) 3 x 10 reps LAQ 5# AWs 3 x 10 reps each  LE Hip flexion BTB 3 x 10 Each LE Hip abduction BTB 3x10 each LE HS curl BTB 3 x 10 each LE    PATIENT EDUCATION: Education details:  Need to wear Cam boot to protect  5th tarsal fx. Will   Pt has made remarkable toward ADL based goals, however a massive gap remains between activities he has done here in clinic and what he will need to do to prepare for return to work duties.  Person educated: Patient Education method: Explanation, Demonstration, and Handouts Education comprehension: verbalized understanding and returned demonstration  HOME EXERCISE PROGRAM: Access Code: [email or web address]  URL: [email or web address]  Date: 02/17/2024  Prepared by: Darryle Patten  Exercises: - Seated March  - 1 x daily - 3 x weekly - 3 sets - 10 reps  - Standing March with Counter Support  - 1 x daily - 3 x weekly - 3 sets - 10 reps  - Mini Squat with Counter Support  - 1 x daily - 3 x weekly - 3 sets - 10 reps  - Heel Toe Raises with Counter Support  - 1 x daily - 3 x weekly - 3 sets - 10 reps   GOALS: Goals reviewed with patient? Yes  SHORT TERM GOALS: Target date: 06/04/24  Patient will be independent in home exercise program to improve strength/mobility for better functional independence with ADL. Baseline: HEP reviewed during eval Goal status: ACHIEVED  LONG TERM GOALS: Target date: 07/08/24  Patient will increase their Stroke Impact Scale score by >10 points for improved function and independence Baseline: 45/80 8/13: 70/80 Goal status: MET   2.  Patient (< 25 years old) will complete five times sit to stand test in < 10 seconds indicating an increased LE strength and improved balance. Baseline: 17.9 seconds with UE support; 03/29/24: 11.7 hands free Goal status: progress made, goal not met   3.  Patient will increase 10 meter walk test to >0.8 m/s as to improve gait speed for better community ambulation and to reduce fall risk. Baseline: 0.56 m/s with 5TT & 0.73 m/s with no AD ;  8/12: 1.14m/s  Goal status: ACHIEVED  4.  Patient will increase Berg Balance score by > 6 points to demonstrate decreased fall risk during functional activities. Baseline: 33/56; 03/30/24: 54/56 Goal status: ACHIEVED,    5.  Patient will increase Functional Gait Assessment score to >20/30 as to reduce fall risk and improve dynamic gait safety with community ambulation. Baseline:18/30 on 02/25/24; 03/30/24: 27/30 Goal status: ACHIEVED  6.  Patient will increase six minute walk test distance to > 152ft ft for progression to limited community Ambulator and improved independence with access to community and occupation  Baseline: 776ft with 4WW on 02/25/24; 03/11/24: 968ft c SPC(MET >74ft improvement)  Goal status: MET: adjusted. NEW.   7. Pt will increase SLS to > 20 sec bil to improve safety with ADLs and IADLs including showering, shopping, and work related tasks.   Baseline: 8/13: 4 sec on the R, 3 sec on the L   Goal status NEW.   ASSESSMENT:  CLINICAL IMPRESSION:   Treatment limited to LE strengthening with minimal weight bearing today as patient is doing well but doesn't  go back to MD until next week. He performed well with resistive Exercises and able to progress with more resistive band and ankle weight resistance. Will plan to hold next 2 PT visits until patient is seen by MD and cleared for full ROM without boot and no Pain. Pt will benefit from continued therapy in order to address his impairments, and improve his ability to return to work-related activities, and to improve his overall quality of life.  OBJECTIVE IMPAIRMENTS: Abnormal gait, decreased activity tolerance, decreased balance, decreased coordination, decreased endurance, decreased mobility, difficulty walking, decreased ROM, decreased strength, hypomobility, impaired perceived functional ability, impaired flexibility, impaired UE functional use, improper body mechanics, and postural dysfunction.   ACTIVITY LIMITATIONS:  carrying, lifting, bending, sitting, standing, squatting, stairs, transfers, bathing, reach over head, hygiene/grooming, and locomotion level  PARTICIPATION LIMITATIONS: meal prep, cleaning, laundry, driving, shopping, community activity, occupation, and yard work  PERSONAL FACTORS: Age and Fitness are also affecting patient's functional outcome.   REHAB POTENTIAL: Good  CLINICAL DECISION MAKING: Evolving/moderate complexity  EVALUATION COMPLEXITY: Moderate  PLAN:  PT FREQUENCY: 2x/week  PT DURATION: 12 weeks  PLANNED INTERVENTIONS: [id number]- PT Re-evaluation, 97750- Physical Performance Testing, 97110-Therapeutic exercises, 97530- Therapeutic activity, [id number]- Neuromuscular re-education, 97535- Self Care, [id number]- Manual therapy, [id number]- Gait training, [id number]- Orthotic Initial, [id number]- Orthotic/Prosthetic subsequent, [phone]- Canalith repositioning, [id number]- Splinting, [id number]- Electrical stimulation (manual), [id number]- Ultrasound, [id number]- Traction (mechanical), [phone] (1-2 muscles), [id number] (3+ muscles)- Dry Needling, Patient/Family education, Balance training, Stair training, Taping, Joint mobilization, Spinal mobilization, Vestibular training, Visual/preceptual remediation/compensation, Cognitive remediation, DME instructions, Cryotherapy, and Moist heat  PLAN FOR NEXT SESSION:  -Discussion f/u visit with MD and make sure foot is cleared for full weight bearing activities.  - continue working on job related duties to improve stamina and lifting+mobility/balance abilities - SLS    Reyes LOISE London PT  Physical Therapist- [facility]   5:29 PM 05/18/24

## 2024-05-20 ENCOUNTER — Ambulatory Visit: Attending: Physician Assistant

## 2024-05-20 ENCOUNTER — Ambulatory Visit: Admitting: Physical Therapy

## 2024-05-20 ENCOUNTER — Encounter: Admitting: Speech Pathology

## 2024-05-20 ENCOUNTER — Telehealth: Payer: Self-pay | Admitting: Family Medicine

## 2024-05-20 DIAGNOSIS — R2681 Unsteadiness on feet: Secondary | ICD-10-CM | POA: Insufficient documentation

## 2024-05-20 DIAGNOSIS — R278 Other lack of coordination: Secondary | ICD-10-CM | POA: Insufficient documentation

## 2024-05-20 DIAGNOSIS — R2689 Other abnormalities of gait and mobility: Secondary | ICD-10-CM | POA: Insufficient documentation

## 2024-05-20 DIAGNOSIS — M6281 Muscle weakness (generalized): Secondary | ICD-10-CM | POA: Diagnosis present

## 2024-05-20 DIAGNOSIS — R471 Dysarthria and anarthria: Secondary | ICD-10-CM | POA: Insufficient documentation

## 2024-05-20 DIAGNOSIS — R262 Difficulty in walking, not elsewhere classified: Secondary | ICD-10-CM | POA: Insufficient documentation

## 2024-05-20 DIAGNOSIS — I639 Cerebral infarction, unspecified: Secondary | ICD-10-CM | POA: Diagnosis present

## 2024-05-20 NOTE — Therapy (Unsigned)
 OUTPATIENT OCCUPATIONAL THERAPY NEURO TREATMENT NOTE  Patient Name: Mark Mcintosh MRN: [id number] DOB:10/26/1970, 53 y.o., male Today's Date: 05/21/2024  PCP: No PCP REFERRING PROVIDER: [name], PA-C  END OF SESSION:   OT End of Session - 05/21/24 1621     Visit Number 19    Number of Visits 41    Date for Recertification  08/05/24    Progress Note Due on Visit 20    OT Start Time 1400    OT Stop Time 1445    OT Time Calculation (min) 45 min    Activity Tolerance Patient tolerated treatment well    Behavior During Therapy [facility] for tasks assessed/performed         Past Medical History:  Diagnosis Date   Acute CVA (cerebrovascular accident) (HCC) 01/09/2024   Arthritis    Hypertension    Stroke (HCC) 01/09/2024   Stated he had 2 strokes.   Past Surgical History:  Procedure Laterality Date   BREAST BIOPSY Right 03/15/2024   US  RT BREAST BX W LOC DEV 1ST LESION IMG BX SPEC US  GUIDE 03/15/2024 [facility]   TEE WITHOUT CARDIOVERSION N/A 01/14/2024   Procedure: ECHOCARDIOGRAM, TRANSESOPHAGEAL;  Surgeon: Mark Mcintosh, Mark BROCKS, MD;  Location: [facility] ORS;  Service: Cardiovascular;  Laterality: N/A;   Patient Active Problem List   Diagnosis Date Noted   Hemiparesis affecting left side as late effect of cerebrovascular accident (HCC) 04/01/2024   Mass of lower outer quadrant of right breast 02/27/2024   History of cerebrovascular accident (CVA) with residual deficit 02/27/2024   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) 02/27/2024   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) 02/27/2024   Gait abnormality 02/27/2024   Left-sided weakness 02/27/2024   Prediabetes 02/27/2024   Abnormal pulse oximetry 02/27/2024   Vitamin D  deficiency 02/27/2024   Bradycardia 02/27/2024   Depression, major, single episode, moderate (HCC) 02/27/2024   Coping style affecting medical condition 01/20/2024   Tobacco use disorder 01/09/2024    Obesity (BMI 30-39.9) 01/09/2024   Hypertension    ONSET DATE: 01/09/24  REFERRING DIAG: I63.9 (ICD-10-CM) - Acute CVA (cerebrovascular accident) (HCC)   THERAPY DIAG:  Muscle weakness (generalized)  Other lack of coordination  Acute CVA (cerebrovascular accident) (HCC)  Rationale for Evaluation and Treatment: Rehabilitation  SUBJECTIVE:  SUBJECTIVE STATEMENT: Pt reports feeling a little more emotional since his stroke.   Pt accompanied by: self  PERTINENT HISTORY: Per chart:  [name] is a 53 y.o. right-handed male with history significant for bilateral conjunctival inflammation hypertension as well as tobacco use and class II morbid obesity with BMI 36.26.  Per chart review patient lives alone independent prior to admission.  Presented to [facility] 01/09/2024 with acute onset of left-sided weakness.  MRI showed acute infarct in the posterior limb of the right internal capsule, the overlying right frontal white matter and pons.  Multiple remote infarcts and chronic microvascular ischemic disease.  CTA showed no large vessel occlusion.  Admission chemistries unremarkable except potassium 3.1.  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  TEE with small PFO per cardiology services with no plan for closure.  Neurology follow-up placed on aspirin  and Plavix  for CVA prophylaxis x 3 weeks then aspirin  alone.  Lovenox  for DVT prophylaxis but bilateral Doppler studies negative.  Therapy evaluations completed due to patient decreased functional mobility left-sided weakness was admitted for a comprehensive rehab program.   PRECAUTIONS: Fall  WEIGHT BEARING RESTRICTIONS: No  PAIN: 05/20/24: no pain in  foot after otc pain meds and when wearing walking boot today Are you having pain? 0/10  FALLS: Has patient fallen in last 6 months? Yes. 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  Get back my independence.    OBJECTIVE:  Note: Objective measures were completed at Evaluation unless otherwise noted.  HAND DOMINANCE: Right  ADLs: Overall ADLs: Son was staying with pt for about a week after d/c from inpatient rehab, but has since returned home.  Friend comes by every other day to check in.   Transfers/ambulation related to ADLs: modified indep with rollator Eating: increased difficulty with cutting food  Grooming: increased difficulty with clipping nails, otherwise manages fine with dominant/unaffected hand UB Dressing: increased time with clothing fasteners LB Dressing: increased time with clothing fasteners  Toileting: increased time to engage L hand into clothing management Bathing: pt sits on shower chair to wash LEs, some SOB with standing in shower Tub Shower transfers: Modified indep Equipment: Shower seat without back  IADLs: Shopping: motorized cart, can push cart for shorter shopping trips.  Light housekeeping: extra time and cautions to avoid falls, sometimes feels SOB with laundry  Meal Prep: friend helps with stove top cooking, pt can do light hot and cold meal prep  Community mobility: rollator, friend drove pt  Medication management: indep  Landscape architect: indep  Handwriting: NT; L non-dominant hand affected   MOBILITY STATUS: Hx of falls, rollator or funiture/wall walking in the home, rollator for community  POSTURE COMMENTS:  L sided hemiparesis   ACTIVITY TOLERANCE: Activity tolerance: Pt reports some dyspnea with prolonged standing/mobility  UPPER EXTREMITY ROM:  BUEs WFL  UPPER EXTREMITY MMT:     MMT Right eval Left eval Left 03/31/24 Left 05/13/24  Shoulder  flexion 5 4- 4- 4+  Shoulder abduction 5 4- 4- 4+  Shoulder adduction      Shoulder extension      Shoulder internal rotation   4- 4+  Shoulder external rotation   3+ 4  Middle trapezius      Lower trapezius      Elbow flexion 5 4- 4+ 5  Elbow extension 5 4- 4+ 5  Wrist flexion 5 4- 4+ 5  Wrist extension 5 4- 4 4+  Wrist ulnar deviation      Wrist radial deviation      Wrist pronation 5 4- 4+ 4+  Wrist supination 5 4- 4+ 4+  (Blank rows = not tested)  HAND FUNCTION: Eval: Grip strength: Right: 111 lbs; Left: 51 lbs, Lateral pinch: Right: 23 lbs, Left: 13 lbs, and 3 point pinch: Right: 26 lbs, Left: 13 lbs 03/31/24: Grip strength: Right: 101 lbs, Left: 31 lbs, lateral pinch: Right: 23 lbs, Left: 13 lbs, 3 point pinch: Right: 25 lbs, Left: 14 lbs  04/06/24: Grip strength: Left: 32 lbs 04/13/24: Grip strength: Left: 45 lbs  05/04/24: Grip strength: Left: 24 lb before stretching hand; 40 lbs after stretching; Lateral pinch: Left: 15 lbs, 3 point pinch: Left: 14 lbs  05/13/24: Grip strength: Left: 44 lb; Lateral pinch: Left: 16  lbs, 3 point pinch: Left: 13 lbs   COORDINATION: Finger Nose Finger test: increased time and decreased accuracy on the L  9 Hole Peg test: Right: 25 sec; Left: 1 min 27 sec 03/31/24: L 52 sec, 1 min 4 sec, 39 sec  05/13/24: 43 sec   Finger knee to nose 5x: R: 5 sec, L: 6 sec, mild dysmetria  SENSATION: Pt reports increased tingling/numbness when hand balls up a little, but when he straightens it out it goes back to normal.   EDEMA: no visible edema   MUSCLE TONE: LUE: Mild   COGNITION: Overall cognitive status: Within functional limits for tasks assessed pt reports difficulties with memory   VISION:             Subjective report: Wears glasses all the time.  Taking new medication for inflammation of bilat conjuctiva (prescribed prior to CVA)  VISION ASSESSMENT: Tracking/Visual pursuits: Able to track stimulus in all quads without difficulty Saccades:  additional head turns occurred during testing Visual Fields: no apparent deficits  PERCEPTION: Not tested  PRAXIS: Impaired: Motor planning; mild-moderate apraxia and ataxia throughout the LUE  OBSERVATIONS:  Pt pleasant, cooperative, and appears eager to work towards OT goals.                                                                                                             TREATMENT DATE: 05/20/24 Therapeutic Exercise: -L grip strengthening: hand gripper set at 11.2# to remove jumbo pegs from pegboard.  Pegboard placed on incline to increase challenge. -L wrist/forearm strengthening: 4# dumbbell to complete forearm pron/sup and wrist ext x3 sets 10 reps of each  Therapeutic Activity: -Facilitated LUE lateral and 3 point pinch strengthening and increasing precision of GMC working to place therapy resistant clips at a small target (tip of dowel).  Dowel position alternated to challenge forward and lateral reaching patterns with LUE unsupported from table top.   -1# wrist weight donned to L wrist to target L shoulder strengthening while placing/removing clips as noted above. -Facilitated quick/alternating movements and pinch strengthening, working to [place] clothespins from a horizontal dowel while alternating between pron/sup.    PATIENT EDUCATION: Education details: LUE strengthening/coordination training  Person educated: Patient Education method: Explanation and Verbal cues Education comprehension: verbalized understanding, demonstrated understanding, further training needed  HOME EXERCISE PROGRAM: Green theraputty 2-3x per day for 5-10 min periods for L hand grip strengthening, [facility] handout Tendon glides, passive stretching to the L wrist/digits, L hand AROM  GOALS: Goals reviewed with patient? Yes  SHORT TERM GOALS: Target date: 03/30/24  Pt will be indep to perform HEP for increasing strength and coordination throughout the LUE. Baseline: Eval: Not yet initiated;  03/31/24: Indep with HEP and verbalizes understanding of progressions made today for frequency Goal status: achieved  LONG TERM GOALS: Target date: 08/05/24  Pt will increase LUE MMT by 1/2 grade or more to increase engagement of LUE into ADL/IADLs. Baseline: Eval: LUE grossly 4-/5 throughout (R 5/5); 03/31/24: L shoulder grossly 4-, elbow flex 4+, ext 4,  forearm pron/sup 4+; 05/13/24: L shoulder 4 to 4+/5, elbow 5/5, wrist 4+ to 5/5 Goal status: achieved  2.  Pt will increase L grip strength by 20 lbs or more to enable pt to grasp and carry heavy ADL supplies in L hand. Baseline: Eval: L 51 lbs (R 111 lbs); 03/31/24: L grip 31 lbs (R 101 lbs); 05/13/24: L grip 44 lbs (edema in L hand has caused fluctuations in hand strength since eval) Goal status: in progress  3.  Pt will increase L hand [facility] skills as demonstrated by completion of 9 hole peg test in 30 sec or less to improve manipulation of small ADL supplies. Baseline: Eval: L 1 min 27 sec (R 25 sec); 03/31/24: 3 trials completed today: L 52 sec, 1 min 4 sec, 39 sec; 05/13/24: L 43 sec (R 25 sec) Goal status: Revised on 05/13/24 d/t improvement  4.  Pt will increase LUE GMC to enable confidence with moving hot pots/pans on/off stove top and in/out of the oven using BUEs. Baseline: Eval: Pt reports that his friend currently assists with heavier meal prep; 03/31/24: Pt is now cooking independently, but with increased time/caution; 05/13/24: LUE GMC has greatly improved, though pt still demos mild dysmetria and must use caution when lifting/carrying items noted above Goal status: in progress  5. Pt will increase tolerance for engaging the LUE into sustained pushing/grasping manuevers to enable push mowing yard in <45 min.  Baseline: Recert 05/13/24: Pt reports he prefers family to mow his lawn as it takes him increased time, estimating 45-60 min (brother mows in  30 min.    Goal status: New  6.  Pt will efficiently pick up flat items from table top  using L hand.  Baseline: Recert 05/13/24: Pt requires extra time/repeat trials with attempts at picking up coins, flat stones, paper from table top when using  L hand.  Goal status: New  ASSESSMENT:  CLINICAL IMPRESSION: Pt reports that he continues to routinely stretch his L hand digits to reduce tightness in the hand.  Pt able to complete 3 trials with hand gripper today at 11.2#, dropping only a few within the 3rd trial d/t fatigue.  Pt continues to present with mild-moderate ataxia in the LUE, often requiring repeated trials to hit a small target (clipping clothespin onto tip of dowel when arm is unsupported).  Pt demonstrates moderate ataxia when challenged to hit a target with a supinated forearm position (see above).  Pt will continue to benefit from skilled OT to work towards above noted goals for improving indep with daily tasks, return to work, reduce burden of care on caregivers, and improve QOL.    PERFORMANCE DEFICITS: in functional skills including ADLs, IADLs, coordination, dexterity, sensation, tone, strength, Fine motor control, Gross motor control, mobility, balance, body mechanics, endurance, decreased knowledge of precautions, decreased knowledge of use of DME, vision, and UE functional use, cognitive skills including memory, and psychosocial skills including coping strategies, environmental adaptation, and routines and behaviors.   IMPAIRMENTS: are limiting patient from ADLs, IADLs, work, and leisure.   CO-MORBIDITIES: has co-morbidities such as tobacco use disorder, HTN, arthritis that affects occupational performance. Patient will benefit from skilled OT to address above impairments and improve overall function.  MODIFICATION OR ASSISTANCE TO COMPLETE EVALUATION: No modification of tasks or assist necessary to complete an evaluation.  OT OCCUPATIONAL PROFILE AND HISTORY: Detailed assessment: Review of records and additional review of physical, cognitive, psychosocial history  related to current functional performance.  CLINICAL DECISION  MAKING: Moderate - several treatment options, min-mod task modification necessary  REHAB POTENTIAL: Good  EVALUATION COMPLEXITY: Moderate  PLAN:  OT FREQUENCY: 2x/week  OT DURATION: 12 weeks  PLANNED INTERVENTIONS: 97168 OT Re-evaluation, 97535 self care/ADL training, 02889 therapeutic exercise, 97530 therapeutic activity, 97112 neuromuscular re-education, 97140 manual therapy, 97116 gait training, 02989 moist heat, 97010 cryotherapy, 97750 Physical Performance Testing, passive range of motion, balance training, functional mobility training, visual/perceptual remediation/compensation, psychosocial skills training, energy conservation, coping strategies training, patient/family education, and DME and/or AE instructions  RECOMMENDED OTHER SERVICES: SLP eval and treat d/t pt reporting memory deficits since CVA, drooling, and slurred speech  CONSULTED AND AGREED WITH PLAN OF CARE: Patient  PLAN FOR NEXT SESSION: see above   Inocente Blazing, MS, OTR/L   05/21/2024, 4:22 PM

## 2024-05-20 NOTE — Telephone Encounter (Unsigned)
 Copied from CRM #[id number]. Topic: General - Other >> May 20, 2024 10:40 AM Tiffini S wrote: Reason for CRM: [name] with [employer] [phone] extension [phone] called about questions for disability paperwork- wants to discuss why the patient is on work restrictions- asked to be transferred directly to the clinic.  Called [employer], front desk rep answered- please call [name] with update.

## 2024-05-21 ENCOUNTER — Telehealth: Payer: Self-pay

## 2024-05-21 NOTE — Telephone Encounter (Signed)
 Copied from CRM #[id number]. Topic: General - Other >> May 20, 2024 10:40 AM Tiffini S wrote: Reason for CRM: [name] with [employer] [phone] extension [phone] called about questions for disability paperwork- wants to discuss why the patient is on work restrictions- asked to be transferred directly to the clinic.  Called [employer], front desk rep answered- please call [name] with update. >> May 21, 2024  1:59 PM Antwanette L wrote: The patient is calling because [employer] requires documentation indicating the anticipated return-to-work date and any applicable medical restrictions.Please have Curtis Boom fax over the requested information as soon as possible

## 2024-05-24 NOTE — Telephone Encounter (Signed)
 Called representative, [name], left voicemail.

## 2024-05-24 NOTE — Telephone Encounter (Signed)
 Called provided number for [facility], and left voicemail.

## 2024-05-24 NOTE — Telephone Encounter (Signed)
 Patient is calling to follow up on Disability paperwork- Was this received today from [employer] [phone] extension [phone] faxed paperwork today. Paperwork was faxed for the patient to return back to work 07/17/24 with  limitations.  Disability company needs more information on what the limitations.patient returns Patient has not received disability payment 04/16/24. CB- [phone]

## 2024-05-25 ENCOUNTER — Ambulatory Visit

## 2024-05-25 ENCOUNTER — Encounter: Admitting: Speech Pathology

## 2024-05-25 DIAGNOSIS — I639 Cerebral infarction, unspecified: Secondary | ICD-10-CM

## 2024-05-25 DIAGNOSIS — M6281 Muscle weakness (generalized): Secondary | ICD-10-CM

## 2024-05-25 DIAGNOSIS — R278 Other lack of coordination: Secondary | ICD-10-CM

## 2024-05-25 NOTE — Telephone Encounter (Signed)
 Florence Shuck with [employer] NA/Left voicemail message to call us  back to find out more information on what is needed per provider Curtis Boom.

## 2024-05-25 NOTE — Therapy (Unsigned)
 OUTPATIENT OCCUPATIONAL THERAPY NEURO TREATMENT NOTE  Patient Name: Mark Mcintosh MRN: [id number] DOB:10-28-1970, 53 y.o., male Today's Date: 05/25/2024  PCP: No PCP REFERRING PROVIDER: [name], PA-C  END OF SESSION:   OT End of Session - 05/25/24 1450     Visit Number 20    Number of Visits 41    Date for Recertification  08/05/24    Progress Note Due on Visit 20    OT Start Time 1448    OT Stop Time 1530    OT Time Calculation (min) 42 min    Activity Tolerance Patient tolerated treatment well    Behavior During Therapy [facility] for tasks assessed/performed         Past Medical History:  Diagnosis Date   Acute CVA (cerebrovascular accident) (HCC) 01/09/2024   Arthritis    Hypertension    Stroke (HCC) 01/09/2024   Stated he had 2 strokes.   Past Surgical History:  Procedure Laterality Date   BREAST BIOPSY Right 03/15/2024   US  RT BREAST BX W LOC DEV 1ST LESION IMG BX SPEC US  GUIDE 03/15/2024 [facility]   TEE WITHOUT CARDIOVERSION N/A 01/14/2024   Procedure: ECHOCARDIOGRAM, TRANSESOPHAGEAL;  Surgeon: Alluri, Keller BROCKS, MD;  Location: [facility] ORS;  Service: Cardiovascular;  Laterality: N/A;   Patient Active Problem List   Diagnosis Date Noted   Hemiparesis affecting left side as late effect of cerebrovascular accident (HCC) 04/01/2024   Mass of lower outer quadrant of right breast 02/27/2024   History of cerebrovascular accident (CVA) with residual deficit 02/27/2024   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) 02/27/2024   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) 02/27/2024   Gait abnormality 02/27/2024   Left-sided weakness 02/27/2024   Prediabetes 02/27/2024   Abnormal pulse oximetry 02/27/2024   Vitamin D  deficiency 02/27/2024   Bradycardia 02/27/2024   Depression, major, single episode, moderate (HCC) 02/27/2024   Coping style affecting medical condition 01/20/2024   Tobacco use disorder 01/09/2024    Obesity (BMI 30-39.9) 01/09/2024   Hypertension    ONSET DATE: 01/09/24  REFERRING DIAG: I63.9 (ICD-10-CM) - Acute CVA (cerebrovascular accident) (HCC)   THERAPY DIAG:  Muscle weakness (generalized)  Other lack of coordination  Acute CVA (cerebrovascular accident) (HCC)  Rationale for Evaluation and Treatment: Rehabilitation  SUBJECTIVE:  SUBJECTIVE STATEMENT: Pt reports feeling a little more emotional since his stroke.   Pt accompanied by: self  PERTINENT HISTORY: Per chart:  Rollyn Scialdone is a 53 y.o. right-handed male with history significant for bilateral conjunctival inflammation hypertension as well as tobacco use and class II morbid obesity with BMI 36.26.  Per chart review patient lives alone independent prior to admission.  Presented to [facility]/09/2024 with acute onset of left-sided weakness.  MRI showed acute infarct in the posterior limb of the right internal capsule, the overlying right frontal white matter and pons.  Multiple remote infarcts and chronic microvascular ischemic disease.  CTA showed no large vessel occlusion.  Admission chemistries unremarkable except potassium 3.1.  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  TEE with small PFO per cardiology services with no plan for closure.  Neurology follow-up placed on aspirin  and Plavix  for CVA prophylaxis x 3 weeks then aspirin  alone.  Lovenox  for DVT prophylaxis but bilateral Doppler studies negative.  Therapy evaluations completed due to patient decreased functional mobility left-sided weakness was admitted for a comprehensive rehab program.   PRECAUTIONS: Fall  WEIGHT BEARING RESTRICTIONS: No  PAIN: 05/25/24: no pain in  foot with otc pain meds and walking boot Are you having pain? 0/10  FALLS: Has patient fallen in last 6 months? Yes. 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  Get back my independence.    OBJECTIVE:  Note: Objective measures were completed at Evaluation unless otherwise noted.  HAND DOMINANCE: Right  ADLs: Overall ADLs: Son was staying with pt for about a week after d/c from inpatient rehab, but has since returned home.  Friend comes by every other day to check in.   Transfers/ambulation related to ADLs: modified indep with rollator Eating: increased difficulty with cutting food  Grooming: increased difficulty with clipping nails, otherwise manages fine with dominant/unaffected hand UB Dressing: increased time with clothing fasteners LB Dressing: increased time with clothing fasteners  Toileting: increased time to engage L hand into clothing management Bathing: pt sits on shower chair to wash LEs, some SOB with standing in shower Tub Shower transfers: Modified indep Equipment: Shower seat without back  IADLs: Shopping: motorized cart, can push cart for shorter shopping trips.  Light housekeeping: extra time and cautions to avoid falls, sometimes feels SOB with laundry  Meal Prep: friend helps with stove top cooking, pt can do light hot and cold meal prep  Community mobility: rollator, friend drove pt  Medication management: indep  Landscape architect: indep  Handwriting: NT; L non-dominant hand affected   MOBILITY STATUS: Hx of falls, rollator or funiture/wall walking in the home, rollator for community  POSTURE COMMENTS:  L sided hemiparesis   ACTIVITY TOLERANCE: Activity tolerance: Pt reports some dyspnea with prolonged standing/mobility  UPPER EXTREMITY ROM:  BUEs WFL  UPPER EXTREMITY MMT:     MMT Right eval Left eval Left 03/31/24 Left 05/13/24  Shoulder flexion 5 4- 4- 4+   Shoulder abduction 5 4- 4- 4+  Shoulder adduction      Shoulder extension      Shoulder internal rotation   4- 4+  Shoulder external rotation   3+ 4  Middle trapezius      Lower trapezius      Elbow flexion 5 4- 4+ 5  Elbow extension 5 4- 4+ 5  Wrist flexion 5 4- 4+ 5  Wrist extension 5 4- 4 4+  Wrist ulnar deviation      Wrist radial deviation      Wrist pronation 5 4- 4+ 4+  Wrist supination 5 4- 4+ 4+  (Blank rows = not tested)  HAND FUNCTION: Eval: Grip strength: Right: 111 lbs; Left: 51 lbs, Lateral pinch: Right: 23 lbs, Left: 13 lbs, and 3 point pinch: Right: 26 lbs, Left: 13 lbs 03/31/24: Grip strength: Right: 101 lbs, Left: 31 lbs, lateral pinch: Right: 23 lbs, Left: 13 lbs, 3 point pinch: Right: 25 lbs, Left: 14 lbs  04/06/24: Grip strength: Left: 32 lbs 04/13/24: Grip strength: Left: 45 lbs  05/04/24: Grip strength: Left: 24 lbs before stretching hand; 40 lbs after stretching; Lateral pinch: Left: 15 lbs, 3 point pinch: Left: 14 lbs  05/13/24: Grip strength: Left: 44 lbs; Lateral pinch: Left: 16 lbs, 3 point  pinch: Left: 13 lbs  05/25/24: Grip strength: Left: 44 lbs, Lateral pinch: Left: 16 lbs, 3 point pinch: Left: 11 lbs (measured on [place]; previously measured on standard)  COORDINATION: Finger Nose Finger test: increased time and decreased accuracy on the L  9 Hole Peg test: Right: 25 sec; Left: 1 min 27 sec 03/31/24: L 52 sec, 1 min 4 sec, 39 sec  05/13/24: 43 sec  05/25/24: 36 sec (after multiple trials)   05/13/24: Finger knee to nose 5x: R: 5 sec, L: 6 sec, mild dysmetria 05/25/24: L 6 sec, mild dysmetria   SENSATION: Pt reports increased tingling/numbness when hand balls up a little, but when he straightens it out it goes back to normal.   EDEMA: no visible edema   MUSCLE TONE: LUE: Mild   COGNITION: Overall cognitive status: Within functional limits for tasks assessed pt reports difficulties with memory   VISION:             Subjective report: Wears glasses  all the time.  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                                                                                                             TREATMENT DATE: 05/25/24 Therapeutic Exercise: -L grip strengthening: hand gripper set at 11.2# to remove jumbo pegs from pegboard.  Pegboard placed on incline to increase challenge. -L wrist/forearm strengthening: 4# dumbbell to complete forearm pron/sup and wrist ext x3 sets 10 reps of each  Therapeutic Activity: -Facilitated LUE lateral and 3 point pinch strengthening and increasing precision of GMC working to place therapy resistant clips at a small target (tip of dowel).  Dowel position alternated to challenge forward and lateral reaching patterns with LUE unsupported from table top.   -1# wrist weight donned to L wrist to target L shoulder strengthening while placing/removing clips as noted above. -Facilitated quick/alternating movements and pinch strengthening, working to [place] clothespins from a horizontal dowel while alternating between pron/sup.    PATIENT EDUCATION: Education details: LUE strengthening/coordination training  Person educated: Patient Education method: Explanation and Verbal cues Education comprehension: verbalized understanding, demonstrated understanding, further training needed  HOME EXERCISE PROGRAM: Green theraputty 2-3x per day for 5-10 min periods for L hand grip strengthening, [facility] handout Tendon glides, passive stretching to the L wrist/digits, L hand  AROM  GOALS: Goals reviewed with patient? Yes  SHORT TERM GOALS: Target date: 03/30/24  Pt will be indep to perform HEP for increasing strength and coordination throughout the LUE. Baseline: Eval: Not yet initiated; 03/31/24: Indep with HEP and verbalizes understanding of progressions made today for frequency Goal status: achieved  LONG TERM GOALS: Target date: 08/05/24  Pt will increase LUE MMT by 1/2 grade or more to increase engagement of LUE into ADL/IADLs. Baseline: Eval: LUE grossly 4-/5 throughout (R 5/5); 03/31/24: L shoulder grossly 4-, elbow flex 4+, ext 4, forearm pron/sup 4+; 05/13/24: L shoulder 4 to 4+/5, elbow 5/5, wrist 4+ to 5/5 Goal status: achieved  2.  Pt will increase L grip strength by 20 lbs or more to enable pt to grasp and carry heavy ADL supplies in L hand. Baseline: Eval: L 51 lbs (R 111 lbs); 03/31/24: L grip 31 lbs (R 101 lbs); 05/13/24: L grip 44 lbs (edema in L hand has caused fluctuations in hand strength since eval) Goal status: in progress  3.  Pt will increase L hand [facility] skills as demonstrated by completion of 9 hole peg test in 30 sec or less to improve manipulation of small ADL supplies. Baseline: Eval: L 1 min 27 sec (R 25 sec); 03/31/24: 3 trials completed today: L 52 sec, 1 min 4 sec, 39 sec; 05/13/24: L 43 sec (R 25 sec) Goal status: Revised on 05/13/24 d/t improvement  4.  Pt will increase LUE GMC to enable confidence with moving hot pots/pans on/off stove top and in/out of the oven using BUEs. Baseline: Eval: Pt reports that his friend currently assists with heavier meal prep; 03/31/24: Pt is now cooking independently, but with increased time/caution; 05/13/24: LUE GMC has greatly improved, though pt still demos mild dysmetria and must use caution when lifting/carrying items noted above Goal status: in progress  5. Pt will increase tolerance for engaging the LUE into sustained pushing/grasping manuevers to enable push mowing yard in <45 min.  Baseline:  Recert 05/13/24: Pt reports he prefers family to mow his lawn as it takes him increased time, estimating 45-60 min (brother mows in  30 min.    Goal status: New  6.  Pt will efficiently pick up flat items from table top using L hand.  Baseline: Recert 05/13/24: Pt requires extra time/repeat trials with attempts at picking up coins, flat stones, paper from table top when using  L hand.  Goal status: New  ASSESSMENT:  CLINICAL IMPRESSION: Pt reports that he continues to routinely stretch his L hand digits to reduce tightness in the hand.  Pt able to complete 3 trials with hand gripper today at 11.2#, dropping only a few within the 3rd trial d/t fatigue.  Pt continues to present with mild-moderate ataxia in the LUE, often requiring repeated trials to hit a small target (clipping clothespin onto tip of dowel when arm is unsupported).  Pt demonstrates moderate ataxia when challenged to hit a target with a supinated forearm position (see above).  Pt will continue to benefit from skilled OT to work towards above noted goals for improving indep with daily tasks, return to work, reduce burden of care on caregivers, and improve QOL.    PERFORMANCE DEFICITS: in functional skills including ADLs, IADLs, coordination, dexterity, sensation, tone, strength, Fine motor control, Gross motor control, mobility, balance, body mechanics, endurance, decreased knowledge of precautions, decreased knowledge of use of DME, vision, and UE functional use, cognitive skills including memory, and psychosocial skills including coping strategies, environmental adaptation, and routines and behaviors.   IMPAIRMENTS: are limiting patient from ADLs, IADLs, work, and leisure.   CO-MORBIDITIES: has co-morbidities such as tobacco use disorder, HTN, arthritis that affects occupational performance. Patient will benefit from skilled OT to address above impairments and improve overall function.  MODIFICATION OR ASSISTANCE TO COMPLETE  EVALUATION: No modification of  tasks or assist necessary to complete an evaluation.  OT OCCUPATIONAL PROFILE AND HISTORY: Detailed assessment: Review of records and additional review of physical, cognitive, psychosocial history related to current functional performance.  CLINICAL DECISION MAKING: Moderate - several treatment options, min-mod task modification necessary  REHAB POTENTIAL: Good  EVALUATION COMPLEXITY: Moderate  PLAN:  OT FREQUENCY: 2x/week  OT DURATION: 12 weeks  PLANNED INTERVENTIONS: 97168 OT Re-evaluation, 97535 self care/ADL training, 02889 therapeutic exercise, 97530 therapeutic activity, 97112 neuromuscular re-education, 97140 manual therapy, 97116 gait training, 02989 moist heat, 97010 cryotherapy, 97750 Physical Performance Testing, passive range of motion, balance training, functional mobility training, visual/perceptual remediation/compensation, psychosocial skills training, energy conservation, coping strategies training, patient/family education, and DME and/or AE instructions  RECOMMENDED OTHER SERVICES: SLP eval and treat d/t pt reporting memory deficits since CVA, drooling, and slurred speech  CONSULTED AND AGREED WITH PLAN OF CARE: Patient  PLAN FOR NEXT SESSION: see above   Inocente Blazing, MS, OTR/L   05/25/2024, 2:51 PM

## 2024-05-25 NOTE — Telephone Encounter (Signed)
 Pt called in to get an update and request that this documentation be filled out and sent back to [employer].  Pt would also like to know his disability restrictions to provide to employer.  Please follow up with pt before end of day. Pt states he is having his bills pile up on him with the delay of this paperwork  Callback [phone]

## 2024-05-25 NOTE — Telephone Encounter (Unsigned)
 Copied from CRM #[id number]. Topic: General - Other >> May 20, 2024 10:40 AM Tiffini S wrote: Reason for CRM: [name] with [employer] [phone] extension [phone] called about questions for disability paperwork- wants to discuss why the patient is on work restrictions- asked to be transferred directly to the clinic.  Called [employer], front desk rep answered- please call [name] with update. >> May 25, 2024 10:22 AM Joesph NOVAK wrote: Patient calling to get a update on his disability paperwork.  >> May 21, 2024  1:59 PM Antwanette L wrote: The patient is calling because [employer] requires documentation indicating the anticipated return-to-work date and any applicable medical restrictions.Please have Curtis Boom fax over the requested information as soon as possible

## 2024-05-26 ENCOUNTER — Telehealth: Payer: Self-pay

## 2024-05-26 NOTE — Telephone Encounter (Signed)
 Attempted to call [facility] back, left detailed voicemail.

## 2024-05-26 NOTE — Telephone Encounter (Signed)
 Called Mary from [employer], no answer when called. Lvm to return call regarding pt disability forms and what information is missing or needing regarding this.

## 2024-05-26 NOTE — Telephone Encounter (Signed)
 Copied from CRM #[id number]. Topic: General - Other >> May 26, 2024  8:35 AM Rosaria BRAVO wrote: Reason for CRM: Called for update on disability paperwork, spoke with [employer]. Advised   Pt reports that he is incurring late fees because he has not received his payments for disability.   He is requesting a call back today from the clinic.   Says his rent is passed due and he really needs this taken care of.   Best contact: [phone]

## 2024-05-26 NOTE — Telephone Encounter (Signed)
 Ive spoke with Mark Mcintosh. Please review other pt call.

## 2024-05-26 NOTE — Telephone Encounter (Signed)
 Spoke with [employer] Case manager [phone] ext. [phone]. [name] is stating OV 8/29 and 9/03 does not tell her much. [name] is wanting to know from [place] what the exam finding that is keeping pt from being on Disability and what is his to return to work plan. She advise to call her back and leave message on her voice mail box answering the questions and if she has more questions for me she would call me back.

## 2024-05-27 ENCOUNTER — Telehealth: Payer: Self-pay

## 2024-05-27 ENCOUNTER — Encounter: Admitting: Speech Pathology

## 2024-05-27 ENCOUNTER — Ambulatory Visit: Admitting: Physical Therapy

## 2024-05-27 ENCOUNTER — Encounter: Payer: Self-pay | Admitting: Family Medicine

## 2024-05-27 ENCOUNTER — Ambulatory Visit

## 2024-05-27 DIAGNOSIS — R278 Other lack of coordination: Secondary | ICD-10-CM

## 2024-05-27 DIAGNOSIS — R262 Difficulty in walking, not elsewhere classified: Secondary | ICD-10-CM

## 2024-05-27 DIAGNOSIS — M6281 Muscle weakness (generalized): Secondary | ICD-10-CM

## 2024-05-27 DIAGNOSIS — R2689 Other abnormalities of gait and mobility: Secondary | ICD-10-CM

## 2024-05-27 DIAGNOSIS — R2681 Unsteadiness on feet: Secondary | ICD-10-CM

## 2024-05-27 DIAGNOSIS — I639 Cerebral infarction, unspecified: Secondary | ICD-10-CM

## 2024-05-27 NOTE — Telephone Encounter (Unsigned)
 Copied from CRM #[id number]. Topic: General - Other >> May 26, 2024  3:43 PM Willma R wrote: Reason for CRM: Patient is calling again for an update on disability paperwork, advised of the note stating a call was placed to [facility] but she didn't answer and left VM. Patient is requesting to try again to reach [facility] as he needs this completed today. Says cannot pay his bills and is being charged late fees while he has to wait for this to be completed to get his disability payment.   Patient can be reached at [phone] >> May 27, 2024  8:51 AM Emylou G wrote: Patient called: Last visit was 8/29 - His doctor said at that time..  said he can come back to work on 11/23 with limitations... Can patient have copy of those notes for his job?  He would like to stop to get it on 13th/15th.  Pls call patient when ready. OR if at all possible he can get it today - he has rehab at [facility].

## 2024-05-27 NOTE — Therapy (Signed)
 OUTPATIENT PHYSICAL THERAPY TREATMENT  Patient Name: Mark Mcintosh MRN: [id number] DOB:[birth year], 53 y.o., male Today's Date: 05/27/2024  PCP: None REFERRING PROVIDER: [name], PA-C   PT End of Session - 05/27/24 1445     Visit Number 17    Number of Visits 31    Date for Recertification  07/08/24    Progress Note Due on Visit 20    PT Start Time 1450    PT Stop Time 1530    PT Time Calculation (min) 40 min    Equipment Utilized During Treatment Gait belt    Activity Tolerance Patient tolerated treatment well;No increased pain    Behavior During Therapy [facility] for tasks assessed/performed              Past Medical History:  Diagnosis Date   Acute CVA (cerebrovascular accident) (HCC) 01/09/2024   Arthritis    Hypertension    Stroke (HCC) 01/09/2024   Stated he had 2 strokes.   Past Surgical History:  Procedure Laterality Date   BREAST BIOPSY Right 03/15/2024   US  RT BREAST BX W LOC DEV 1ST LESION IMG BX SPEC US  GUIDE 03/15/2024 [facility]   TEE WITHOUT CARDIOVERSION N/A 01/14/2024   Procedure: ECHOCARDIOGRAM, TRANSESOPHAGEAL;  Surgeon: Alluri, Keller BROCKS, MD;  Location: [facility] ORS;  Service: Cardiovascular;  Laterality: N/A;   Patient Active Problem List   Diagnosis Date Noted   Hemiparesis affecting left side as late effect of cerebrovascular accident (HCC) 04/01/2024   Mass of lower outer quadrant of right breast 02/27/2024   History of cerebrovascular accident (CVA) with residual deficit 02/27/2024   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) 02/27/2024   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) 02/27/2024   Gait abnormality 02/27/2024   Left-sided weakness 02/27/2024   Prediabetes 02/27/2024   Abnormal pulse oximetry 02/27/2024   Vitamin D  deficiency 02/27/2024   Bradycardia 02/27/2024   Depression, major, single episode, moderate (HCC) 02/27/2024   Coping style affecting medical condition  01/20/2024   Tobacco use disorder 01/09/2024   Obesity (BMI 30-39.9) 01/09/2024   Hypertension     ONSET DATE: 01/09/24  REFERRING DIAG: I63.9 (ICD-10-CM) - Acute CVA (cerebrovascular accident) (HCC)   THERAPY DIAG:  Muscle weakness (generalized)  Other lack of coordination  Acute CVA (cerebrovascular accident) (HCC)  Difficulty in walking, not elsewhere classified  Unsteadiness on feet  Other abnormalities of gait and mobility  Rationale for Evaluation and Treatment: Rehabilitation  SUBJECTIVE:  SUBJECTIVE STATEMENT:  Pt reports doing okay reports no pain in the L foot on this day. Was seen by orthopedics yesterday. Cleared to ambulate without boot per pt report.     PERTINENT HISTORY: [facility] ED 01/09/24 for progressively worsening L sided-weakness. multiple acute infarcts concerning for embolic showering. PMH includes arthritis, HTN. 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   PAIN:  Are you having pain? No  PRECAUTIONS: None  WEIGHT BEARING RESTRICTIONS: No  FALLS: Has patient fallen in last 6 months? Yes. Number of falls 1- pt reports that he tried to get up at home after returning from [place] and he got up too fast and fell. Pt also reports stumbling on steps at home  LIVING ENVIRONMENT: Lives with: lives alone- pt's son was staying with him until this past Friday- son lives in [place], pt has friends that come by every other day Lives in: House/apartment Stairs: Yes: External: 4 steps; can reach both Has following equipment at home: Walker - 4 wheeled and shower chair  PLOF: Independent- pt works with mixing chemicals and dyes  PATIENT GOALS: Pt wants to get back to normal and be able to return to  work  OBJECTIVE:  Note: Objective measures were completed at Evaluation unless otherwise noted.  DIAGNOSTIC FINDINGS: via chart  From 01/09/24 MRI Brain W/O Contrast: IMPRESSION: 1. Acute infarcts in the posterior limb of the right internal capsule, the overlying right frontal white matter, and pons. Given involvement of multiple vascular territories, consider an embolic etiology. 2. Multiple remote infarcts and chronic microvascular ischemic disease, detailed above.  From 01/10/24 CT Angio Head Neck W W/O CM: IMPRESSION: 1. No acute intracranial hemorrhage or mass effect. 2. No large vessel occlusion, hemodynamically significant stenosis, or aneurysm in the head or neck. 3. Remote lacunar infarcts in the genu of the left internal capsule, globus pallidus, and right corona radiata. 4. Acute infarct involving the posterior limb of the right internal capsule is less well appreciated at CT. 5. Moderately advanced periventricular white matter disease for age.   MMT:  MMT Right Eval Left Eval  Hip flexion 4- 3+  Hip extension      Hip abduction 5 5  Hip adduction 4+ 4+  Hip internal rotation      Hip external rotation      Knee flexion 5 4+  Knee extension 5 4+  Ankle dorsiflexion 5 4-  Ankle plantarflexion 5 4-  Ankle inversion      Ankle eversion      (Blank rows = not tested)  FUNCTIONAL TESTS (at eval):  5 times sit-to-stand: 17.9 seconds, pt utilizing arms on knees to assist with standing : 0.56 m/s with 4WW and CGA; 0.73 m/s with no AD and CGA BERG Balance Scale: 33/56 FGA: 18/30 on 02/25/24 : 795 ft with 4WW on 02/25/24  PATIENT SURVEYS (eval):  Stroke Impact Scale: 45/80  TREATMENT DATE: 05/27/24  Gait without Ue support x 477ft. No pain reported in the LLE.   Narrow stance eyes open eyes closed 20 sec each   Semitandem eyes  open/eyes closed, 15 sec each SLS on the RLE x 15 sec. Reports mild discomfort when attempting on the L side.  Elevated stance with 1 foot on 12 inch step 2 x 15 bil.   Ball tap with PVC pipe:  Seated x 20  Standing normal BOS x 20 Standing semi tandem x 15 bil  Standing with 1 foot on 4inch step x 15 bil   Lateral ball catch and toss with trunk rotation to neutral between toss.x 12 bil    Walking while tossing ball to eye level or above 24ft x 4   Sit<>stand with UE supported on thighs 2 x 15   Forward/reverse gait without UE support 37ft x 6   PT provided CGA to supervision assist throughout session unless otherwise noted throughout session. No additional reports of foot pain except in attempted SLS.    PATIENT EDUCATION: Education details:  No longer requires [place].   Pt has made remarkable toward ADL based goals, however a massive gap remains between activities he has done here in clinic and what he will need to do to prepare for return to work duties.  Person educated: Patient Education method: Explanation, Demonstration, and Handouts Education comprehension: verbalized understanding and returned demonstration  HOME EXERCISE PROGRAM: Access Code: [email or web address]  URL: [email or web address]  Date: 02/17/2024  Prepared by: Darryle Patten  Exercises: - Seated March  - 1 x daily - 3 x weekly - 3 sets - 10 reps  - Standing March with Counter Support  - 1 x daily - 3 x weekly - 3 sets - 10 reps  - Mini Squat with Counter Support  - 1 x daily - 3 x weekly - 3 sets - 10 reps  - Heel Toe Raises with Counter Support  - 1 x daily - 3 x weekly - 3 sets - 10 reps   GOALS: Goals reviewed with patient? Yes  SHORT TERM GOALS: Target date: 06/04/24  Patient will be independent in home exercise program to improve strength/mobility for better functional independence with ADL. Baseline: HEP reviewed during eval Goal status: ACHIEVED  LONG TERM GOALS: Target date:  07/08/24  Patient will increase their Stroke Impact Scale score by >10 points for improved function and independence Baseline: 45/80 8/13: 70/80 Goal status: MET   2.  Patient (< 3 years old) will complete five times sit to stand test in < 10 seconds indicating an increased LE strength and improved balance. Baseline: 17.9 seconds with UE support; 03/29/24: 11.7 hands free Goal status: progress made, goal not met   3.  Patient will increase 10 meter walk test to >0.8 m/s as to improve gait speed for better community ambulation and to reduce fall risk. Baseline: 0.56 m/s with 5TT & 0.73 m/s with no AD ; 8/12: 1.30m/s  Goal status: ACHIEVED  4.  Patient will increase Berg Balance score by > 6 points to demonstrate decreased fall risk during functional activities. Baseline: 33/56; 03/30/24: 54/56 Goal status: ACHIEVED,    5.  Patient will increase Functional Gait Assessment score to >20/30 as to reduce fall risk and improve dynamic gait safety with community ambulation. Baseline:18/30 on 02/25/24; 03/30/24: 27/30 Goal status: ACHIEVED  6.  Patient will increase six minute walk test distance to > 1540ft ft for progression to limited community Ambulator and improved  independence with access to community and occupation  Baseline: 755ft with 4WW on 02/25/24; 03/11/24: 977ft c SPC(MET >80ft improvement)  Goal status: MET: adjusted. NEW.   7. Pt will increase SLS to > 20 sec bil to improve safety with ADLs and IADLs including showering, shopping, and work related tasks.   Baseline: 8/13: 4 sec on the R, 3 sec on the L   Goal status NEW.   ASSESSMENT:  CLINICAL IMPRESSION:   Pt cleared to ambulate without cam boot.  PT treatment performed on level surface with BLE support to limit chance pain in the RLE. No complaints of pain except in attempted SLS. Tolerated all interventions well with no overt LOB. Will continue to slowly increase loading to the RLE. Pt will benefit from continued therapy in  order to address his impairments, and improve his ability to return to work-related activities, and to improve his overall quality of life.  OBJECTIVE IMPAIRMENTS: Abnormal gait, decreased activity tolerance, decreased balance, decreased coordination, decreased endurance, decreased mobility, difficulty walking, decreased ROM, decreased strength, hypomobility, impaired perceived functional ability, impaired flexibility, impaired UE functional use, improper body mechanics, and postural dysfunction.   ACTIVITY LIMITATIONS: carrying, lifting, bending, sitting, standing, squatting, stairs, transfers, bathing, reach over head, hygiene/grooming, and locomotion level  PARTICIPATION LIMITATIONS: meal prep, cleaning, laundry, driving, shopping, community activity, occupation, and yard work  PERSONAL FACTORS: Age and Fitness are also affecting patient's functional outcome.   REHAB POTENTIAL: Good  CLINICAL DECISION MAKING: Evolving/moderate complexity  EVALUATION COMPLEXITY: Moderate  PLAN:  PT FREQUENCY: 2x/week  PT DURATION: 12 weeks  PLANNED INTERVENTIONS: 97164- PT Re-evaluation, 97750- Physical Performance Testing, 97110-Therapeutic exercises, 97530- Therapeutic activity, [id number]- Neuromuscular re-education, 97535- Self Care, [id number]- Manual therapy, [id number]- Gait training, [phone]- Orthotic Initial, [phone]- Orthotic/Prosthetic subsequent, [phone]- Canalith repositioning, [phone]- Splinting, [phone]- Electrical stimulation (manual), [id number]- Ultrasound, [id number]- Traction (mechanical), 20560 (1-2 muscles), 20561 (3+ muscles)- Dry Needling, Patient/Family education, Balance training, Stair training, Taping, Joint mobilization, Spinal mobilization, Vestibular training, Visual/preceptual remediation/compensation, Cognitive remediation, DME instructions, Cryotherapy, and Moist heat  PLAN FOR NEXT SESSION:   - continue working on job related duties to improve stamina and lifting+mobility/balance abilities - SLS as  tolerated given recent foot fx   Massie FORBES Dollar PT  Physical Therapist- [employer]  [facility]   2:51 PM 05/27/24

## 2024-05-27 NOTE — Telephone Encounter (Signed)
Attempted to call and no answer.

## 2024-05-27 NOTE — Progress Notes (Signed)
 Spoke with Mark Mcintosh regarding on going [employer], Short Term Disability - pt states he received notice documentation he has extension of short term disability until 07/11/24.   Pt understandable of Bfp attempts to call and communicate with [facility] doctor.

## 2024-05-27 NOTE — Telephone Encounter (Signed)
 Attempted call to [facility] again today at 4:40pm - she was out of office. Left VM to return call

## 2024-05-27 NOTE — Telephone Encounter (Signed)
 Copied from CRM #[id number]. Topic: General - Other >> May 26, 2024  3:43 PM Willma R wrote: Reason for CRM: Patient is calling again for an update on disability paperwork, advised of the note stating a call was placed to [facility] but she didn't answer and left VM. Patient is requesting to try again to reach [facility] as he needs this completed today. Says cannot pay his bills and is being charged late fees while he has to wait for this to be completed to get his disability payment.   Patient can be reached at [phone]

## 2024-05-27 NOTE — Telephone Encounter (Unsigned)
 Copied from CRM [id number]. Topic: General - Other >> May 27, 2024  4:28 PM Tobias L wrote: Reason for CRM: Suma with [place]  [employer] calling for status on questionnaire faxed to office on 05/25/2024.   Requesting callback: [phone] ext [phone]  Incident Number: [id number]

## 2024-05-27 NOTE — Telephone Encounter (Signed)
 Spoke with pt and advised provider has put out several calls to [facility] unable to get in touch with her and awaiting for a call back for pt. Pt stated he would call mary to see what is going on.

## 2024-05-28 NOTE — Therapy (Signed)
 OUTPATIENT OCCUPATIONAL THERAPY NEURO TREATMENT NOTE  Patient Name: Mark Mcintosh MRN: [id number] DOB:Dec 25, 1970, 53 y.o., male Today's Date: 05/28/2024  PCP: No PCP REFERRING PROVIDER: [name], PA-C  END OF SESSION:   OT End of Session - 05/28/24 1041     Visit Number 21    Number of Visits 41    Date for Recertification  08/05/24    Progress Note Due on Visit 30    OT Start Time 1530    OT Stop Time 1615    OT Time Calculation (min) 45 min    Activity Tolerance Patient tolerated treatment well    Behavior During Therapy [facility] for tasks assessed/performed         Past Medical History:  Diagnosis Date   Acute CVA (cerebrovascular accident) (HCC) 01/09/2024   Arthritis    Hypertension    Stroke (HCC) 01/09/2024   Stated he had 2 strokes.   Past Surgical History:  Procedure Laterality Date   BREAST BIOPSY Right 03/15/2024   US  RT BREAST BX W LOC DEV 1ST LESION IMG BX SPEC US  GUIDE 03/15/2024 [facility]   TEE WITHOUT CARDIOVERSION N/A 01/14/2024   Procedure: ECHOCARDIOGRAM, TRANSESOPHAGEAL;  Surgeon: Alluri, Mark BROCKS, MD;  Location: [facility] ORS;  Service: Cardiovascular;  Laterality: N/A;   Patient Active Problem List   Diagnosis Date Noted   Hemiparesis affecting left side as late effect of cerebrovascular accident (HCC) 04/01/2024   Mass of lower outer quadrant of right breast 02/27/2024   History of cerebrovascular accident (CVA) with residual deficit 02/27/2024   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) 02/27/2024   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) 02/27/2024   Gait abnormality 02/27/2024   Left-sided weakness 02/27/2024   Prediabetes 02/27/2024   Abnormal pulse oximetry 02/27/2024   Vitamin D  deficiency 02/27/2024   Bradycardia 02/27/2024   Depression, major, single episode, moderate (HCC) 02/27/2024   Coping style affecting medical condition 01/20/2024   Tobacco use disorder 01/09/2024    Obesity (BMI 30-39.9) 01/09/2024   Hypertension    ONSET DATE: 01/09/24  REFERRING DIAG: I63.9 (ICD-10-CM) - Acute CVA (cerebrovascular accident) (HCC)   THERAPY DIAG:  Muscle weakness (generalized)  Other lack of coordination  Acute CVA (cerebrovascular accident) (HCC)  Rationale for Evaluation and Treatment: Rehabilitation  SUBJECTIVE:  SUBJECTIVE STATEMENT: Pt reports that he had a good follow up visit with the orthopedist this week and no longer requires the walking boot as his metatarsal fx has healed.   Pt accompanied by: self  PERTINENT HISTORY: Per chart:  [name] is a 53 y.o. right-handed male with history significant for bilateral conjunctival inflammation hypertension as well as tobacco use and class II morbid obesity with BMI 36.26.  Per chart review patient lives alone independent prior to admission.  [facility]ed to [facility]09[facility] with acute onset of left-sided weakness.  MRI showed acute infarct in the posterior limb of the right internal capsule, the overlying right frontal white matter and pons.  Multiple remote infarcts and chronic microvascular ischemic disease.  CTA showed no large vessel occlusion.  Admission chemistries unremarkable except potassium 3.1.  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  TEE with small PFO per cardiology services with no plan for closure.  Neurology follow-up placed on aspirin  and Plavix  for CVA prophylaxis x 3 weeks then aspirin  alone.  Lovenox  for DVT prophylaxis but bilateral Doppler studies negative.  Therapy evaluations completed due to patient decreased functional mobility left-sided weakness was admitted for a  comprehensive rehab program.   PRECAUTIONS: Fall  WEIGHT BEARING RESTRICTIONS: No  PAIN: 05/27/24: no pain in foot with otc pain meds prn. Are you having pain? 0/10  FALLS: Has patient fallen in last 6 months? Yes. 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  Get back my independence.    OBJECTIVE:  Note: Objective measures were completed at Evaluation unless otherwise noted.  HAND DOMINANCE: Right  ADLs: Overall ADLs: Son was staying with pt for about a week after d/c from inpatient rehab, but has since returned home.  Friend comes by every other day to check in.   Transfers/ambulation related to ADLs: modified indep with rollator Eating: increased difficulty with cutting food  Grooming: increased difficulty with clipping nails, otherwise manages fine with dominant/unaffected hand UB Dressing: increased time with clothing fasteners LB Dressing: increased time with clothing fasteners  Toileting: increased time to engage L hand into clothing management Bathing: pt sits on shower chair to wash LEs, some SOB with standing in shower Tub Shower transfers: Modified indep Equipment: Shower seat without back  IADLs: Shopping: motorized cart, can push cart for shorter shopping trips.  Light housekeeping: extra time and cautions to avoid falls, sometimes feels SOB with laundry  Meal Prep: friend helps with stove top cooking, pt can do light hot and cold meal prep  Community mobility: rollator, friend drove pt  Medication management: indep  Landscape architect: indep  Handwriting: NT; L non-dominant hand affected   MOBILITY STATUS: Hx of falls, rollator or funiture/wall walking in the home, rollator for community  POSTURE COMMENTS:  L sided hemiparesis   ACTIVITY TOLERANCE: Activity tolerance: Pt reports some dyspnea with prolonged standing/mobility  UPPER EXTREMITY ROM:  BUEs WFL  UPPER EXTREMITY MMT:     MMT  Right eval Left eval Left 03/31/24 Left 05/13/24  Shoulder flexion 5 4- 4- 4+  Shoulder abduction 5 4- 4- 4+  Shoulder adduction      Shoulder extension      Shoulder internal rotation   4- 4+  Shoulder external rotation   3+ 4  Middle trapezius      Lower trapezius      Elbow flexion 5 4- 4+ 5  Elbow extension 5 4- 4+ 5  Wrist flexion 5 4- 4+ 5  Wrist extension 5 4- 4 4+  Wrist ulnar deviation      Wrist radial deviation      Wrist pronation 5 4- 4+ 4+  Wrist supination 5 4- 4+ 4+  (Blank rows = not tested)  HAND FUNCTION: Eval: Grip strength: Right: 111 lbs; Left: 51 lbs, Lateral pinch: Right: 23 lbs, Left: 13 lbs, and 3 point pinch: Right: 26 lbs, Left: 13 lbs 03/31/24: Grip strength: Right: 101 lbs, Left: 31 lbs, lateral pinch: Right: 23 lbs, Left: 13 lbs, 3 point pinch: Right: 25 lbs, Left: 14 lbs  04/06/24: Grip strength: Left: 32 lbs 04/13/24: Grip strength: Left: 45 lbs  05/04/24: Grip strength: Left: 24 lbs before stretching hand; 40 lbs after stretching; Lateral pinch: Left: 15 lbs, 3 point pinch: Left:  14 lbs  05/13/24: Grip strength: Left: 44 lbs; Lateral pinch: Left: 16 lbs, 3 point pinch: Left: 13 lbs  05/25/24: Grip strength: Left: 44 lbs, Lateral pinch: Left: 16 lbs, 3 point pinch: Left: 11 lbs (measured on [place]; previously measured on standard)  COORDINATION: Finger Nose Finger test: increased time and decreased accuracy on the L  9 Hole Peg test: Right: 25 sec; Left: 1 min 27 sec 03/31/24: L 52 sec, 1 min 4 sec, 39 sec  05/13/24: 43 sec  05/25/24: 36 sec (after multiple trials)   05/13/24: Finger knee to nose 5x: R: 5 sec, L: 6 sec, mild dysmetria 05/25/24: L 6 sec, mild dysmetria   SENSATION: Pt reports increased tingling/numbness when hand balls up a little, but when he straightens it out it goes back to normal.   EDEMA: no visible edema   MUSCLE TONE: LUE: Mild   COGNITION: Overall cognitive status: Within functional limits for tasks assessed pt reports  difficulties with memory   VISION:             Subjective report: Wears glasses all the time.  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                                                                                                             TREATMENT DATE: 05/27/24 Therapeutic Exercise: -L grip strengthening: Hand gripper set at moderate resistance with 3 red bands: 4 sets 15 reps.  Rest between sets and min vc for performing self passive wrist and digit stretch between sets.   Neuro re-ed: -Facilitated L hand FMC/dexterity skills working to pick up flat objects from table top (various size coins, poker chips), store up to 1-5 in palm, then practiced translatory skills to move objects from palm to fingertips 1 by 1. -Facilitated LUE GMC skills reaching toward a target: pt placed poker chips and coins into slotted container; container held in various positions to challenge forward and lateral reaching patterns at shoulder level (no UE support).  Further increased challenge with pt reaching at this level with a supinated forearm.   Therapeutic Activity: Dual tasking: -Practiced transporting light items (coin/coins, tissue, paper) in L hand while ambulating, conversing, and manipulating items in R hand, working to prevent dropped items from L hand.   PATIENT EDUCATION: Education details: Dual tasking activities   Person educated: Patient Education method: Explanation and Verbal cues Education comprehension: verbalized understanding  HOME EXERCISE  PROGRAM: Green theraputty 2-3x per day for 5-10 min periods for L hand grip strengthening, [facility] handout Tendon glides, passive stretching to the L wrist/digits, L hand AROM  GOALS: Goals reviewed with patient? Yes  SHORT TERM GOALS: Target  date: 03/30/24  Pt will be indep to perform HEP for increasing strength and coordination throughout the LUE. Baseline: Eval: Not yet initiated; 03/31/24: Indep with HEP and verbalizes understanding of progressions made today for frequency Goal status: achieved  LONG TERM GOALS: Target date: 08/05/24  Pt will increase LUE MMT by 1/2 grade or more to increase engagement of LUE into ADL/IADLs. Baseline: Eval: LUE grossly 4-/5 throughout (R 5/5); 03/31/24: L shoulder grossly 4-, elbow flex 4+, ext 4, forearm pron/sup 4+; 05/13/24: L shoulder 4 to 4+/5, elbow 5/5, wrist 4+ to 5/5 Goal status: achieved  2.  Pt will increase L grip strength by 20 lbs or more to enable pt to grasp and carry heavy ADL supplies in L hand. Baseline: Eval: L 51 lbs (R 111 lbs); 03/31/24: L grip 31 lbs (R 101 lbs); 05/13/24: L grip 44 lbs (edema in L hand has caused fluctuations in hand strength since eval); 05/25/24: L grip 44 lbs Goal status: in progress  3.  Pt will increase L hand [facility] skills as demonstrated by completion of 9 hole peg test in 30 sec or less to improve manipulation of small ADL supplies. Baseline: Eval: L 1 min 27 sec (R 25 sec); 03/31/24: 3 trials completed today: L 52 sec, 1 min 4 sec, 39 sec; 05/13/24: L 43 sec (R 25 sec); 05/25/24: L 36 sec (after several trials) Goal status: in progress  4.  Pt will increase LUE GMC to enable confidence with moving hot pots/pans on/off stove top and in/out of the oven using BUEs. Baseline: Eval: Pt reports that his friend currently assists with heavier meal prep; 03/31/24: Pt is now cooking independently, but with increased time/caution; 05/13/24: LUE GMC has greatly improved, though pt still demos mild dysmetria and must use caution  when lifting/carrying items noted above; 05/25/24: mild dysmetria/continues to use caution Goal status: in progress  5. Pt will increase tolerance for engaging the LUE into sustained pushing/grasping manuevers to enable push mowing yard in <45 min.  Baseline: Recert 05/13/24: Pt reports he prefers family to mow his lawn as it takes him increased time, estimating 45-60 min (brother mows in  30 min; 05/25/24: Limited mowing since L foot metatarsal fx  Goal status: in progress   6.  Pt will efficiently pick up flat items from table top using L hand.  Baseline: Recert 05/13/24: Pt requires extra time/repeat trials with attempts at picking up coins, flat stones, paper from table top when using  L hand; 05/25/24: extra time for above (see 05/13/24)  Goal status: in progress  ASSESSMENT:  CLINICAL IMPRESSION: Pt with good tolerance to LUE FMC/GMC activities and strengthening activities this date.  Reaching precision is decreased with a supinated forearm, though this is improving.  Pt continues to frequently drop small items in L hand when moving items from palm to fingertips, especially when attempting to perform with speed or while dual tasking.  Pt reports that though he is improving, pt still occasionally drops items from L hand during item transport, especially with lighter items (tissue/paper) if he's not focussed on his hand.  Pt did well with dual tasking today without dropping items, but will continue to challenge this skill to observe/achieve consistency.  Pt will continue to benefit from skilled OT to work towards above noted goals for improving indep with daily tasks, return to work, reduce burden of care on caregivers, and improve QOL.    PERFORMANCE DEFICITS: in functional skills including ADLs, IADLs, coordination, dexterity, sensation, tone,  strength, Fine motor control, Gross motor control, mobility, balance, body mechanics, endurance, decreased knowledge of precautions, decreased knowledge of  use of DME, vision, and UE functional use, cognitive skills including memory, and psychosocial skills including coping strategies, environmental adaptation, and routines and behaviors.   IMPAIRMENTS: are limiting patient from ADLs, IADLs, work, and leisure.   CO-MORBIDITIES: has co-morbidities such as tobacco use disorder, HTN, arthritis that affects occupational performance. Patient will benefit from skilled OT to address above impairments and improve overall function.  MODIFICATION OR ASSISTANCE TO COMPLETE EVALUATION: No modification of tasks or assist necessary to complete an evaluation.  OT OCCUPATIONAL PROFILE AND HISTORY: Detailed assessment: Review of records and additional review of physical, cognitive, psychosocial history related to current functional performance.  CLINICAL DECISION MAKING: Moderate - several treatment options, min-mod task modification necessary  REHAB POTENTIAL: Good  EVALUATION COMPLEXITY: Moderate  PLAN:  OT FREQUENCY: 2x/week  OT DURATION: 12 weeks  PLANNED INTERVENTIONS: 97168 OT Re-evaluation, 97535 self care/ADL training, 02889 therapeutic exercise, 97530 therapeutic activity, 97112 neuromuscular re-education, 97140 manual therapy, 97116 gait training, 02989 moist heat, 97010 cryotherapy, 97750 Physical Performance Testing, passive range of motion, balance training, functional mobility training, visual/perceptual remediation/compensation, psychosocial skills training, energy conservation, coping strategies training, patient/family education, and DME and/or AE instructions  RECOMMENDED OTHER SERVICES: SLP eval and treat d/t pt reporting memory deficits since CVA, drooling, and slurred speech  CONSULTED AND AGREED WITH PLAN OF CARE: Patient  PLAN FOR NEXT SESSION: see above   Inocente Blazing, MS, OTR/L   05/28/2024, 10:43 AM

## 2024-05-28 NOTE — Telephone Encounter (Signed)
 Per curtis, she spoke with pt and it was able to get disability extended. As of now we are not sure if there is anything else that is missing.

## 2024-05-31 NOTE — Telephone Encounter (Unsigned)
 Copied from CRM [id number]. Topic: General - Other >> May 31, 2024  2:50 PM Antony RAMAN wrote: Reason for CRM: [place]  life representative calling about pcp completing information that they have faxed in, resending the fax to [phone]

## 2024-06-01 ENCOUNTER — Ambulatory Visit

## 2024-06-01 ENCOUNTER — Encounter: Admitting: Speech Pathology

## 2024-06-01 DIAGNOSIS — M6281 Muscle weakness (generalized): Secondary | ICD-10-CM

## 2024-06-01 DIAGNOSIS — R2689 Other abnormalities of gait and mobility: Secondary | ICD-10-CM

## 2024-06-01 DIAGNOSIS — R278 Other lack of coordination: Secondary | ICD-10-CM

## 2024-06-01 DIAGNOSIS — R262 Difficulty in walking, not elsewhere classified: Secondary | ICD-10-CM

## 2024-06-01 DIAGNOSIS — R2681 Unsteadiness on feet: Secondary | ICD-10-CM

## 2024-06-01 DIAGNOSIS — I639 Cerebral infarction, unspecified: Secondary | ICD-10-CM

## 2024-06-01 NOTE — Therapy (Signed)
 OUTPATIENT PHYSICAL THERAPY TREATMENT  Patient Name: Mark Mcintosh MRN: [id number] DOB:1971-01-01, 53 y.o., male Today's Date: 06/02/2024  PCP: None REFERRING PROVIDER: [name], PA-C   PT End of Session - 06/01/24 1408     Visit Number 18    Number of Visits 31    Date for Recertification  07/08/24    Progress Note Due on Visit 20    PT Start Time 1406    PT Stop Time 1445    PT Time Calculation (min) 39 min    Equipment Utilized During Treatment Gait belt    Activity Tolerance Patient tolerated treatment well;No increased pain    Behavior During Therapy [facility] for tasks assessed/performed               Past Medical History:  Diagnosis Date   Acute CVA (cerebrovascular accident) (HCC) 01/09/2024   Arthritis    Hypertension    Stroke (HCC) 01/09/2024   Stated he had 2 strokes.   Past Surgical History:  Procedure Laterality Date   BREAST BIOPSY Right 03/15/2024   US  RT BREAST BX W LOC DEV 1ST LESION IMG BX SPEC US  GUIDE 03/15/2024 [facility]   TEE WITHOUT CARDIOVERSION N/A 01/14/2024   Procedure: ECHOCARDIOGRAM, TRANSESOPHAGEAL;  Surgeon: Alluri, Keller BROCKS, MD;  Location: [facility] ORS;  Service: Cardiovascular;  Laterality: N/A;   Patient Active Problem List   Diagnosis Date Noted   Hemiparesis affecting left side as late effect of cerebrovascular accident (HCC) 04/01/2024   Mass of lower outer quadrant of right breast 02/27/2024   History of cerebrovascular accident (CVA) with residual deficit 02/27/2024   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) 02/27/2024   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) 02/27/2024   Gait abnormality 02/27/2024   Left-sided weakness 02/27/2024   Prediabetes 02/27/2024   Abnormal pulse oximetry 02/27/2024   Vitamin D  deficiency 02/27/2024   Bradycardia 02/27/2024   Depression, major, single episode, moderate (HCC) 02/27/2024   Coping style affecting medical condition  01/20/2024   Tobacco use disorder 01/09/2024   Obesity (BMI 30-39.9) 01/09/2024   Hypertension     ONSET DATE: 01/09/24  REFERRING DIAG: I63.9 (ICD-10-CM) - Acute CVA (cerebrovascular accident) (HCC)   THERAPY DIAG:  Muscle weakness (generalized)  Other lack of coordination  Acute CVA (cerebrovascular accident) (HCC)  Difficulty in walking, not elsewhere classified  Unsteadiness on feet  Other abnormalities of gait and mobility  Rationale for Evaluation and Treatment: Rehabilitation  SUBJECTIVE:  SUBJECTIVE STATEMENT:  Patient continues to report doing well without pain in foot and states no falls since last visit.       PERTINENT HISTORY: [facility] ED 01/09/24 for progressively worsening L sided-weakness. multiple acute infarcts concerning for embolic showering. PMH includes arthritis, HTN. 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   PAIN:  Are you having pain? No  PRECAUTIONS: None  WEIGHT BEARING RESTRICTIONS: No  FALLS: Has patient fallen in last 6 months? Yes. Number of falls 1- pt reports that he tried to get up at home after returning from [facility] and he got up too fast and fell. 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  DIAGNOSTIC FINDINGS: via chart  From 01/09/24 MRI Brain W/O Contrast: IMPRESSION: 1. Acute infarcts in the posterior limb of the right internal capsule, the overlying right frontal white matter, and pons. Given involvement of multiple vascular territories, consider an embolic etiology. 2. Multiple remote infarcts and chronic microvascular ischemic disease, detailed above.  From 01/10/24 CT Angio Head Neck W W/O CM: IMPRESSION: 1. No acute intracranial hemorrhage or mass effect. 2. No large vessel occlusion, hemodynamically significant stenosis, or aneurysm in the head or neck. 3. Remote lacunar infarcts in the genu of the left internal capsule, globus pallidus, and right corona radiata. 4. Acute infarct involving the posterior limb of the right internal capsule is less well appreciated at CT. 5. Moderately advanced periventricular white matter disease for age.   MMT:  MMT Right Eval Left Eval  Hip flexion 4- 3+  Hip extension      Hip abduction 5 5  Hip adduction 4+ 4+  Hip internal rotation      Hip external rotation      Knee flexion 5 4+  Knee extension 5 4+  Ankle dorsiflexion 5 4-  Ankle plantarflexion 5 4-  Ankle inversion      Ankle eversion      (Blank rows = not tested)  FUNCTIONAL TESTS (at eval):  5 times sit-to-stand: 17.9 seconds, pt utilizing arms on knees to assist with standing : 0.56 m/s with 4WW and CGA; 0.73 m/s with no AD and CGA BERG Balance Scale: 33/56 FGA: 18/30 on 02/25/24 : 795 ft with 4WW on 02/25/24  PATIENT SURVEYS (eval):  Stroke Impact Scale: 45/80  TREATMENT DATE: 06/01/24   NMR:  Dynamic high knee march at support bar (hold 2 sec) x 20 reps each.   3/4 to almost full tandem eyes open 30 sec each LE x 3 each side SLS on the RLE x 15 sec. Reports mild discomfort  when attempting on the L side.  Near tandem standing with self ball toss at wall - x 20 reps then switch feet and repeat 20 reps   Forward/reverse gait without UE support 41ft x 6  Forward step up/over obstacles in open gym floor without UE support - 1 orange hurdle then 1/2 foam roll then another 1/2 foam roll then another orange hurdle x 10  Side stepping up/over obstacles in open gym floor without UE support - 1 orange hurdle then 1/2 foam roll then another 1/2 foam roll then another orange hurdle x 10  TA:  Squat down to pick up 15# KB with each Hand - walk 10 feet and sit the KB's down then turn around and repeat back and forth x 10   Sit<>stand with UE across chest 2 x 15.   Deadlift 15#KB's each side x 12 reps    PT provided CGA to supervision assist throughout session unless otherwise noted throughout session.     PATIENT EDUCATION: Education details:  No longer requires [place].   Pt has made remarkable toward ADL based goals, however a massive gap remains between activities he has done here in clinic and what he will need to do to prepare for return to work duties.  Person educated: Patient Education method: Explanation, Demonstration, and Handouts Education comprehension: verbalized understanding and returned demonstration  HOME EXERCISE PROGRAM: Access Code: [email or web address]  URL: [email or web address]  Date: 02/17/2024  Prepared by: Darryle Patten  Exercises: - Seated March  - 1 x daily - 3 x weekly - 3 sets - 10 reps  - Standing March with Counter Support  - 1 x daily - 3 x weekly - 3 sets - 10 reps  - Mini Squat with Counter Support  - 1 x daily - 3 x weekly - 3 sets - 10 reps  - Heel Toe Raises with Counter Support  - 1 x daily - 3 x weekly - 3 sets - 10 reps   GOALS: Goals reviewed with patient? Yes  SHORT TERM GOALS: Target date: 06/04/24  Patient will be independent in home exercise program to improve strength/mobility for better functional  independence with ADL. Baseline: HEP reviewed during eval Goal status: ACHIEVED  LONG TERM GOALS: Target date: 07/08/24  Patient will increase their Stroke Impact Scale score by >10 points for improved function and independence Baseline: 45/80 8/13: 70/80 Goal status: MET   2.  Patient (< 74 years old) will complete five times sit to stand test in < 10 seconds indicating an increased LE strength and improved balance. Baseline: 17.9 seconds with UE support; 03/29/24: 11.7 hands free Goal status: progress made, goal not met   3.  Patient will increase 10 meter walk test to >0.8 m/s as to improve gait speed for better community ambulation and to reduce fall risk. Baseline: 0.56 m/s with 5TT & 0.73 m/s with no AD ; 8/12: 1.54m/s  Goal status: ACHIEVED  4.  Patient will increase Berg Balance score by > 6 points to demonstrate decreased fall risk during functional activities. Baseline: 33/56; 03/30/24: 54/56 Goal status: ACHIEVED,    5.  Patient will increase Functional Gait Assessment score to >20/30 as to reduce fall risk and  improve dynamic gait safety with community ambulation. Baseline:18/30 on 02/25/24; 03/30/24: 27/30 Goal status: ACHIEVED  6.  Patient will increase six minute walk test distance to > 1570ft ft for progression to limited community Ambulator and improved independence with access to community and occupation  Baseline: 758ft with 4WW on 02/25/24; 03/11/24: 963ft c SPC(MET >26ft improvement)  Goal status: MET: adjusted. NEW.   7. Pt will increase SLS to > 20 sec bil to improve safety with ADLs and IADLs including showering, shopping, and work related tasks.   Baseline: 8/13: 4 sec on the R, 3 sec on the L   Goal status NEW.   ASSESSMENT:  CLINICAL IMPRESSION:   Patient performed well with adding some dynamic functional activities today without report of increased Left foot pain. He was able weight shift well to left side and able to some resistive training and stepping  over obstacles without any significant difficulty.  Pt will benefit from continued therapy in order to address his impairments, and improve his ability to return to work-related activities, and to improve his overall quality of life.  OBJECTIVE IMPAIRMENTS: Abnormal gait, decreased activity tolerance, decreased balance, decreased coordination, decreased endurance, decreased mobility, difficulty walking, decreased ROM, decreased strength, hypomobility, impaired perceived functional ability, impaired flexibility, impaired UE functional use, improper body mechanics, and postural dysfunction.   ACTIVITY LIMITATIONS: carrying, lifting, bending, sitting, standing, squatting, stairs, transfers, bathing, reach over head, hygiene/grooming, and locomotion level  PARTICIPATION LIMITATIONS: meal prep, cleaning, laundry, driving, shopping, community activity, occupation, and yard work  PERSONAL FACTORS: Age and Fitness are also affecting patient's functional outcome.   REHAB POTENTIAL: Good  CLINICAL DECISION MAKING: Evolving/moderate complexity  EVALUATION COMPLEXITY: Moderate  PLAN:  PT FREQUENCY: 2x/week  PT DURATION: 12 weeks  PLANNED INTERVENTIONS: 97164- PT Re-evaluation, 97750- Physical Performance Testing, 97110-Therapeutic exercises, 97530- Therapeutic activity, [id number]- Neuromuscular re-education, 97535- Self Care, [id number]- Manual therapy, [id number]- Gait training, [phone]- Orthotic Initial, [phone]- Orthotic/Prosthetic subsequent, [phone]- Canalith repositioning, [phone]- Splinting, [phone]- Electrical stimulation (manual), [id number]- Ultrasound, [id number]- Traction (mechanical), 20560 (1-2 muscles), 20561 (3+ muscles)- Dry Needling, Patient/Family education, Balance training, Stair training, Taping, Joint mobilization, Spinal mobilization, Vestibular training, Visual/preceptual remediation/compensation, Cognitive remediation, DME instructions, Cryotherapy, and Moist heat  PLAN FOR NEXT SESSION:   - continue working  on job related duties to improve stamina and lifting+mobility/balance abilities - SLS as tolerated given recent foot fx   Reyes LOISE London PT  Physical Therapist- [facility]   9:21 PM 06/02/24

## 2024-06-03 ENCOUNTER — Encounter: Payer: Self-pay | Admitting: Family Medicine

## 2024-06-03 ENCOUNTER — Ambulatory Visit

## 2024-06-03 ENCOUNTER — Ambulatory Visit: Admitting: Physical Therapy

## 2024-06-03 ENCOUNTER — Encounter: Admitting: Speech Pathology

## 2024-06-03 DIAGNOSIS — I639 Cerebral infarction, unspecified: Secondary | ICD-10-CM

## 2024-06-03 DIAGNOSIS — R278 Other lack of coordination: Secondary | ICD-10-CM

## 2024-06-03 DIAGNOSIS — M6281 Muscle weakness (generalized): Secondary | ICD-10-CM | POA: Diagnosis not present

## 2024-06-03 DIAGNOSIS — R2681 Unsteadiness on feet: Secondary | ICD-10-CM

## 2024-06-03 DIAGNOSIS — R262 Difficulty in walking, not elsewhere classified: Secondary | ICD-10-CM

## 2024-06-03 DIAGNOSIS — R2689 Other abnormalities of gait and mobility: Secondary | ICD-10-CM

## 2024-06-03 NOTE — Therapy (Signed)
 OUTPATIENT PHYSICAL THERAPY TREATMENT  Patient Name: Mark Mcintosh MRN: [id number] DOB:Mar 28, 1971, 53 y.o., male Today's Date: 06/03/2024  PCP: None REFERRING PROVIDER: [name], PA-C   PT End of Session - 06/03/24 1450     Visit Number 19    Number of Visits 31    Date for Recertification  07/08/24    Progress Note Due on Visit 20    PT Start Time 1449    PT Stop Time 1530    PT Time Calculation (min) 41 min    Equipment Utilized During Treatment Gait belt    Activity Tolerance Patient tolerated treatment well;No increased pain    Behavior During Therapy [facility] for tasks assessed/performed               Past Medical History:  Diagnosis Date   Acute CVA (cerebrovascular accident) (HCC) 01/09/2024   Arthritis    Hypertension    Stroke (HCC) 01/09/2024   Stated he had 2 strokes.   Past Surgical History:  Procedure Laterality Date   BREAST BIOPSY Right 03/15/2024   US  RT BREAST BX W LOC DEV 1ST LESION IMG BX SPEC US  GUIDE 03/15/2024 [facility]   TEE WITHOUT CARDIOVERSION N/A 01/14/2024   Procedure: ECHOCARDIOGRAM, TRANSESOPHAGEAL;  Surgeon: Alluri, Keller BROCKS, MD;  Location: [facility] ORS;  Service: Cardiovascular;  Laterality: N/A;   Patient Active Problem List   Diagnosis Date Noted   Hemiparesis affecting left side as late effect of cerebrovascular accident (HCC) 04/01/2024   Mass of lower outer quadrant of right breast 02/27/2024   History of cerebrovascular accident (CVA) with residual deficit 02/27/2024   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) 02/27/2024   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) 02/27/2024   Gait abnormality 02/27/2024   Left-sided weakness 02/27/2024   Prediabetes 02/27/2024   Abnormal pulse oximetry 02/27/2024   Vitamin D  deficiency 02/27/2024   Bradycardia 02/27/2024   Depression, major, single episode, moderate (HCC) 02/27/2024   Coping style affecting medical condition  01/20/2024   Tobacco use disorder 01/09/2024   Obesity (BMI 30-39.9) 01/09/2024   Hypertension     ONSET DATE: 01/09/24  REFERRING DIAG: I63.9 (ICD-10-CM) - Acute CVA (cerebrovascular accident) (HCC)   THERAPY DIAG:  Muscle weakness (generalized)  Other lack of coordination  Acute CVA (cerebrovascular accident) (HCC)  Difficulty in walking, not elsewhere classified  Unsteadiness on feet  Other abnormalities of gait and mobility  Rationale for Evaluation and Treatment: Rehabilitation  SUBJECTIVE:  SUBJECTIVE STATEMENT:  Pt reports that he is doing well. No pain in hands or feet.  No reported falls or trips.    PERTINENT HISTORY: [facility] ED 01/09/24 for progressively worsening L sided-weakness. multiple acute infarcts concerning for embolic showering. PMH includes arthritis, HTN. 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   PAIN:  Are you having pain? No  PRECAUTIONS: None  WEIGHT BEARING RESTRICTIONS: No  FALLS: Has patient fallen in last 6 months? Yes. Number of falls 1- pt reports that he tried to get up at home after returning from [employer] and he got up too fast and fell. 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  DIAGNOSTIC FINDINGS: via chart  From 01/09/24 MRI Brain W/O Contrast: IMPRESSION: 1. Acute infarcts in the posterior limb of the right internal capsule, the overlying right frontal white matter, and pons. Given involvement of multiple vascular territories, consider an embolic etiology. 2. Multiple remote infarcts and chronic microvascular ischemic disease, detailed above.  From 01/10/24 CT Angio Head Neck W W/O CM: IMPRESSION: 1. No acute intracranial hemorrhage or mass effect. 2. No large vessel occlusion, hemodynamically significant stenosis, or aneurysm in the head or neck. 3. Remote lacunar infarcts in the genu of the left internal capsule, globus pallidus, and right corona radiata. 4. Acute infarct involving the posterior limb of the right internal capsule is less well appreciated at CT. 5. Moderately advanced periventricular white matter disease for age.   MMT:  MMT Right Eval Left Eval  Hip flexion 4- 3+  Hip extension      Hip abduction 5 5  Hip adduction 4+ 4+  Hip internal rotation      Hip external rotation      Knee flexion 5 4+  Knee extension 5 4+  Ankle dorsiflexion 5 4-  Ankle plantarflexion 5 4-  Ankle inversion      Ankle eversion      (Blank rows = not tested)  FUNCTIONAL TESTS (at eval):  5 times sit-to-stand: 17.9 seconds, pt utilizing arms on knees to assist with standing : 0.56 m/s with 4WW and CGA; 0.73 m/s with no AD and CGA BERG Balance Scale: 33/56 FGA: 18/30 on 02/25/24 : 795 ft with 4WW on 02/25/24  PATIENT SURVEYS (eval):  Stroke Impact Scale: 45/80  TREATMENT DATE: 06/01/24  Gait training:  On treadmill 4 min x 3 with 1 min rest break between bouts.  1: 0.18mph tactile cues for improved reciproca arm swing  2: 0.8 increased every 1.5 min to 1.59mph. Continued  instruction for improved reciprocal arm swning and full step length on the LLE  3: 0.9 to 1.2mph increased every 1 min. Emphasis on symmetry of step length with improved pelvic rotation on full step length on the LLE.   Gait training through [facility] department 2 x 24ft with min cues for posture and improved reciproce trunk and arm swing  Side stepping 44ft x 10 bil with min cues for  Posture  full step length/height to the R to prevent dragging LLE.   Reverse gait 51ft x 12 with min cues for symmetry of gait pattern throughout, mild improvement with increased repetitions.  PT provided CGA to supervision assist throughout session unless otherwise noted throughout session.     PATIENT EDUCATION: Education details:  No longer requires [place].   Pt has made remarkable toward ADL based goals, however a massive gap remains between activities he has done here in clinic and what he will need to do to prepare for return to work duties.  Person educated: Patient Education method: Explanation, Demonstration, and Handouts Education comprehension: verbalized understanding and returned demonstration  HOME EXERCISE PROGRAM: Access Code: [email or web address]  URL: [email or web address]  Date: 02/17/2024  Prepared by: Darryle Patten  Exercises: - Seated March  - 1 x daily - 3 x weekly - 3 sets - 10 reps  - Standing March with Counter Support  - 1 x daily - 3 x weekly - 3 sets - 10 reps  - Mini Squat with Counter Support  - 1 x daily - 3 x weekly - 3 sets - 10 reps  - Heel Toe Raises with Counter Support  - 1 x daily - 3 x weekly - 3 sets - 10 reps   GOALS: Goals reviewed with patient? Yes  SHORT TERM GOALS: Target date: 06/04/24  Patient will be independent in home exercise program to improve strength/mobility for better functional independence with ADL. Baseline: HEP reviewed during eval Goal status: ACHIEVED  LONG TERM GOALS: Target date: 07/08/24  Patient will increase their  Stroke Impact Scale score by >10 points for improved function and independence Baseline: 45/80 8/13: 70/80 Goal status: MET   2.  Patient (< 86 years old) will complete five times sit to stand test in < 10 seconds indicating an increased LE strength and improved balance. Baseline: 17.9 seconds with UE support; 03/29/24: 11.7 hands free Goal status: progress made, goal not met   3.  Patient will increase 10 meter walk test to >0.8 m/s as to improve gait speed for better community ambulation and to reduce fall risk. Baseline: 0.56 m/s with 5TT & 0.73 m/s with no AD ; 8/12: 1.15m/s  Goal status: ACHIEVED  4.  Patient will increase Berg Balance score by > 6 points to demonstrate decreased fall risk during functional activities. Baseline: 33/56; 03/30/24: 54/56 Goal status: ACHIEVED,    5.  Patient will increase Functional Gait Assessment score to >20/30 as to reduce fall risk and improve dynamic gait safety with community ambulation. Baseline:18/30 on 02/25/24; 03/30/24: 27/30 Goal status: ACHIEVED  6.  Patient will increase six minute walk test distance to > 1553ft ft for progression to limited community Ambulator and improved independence with access to community and occupation  Baseline: 779ft with 4WW on 02/25/24;  03/11/24: 925ft c SPC(MET >49ft improvement)  Goal status: MET: adjusted. NEW.   7. Pt will increase SLS to > 20 sec bil to improve safety with ADLs and IADLs including showering, shopping, and work related tasks.   Baseline: 8/13: 4 sec on the R, 3 sec on the L   Goal status NEW.   ASSESSMENT:  CLINICAL IMPRESSION:   Pt tolerated treatment well. PT treatment focused on increased high intensity gait training on treadmill and over ground with instruction for improved reciprocal motion. Pt tolerated well asd was able to increased step length and height on the LLE, but still demonstrates difficulty with reciprocal trunk and arm swing. Pt will benefit from continued therapy in order to  address his impairments, and improve his ability to return to work-related activities, and to improve his overall quality of life.  OBJECTIVE IMPAIRMENTS: Abnormal gait, decreased activity tolerance, decreased balance, decreased coordination, decreased endurance, decreased mobility, difficulty walking, decreased ROM, decreased strength, hypomobility, impaired perceived functional ability, impaired flexibility, impaired UE functional use, improper body mechanics, and postural dysfunction.   ACTIVITY LIMITATIONS: carrying, lifting, bending, sitting, standing, squatting, stairs, transfers, bathing, reach over head, hygiene/grooming, and locomotion level  PARTICIPATION LIMITATIONS: meal prep, cleaning, laundry, driving, shopping, community activity, occupation, and yard work  PERSONAL FACTORS: Age and Fitness are also affecting patient's functional outcome.   REHAB POTENTIAL: Good  CLINICAL DECISION MAKING: Evolving/moderate complexity  EVALUATION COMPLEXITY: Moderate  PLAN:  PT FREQUENCY: 2x/week  PT DURATION: 12 weeks  PLANNED INTERVENTIONS: 97164- PT Re-evaluation, 97750- Physical Performance Testing, 97110-Therapeutic exercises, 97530- Therapeutic activity, [id number]- Neuromuscular re-education, 97535- Self Care, 02859- Manual therapy, [id number]- Gait training, [id number]- Orthotic Initial, [id number]- Orthotic/Prosthetic subsequent, [phone]- Canalith repositioning, [id number]- Splinting, [id number]- Electrical stimulation (manual), [id number]- Ultrasound, [id number]- Traction (mechanical), [phone] (1-2 muscles), 20561 (3+ muscles)- Dry Needling, Patient/Family education, Balance training, Stair training, Taping, Joint mobilization, Spinal mobilization, Vestibular training, Visual/preceptual remediation/compensation, Cognitive remediation, DME instructions, Cryotherapy, and Moist heat  PLAN FOR NEXT SESSION:   Progress note.   Massie FORBES Dollar PT  Physical Therapist-   [facility]   2:51  PM 06/03/24

## 2024-06-03 NOTE — Therapy (Signed)
 OUTPATIENT OCCUPATIONAL THERAPY NEURO TREATMENT NOTE  Patient Name: Colon Rueth MRN: [id number] DOB:Jun 08, 1971, 53 y.o., male Today's Date: 06/03/2024  PCP: No PCP REFERRING PROVIDER: [name], PA-C  END OF SESSION:   OT End of Session - 06/03/24 0814     Visit Number 22    Number of Visits 41    Date for Recertification  08/05/24    Progress Note Due on Visit 30    OT Start Time 1445    OT Stop Time 1530    OT Time Calculation (min) 45 min    Activity Tolerance Patient tolerated treatment well    Behavior During Therapy [facility] for tasks assessed/performed         Past Medical History:  Diagnosis Date   Acute CVA (cerebrovascular accident) (HCC) 01/09/2024   Arthritis    Hypertension    Stroke (HCC) 01/09/2024   Stated he had 2 strokes.   Past Surgical History:  Procedure Laterality Date   BREAST BIOPSY Right 03/15/2024   US  RT BREAST BX W LOC DEV 1ST LESION IMG BX SPEC US  GUIDE 03/15/2024 [facility]   TEE WITHOUT CARDIOVERSION N/A 01/14/2024   Procedure: ECHOCARDIOGRAM, TRANSESOPHAGEAL;  Surgeon: Alluri, Keller BROCKS, MD;  Location: [facility] ORS;  Service: Cardiovascular;  Laterality: N/A;   Patient Active Problem List   Diagnosis Date Noted   Hemiparesis affecting left side as late effect of cerebrovascular accident (HCC) 04/01/2024   Mass of lower outer quadrant of right breast 02/27/2024   History of cerebrovascular accident (CVA) with residual deficit 02/27/2024   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) 02/27/2024   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) 02/27/2024   Gait abnormality 02/27/2024   Left-sided weakness 02/27/2024   Prediabetes 02/27/2024   Abnormal pulse oximetry 02/27/2024   Vitamin D  deficiency 02/27/2024   Bradycardia 02/27/2024   Depression, major, single episode, moderate (HCC) 02/27/2024   Coping style affecting medical condition 01/20/2024   Tobacco use disorder 01/09/2024    Obesity (BMI 30-39.9) 01/09/2024   Hypertension    ONSET DATE: 01/09/24  REFERRING DIAG: I63.9 (ICD-10-CM) - Acute CVA (cerebrovascular accident) (HCC)   THERAPY DIAG:  Muscle weakness (generalized)  Other lack of coordination  Acute CVA (cerebrovascular accident) (HCC)  Rationale for Evaluation and Treatment: Rehabilitation  SUBJECTIVE:  SUBJECTIVE STATEMENT: Pt reports improving tolerance/efficiency with mowing lawn.  Pt accompanied by: self  PERTINENT HISTORY: Per chart:  Henley Boettner is a 53 y.o. right-handed male with history significant for bilateral conjunctival inflammation hypertension as well as tobacco use and class II morbid obesity with BMI 36.26.  Per chart review patient lives alone independent prior to admission.  [facility]ed to [facility]09[facility] with acute onset of left-sided weakness.  MRI showed acute infarct in the posterior limb of the right internal capsule, the overlying right frontal white matter and pons.  Multiple remote infarcts and chronic microvascular ischemic disease.  CTA showed no large vessel occlusion.  Admission chemistries unremarkable except potassium 3.1.  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  TEE with small PFO per cardiology services with no plan for closure.  Neurology follow-up placed on aspirin  and Plavix  for CVA prophylaxis x 3 weeks then aspirin  alone.  Lovenox  for DVT prophylaxis but bilateral Doppler studies negative.  Therapy evaluations completed due to patient decreased functional mobility left-sided weakness was admitted for a comprehensive rehab program.   PRECAUTIONS: Fall  WEIGHT BEARING RESTRICTIONS: No  PAIN: 06/01/24: no pain Are you having pain? 0/10  FALLS: Has patient fallen in last 6 months? Yes. 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  Get back my independence.    OBJECTIVE:  Note: Objective measures were completed at Evaluation unless otherwise noted.  HAND DOMINANCE: Right  ADLs: Overall ADLs: Son was staying with pt for about a week after d/c from inpatient rehab, but has since returned home.  Friend comes by every other day to check in.   Transfers/ambulation related to ADLs: modified indep with rollator Eating: increased difficulty with cutting food  Grooming: increased difficulty with clipping nails, otherwise manages fine with dominant/unaffected hand UB Dressing: increased time with clothing fasteners LB Dressing: increased time with clothing fasteners  Toileting: increased time to engage L hand into clothing management Bathing: pt sits on shower chair to wash LEs, some SOB with standing in shower Tub Shower transfers: Modified indep Equipment: Shower seat without back  IADLs: Shopping: motorized cart, can push cart for shorter shopping trips.  Light housekeeping: extra time and cautions to avoid falls, sometimes feels SOB with laundry  Meal Prep: friend helps with stove top cooking, pt can do light hot and cold meal prep  Community mobility: rollator, friend drove pt  Medication management: indep  Landscape architect: indep  Handwriting: NT; L non-dominant hand affected   MOBILITY STATUS: Hx of falls, rollator or funiture/wall walking in the home, rollator for community  POSTURE COMMENTS:  L sided hemiparesis   ACTIVITY TOLERANCE: Activity tolerance: Pt reports some dyspnea with prolonged standing/mobility  UPPER EXTREMITY ROM:  BUEs WFL  UPPER EXTREMITY MMT:     MMT Right eval Left eval Left 03/31/24 Left 05/13/24  Shoulder flexion 5 4- 4- 4+  Shoulder abduction 5 4- 4- 4+   Shoulder adduction      Shoulder extension      Shoulder internal rotation   4- 4+  Shoulder external rotation   3+ 4  Middle trapezius      Lower trapezius      Elbow flexion 5 4- 4+ 5  Elbow extension 5 4- 4+ 5  Wrist flexion 5 4- 4+ 5  Wrist extension 5 4- 4 4+  Wrist ulnar deviation      Wrist radial deviation      Wrist pronation 5 4- 4+ 4+  Wrist supination 5 4- 4+ 4+  (Blank rows = not tested)  HAND FUNCTION: Eval: Grip strength: Right: 111 lbs; Left: 51 lbs, Lateral pinch: Right: 23 lbs, Left: 13 lbs, and 3 point pinch: Right: 26 lbs, Left: 13 lbs 03/31/24: Grip strength: Right: 101 lbs, Left: 31 lbs, lateral pinch: Right: 23 lbs, Left: 13 lbs, 3 point pinch: Right: 25 lbs, Left: 14 lbs  04/06/24: Grip strength: Left: 32 lbs 04/13/24: Grip strength: Left: 45 lbs  05/04/24: Grip strength: Left: 24 lbs before stretching hand; 40 lbs after stretching; Lateral pinch: Left: 15 lbs, 3 point pinch: Left: 14 lbs  05/13/24: Grip strength: Left: 44 lbs; Lateral pinch: Left: 16 lbs, 3 point pinch: Left: 13 lbs  05/25/24: Grip strength: Left: 44 lbs, Lateral pinch: Left:  16 lbs, 3 point pinch: Left: 11 lbs (measured on [place]; previously measured on standard)  COORDINATION: Finger Nose Finger test: increased time and decreased accuracy on the L  9 Hole Peg test: Right: 25 sec; Left: 1 min 27 sec 03/31/24: L 52 sec, 1 min 4 sec, 39 sec  05/13/24: 43 sec  05/25/24: 36 sec (after multiple trials)   05/13/24: Finger knee to nose 5x: R: 5 sec, L: 6 sec, mild dysmetria 05/25/24: L 6 sec, mild dysmetria   SENSATION: Pt reports increased tingling/numbness when hand balls up a little, but when he straightens it out it goes back to normal.   EDEMA: no visible edema   MUSCLE TONE: LUE: Mild   COGNITION: Overall cognitive status: Within functional limits for tasks assessed pt reports difficulties with memory   VISION:             Subjective report: Wears glasses all the time.  Taking new  medication for inflammation of bilat conjuctiva (prescribed prior to CVA)  VISION ASSESSMENT: Tracking/Visual pursuits: Able to track stimulus in all quads without difficulty Saccades: additional head turns occurred during testing Visual Fields: no apparent deficits  PERCEPTION: Not tested  PRAXIS: Impaired: Motor planning; mild-moderate apraxia and ataxia throughout the LUE  OBSERVATIONS:  Pt pleasant, cooperative, and appears eager to work towards OT goals.                                                                                                             TREATMENT DATE: 06/01/24 Therapeutic Exercise: -L grip strengthening: Hand gripper set at moderate resistance with 3 red bands: 3 sets 15 reps.  Rest between sets and min vc for performing self passive wrist and digit stretch between sets.   Neuro re-ed: -Facilitated L hand FMC/dexterity skills working to place grooved pegs into pegboard.  Pt practiced picking pegs up from table top, storing multiple in hand, and translatory movements moving pegs from palm to fingertips in prep for discarding. -Facilitated LUE GMC skills reaching toward a target: pt placed grooved pegs into upright pegboard without support of arm on tabletop.   Therapeutic Activity: -Facilitated dual tasking with conversation while pt participated in L hand pinch strengthening (lateral and 3 point pinching) and GMC while placing clips on vertical and horizontal dowels.  Pt also practiced quick/alternating movements with pron/sup to move clips on/off a horizontal dowel without LUE supported on table top.  PATIENT EDUCATION: Education details: LUE GMC/FMC activities  Person educated: Patient Education method: Verbal cues Education comprehension: verbalized understanding  HOME EXERCISE PROGRAM: Green theraputty 2-3x per day for 5-10 min periods for L hand grip strengthening, [facility] handout Tendon glides, passive stretching to the L wrist/digits, L hand  AR[facility]  GOALS: Goals reviewed with patient? Yes  SHORT TERM GOALS: Target date: 03/30/24  Pt will be indep to perform HEP for increasing strength and coordination throughout the LUE. Baseline: Eval: Not yet initiated; 03/31/24: Indep with HEP and verbalizes understanding of progressions made today for frequency Goal status: achieved  LONG TERM GOALS:  Target date: 08/05/24  Pt will increase LUE MMT by 1/2 grade or more to increase engagement of LUE into ADL/IADLs. Baseline: Eval: LUE grossly 4-/5 throughout (R 5/5); 03/31/24: L shoulder grossly 4-, elbow flex 4+, ext 4, forearm pron/sup 4+; 05/13/24: L shoulder 4 to 4+/5, elbow 5/5, wrist 4+ to 5/5 Goal status: achieved  2.  Pt will increase L grip strength by 20 lbs or more to enable pt to grasp and carry heavy ADL supplies in L hand. Baseline: Eval: L 51 lbs (R 111 lbs); 03/31/24: L grip 31 lbs (R 101 lbs); 05/13/24: L grip 44 lbs (edema in L hand has caused fluctuations in hand strength since eval); 05/25/24: L grip 44 lbs Goal status: in progress  3.  Pt will increase L hand [facility] skills as demonstrated by completion of 9 hole peg test in 30 sec or less to improve manipulation of small ADL supplies. Baseline: Eval: L 1 min 27 sec (R 25 sec); 03/31/24: 3 trials completed today: L 52 sec, 1 min 4 sec, 39 sec; 05/13/24: L 43 sec (R 25 sec); 05/25/24: L 36 sec (after several trials) Goal status: in progress  4.  Pt will increase LUE GMC to enable confidence with moving hot pots/pans on/off stove top and in/out of the oven using BUEs. Baseline: Eval: Pt reports that his friend currently assists with heavier meal prep; 03/31/24: Pt is now cooking independently, but with increased time/caution; 05/13/24: LUE GMC has greatly improved, though pt still demos mild dysmetria and must use caution when lifting/carrying items noted above; 05/25/24: mild dysmetria/continues to use caution Goal status: in progress  5. Pt will increase tolerance for engaging the LUE  into sustained pushing/grasping manuevers to enable push mowing yard in <45 min.  Baseline: Recert 05/13/24: Pt reports he prefers family to mow his lawn as it takes him increased time, estimating 45-60 min (brother mows in  30 min; 05/25/24: Limited mowing since L foot metatarsal fx  Goal status: in progress   6.  Pt will efficiently pick up flat items from table top using L hand.  Baseline: Recert 05/13/24: Pt requires extra time/repeat trials with attempts at picking up coins, flat stones, paper from table top when using  L hand; 05/25/24: extra time for above (see 05/13/24)  Goal status: in progress  ASSESSMENT:  CLINICAL IMPRESSION: Pt with good tolerance to LUE FMC/GMC activities and strengthening activities this date.  Reaching precision continues to be decreased with a supinated forearm, though this is improving.  Pt continues to frequently drop small items in L hand when moving items from palm to fingertips, especially when attempting to perform with speed or while dual tasking.  Pt requires extra time to pick up flat/small items from table top.  Pt reports that he is now able to mow his lawn in ~45 min, which has improved from ~60 min.  Pt estimated 30 min to be his baseline time prior to CVA.  Pt acknowledges that L hand does experience muscle fatigue while mowing, as pt is operating a push mower.  Pt will continue to benefit from skilled OT to work towards above noted goals for improving indep with daily tasks, return to work, reduce burden of care on caregivers, and improve QOL.    PERFORMANCE DEFICITS: in functional skills including ADLs, IADLs, coordination, dexterity, sensation, tone, strength, Fine motor control, Gross motor control, mobility, balance, body mechanics, endurance, decreased knowledge of precautions, decreased knowledge of use of DME, vision, and UE functional use,  cognitive skills including memory, and psychosocial skills including coping strategies, environmental  adaptation, and routines and behaviors.   IMPAIRMENTS: are limiting patient from ADLs, IADLs, work, and leisure.   CO-MORBIDITIES: has co-morbidities such as tobacco use disorder, HTN, arthritis that affects occupational performance. Patient will benefit from skilled OT to address above impairments and improve overall function.  MODIFICATION OR ASSISTANCE TO COMPLETE EVALUATION: No modification of tasks or assist necessary to complete an evaluation.  OT OCCUPATIONAL PROFILE AND HISTORY: Detailed assessment: Review of records and additional review of physical, cognitive, psychosocial history related to current functional performance.  CLINICAL DECISION MAKING: Moderate - several treatment options, min-mod task modification necessary  REHAB POTENTIAL: Good  EVALUATION COMPLEXITY: Moderate  PLAN:  OT FREQUENCY: 2x/week  OT DURATION: 12 weeks  PLANNED INTERVENTIONS: 97168 OT Re-evaluation, 97535 self care/ADL training, 02889 therapeutic exercise, 97530 therapeutic activity, 97112 neuromuscular re-education, 97140 manual therapy, 97116 gait training, 02989 moist heat, 97010 cryotherapy, 97750 Physical Performance Testing, passive range of motion, balance training, functional mobility training, visual/perceptual remediation/compensation, psychosocial skills training, energy conservation, coping strategies training, patient/family education, and DME and/or AE instructions  RECOMMENDED OTHER SERVICES: SLP eval and treat d/t pt reporting memory deficits since CVA, drooling, and slurred speech  CONSULTED AND AGREED WITH PLAN OF CARE: Patient  PLAN FOR NEXT SESSION: see above   Inocente Blazing, MS, OTR/L   06/03/2024, 8:15 AM

## 2024-06-03 NOTE — Therapy (Signed)
 OUTPATIENT OCCUPATIONAL THERAPY NEURO TREATMENT NOTE  Patient Name: Mark Mcintosh MRN: [id number] DOB:1971/01/05, 53 y.o., male Today's Date: 06/06/2024  PCP: No PCP REFERRING PROVIDER: [name], PA-C  END OF SESSION:   OT End of Session - 06/06/24 1251     Visit Number 23    Number of Visits 41    Date for Recertification  08/05/24    Progress Note Due on Visit 30    OT Start Time 1400    OT Stop Time 1445    OT Time Calculation (min) 45 min    Activity Tolerance Patient tolerated treatment well    Behavior During Therapy [facility] for tasks assessed/performed         Past Medical History:  Diagnosis Date   Acute CVA (cerebrovascular accident) (HCC) 01/09/2024   Arthritis    Hypertension    Stroke (HCC) 01/09/2024   Stated he had 2 strokes.   Past Surgical History:  Procedure Laterality Date   BREAST BIOPSY Right 03/15/2024   US  RT BREAST BX W LOC DEV 1ST LESION IMG BX SPEC US  GUIDE 03/15/2024 [facility]   TEE WITHOUT CARDIOVERSION N/A 01/14/2024   Procedure: ECHOCARDIOGRAM, TRANSESOPHAGEAL;  Surgeon: Alluri, Keller BROCKS, MD;  Location: [facility] ORS;  Service: Cardiovascular;  Laterality: N/A;   Patient Active Problem List   Diagnosis Date Noted   Hemiparesis affecting left side as late effect of cerebrovascular accident (HCC) 04/01/2024   Mass of lower outer quadrant of right breast 02/27/2024   History of cerebrovascular accident (CVA) with residual deficit 02/27/2024   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) 02/27/2024   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) 02/27/2024   Gait abnormality 02/27/2024   Left-sided weakness 02/27/2024   Prediabetes 02/27/2024   Abnormal pulse oximetry 02/27/2024   Vitamin D  deficiency 02/27/2024   Bradycardia 02/27/2024   Depression, major, single episode, moderate (HCC) 02/27/2024   Coping style affecting medical condition 01/20/2024   Tobacco use disorder 01/09/2024    Obesity (BMI 30-39.9) 01/09/2024   Hypertension    ONSET DATE: 01/09/24  REFERRING DIAG: I63.9 (ICD-10-CM) - Acute CVA (cerebrovascular accident) (HCC)   THERAPY DIAG:  Muscle weakness (generalized)  Other lack of coordination  Acute CVA (cerebrovascular accident) (HCC)  Rationale for Evaluation and Treatment: Rehabilitation  SUBJECTIVE:  SUBJECTIVE STATEMENT: Pt reports that he limited his fluid intake after dinner this week, which helped to significantly reduce night time voiding. Pt accompanied by: self  PERTINENT HISTORY: Per chart:  Mark Mcintosh is a 53 y.o. right-handed male with history significant for bilateral conjunctival inflammation hypertension as well as tobacco use and class II morbid obesity with BMI 36.26.  Per chart review patient lives alone independent prior to admission.  Presented to [facility] 01/09/2024 with acute onset of left-sided weakness.  MRI showed acute infarct in the posterior limb of the right internal capsule, the overlying right frontal white matter and pons.  Multiple remote infarcts and chronic microvascular ischemic disease.  CTA showed no large vessel occlusion.  Admission chemistries unremarkable except potassium 3.1.  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  TEE with small PFO per cardiology services with no plan for closure.  Neurology follow-up placed on aspirin  and Plavix  for CVA prophylaxis x 3 weeks then aspirin  alone.  Lovenox  for DVT prophylaxis but bilateral Doppler studies negative.  Therapy evaluations completed due to patient decreased functional mobility left-sided weakness was admitted for a comprehensive rehab program.   PRECAUTIONS: Fall  WEIGHT BEARING  RESTRICTIONS: No  PAIN: 06/03/24: no pain Are you having pain? 0/10  FALLS: Has patient fallen in last 6 months? Yes. 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  Get back my independence.    OBJECTIVE:  Note: Objective measures were completed at Evaluation unless otherwise noted.  HAND DOMINANCE: Right  ADLs: Overall ADLs: Son was staying with pt for about a week after d/c from inpatient rehab, but has since returned home.  Friend comes by every other day to check in.   Transfers/ambulation related to ADLs: modified indep with rollator Eating: increased difficulty with cutting food  Grooming: increased difficulty with clipping nails, otherwise manages fine with dominant/unaffected hand UB Dressing: increased time with clothing fasteners LB Dressing: increased time with clothing fasteners  Toileting: increased time to engage L hand into clothing management Bathing: pt sits on shower chair to wash LEs, some SOB with standing in shower Tub Shower transfers: Modified indep Equipment: Shower seat without back  IADLs: Shopping: motorized cart, can push cart for shorter shopping trips.  Light housekeeping: extra time and cautions to avoid falls, sometimes feels SOB with laundry  Meal Prep: friend helps with stove top cooking, pt can do light hot and cold meal prep  Community mobility: rollator, friend drove pt  Medication management: indep  Landscape architect: indep  Handwriting: NT; L non-dominant hand affected   MOBILITY STATUS: Hx of falls, rollator or funiture/wall walking in the home, rollator for community  POSTURE COMMENTS:  L sided hemiparesis   ACTIVITY TOLERANCE: Activity tolerance: Pt reports some dyspnea with prolonged standing/mobility  UPPER EXTREMITY ROM:  BUEs WFL  UPPER EXTREMITY MMT:     MMT Right eval Left eval Left 03/31/24 Left 05/13/24  Shoulder  flexion 5 4- 4- 4+  Shoulder abduction 5 4- 4- 4+  Shoulder adduction      Shoulder extension      Shoulder internal rotation   4- 4+  Shoulder external rotation   3+ 4  Middle trapezius      Lower trapezius      Elbow flexion 5 4- 4+ 5  Elbow extension 5 4- 4+ 5  Wrist flexion 5 4- 4+ 5  Wrist extension 5 4- 4 4+  Wrist ulnar deviation      Wrist radial deviation      Wrist pronation 5 4- 4+ 4+  Wrist supination 5 4- 4+ 4+  (Blank rows = not tested)  HAND FUNCTION: Eval: Grip strength: Right: 111 lbs; Left: 51 lbs, Lateral pinch: Right: 23 lbs, Left: 13 lbs, and 3 point pinch: Right: 26 lbs, Left: 13 lbs 03/31/24: Grip strength: Right: 101 lbs, Left: 31 lbs, lateral pinch: Right: 23 lbs, Left: 13 lbs, 3 point pinch: Right: 25 lbs, Left: 14 lbs  04/06/24: Grip strength: Left: 32 lbs 04/13/24: Grip strength: Left: 45 lbs  05/04/24: Grip strength: Left: 24 lbs before stretching hand; 40 lbs after stretching; Lateral pinch: Left: 15 lbs, 3 point pinch: Left: 14 lbs  05/13/24: Grip strength: Left: 44 lbs; Lateral pinch: Left: 16 lbs, 3 point pinch:  Left: 13 lbs  05/25/24: Grip strength: Left: 44 lbs, Lateral pinch: Left: 16 lbs, 3 point pinch: Left: 11 lbs (measured on [place]; previously measured on standard)  COORDINATION: Finger Nose Finger test: increased time and decreased accuracy on the L  9 Hole Peg test: Right: 25 sec; Left: 1 min 27 sec 03/31/24: L 52 sec, 1 min 4 sec, 39 sec  05/13/24: 43 sec  05/25/24: 36 sec (after multiple trials)   05/13/24: Finger knee to nose 5x: R: 5 sec, L: 6 sec, mild dysmetria 05/25/24: L 6 sec, mild dysmetria   SENSATION: Pt reports increased tingling/numbness when hand balls up a little, but when he straightens it out it goes back to normal.   EDEMA: no visible edema   MUSCLE TONE: LUE: Mild   COGNITION: Overall cognitive status: Within functional limits for tasks assessed pt reports difficulties with memory   VISION:             Subjective  report: Wears glasses all the time.  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                                                                                                             TREATMENT DATE: 06/03/24 Therapeutic Exercise: -L grip strengthening: Hand gripper set at moderate resistance with 3 red bands: 4 sets 15 reps.  Rest between sets and min vc for performing self passive wrist and digit stretch between sets.   Neuro re-ed: -Facilitated L hand FMC/dexterity skills working to place grooved pegs into pegboard.  Pt practiced picking pegs up from table top, storing multiple in hand, and translatory movements moving pegs from palm to fingertips in prep for discarding. -Facilitated LUE GMC skills reaching toward a target: pt placed grooved pegs into upright pegboard without support of arm on tabletop.   Therapeutic Activity: -Facilitated LUE GMC/FMC working to place [employer].  Pegboard placed on an incline to challenge forward reach, and pt practiced storing 2 pegs in hand while discarding 1 dot side up, filling pegboard perimeter.  Pt then rotated pegs 180* to dot side down, given vc to minimize use of substitution patterns (use of table top or body to stabilize peg) during peg rotation. -Facilitated dual tasking with conversation while pt participated in L hand pinch strengthening (lateral and 3 point pinching) and GMC while placing clips on vertical and horizontal dowels.  Pt also practiced quick/alternating movements with pron/sup to  move clips on/off a horizontal dowel without LUE supported on table top.  PATIENT EDUCATION: Education details: LUE GMC/FMC activities  Person educated: Patient Education method: Verbal cues Education comprehension: verbalized understanding  HOME EXERCISE PROGRAM: [place]  theraputty 2-3x per day for 5-10 min periods for L hand grip strengthening, [facility] handout Tendon glides, passive stretching to the L wrist/digits, L hand AROM  GOALS: Goals reviewed with patient? Yes  SHORT TERM GOALS: Target date: 03/30/24  Pt will be indep to perform HEP for increasing strength and coordination throughout the LUE. Baseline: Eval: Not yet initiated; 03/31/24: Indep with HEP and verbalizes understanding of progressions made today for frequency Goal status: achieved  LONG TERM GOALS: Target date: 08/05/24  Pt will increase LUE MMT by 1/2 grade or more to increase engagement of LUE into ADL/IADLs. Baseline: Eval: LUE grossly 4-/5 throughout (R 5/5); 03/31/24: L shoulder grossly 4-, elbow flex 4+, ext 4, forearm pron/sup 4+; 05/13/24: L shoulder 4 to 4+/5, elbow 5/5, wrist 4+ to 5/5 Goal status: achieved  2.  Pt will increase L grip strength by 20 lbs or more to enable pt to grasp and carry heavy ADL supplies in L hand. Baseline: Eval: L 51 lbs (R 111 lbs); 03/31/24: L grip 31 lbs (R 101 lbs); 05/13/24: L grip 44 lbs (edema in L hand has caused fluctuations in hand strength since eval); 05/25/24: L grip 44 lbs Goal status: in progress  3.  Pt will increase L hand [facility] skills as demonstrated by completion of 9 hole peg test in 30 sec or less to improve manipulation of small ADL supplies. Baseline: Eval: L 1 min 27 sec (R 25 sec); 03/31/24: 3 trials completed today: L 52 sec, 1 min 4 sec, 39 sec; 05/13/24: L 43 sec (R 25 sec); 05/25/24: L 36 sec (after several trials) Goal status: in progress  4.  Pt will increase LUE GMC to enable confidence with moving hot pots/pans on/off stove top and in/out of the oven using  BUEs. Baseline: Eval: Pt reports that his friend currently assists with heavier meal prep; 03/31/24: Pt is now cooking independently, but with increased time/caution; 05/13/24: LUE GMC has greatly improved, though pt still demos mild dysmetria and must use caution when lifting/carrying items noted above; 05/25/24: mild dysmetria/continues to use caution Goal status: in progress  5. Pt will increase tolerance for engaging the LUE into sustained pushing/grasping manuevers to enable push mowing yard in <45 min.  Baseline: Recert 05/13/24: Pt reports he prefers family to mow his lawn as it takes him increased time, estimating 45-60 min (brother mows in  30 min; 05/25/24: Limited mowing since L foot metatarsal fx  Goal status: in progress   6.  Pt will efficiently pick up flat items from table top using L hand.  Baseline: Recert 05/13/24: Pt requires extra time/repeat trials with attempts at picking up coins, flat stones, paper from table top when using  L hand; 05/25/24: extra time for above (see 05/13/24)  Goal status: in progress  ASSESSMENT:  CLINICAL IMPRESSION: Pt with good tolerance to LUE FMC/GMC activities and strengthening activities this date.  Reaching precision continues to be decreased with a supinated forearm, though this is improving.  Pt able to place [place] board pegs onto pegboard on an incline, though pt had some occasional dropping of pegs to floor when working with 2 pegs in hand at once, or when working to rotate pegs within fingertips when cued to avoid use of table top or stabilizing peg against body.  Pt will continue to benefit from skilled OT to work towards above noted goals for improving indep with daily tasks, return to work, reduce burden of care on caregivers, and improve QOL.  PERFORMANCE DEFICITS: in functional skills including ADLs, IADLs, coordination, dexterity, sensation, tone, strength, Fine motor control, Gross motor control, mobility, balance, body mechanics,  endurance, decreased knowledge of precautions, decreased knowledge of use of DME, vision, and UE functional use, cognitive skills including memory, and psychosocial skills including coping strategies, environmental adaptation, and routines and behaviors.   IMPAIRMENTS: are limiting patient from ADLs, IADLs, work, and leisure.   CO-MORBIDITIES: has co-morbidities such as tobacco use disorder, HTN, arthritis that affects occupational performance. Patient will benefit from skilled OT to address above impairments and improve overall function.  MODIFICATION OR ASSISTANCE TO COMPLETE EVALUATION: No modification of tasks or assist necessary to complete an evaluation.  OT OCCUPATIONAL PROFILE AND HISTORY: Detailed assessment: Review of records and additional review of physical, cognitive, psychosocial history related to current functional performance.  CLINICAL DECISION MAKING: Moderate - several treatment options, min-mod task modification necessary  REHAB POTENTIAL: Good  EVALUATION COMPLEXITY: Moderate  PLAN:  OT FREQUENCY: 2x/week  OT DURATION: 12 weeks  PLANNED INTERVENTIONS: 97168 OT Re-evaluation, 97535 self care/ADL training, 02889 therapeutic exercise, 97530 therapeutic activity, 97112 neuromuscular re-education, 97140 manual therapy, 97116 gait training, 02989 moist heat, 97010 cryotherapy, 97750 Physical Performance Testing, passive range of motion, balance training, functional mobility training, visual/perceptual remediation/compensation, psychosocial skills training, energy conservation, coping strategies training, patient/family education, and DME and/or AE instructions  RECOMMENDED OTHER SERVICES: SLP eval and treat d/t pt reporting memory deficits since CVA, drooling, and slurred speech  CONSULTED AND AGREED WITH PLAN OF CARE: Patient  PLAN FOR NEXT SESSION: see above   Inocente Blazing, MS, OTR/L   06/06/2024, 12:53 PM

## 2024-06-08 ENCOUNTER — Ambulatory Visit

## 2024-06-08 ENCOUNTER — Encounter: Admitting: Speech Pathology

## 2024-06-08 DIAGNOSIS — R2681 Unsteadiness on feet: Secondary | ICD-10-CM

## 2024-06-08 DIAGNOSIS — I639 Cerebral infarction, unspecified: Secondary | ICD-10-CM

## 2024-06-08 DIAGNOSIS — M6281 Muscle weakness (generalized): Secondary | ICD-10-CM

## 2024-06-08 DIAGNOSIS — R262 Difficulty in walking, not elsewhere classified: Secondary | ICD-10-CM

## 2024-06-08 DIAGNOSIS — R278 Other lack of coordination: Secondary | ICD-10-CM

## 2024-06-08 DIAGNOSIS — R2689 Other abnormalities of gait and mobility: Secondary | ICD-10-CM

## 2024-06-08 NOTE — Therapy (Signed)
 OUTPATIENT OCCUPATIONAL THERAPY NEURO TREATMENT NOTE  Patient Name: Mark Mcintosh MRN: [id number] DOB:03-May-1971, 53 y.o., male Today's Date: 06/08/2024  PCP: No PCP REFERRING PROVIDER: [name], PA-C  END OF SESSION:   OT End of Session - 06/08/24 1649     Visit Number 24    Number of Visits 41    Date for Recertification  08/05/24    Progress Note Due on Visit 30    OT Start Time 1445    OT Stop Time 1530    OT Time Calculation (min) 45 min    Activity Tolerance Patient tolerated treatment well    Behavior During Therapy [facility] for tasks assessed/performed         Past Medical History:  Diagnosis Date   Acute CVA (cerebrovascular accident) (HCC) 01/09/2024   Arthritis    Hypertension    Stroke (HCC) 01/09/2024   Stated he had 2 strokes.   Past Surgical History:  Procedure Laterality Date   BREAST BIOPSY Right 03/15/2024   US  RT BREAST BX W LOC DEV 1ST LESION IMG BX SPEC US  GUIDE 03/15/2024 [facility]   TEE WITHOUT CARDIOVERSION N/A 01/14/2024   Procedure: ECHOCARDIOGRAM, TRANSESOPHAGEAL;  Surgeon: Alluri, Keller BROCKS, MD;  Location: [facility] ORS;  Service: Cardiovascular;  Laterality: N/A;   Patient Active Problem List   Diagnosis Date Noted   Hemiparesis affecting left side as late effect of cerebrovascular accident (HCC) 04/01/2024   Mass of lower outer quadrant of right breast 02/27/2024   History of cerebrovascular accident (CVA) with residual deficit 02/27/2024   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) 02/27/2024   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) 02/27/2024   Gait abnormality 02/27/2024   Left-sided weakness 02/27/2024   Prediabetes 02/27/2024   Abnormal pulse oximetry 02/27/2024   Vitamin D  deficiency 02/27/2024   Bradycardia 02/27/2024   Depression, major, single episode, moderate (HCC) 02/27/2024   Coping style affecting medical condition 01/20/2024   Tobacco use disorder 01/09/2024    Obesity (BMI 30-39.9) 01/09/2024   Hypertension    ONSET DATE: 01/09/24  REFERRING DIAG: I63.9 (ICD-10-CM) - Acute CVA (cerebrovascular accident) (HCC)   THERAPY DIAG:  Muscle weakness (generalized)  Other lack of coordination  Acute CVA (cerebrovascular accident) (HCC)  Rationale for Evaluation and Treatment: Rehabilitation  SUBJECTIVE:  SUBJECTIVE STATEMENT: Pt verbalized concern for return to work (see below). Pt accompanied by: self  PERTINENT HISTORY: Per chart:  Drayke Grabel is a 53 y.o. right-handed male with history significant for bilateral conjunctival inflammation hypertension as well as tobacco use and class II morbid obesity with BMI 36.26.  Per chart review patient lives alone independent prior to admission.  Presented to [facility] 01/09/2024 with acute onset of left-sided weakness.  MRI showed acute infarct in the posterior limb of the right internal capsule, the overlying right frontal white matter and pons.  Multiple remote infarcts and chronic microvascular ischemic disease.  CTA showed no large vessel occlusion.  Admission chemistries unremarkable except potassium 3.1.  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  TEE with small PFO per cardiology services with no plan for closure.  Neurology follow-up placed on aspirin  and Plavix  for CVA prophylaxis x 3 weeks then aspirin  alone.  Lovenox  for DVT prophylaxis but bilateral Doppler studies negative.  Therapy evaluations completed due to patient decreased functional mobility left-sided weakness was admitted for a comprehensive rehab program.   PRECAUTIONS: Fall  WEIGHT BEARING RESTRICTIONS: No  PAIN: 06/08/24: no pain Are you having pain?  0/10  FALLS: Has patient fallen in last 6 months? Yes. 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  Get back my independence.    OBJECTIVE:  Note: Objective measures were completed at Evaluation unless otherwise noted.  HAND DOMINANCE: Right  ADLs: Overall ADLs: Son was staying with pt for about a week after d/c from inpatient rehab, but has since returned home.  Friend comes by every other day to check in.   Transfers/ambulation related to ADLs: modified indep with rollator Eating: increased difficulty with cutting food  Grooming: increased difficulty with clipping nails, otherwise manages fine with dominant/unaffected hand UB Dressing: increased time with clothing fasteners LB Dressing: increased time with clothing fasteners  Toileting: increased time to engage L hand into clothing management Bathing: pt sits on shower chair to wash LEs, some SOB with standing in shower Tub Shower transfers: Modified indep Equipment: Shower seat without back  IADLs: Shopping: motorized cart, can push cart for shorter shopping trips.  Light housekeeping: extra time and cautions to avoid falls, sometimes feels SOB with laundry  Meal Prep: friend helps with stove top cooking, pt can do light hot and cold meal prep  Community mobility: rollator, friend drove pt  Medication management: indep  Landscape architect: indep  Handwriting: NT; L non-dominant hand affected   MOBILITY STATUS: Hx of falls, rollator or funiture/wall walking in the home, rollator for community  POSTURE COMMENTS:  L sided hemiparesis   ACTIVITY TOLERANCE: Activity tolerance: Pt reports some dyspnea with prolonged standing/mobility  UPPER EXTREMITY ROM:  BUEs WFL  UPPER EXTREMITY MMT:     MMT Right eval Left eval Left 03/31/24 Left 05/13/24  Shoulder flexion 5 4- 4- 4+  Shoulder abduction 5 4- 4- 4+  Shoulder adduction       Shoulder extension      Shoulder internal rotation   4- 4+  Shoulder external rotation   3+ 4  Middle trapezius      Lower trapezius      Elbow flexion 5 4- 4+ 5  Elbow extension 5 4- 4+ 5  Wrist flexion 5 4- 4+ 5  Wrist extension 5 4- 4 4+  Wrist ulnar deviation      Wrist radial deviation      Wrist pronation 5 4- 4+ 4+  Wrist supination 5 4- 4+ 4+  (Blank rows = not tested)  HAND FUNCTION: Eval: Grip strength: Right: 111 lbs; Left: 51 lbs, Lateral pinch: Right: 23 lbs, Left: 13 lbs, and 3 point pinch: Right: 26 lbs, Left: 13 lbs 03/31/24: Grip strength: Right: 101 lbs, Left: 31 lbs, lateral pinch: Right: 23 lbs, Left: 13 lbs, 3 point pinch: Right: 25 lbs, Left: 14 lbs  04/06/24: Grip strength: Left: 32 lbs 04/13/24: Grip strength: Left: 45 lbs  05/04/24: Grip strength: Left: 24 lbs before stretching hand; 40 lbs after stretching; Lateral pinch: Left: 15 lbs, 3 point pinch: Left: 14 lbs  05/13/24: Grip strength: Left: 44 lbs; Lateral pinch: Left: 16 lbs, 3 point pinch: Left: 13 lbs  05/25/24: Grip strength: Left: 44 lbs, Lateral  pinch: Left: 16 lbs, 3 point pinch: Left: 11 lbs (measured on [place]; previously measured on standard) 06/08/24: Grip strength: Left: 46 lbs, 44 lbs, 45 lbs (3 trials)  COORDINATION: Finger Nose Finger test: increased time and decreased accuracy on the L  9 Hole Peg test: Right: 25 sec; Left: 1 min 27 sec 03/31/24: L 52 sec, 1 min 4 sec, 39 sec  05/13/24: 43 sec  05/25/24: 36 sec (after multiple trials)   05/13/24: Finger knee to nose 5x: R: 5 sec, L: 6 sec, mild dysmetria 05/25/24: L 6 sec, mild dysmetria   SENSATION: Pt reports increased tingling/numbness when hand balls up a little, but when he straightens it out it goes back to normal.   EDEMA: no visible edema   MUSCLE TONE: LUE: Mild   COGNITION: Overall cognitive status: Within functional limits for tasks assessed pt reports difficulties with memory   VISION:             Subjective report: Wears  glasses all the time.  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 -Advised on pt specific recovery expectations re: hand strength.  OT has advised pt to continue with current HEP, but to understand his personal grip strength measures are showing plateau on the L.  OT does not anticipate that delaying return to work until 2026 would be necessary as grip strength measures are showing plateau.  Reinforced need to discuss possible accommodations for gripping/lifting heavy items.  Encouraged pt have coworkers send photos of work space items to allow therapy to simulate return to work activities (specific bucket handles to practice transporting and carrying), and to identify specific heights at [employer] are lifting and pouring buckets.  -Education provided on typical post stroke recovery windows at which neuroplastic changes typically occur. 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   PATIENT EDUCATION: Education details: Return to work  Person educated: Patient Education method: Verbal cues Education comprehension: verbalized understanding  HOME EXERCISE PROGRAM: Landy  theraputty 2-3x per day for 5-10 min periods for L hand grip strengthening, [facility] handout Tendon glides, passive stretching to the L wrist/digits, L hand AROM  GOALS: Goals reviewed with patient? Yes  SHORT TERM GOALS: Target date: 03/30/24  Pt will be indep to perform HEP for increasing strength and coordination throughout the LUE. Baseline: Eval: Not yet initiated; 03/31/24: Indep with HEP and verbalizes understanding of progressions made today for frequency Goal status: achieved  LONG TERM GOALS: Target date: 08/05/24  Pt will increase LUE MMT by 1/2 grade or more to increase engagement of LUE into ADL/IADLs. Baseline: Eval: LUE grossly 4-/5 throughout (R 5/5); 03/31/24: L shoulder grossly 4-, elbow flex 4+, ext 4, forearm pron/sup 4+; 05/13/24: L shoulder 4 to 4+/5, elbow 5/5, wrist 4+ to 5/5 Goal status: achieved  2.  Pt will increase L grip strength by 20 lbs or more to enable pt to grasp and carry heavy ADL supplies in L hand. Baseline: Eval: L 51 lbs (R 111 lbs); 03/31/24: L grip 31 lbs (R 101 lbs); 05/13/24: L grip 44 lbs (edema in L hand has caused fluctuations in hand strength since eval); 05/25/24: L grip 44 lbs Goal status: in progress  3.  Pt will increase L hand [facility] skills as demonstrated by completion of 9 hole peg test in 30 sec or less to improve manipulation of small ADL supplies. Baseline: Eval: L 1 min 27 sec (R 25 sec); 03/31/24: 3 trials completed today: L 52 sec, 1 min 4 sec, 39 sec; 05/13/24: L 43 sec (R 25 sec); 05/25/24: L 36 sec (after several trials) Goal status: in progress  4.  Pt will increase LUE GMC to enable confidence with moving hot pots/pans on/off stove top and in/out of  the oven using BUEs. Baseline: Eval: Pt reports that his friend currently assists with heavier meal prep; 03/31/24: Pt is now cooking independently, but with increased time/caution; 05/13/24: LUE GMC has greatly improved, though pt still demos mild dysmetria and must use caution when lifting/carrying items noted above; 05/25/24: mild dysmetria/continues to use caution Goal status: in progress  5. Pt will increase tolerance for engaging the LUE into sustained pushing/grasping manuevers to enable push mowing yard in <45 min.  Baseline: Recert 05/13/24: Pt reports he prefers family to mow his lawn as it takes him increased time, estimating 45-60 min (brother mows in  30 min; 05/25/24: Limited mowing since L foot metatarsal fx  Goal status: in progress   6.  Pt will efficiently pick up flat items from table top using L hand.  Baseline: Recert 05/13/24: Pt requires extra time/repeat trials with attempts at picking up coins, flat stones, paper from table top when using  L hand; 05/25/24: extra time for above (see 05/13/24)  Goal status: in progress  ASSESSMENT: CLINICAL IMPRESSION: Pt expresses stress and concern with not being able to meet the physical demands of his current job.  Return to work date is still end of Nov.  Pt reports that MD has written restrictions to include no lifting more than 20 lbs, and no lifting overhead.  Pt verbalizes concern that his job may not honor these restrictions.  Though tearful at times throughout session, pt receptive to recommendations for contacting work to discuss above noted concerns to help with planning and assessing his readiness to perform job functions before his return to work date.  L grip strength measures are showing plateau in the mid 40 lbs, though pt acknowledges understanding of recommendation to continue  HEP.   LUE GMC/FMC skills have not yet shown a plateau, though LUE still remains mildly-moderately ataxic, depending on task and position of the arm.  Pt  will continue to benefit from skilled OT to work towards above noted goals for improving indep with daily tasks, return to work, reduce burden of care on caregivers, and improve QOL.    PERFORMANCE DEFICITS: in functional skills including ADLs, IADLs, coordination, dexterity, sensation, tone, strength, Fine motor control, Gross motor control, mobility, balance, body mechanics, endurance, decreased knowledge of precautions, decreased knowledge of use of DME, vision, and UE functional use, cognitive skills including memory, and psychosocial skills including coping strategies, environmental adaptation, and routines and behaviors.   IMPAIRMENTS: are limiting patient from ADLs, IADLs, work, and leisure.   CO-MORBIDITIES: has co-morbidities such as tobacco use disorder, HTN, arthritis that affects occupational performance. Patient will benefit from skilled OT to address above impairments and improve overall function.  MODIFICATION OR ASSISTANCE TO COMPLETE EVALUATION: No modification of tasks or assist necessary to complete an evaluation.  OT OCCUPATIONAL PROFILE AND HISTORY: Detailed assessment: Review of records and additional review of physical, cognitive, psychosocial history related to current functional performance.  CLINICAL DECISION MAKING: Moderate - several treatment options, min-mod task modification necessary  REHAB POTENTIAL: Good  EVALUATION COMPLEXITY: Moderate  PLAN:  OT FREQUENCY: 2x/week  OT DURATION: 12 weeks  PLANNED INTERVENTIONS: 97168 OT Re-evaluation, 97535 self care/ADL training, 02889 therapeutic exercise, 97530 therapeutic activity, 97112 neuromuscular re-education, 97140 manual therapy, 97116 gait training, 02989 moist heat, 97010 cryotherapy, 97750 Physical Performance Testing, passive range of motion, balance training, functional mobility training, visual/perceptual remediation/compensation, psychosocial skills training, energy conservation, coping strategies training,  patient/family education, and DME and/or AE instructions  RECOMMENDED OTHER SERVICES: SLP eval and treat d/t pt reporting memory deficits since CVA, drooling, and slurred speech  CONSULTED AND AGREED WITH PLAN OF CARE: Patient  PLAN FOR NEXT SESSION: see above   Inocente Blazing, MS, OTR/L   06/08/2024, 4:51 PM

## 2024-06-08 NOTE — Therapy (Signed)
 OUTPATIENT PHYSICAL THERAPY TREATMENT/Physical Therapy Progress Note   Dates of reporting period  03/29/2024   to   06/08/2024   Patient Name: Mark Mcintosh MRN: [id number] DOB:[birth year], 53 y.o., male Today's Date: 06/09/2024  PCP: None REFERRING PROVIDER: [name], PA-C   PT End of Session - 06/08/24 1411     Visit Number 20    Number of Visits 31    Date for Recertification  07/08/24    Progress Note Due on Visit 30    PT Start Time 1400    PT Stop Time 1444    PT Time Calculation (min) 44 min    Equipment Utilized During Treatment Gait belt    Activity Tolerance Patient tolerated treatment well;No increased pain    Behavior During Therapy [facility] for tasks assessed/performed               Past Medical History:  Diagnosis Date   Acute CVA (cerebrovascular accident) (HCC) 01/09/2024   Arthritis    Hypertension    Stroke (HCC) 01/09/2024   Stated he had 2 strokes.   Past Surgical History:  Procedure Laterality Date   BREAST BIOPSY Right 03/15/2024   US  RT BREAST BX W LOC DEV 1ST LESION IMG BX SPEC US  GUIDE 03/15/2024 [facility]   TEE WITHOUT CARDIOVERSION N/A 01/14/2024   Procedure: ECHOCARDIOGRAM, TRANSESOPHAGEAL;  Surgeon: Alluri, Keller BROCKS, MD;  Location: [facility] ORS;  Service: Cardiovascular;  Laterality: N/A;   Patient Active Problem List   Diagnosis Date Noted   Hemiparesis affecting left side as late effect of cerebrovascular accident (HCC) 04/01/2024   Mass of lower outer quadrant of right breast 02/27/2024   History of cerebrovascular accident (CVA) with residual deficit 02/27/2024   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) 02/27/2024   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) 02/27/2024   Gait abnormality 02/27/2024   Left-sided weakness 02/27/2024   Prediabetes 02/27/2024   Abnormal pulse oximetry 02/27/2024   Vitamin D  deficiency 02/27/2024   Bradycardia 02/27/2024   Depression,  major, single episode, moderate (HCC) 02/27/2024   Coping style affecting medical condition 01/20/2024   Tobacco use disorder 01/09/2024   Obesity (BMI 30-39.9) 01/09/2024   Hypertension     ONSET DATE: 01/09/24  REFERRING DIAG: I63.9 (ICD-10-CM) - Acute CVA (cerebrovascular accident) (HCC)   THERAPY DIAG:  Muscle weakness (generalized)  Other lack of coordination  Acute CVA (cerebrovascular accident) (HCC)  Difficulty in walking, not elsewhere classified  Unsteadiness on feet  Other abnormalities of gait and mobility  Rationale for Evaluation and Treatment: Rehabilitation  SUBJECTIVE:  SUBJECTIVE STATEMENT:  Pt reports that he has upcoming MD visit and going to discuss the MD thoughts on returning to work. Patient reports he doubts his employment will agree to any light duty restrictions.   PERTINENT HISTORY: [facility] ED 01/09/24 for progressively worsening L sided-weakness. multiple acute infarcts concerning for embolic showering. PMH includes arthritis, HTN. 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   PAIN:  Are you having pain? No  PRECAUTIONS: None  WEIGHT BEARING RESTRICTIONS: No  FALLS: Has patient fallen in last 6 months? Yes. Number of falls 1- pt reports that he tried to get up at home after returning from [place] and he got up too fast and fell. Pt also reports stumbling on steps at home  LIVING ENVIRONMENT: Lives with: lives alone- pt's son was staying with him until this past Friday- son lives in [place], pt has friends that come by every other day Lives in: House/apartment Stairs: Yes: External: 4 steps; can reach both Has following equipment at home: Walker - 4 wheeled and shower chair  PLOF: Independent-  pt works with mixing chemicals and dyes  PATIENT GOALS: Pt wants to get back to normal and be able to return to work  OBJECTIVE:  Note: Objective measures were completed at Evaluation unless otherwise noted.  DIAGNOSTIC FINDINGS: via chart  From 01/09/24 MRI Brain W/O Contrast: IMPRESSION: 1. Acute infarcts in the posterior limb of the right internal capsule, the overlying right frontal white matter, and pons. Given involvement of multiple vascular territories, consider an embolic etiology. 2. Multiple remote infarcts and chronic microvascular ischemic disease, detailed above.  From 01/10/24 CT Angio Head Neck W W/O CM: IMPRESSION: 1. No acute intracranial hemorrhage or mass effect. 2. No large vessel occlusion, hemodynamically significant stenosis, or aneurysm in the head or neck. 3. Remote lacunar infarcts in the genu of the left internal capsule, globus pallidus, and right corona radiata. 4. Acute infarct involving the posterior limb of the right internal capsule is less well appreciated at CT. 5. Moderately advanced periventricular white matter disease for age.   MMT:  MMT Right Eval Left Eval  Hip flexion 4- 3+  Hip extension      Hip abduction 5 5  Hip adduction 4+ 4+  Hip internal rotation      Hip external rotation      Knee flexion 5 4+  Knee extension 5 4+  Ankle dorsiflexion 5 4-  Ankle plantarflexion 5 4-  Ankle inversion      Ankle eversion      (Blank rows = not tested)  FUNCTIONAL TESTS (at eval):  5 times sit-to-stand: 17.9 seconds, pt utilizing arms on knees to assist with standing : 0.56 m/s with 4WW and CGA; 0.73 m/s with no AD and CGA BERG Balance Scale: 33/56 FGA: 18/30 on 02/25/24 : 795 ft with 4WW on 02/25/24  PATIENT SURVEYS (eval):  Stroke Impact Scale: 45/80  TREATMENT DATE: 06/08/24   Physical therapy  treatment session today consisted of completing assessment of goals and administration of testing as demonstrated and documented in flow sheet, treatment, and goals section of this note. Addition treatments may be found below.   6 Min Walk Test:  Instructed patient to ambulate as quickly and as safely as possible for 6 minutes using LRAD. Patient was allowed to take standing rest breaks without stopping the test, but if the patient required a sitting rest break the clock would be stopped and the test would be over.  Results: 1200 feet (366 meters, Avg speed 1.70m/s) using no device with supervision. Results indicate that the patient has reduced endurance with ambulation compared to age matched norms.  Age Matched Norms: 96-69 yo M: 22 F: 45, 41-79 yo M: 34 F: 471, 24-89 yo M: 417 F: 392 MDC: 58.21 meters (190.98 feet) or 50 meters (ANPTA Core Set of Outcome Measures for Adults with Neurologic Conditions, 2018)  SLS time= 17 sec at best on RLE and 20+ sec on LLE  Pt performed 5 time sit<>stand (5xSTS): 11.2 sec without UE (>15 sec indicates increased fall risk)      Body mechanics/lifting techniques: Patient reports he has to lift a 40-50lb bucket from floor to cart and from overhead to cart.  Performed lift from floor to cart - several trials - weight approx 35-40 lb (box +2 (15lb) KBs Performed lift from car to floor - several trial *Mild difficulty with left grip yet did not drop  VC for best lifting techniques   PT provided CGA to supervision assist throughout session unless otherwise noted throughout session.     PATIENT EDUCATION: Education details:  No longer requires [place].   Pt has made remarkable toward ADL based goals, however a massive gap remains between activities he has done here in clinic and what he will need to do to prepare for return to work duties.  Person educated: Patient Education method: Explanation, Demonstration, and Handouts Education comprehension:  verbalized understanding and returned demonstration  HOME EXERCISE PROGRAM: Access Code: [email or web address]  URL: [email or web address]  Date: 02/17/2024  Prepared by: Darryle Patten  Exercises: - Seated March  - 1 x daily - 3 x weekly - 3 sets - 10 reps  - Standing March with Counter Support  - 1 x daily - 3 x weekly - 3 sets - 10 reps  - Mini Squat with Counter Support  - 1 x daily - 3 x weekly - 3 sets - 10 reps  - Heel Toe Raises with Counter Support  - 1 x daily - 3 x weekly - 3 sets - 10 reps   GOALS: Goals reviewed with patient? Yes  SHORT TERM GOALS: Target date: 06/04/24  Patient will be independent in home exercise program to improve strength/mobility for better functional independence with ADL. Baseline: HEP reviewed during eval Goal status: ACHIEVED  LONG TERM GOALS: Target date: 07/08/24  Patient will increase their Stroke Impact Scale score by >10 points for improved function and independence Baseline: 45/80 8/13: 70/80 Goal status: MET   2.  Patient (< 37 years old) will complete five times sit to stand test in < 10 seconds indicating an increased LE strength and improved balance. Baseline: 17.9 seconds with UE support; 03/29/24: 11.7 hands free; 06/08/2024=11.2 sec without UE support  Goal status: progress made, goal not met   3.  Patient will increase 10 meter walk test to >0.8 m/s as to improve gait speed for better  community ambulation and to reduce fall risk. Baseline: 0.56 m/s with 5TT & 0.73 m/s with no AD ; 8/12: 1.64m/s  Goal status: ACHIEVED  4.  Patient will increase Berg Balance score by > 6 points to demonstrate decreased fall risk during functional activities. Baseline: 33/56; 03/30/24: 54/56 Goal status: ACHIEVED,    5.  Patient will increase Functional Gait Assessment score to >20/30 as to reduce fall risk and improve dynamic gait safety with community ambulation. Baseline:18/30 on 02/25/24; 03/30/24: 27/30 Goal status: ACHIEVED  6.  Patient  will increase six minute walk test distance to > 1574ft ft for progression to limited community Ambulator and improved independence with access to community and occupation  Baseline: 729ft with 4WW on 02/25/24; 03/11/24: 979ft c SPC(MET >72ft improvement); 06/08/2024= 1200 feet Goal status: MET: adjusted. NEW.   7. Pt will increase SLS to > 20 sec bil to improve safety with ADLs and IADLs including showering, shopping, and work related tasks.   Baseline: 8/13: 4 sec on the R, 3 sec on the L;   06/08/2024= 17 on R; 20 sec on L  Goal status: Progressing  ASSESSMENT:  CLINICAL IMPRESSION:  Patient presents with good motivation for today's activities- demo steady progress with balance and overall mobility- improving mostly with gait distance and no longer requiring AD. He also improved with overall balance as seen by increased SLS time. He performed well with body mechanics and lifting techniques- able to squat and lift correctly but limited by decreased left hand grip. Patient's condition has the potential to improve in response to therapy. Maximum improvement is yet to be obtained. The anticipated improvement is attainable and reasonable in a generally predictable time.  Pt will benefit from continued therapy in order to address his impairments, and improve his ability to return to work-related activities, and to improve his overall quality of life.  OBJECTIVE IMPAIRMENTS: Abnormal gait, decreased activity tolerance, decreased balance, decreased coordination, decreased endurance, decreased mobility, difficulty walking, decreased ROM, decreased strength, hypomobility, impaired perceived functional ability, impaired flexibility, impaired UE functional use, improper body mechanics, and postural dysfunction.   ACTIVITY LIMITATIONS: carrying, lifting, bending, sitting, standing, squatting, stairs, transfers, bathing, reach over head, hygiene/grooming, and locomotion level  PARTICIPATION LIMITATIONS: meal  prep, cleaning, laundry, driving, shopping, community activity, occupation, and yard work  PERSONAL FACTORS: Age and Fitness are also affecting patient's functional outcome.   REHAB POTENTIAL: Good  CLINICAL DECISION MAKING: Evolving/moderate complexity  EVALUATION COMPLEXITY: Moderate  PLAN:  PT FREQUENCY: 2x/week  PT DURATION: 12 weeks  PLANNED INTERVENTIONS: 97164- PT Re-evaluation, 97750- Physical Performance Testing, 97110-Therapeutic exercises, 97530- Therapeutic activity, [id number]- Neuromuscular re-education, 97535- Self Care, [id number]- Manual therapy, [id number]- Gait training, [id number]- Orthotic Initial, [id number]- Orthotic/Prosthetic subsequent, [phone]- Canalith repositioning, [id number]- Splinting, [id number]- Electrical stimulation (manual), [id number]- Ultrasound, [id number]- Traction (mechanical), [id number] (1-2 muscles), [id number] (3+ muscles)- Dry Needling, Patient/Family education, Balance training, Stair training, Taping, Joint mobilization, Spinal mobilization, Vestibular training, Visual/preceptual remediation/compensation, Cognitive remediation, DME instructions, Cryotherapy, and Moist heat  PLAN FOR NEXT SESSION:   Work ready activities- lifting, progressive LE strengthening- gym based activities as well.   Reyes LOISE London PT  Physical Therapist- [place]  [facility]   7:26 AM 06/09/24

## 2024-06-10 ENCOUNTER — Encounter: Admitting: Speech Pathology

## 2024-06-10 ENCOUNTER — Ambulatory Visit: Admitting: Occupational Therapy

## 2024-06-10 ENCOUNTER — Ambulatory Visit

## 2024-06-10 DIAGNOSIS — M6281 Muscle weakness (generalized): Secondary | ICD-10-CM | POA: Diagnosis not present

## 2024-06-10 DIAGNOSIS — R262 Difficulty in walking, not elsewhere classified: Secondary | ICD-10-CM

## 2024-06-10 DIAGNOSIS — R278 Other lack of coordination: Secondary | ICD-10-CM

## 2024-06-10 DIAGNOSIS — R2689 Other abnormalities of gait and mobility: Secondary | ICD-10-CM

## 2024-06-10 DIAGNOSIS — I639 Cerebral infarction, unspecified: Secondary | ICD-10-CM

## 2024-06-10 DIAGNOSIS — R2681 Unsteadiness on feet: Secondary | ICD-10-CM

## 2024-06-10 NOTE — Therapy (Signed)
 OUTPATIENT OCCUPATIONAL THERAPY NEURO TREATMENT NOTE  Patient Name: Mark Mcintosh MRN: [id number] DOB:12/28/1970, 53 y.o., male Today's Date: 06/10/2024  PCP: No PCP REFERRING PROVIDER: [name], PA-C  END OF SESSION:   OT End of Session - 06/10/24 1616     Visit Number 25    Number of Visits 41    Date for Recertification  08/05/24    OT Start Time 1400    OT Stop Time 1445    OT Time Calculation (min) 45 min    Activity Tolerance Patient tolerated treatment well    Behavior During Therapy [facility] for tasks assessed/performed         Past Medical History:  Diagnosis Date   Acute CVA (cerebrovascular accident) (HCC) 01/09/2024   Arthritis    Hypertension    Stroke (HCC) 01/09/2024   Stated he had 2 strokes.   Past Surgical History:  Procedure Laterality Date   BREAST BIOPSY Right 03/15/2024   US  RT BREAST BX W LOC DEV 1ST LESION IMG BX SPEC US  GUIDE 03/15/2024 [facility]   TEE WITHOUT CARDIOVERSION N/A 01/14/2024   Procedure: ECHOCARDIOGRAM, TRANSESOPHAGEAL;  Surgeon: Alluri, Keller BROCKS, MD;  Location: [facility] ORS;  Service: Cardiovascular;  Laterality: N/A;   Patient Active Problem List   Diagnosis Date Noted   Hemiparesis affecting left side as late effect of cerebrovascular accident (HCC) 04/01/2024   Mass of lower outer quadrant of right breast 02/27/2024   History of cerebrovascular accident (CVA) with residual deficit 02/27/2024   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) 02/27/2024   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) 02/27/2024   Gait abnormality 02/27/2024   Left-sided weakness 02/27/2024   Prediabetes 02/27/2024   Abnormal pulse oximetry 02/27/2024   Vitamin D  deficiency 02/27/2024   Bradycardia 02/27/2024   Depression, major, single episode, moderate (HCC) 02/27/2024   Coping style affecting medical condition 01/20/2024   Tobacco use disorder 01/09/2024   Obesity (BMI 30-39.9) 01/09/2024    Hypertension    ONSET DATE: 01/09/24  REFERRING DIAG: I63.9 (ICD-10-CM) - Acute CVA (cerebrovascular accident) (HCC)   THERAPY DIAG:  Muscle weakness (generalized)  Other lack of coordination  Rationale for Evaluation and Treatment: Rehabilitation  SUBJECTIVE:  SUBJECTIVE STATEMENT: Upon arrival to therapy this afternoon, Pt. requested to assess grip strength again today. Pt accompanied by: self  PERTINENT HISTORY: Per chart:  Mark Mcintosh is a 53 y.o. right-handed male with history significant for bilateral conjunctival inflammation hypertension as well as tobacco use and class II morbid obesity with BMI 36.26.  Per chart review patient lives alone independent prior to admission.  Presented to [facility] 01/09/2024 with acute onset of left-sided weakness.  MRI showed acute infarct in the posterior limb of the right internal capsule, the overlying right frontal white matter and pons.  Multiple remote infarcts and chronic microvascular ischemic disease.  CTA showed no large vessel occlusion.  Admission chemistries unremarkable except potassium 3.1.  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  TEE with small PFO per cardiology services with no plan for closure.  Neurology follow-up placed on aspirin  and Plavix  for CVA prophylaxis x 3 weeks then aspirin  alone.  Lovenox  for DVT prophylaxis but bilateral Doppler studies negative.  Therapy evaluations completed due to patient decreased functional mobility left-sided weakness was admitted for a comprehensive rehab program.   PRECAUTIONS: Fall  WEIGHT BEARING RESTRICTIONS: No  PAIN: 06/10/24: No pain 06/08/24: no pain Are you having pain? 0/10  FALLS: Has patient fallen in  last 6 months? Yes. 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  Get back my independence.    OBJECTIVE:  Note: Objective measures were completed at Evaluation unless otherwise noted.  HAND DOMINANCE: Right  ADLs: Overall ADLs: Son was staying with pt for about a week after d/c from inpatient rehab, but has since returned home.  Friend comes by every other day to check in.   Transfers/ambulation related to ADLs: modified indep with rollator Eating: increased difficulty with cutting food  Grooming: increased difficulty with clipping nails, otherwise manages fine with dominant/unaffected hand UB Dressing: increased time with clothing fasteners LB Dressing: increased time with clothing fasteners  Toileting: increased time to engage L hand into clothing management Bathing: pt sits on shower chair to wash LEs, some SOB with standing in shower Tub Shower transfers: Modified indep Equipment: Shower seat without back  IADLs: Shopping: motorized cart, can push cart for shorter shopping trips.  Light housekeeping: extra time and cautions to avoid falls, sometimes feels SOB with laundry  Meal Prep: friend helps with stove top cooking, pt can do light hot and cold meal prep  Community mobility: rollator, friend drove pt  Medication management: indep  Landscape architect: indep  Handwriting: NT; L non-dominant hand affected   MOBILITY STATUS: Hx of falls, rollator or funiture/wall walking in the home, rollator for community  POSTURE COMMENTS:  L sided hemiparesis   ACTIVITY TOLERANCE: Activity tolerance: Pt reports some dyspnea with prolonged standing/mobility  UPPER EXTREMITY ROM:  BUEs WFL  UPPER EXTREMITY MMT:     MMT Right eval Left eval Left 03/31/24 Left 05/13/24  Shoulder flexion 5 4- 4- 4+  Shoulder abduction 5 4- 4- 4+  Shoulder adduction      Shoulder extension       Shoulder internal rotation   4- 4+  Shoulder external rotation   3+ 4  Middle trapezius      Lower trapezius      Elbow flexion 5 4- 4+ 5  Elbow extension 5 4- 4+ 5  Wrist flexion 5 4- 4+ 5  Wrist extension 5 4- 4 4+  Wrist ulnar deviation      Wrist radial deviation      Wrist pronation 5 4- 4+ 4+  Wrist supination 5 4- 4+ 4+  (Blank rows = not tested)  HAND FUNCTION: Eval: Grip strength: Right: 111 lbs; Left: 51 lbs, Lateral pinch: Right: 23 lbs, Left: 13 lbs, and 3 point pinch: Right: 26 lbs, Left: 13 lbs 03/31/24: Grip strength: Right: 101 lbs, Left: 31 lbs, lateral pinch: Right: 23 lbs, Left: 13 lbs, 3 point pinch: Right: 25 lbs, Left: 14 lbs  04/06/24: Grip strength: Left: 32 lbs 04/13/24: Grip strength: Left: 45 lbs  05/04/24: Grip strength: Left: 24 lbs before stretching hand; 40 lbs after stretching; Lateral pinch: Left: 15 lbs, 3 point pinch: Left: 14 lbs  05/13/24: Grip strength: Left: 44 lbs; Lateral pinch: Left: 16 lbs, 3 point pinch: Left: 13 lbs  05/25/24: Grip strength: Left: 44 lbs, Lateral pinch: Left: 16 lbs, 3 point pinch:  Left: 11 lbs (measured on [place]; previously measured on standard) 06/08/24: Grip strength: Left: 46 lbs, 44 lbs, 45 lbs (3 trials) 06/10/24: Grip strength: eft: 47#, 53#, 51# (3 trials)  COORDINATION: Finger Nose Finger test: increased time and decreased accuracy on the L  9 Hole Peg test: Right: 25 sec; Left: 1 min 27 sec 03/31/24: L 52 sec, 1 min 4 sec, 39 sec  05/13/24: 43 sec  05/25/24: 36 sec (after multiple trials)   05/13/24: Finger knee to nose 5x: R: 5 sec, L: 6 sec, mild dysmetria 05/25/24: L 6 sec, mild dysmetria   SENSATION: Pt reports increased tingling/numbness when hand balls up a little, but when he straightens it out it goes back to normal.   EDEMA: no visible edema   MUSCLE TONE: LUE: Mild   COGNITION: Overall cognitive status: Within functional limits for tasks assessed pt reports difficulties with memory   VISION:              Subjective report: Wears glasses all the time.  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                                                                                                             TREATMENT DATE: 06/10/24  Therapeutic Activities:   -Facilitated left grip strengthening, including modulating, and holding left hand grip on various sizes of items against multi directional resistive force.  Pt. worked on sustaining left grip on a progression of large and wide, to small and thin cylindrical items,   -Facilitated left grip strength using 28.9# of resistive force in combination with flexing the trunk, and bilateral knees to obtain the vertical jumbo pegs from a pegboard placed at a lower surface, sustaining gross left hand grip while reaching up through multiple elevated planes, and across midline to discard them into a container placed at the right side. Multiple reps of semi-squats were performed.  -Lateral, and 3pt. Pinch strengthening using yellow, red, green, and blue, and black level resistive clips  -Facilitated  translatory movements moving clips from the lateral pinch position to the 3pt. Pinch position in preparation for securely placing them securely of a numerical target to far left promote motor control, and coordination tasks.  PATIENT EDUCATION: Education details:  LUE grip strengthening, hand/pinch strength, Functional Capacity Evaluation  Person educated: Patient Education method: Verbal cues Education comprehension: verbalized  understanding  HOME EXERCISE PROGRAM: Green theraputty 2-3x per day for 5-10 min periods for L hand grip strengthening, [facility] handout Tendon glides, passive stretching to the L wrist/digits, L hand AROM  GOALS: Goals reviewed with patient? Yes  SHORT TERM GOALS: Target date: 03/30/24  Pt will be indep  to perform HEP for increasing strength and coordination throughout the LUE. Baseline: Eval: Not yet initiated; 03/31/24: Indep with HEP and verbalizes understanding of progressions made today for frequency Goal status: achieved  LONG TERM GOALS: Target date: 08/05/24  Pt will increase LUE MMT by 1/2 grade or more to increase engagement of LUE into ADL/IADLs. Baseline: Eval: LUE grossly 4-/5 throughout (R 5/5); 03/31/24: L shoulder grossly 4-, elbow flex 4+, ext 4, forearm pron/sup 4+; 05/13/24: L shoulder 4 to 4+/5, elbow 5/5, wrist 4+ to 5/5 Goal status: achieved  2.  Pt will increase L grip strength by 20 lbs or more to enable pt to grasp and carry heavy ADL supplies in L hand. Baseline: Eval: L 51 lbs (R 111 lbs); 03/31/24: L grip 31 lbs (R 101 lbs); 05/13/24: L grip 44 lbs (edema in L hand has caused fluctuations in hand strength since eval); 05/25/24: L grip 44 lbs Goal status: in progress  3.  Pt will increase L hand [facility] skills as demonstrated by completion of 9 hole peg test in 30 sec or less to improve manipulation of small ADL supplies. Baseline: Eval: L 1 min 27 sec (R 25 sec); 03/31/24: 3 trials completed today: L 52 sec, 1 min 4 sec, 39 sec; 05/13/24: L 43 sec (R 25 sec); 05/25/24: L 36 sec (after several trials) Goal status: in progress  4.  Pt will increase LUE GMC to enable confidence with moving hot pots/pans on/off stove top and in/out of the oven using BUEs. Baseline: Eval: Pt reports that his friend currently assists with heavier meal prep; 03/31/24: Pt is now cooking independently, but with increased time/caution; 05/13/24: LUE GMC has greatly improved, though pt still demos mild  dysmetria and must use caution when lifting/carrying items noted above; 05/25/24: mild dysmetria/continues to use caution Goal status: in progress  5. Pt will increase tolerance for engaging the LUE into sustained pushing/grasping manuevers to enable push mowing yard in <45 min.  Baseline: Recert 05/13/24: Pt reports he prefers family to mow his lawn as it takes him increased time, estimating 45-60 min (brother mows in  30 min; 05/25/24: Limited mowing since L foot metatarsal fx  Goal status: in progress   6.  Pt will efficiently pick up flat items from table top using L hand.  Baseline: Recert 05/13/24: Pt requires extra time/repeat trials with attempts at picking up coins, flat stones, paper from table top when using  L hand; 05/25/24: extra time for above (see 05/13/24)  Goal status: in progress  ASSESSMENT: CLINICAL IMPRESSION:  Pt. was able to hold left grip firmly on varying sized objects against multiple directional resistive forces. Pt. fatigued with reps of  semi-squatting down to perform gross grip strengthening, followed by reaching up across midline to discard the pegs into a container. Modification was required for left grip strength resistance from 28.9# to 23.4#. Pt. was able to accurately place the clips onto the correct targets, however presented with incoordination with the LUE as he fatigued. Pt continues to benefit from skilled OT to work towards above noted goals for improving indep with daily tasks, return to work, reduce burden of care on caregivers, and improve QOL.    PERFORMANCE DEFICITS: in functional skills including ADLs, IADLs, coordination, dexterity, sensation, tone, strength, Fine motor control, Gross motor control, mobility, balance, body mechanics, endurance, decreased knowledge of precautions, decreased knowledge of use of DME, vision, and UE functional use, cognitive skills including memory, and psychosocial skills including coping strategies, environmental  adaptation, and routines and behaviors.   IMPAIRMENTS: are limiting patient from ADLs, IADLs, work, and leisure.   CO-MORBIDITIES: has co-morbidities such as tobacco use disorder, HTN, arthritis that affects occupational performance. Patient will benefit from skilled OT to address above impairments and improve overall function.  MODIFICATION OR ASSISTANCE TO COMPLETE EVALUATION: No modification of tasks or assist necessary to complete an evaluation.  OT OCCUPATIONAL PROFILE AND HISTORY: Detailed assessment: Review of records and additional review of physical, cognitive, psychosocial history related to current functional performance.  CLINICAL DECISION MAKING: Moderate - several treatment options, min-mod task modification necessary  REHAB POTENTIAL: Good  EVALUATION COMPLEXITY: Moderate  PLAN:  OT FREQUENCY: 2x/week  OT DURATION: 12 weeks  PLANNED INTERVENTIONS: 97168 OT Re-evaluation, 97535 self care/ADL training, 02889 therapeutic exercise, 97530 therapeutic activity, 97112 neuromuscular re-education, 97140 manual therapy, 97116 gait training, 02989 moist heat, 97010 cryotherapy, 97750 Physical Performance Testing, passive range of motion, balance training, functional mobility training, visual/perceptual remediation/compensation, psychosocial skills training, energy conservation, coping strategies training, patient/family education, and DME and/or AE instructions  RECOMMENDED OTHER SERVICES: SLP eval and treat d/t pt reporting memory deficits since CVA, drooling, and slurred speech  CONSULTED AND AGREED WITH PLAN OF CARE: Patient  PLAN FOR NEXT SESSION: see above  Richardson Otter, MS, OTR/L   06/10/2024, 4:19 PM

## 2024-06-10 NOTE — Therapy (Signed)
 OUTPATIENT PHYSICAL THERAPY TREATMENT   Patient Name: Mark Mcintosh MRN: [id number] DOB:1971/03/07, 53 y.o., male Today's Date: 06/10/2024  PCP: None REFERRING PROVIDER: [name], PA-C   PT End of Session - 06/10/24 1539     Visit Number 21    Number of Visits 31    Date for Recertification  07/08/24    Progress Note Due on Visit 30    PT Start Time 1450    PT Stop Time 1530    PT Time Calculation (min) 40 min    Equipment Utilized During Treatment Gait belt    Activity Tolerance Patient tolerated treatment well;No increased pain    Behavior During Therapy [facility] for tasks assessed/performed               Past Medical History:  Diagnosis Date   Acute CVA (cerebrovascular accident) (HCC) 01/09/2024   Arthritis    Hypertension    Stroke (HCC) 01/09/2024   Stated he had 2 strokes.   Past Surgical History:  Procedure Laterality Date   BREAST BIOPSY Right 03/15/2024   US  RT BREAST BX W LOC DEV 1ST LESION IMG BX SPEC US  GUIDE 03/15/2024 [facility]   TEE WITHOUT CARDIOVERSION N/A 01/14/2024   Procedure: ECHOCARDIOGRAM, TRANSESOPHAGEAL;  Surgeon: Alluri, Keller BROCKS, MD;  Location: [facility] ORS;  Service: Cardiovascular;  Laterality: N/A;   Patient Active Problem List   Diagnosis Date Noted   Hemiparesis affecting left side as late effect of cerebrovascular accident (HCC) 04/01/2024   Mass of lower outer quadrant of right breast 02/27/2024   History of cerebrovascular accident (CVA) with residual deficit 02/27/2024   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) 02/27/2024   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) 02/27/2024   Gait abnormality 02/27/2024   Left-sided weakness 02/27/2024   Prediabetes 02/27/2024   Abnormal pulse oximetry 02/27/2024   Vitamin D  deficiency 02/27/2024   Bradycardia 02/27/2024   Depression, major, single episode, moderate (HCC) 02/27/2024   Coping style affecting medical condition  01/20/2024   Tobacco use disorder 01/09/2024   Obesity (BMI 30-39.9) 01/09/2024   Hypertension     ONSET DATE: 01/09/24  REFERRING DIAG: I63.9 (ICD-10-CM) - Acute CVA (cerebrovascular accident) (HCC)   THERAPY DIAG:  Muscle weakness (generalized)  Other lack of coordination  Acute CVA (cerebrovascular accident) (HCC)  Difficulty in walking, not elsewhere classified  Unsteadiness on feet  Other abnormalities of gait and mobility  Rationale for Evaluation and Treatment: Rehabilitation  SUBJECTIVE:  SUBJECTIVE STATEMENT:  Pt reports still worried about light duty at work and going to discuss with MD tomorrow. Reports doing okay otherwise- working hard to get better.   PERTINENT HISTORY: [facility] ED 01/09/24 for progressively worsening L sided-weakness. multiple acute infarcts concerning for embolic showering. PMH includes arthritis, HTN. 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   PAIN:  Are you having pain? No  PRECAUTIONS: None  WEIGHT BEARING RESTRICTIONS: No  FALLS: Has patient fallen in last 6 months? Yes. Number of falls 1- pt reports that he tried to get up at home after returning from [facility] and he got up too fast and fell. Pt also reports stumbling on steps at home  LIVING ENVIRONMENT: Lives with: lives alone- pt's son was staying with him until this past Friday- son lives in [place], pt has friends that come by every other day Lives in: House/apartment Stairs: Yes: External: 4 steps; can reach both Has following equipment at home: Walker - 4 wheeled and shower chair  PLOF: Independent- pt works with mixing chemicals and dyes  PATIENT GOALS: Pt wants to get back to normal and be able to return to  work  OBJECTIVE:  Note: Objective measures were completed at Evaluation unless otherwise noted.  DIAGNOSTIC FINDINGS: via chart  From 01/09/24 MRI Brain W/O Contrast: IMPRESSION: 1. Acute infarcts in the posterior limb of the right internal capsule, the overlying right frontal white matter, and pons. Given involvement of multiple vascular territories, consider an embolic etiology. 2. Multiple remote infarcts and chronic microvascular ischemic disease, detailed above.  From 01/10/24 CT Angio Head Neck W W/O CM: IMPRESSION: 1. No acute intracranial hemorrhage or mass effect. 2. No large vessel occlusion, hemodynamically significant stenosis, or aneurysm in the head or neck. 3. Remote lacunar infarcts in the genu of the left internal capsule, globus pallidus, and right corona radiata. 4. Acute infarct involving the posterior limb of the right internal capsule is less well appreciated at CT. 5. Moderately advanced periventricular white matter disease for age.   MMT:  MMT Right Eval Left Eval  Hip flexion 4- 3+  Hip extension      Hip abduction 5 5  Hip adduction 4+ 4+  Hip internal rotation      Hip external rotation      Knee flexion 5 4+  Knee extension 5 4+  Ankle dorsiflexion 5 4-  Ankle plantarflexion 5 4-  Ankle inversion      Ankle eversion      (Blank rows = not tested)  FUNCTIONAL TESTS (at eval):  5 times sit-to-stand: 17.9 seconds, pt utilizing arms on knees to assist with standing : 0.56 m/s with 4WW and CGA; 0.73 m/s with no AD and CGA BERG Balance Scale: 33/56 FGA: 18/30 on 02/25/24 : 795 ft with 4WW on 02/25/24  PATIENT SURVEYS (eval):  Stroke Impact Scale: 45/80  TREATMENT DATE: 06/10/24  Self care: Spent time explaining potential Functional capacity evaluations and looked up clinics (locally) that offer that test. Found  one in community- [facility] and provided name and number for patient to discuss with MD at his appointment tomorrow.  TA:   Wellzone using storage 3 tier shelf (pulling 25# KB from top shelf with one hand gripping and the other underneath KB) and lifting from top shelf to adjacent top of cart and back up to top shelf- 3 sets of 10 with brief rest break between each set.   KB lift from 2nd shelf to top with LLE gripping KB - 3 x 10.   Cart push in wellzone - applying 95 # of ankle weights and approx 40-50 lb punching bag on top along with 25# KB for total of 150 # +  - pushing 30 feet and back x 10   Farmers carry 25 # KB in LUE- x 60 feet  (patient reported as hard)  Farmers carry 15# KB in LUE x 120 feet (hard)  Farmers carry 15# KB in LUE x 180 feet (hard) no complete loss of grip but close.   PT provided CGA to supervision assist throughout session unless otherwise noted throughout session.     PATIENT EDUCATION: Education details:  No longer requires [place].   Pt has made remarkable toward ADL based goals, however a massive gap remains between activities he has done here in clinic and what he will need to do to prepare for return to work duties.  Person educated: Patient Education method: Explanation, Demonstration, and Handouts Education comprehension: verbalized understanding and returned demonstration  HOME EXERCISE PROGRAM: Access Code: [email or web address]  URL: [email or web address]  Date: 02/17/2024  Prepared by: Darryle Patten  Exercises: - Seated March  - 1 x daily - 3 x weekly - 3 sets - 10 reps  - Standing March with Counter Support  - 1 x daily - 3 x weekly - 3 sets - 10 reps  - Mini Squat with Counter Support  - 1 x daily - 3 x weekly - 3 sets - 10 reps  - Heel Toe Raises with Counter Support  - 1 x daily - 3 x weekly - 3 sets - 10 reps   GOALS: Goals reviewed with patient? Yes  SHORT TERM GOALS: Target date: 06/04/24  Patient will be independent in home  exercise program to improve strength/mobility for better functional independence with ADL. Baseline: HEP reviewed during eval Goal status: ACHIEVED  LONG TERM GOALS: Target date: 07/08/24  Patient will increase their Stroke Impact Scale score by >10 points for improved function and independence Baseline: 45/80 8/13: 70/80 Goal status: MET   2.  Patient (< 36 years old) will complete five times sit to stand test in < 10 seconds indicating an increased LE strength and improved balance. Baseline: 17.9 seconds with UE support; 03/29/24: 11.7 hands free; 06/08/2024=11.2 sec without UE support  Goal status: progress made, goal not met   3.  Patient will increase 10 meter walk test to >0.8 m/s as to improve gait speed for better community ambulation and to reduce fall risk. Baseline: 0.56 m/s with 5TT & 0.73 m/s with no AD ; 8/12: 1.55m/s  Goal status: ACHIEVED  4.  Patient will increase Berg Balance score by > 6 points to demonstrate decreased fall risk during functional activities. Baseline: 33/56; 03/30/24: 54/56 Goal status: ACHIEVED,    5.  Patient will increase Functional Gait Assessment score to >20/30 as to  reduce fall risk and improve dynamic gait safety with community ambulation. Baseline:18/30 on 02/25/24; 03/30/24: 27/30 Goal status: ACHIEVED  6.  Patient will increase six minute walk test distance to > 1519ft ft for progression to limited community Ambulator and improved independence with access to community and occupation  Baseline: 7100ft with 4WW on 02/25/24; 03/11/24: 922ft c SPC(MET >54ft improvement); 06/08/2024= 1200 feet Goal status: MET: adjusted. NEW.   7. Pt will increase SLS to > 20 sec bil to improve safety with ADLs and IADLs including showering, shopping, and work related tasks.   Baseline: 8/13: 4 sec on the R, 3 sec on the L;   06/08/2024= 17 on R; 20 sec on L  Goal status: Progressing  ASSESSMENT:  CLINICAL IMPRESSION:  Patient arrived with excellent motivation  today. Treatment focused on work hardening activities including lifting heavy objects that challenged his cardiovascular function and grip strength. He struggled with lifting 25 lb but was able to perform. This would be difficult to perform repetitively in work environment currently. He was able to push sled and work on grip endurance and functional mobility well today- rating activities as hard but able to accomplish and complete tasks.  Pt will benefit from continued therapy in order to address his impairments, and improve his ability to return to work-related activities, and to improve his overall quality of life.  OBJECTIVE IMPAIRMENTS: Abnormal gait, decreased activity tolerance, decreased balance, decreased coordination, decreased endurance, decreased mobility, difficulty walking, decreased ROM, decreased strength, hypomobility, impaired perceived functional ability, impaired flexibility, impaired UE functional use, improper body mechanics, and postural dysfunction.   ACTIVITY LIMITATIONS: carrying, lifting, bending, sitting, standing, squatting, stairs, transfers, bathing, reach over head, hygiene/grooming, and locomotion level  PARTICIPATION LIMITATIONS: meal prep, cleaning, laundry, driving, shopping, community activity, occupation, and yard work  PERSONAL FACTORS: Age and Fitness are also affecting patient's functional outcome.   REHAB POTENTIAL: Good  CLINICAL DECISION MAKING: Evolving/moderate complexity  EVALUATION COMPLEXITY: Moderate  PLAN:  PT FREQUENCY: 2x/week  PT DURATION: 12 weeks  PLANNED INTERVENTIONS: 97164- PT Re-evaluation, 97750- Physical Performance Testing, 97110-Therapeutic exercises, 97530- Therapeutic activity, [id number]- Neuromuscular re-education, 97535- Self Care, [id number]- Manual therapy, [id number]- Gait training, [id number]- Orthotic Initial, [id number]- Orthotic/Prosthetic subsequent, [id number]- Canalith repositioning, [id number]- Splinting, [id number]- Electrical stimulation (manual), [id number]-  Ultrasound, [id number]- Traction (mechanical), [phone] (1-2 muscles), [id number] (3+ muscles)- Dry Needling, Patient/Family education, Balance training, Stair training, Taping, Joint mobilization, Spinal mobilization, Vestibular training, Visual/preceptual remediation/compensation, Cognitive remediation, DME instructions, Cryotherapy, and Moist heat  PLAN FOR NEXT SESSION:   Work ready activities- lifting, progressive LE strengthening- gym based activities as well.   Reyes LOISE London PT  Physical Therapist- [facility]   4:01 PM 06/10/24

## 2024-06-11 ENCOUNTER — Encounter: Payer: Self-pay | Admitting: Physical Medicine & Rehabilitation

## 2024-06-11 ENCOUNTER — Encounter: Attending: Registered Nurse | Admitting: Physical Medicine & Rehabilitation

## 2024-06-11 VITALS — BP 115/82 | HR 94 | Ht 71.0 in | Wt 231.0 lb

## 2024-06-11 DIAGNOSIS — I69354 Hemiplegia and hemiparesis following cerebral infarction affecting left non-dominant side: Secondary | ICD-10-CM | POA: Insufficient documentation

## 2024-06-11 DIAGNOSIS — I69398 Other sequelae of cerebral infarction: Secondary | ICD-10-CM | POA: Diagnosis not present

## 2024-06-11 DIAGNOSIS — R269 Unspecified abnormalities of gait and mobility: Secondary | ICD-10-CM | POA: Diagnosis not present

## 2024-06-11 NOTE — Progress Notes (Signed)
 Subjective:    Patient ID: Mark Mcintosh, male    DOB: May 06, 1971, 53 y.o.   MRN: [id number] 53 y.o. right-handed male with history significant for bilateral conjunctival inflammation hypertension as well as tobacco use and class II morbid obesity with BMI 36.26.  Per chart review patient lives alone independent prior to admission.  Presented to [facility] 01/09/2024 with acute onset of left-sided weakness.  MRI showed acute infarct in the posterior limb of the right internal capsule, the overlying right frontal white matter and pons.  Multiple remote infarcts and chronic microvascular ischemic disease.  CTA showed no large vessel occlusion.  Admission chemistries unremarkable except potassium 3.1.  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  TEE with small PFO per cardiology services with no plan for closure.  Neurology follow-up placed on aspirin  and Plavix  for CVA prophylaxis x 3 weeks then aspirin  alone.  Lovenox  for DVT prophylaxis but bilateral Doppler studies negative.  Therapy evaluations completed due to patient decreased functional mobility left-sided weakness was admitted for a comprehensive rehab program.    Admit date: 01/14/2024 Discharge date: 01/30/2024 HPI Discussed the use of AI scribe software for clinical note transcription with the patient, who gave verbal consent to proceed.  History of Present Illness Mark Mcintosh is a 53 year old male with a history of stroke who presents for evaluation of work readiness and disability extension.  He is seeking an extension of his disability leave due to ongoing physical limitations following a stroke that occurred approximately five months ago. 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 His primary doctor had previously cleared him to return to work with restrictions, including not  lifting over 20 pounds or above his head, but he has not yet received confirmation from his employer about accommodating these restrictions.  He reports ongoing balance issues and a recent fall at home while vacuuming, which resulted in a broken left little toe that required a cast for six weeks. He continues to experience difficulties with fine motor skills, such as buttoning, and notes that his left hand grip strength has improved from 40 to 57, although it was previously 100. His left leg becomes shaky after walking 1200 feet, short of the 1500 feet goal in six minutes.  He is actively seeking less physically demanding employment and has been cleared to start driving again. 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 He is currently undergoing physical therapy twice a week and has therapy scheduled through January. He reports improvements in therapy, but still experiences some difficulty with fine motor skills on the left side.  No significant swelling in hands, although he feels tightness when making a fist. Sensation and temperature perception are normal on both sides.    Pain Inventory Average Pain 0 Pain Right Now 0 My pain is no pain  LOCATION OF PAIN  Weakness: Left arm, left leg, left hand, left foot   BOWEL Number of stools per week: 7 Oral laxative use No    BLADDER Normal   Mobility walk without assistance do you drive?  no Do you have any goals in this area?  yes  Function employed # of hrs/week on medical leave Do you have any goals in this area?  yes  Neuro/Psych weakness tremor anxiety  Prior Studies Any changes since last visit?  no  Physicians involved in your care Any changes since last visit?  no   Family History  Problem Relation Age of Onset   Diabetes Mother    Hypertension Mother    Heart failure Maternal Grandmother    Social History   Socioeconomic History   Marital status: Single    Spouse name: Not  on file   Number of children: Not on file   Years of education: Not on file   Highest education level: GED or equivalent  Occupational History   Not on file  Tobacco Use   Smoking status: Every Day    Current packs/day: 0.50    Types: Cigarettes   Smokeless tobacco: Never   Tobacco comments:    Patient currently using a Nicotine  patch and chewing gum to help him stop, states since strokes he went from smoking a pack a day to 5-6.  Vaping Use   Vaping status: Never Used  Substance and Sexual Activity   Alcohol use: Not Currently    Alcohol/week: 2.0 standard drinks of alcohol    Types: 2 Cans of beer per week   Drug use: Not Currently    Types: Marijuana   Sexual activity: Not Currently  Other Topics Concern   Not on file  Social History Narrative   Not on file   Social Drivers of Health   Financial Resource Strain: Medium Risk (04/13/2024)   Overall Financial Resource Strain (CARDIA)    Difficulty of Paying Living Expenses: Somewhat hard  Food Insecurity: No Food Insecurity (04/13/2024)   Hunger Vital Sign    Worried About Running Out of Food in the Last Year: Never true    Ran Out of Food in the Last Year: Never true  Transportation Needs: Unmet Transportation Needs (04/13/2024)   PRAPARE - Administrator, Civil Service (Medical): Yes    Lack of Transportation (Non-Medical): No  Physical Activity: Insufficiently Active (04/13/2024)   Exercise Vital Sign    Days of Exercise per Week: 3 days    Minutes of Exercise per Session: 20 min  Stress: Stress Concern Present (04/13/2024)   [employer] of Occupational Health - Occupational Stress Questionnaire    Feeling of Stress: To some extent  Social Connections: Moderately Isolated (04/13/2024)   Social Connection and Isolation Panel    Frequency of Communication with Friends and Family: More than three times a week    Frequency of Social Gatherings with Friends and Family: Twice a week    Attends Religious  Services: 1 to 4 times per year    Active Member of [employer] or Organizations: No    Attends Engineer, structural: Not on file    Marital Status: Divorced   Past Surgical History:  Procedure Laterality Date   BREAST BIOPSY Right 03/15/2024   US  RT BREAST BX W LOC DEV 1ST LESION IMG BX SPEC US  GUIDE 03/15/2024 [facility]   TEE WITHOUT CARDIOVERSION N/A 01/14/2024   Procedure: ECHOCARDIOGRAM, TRANSESOPHAGEAL;  Surgeon: Alluri, Keller BROCKS, MD;  Location: [facility] ORS;  Service: Cardiovascular;  Laterality: N/A;   Past Medical History:  Diagnosis Date   Acute CVA (cerebrovascular accident) (HCC) 01/09/2024   Arthritis    Hypertension    Stroke (HCC) 01/09/2024   Stated he had 2 strokes.   Ht 5' 11 (1.803 m)   Wt 231 lb (104.8 kg)   BMI 32.22 kg/m   Opioid Risk Score:   Fall Risk Score:  `1  Depression screen [facility] 2/9  06/11/2024   11:36 AM 03/26/2024   11:11 AM 02/27/2024    9:11 AM 02/11/2024    8:28 AM  Depression screen PHQ 2/9  Decreased Interest 0 0 1 0  Down, Depressed, Hopeless 0 0 1 0  PHQ - 2 Score 0 0 2 0  Altered sleeping  1 1   Tired, decreased energy  0 2   Change in appetite  0 1   Feeling bad or failure about yourself   0 1   Trouble concentrating  1 2   Moving slowly or fidgety/restless  0 1   Suicidal thoughts  0 0   PHQ-9 Score  2 10   Difficult doing work/chores  Somewhat difficult Not difficult at all     Review of Systems  Eyes:  Positive for photophobia.  Neurological:  Positive for tremors and weakness.  Psychiatric/Behavioral:         Anxiety   All other systems reviewed and are negative.      Objective:   Physical Exam  Neuro:  Eyes without evidence of nystagmus  Tone is normal without evidence of spasticity Cerebellar exam mild dysmetria left finger-nose-finger Unable to perform tandem gait  Motor strength is 5/5 in right and 4/5 left deltoid, biceps, triceps, finger flexors and extensors, wrist flexors and extensors, hip  flexors, knee flexors and extensors, ankle dorsiflexors, plantar flexors, invertors and evertors, toe flexors and extensors  Sensory exam is normal to  light touch in the upper and lower limbs   Cranial nerves II- Visual fields are intact to confrontation testing, no blurring of vision III- no evidence of ptosis, upward, downward and medial gaze intact IV- no vertical diplopia or head tilt V- no facial numbness or masseter weakness VI- no pupil abduction weakness VII- no facial droop, good lid closure VII- normal auditory acuity IX- no pharygeal weakness, gag nl X- no pharyngeal weakness, no hoarseness XI- no trap or SCM weakness XII- no glossal weakness        Assessment & Plan:  Assessment and Plan Assessment & Plan Right cerebrovascular accident with left hemiparesis Five months post-stroke with persistent left hemiparesis, balance issues, fine motor difficulties, and mild dysmetria. Recent fall led to a healed fracture of the left little toe. Grip strength improved but below baseline. Limited walking endurance. Unable to perform job duties requiring lifting and prolonged standing. Prognosis for significant further recovery is limited. Discussed limited potential for further recovery and advised consideration of alternative employment. - Continue physical therapy until January. - Re-evaluate work readiness at the end of January. - Consider functional capacity evaluation after therapy completion. - Complete disability forms to extend work leave until re-evaluation. - Provide a letter to the employer regarding work restrictions and re-evaluation timeline.

## 2024-06-11 NOTE — Patient Instructions (Signed)
  VISIT SUMMARY: You visited today to discuss your work readiness and the extension of your disability leave following your stroke five months ago. We reviewed your ongoing physical limitations and your progress in physical therapy.  YOUR PLAN: RIGHT CEREBROVASCULAR ACCIDENT WITH LEFT HEMIPARESIS: You are experiencing persistent left-sided weakness, balance issues, and difficulties with fine motor skills following your stroke. -Continue physical therapy until January. -We will re-evaluate your work readiness at the end of January. -Consider a functional capacity evaluation after you complete your therapy. -We will complete the necessary disability forms to extend your work leave until your re-evaluation. -A letter will be provided to your employer regarding your work restrictions and the re-evaluation timeline.                      Contains text generated by [employer]bridge.                                 Contains text generated by Abridge.                          Contains text generated by Abridge.                                 Contains text generated by [employer]bridge.

## 2024-06-15 ENCOUNTER — Ambulatory Visit

## 2024-06-15 ENCOUNTER — Encounter: Admitting: Speech Pathology

## 2024-06-15 DIAGNOSIS — I639 Cerebral infarction, unspecified: Secondary | ICD-10-CM

## 2024-06-15 DIAGNOSIS — M6281 Muscle weakness (generalized): Secondary | ICD-10-CM | POA: Diagnosis not present

## 2024-06-15 DIAGNOSIS — R278 Other lack of coordination: Secondary | ICD-10-CM

## 2024-06-15 DIAGNOSIS — R471 Dysarthria and anarthria: Secondary | ICD-10-CM

## 2024-06-15 DIAGNOSIS — R2689 Other abnormalities of gait and mobility: Secondary | ICD-10-CM

## 2024-06-15 DIAGNOSIS — R262 Difficulty in walking, not elsewhere classified: Secondary | ICD-10-CM

## 2024-06-15 DIAGNOSIS — R2681 Unsteadiness on feet: Secondary | ICD-10-CM

## 2024-06-15 NOTE — Therapy (Signed)
 OUTPATIENT PHYSICAL THERAPY TREATMENT   Patient Name: Neils Siracusa MRN: [id number] DOB:[birth year], 53 y.o., male Today's Date: 06/16/2024  PCP: None REFERRING PROVIDER: [name], PA-C   PT End of Session - 06/15/24 1456     Visit Number 22    Number of Visits 31    Date for Recertification  07/08/24    Progress Note Due on Visit 30    PT Start Time 1446    PT Stop Time 1528    PT Time Calculation (min) 42 min    Equipment Utilized During Treatment Gait belt    Activity Tolerance Patient tolerated treatment well;No increased pain    Behavior During Therapy [facility] for tasks assessed/performed               Past Medical History:  Diagnosis Date   Acute CVA (cerebrovascular accident) (HCC) 01/09/2024   Arthritis    Hypertension    Stroke (HCC) 01/09/2024   Stated he had 2 strokes.   Past Surgical History:  Procedure Laterality Date   BREAST BIOPSY Right 03/15/2024   US  RT BREAST BX W LOC DEV 1ST LESION IMG BX SPEC US  GUIDE 03/15/2024 [facility]   TEE WITHOUT CARDIOVERSION N/A 01/14/2024   Procedure: ECHOCARDIOGRAM, TRANSESOPHAGEAL;  Surgeon: Alluri, Keller BROCKS, MD;  Location: [facility] ORS;  Service: Cardiovascular;  Laterality: N/A;   Patient Active Problem List   Diagnosis Date Noted   Gait disturbance, post-stroke 06/11/2024   Hemiparesis affecting left side as late effect of cerebrovascular accident (HCC) 04/01/2024   Mass of lower outer quadrant of right breast 02/27/2024   History of cerebrovascular accident (CVA) with residual deficit 02/27/2024   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) 02/27/2024   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) 02/27/2024   Gait abnormality 02/27/2024   Left-sided weakness 02/27/2024   Prediabetes 02/27/2024   Abnormal pulse oximetry 02/27/2024   Vitamin D  deficiency 02/27/2024   Bradycardia 02/27/2024   Depression, major, single episode, moderate (HCC) 02/27/2024    Coping style affecting medical condition 01/20/2024   Tobacco use disorder 01/09/2024   Obesity (BMI 30-39.9) 01/09/2024   Hypertension     ONSET DATE: 01/09/24  REFERRING DIAG: I63.9 (ICD-10-CM) - Acute CVA (cerebrovascular accident) (HCC)   THERAPY DIAG:  Muscle weakness (generalized)  Other lack of coordination  Acute CVA (cerebrovascular accident) (HCC)  Difficulty in walking, not elsewhere classified  Unsteadiness on feet  Other abnormalities of gait and mobility  Dysarthria and anarthria  Rationale for Evaluation and Treatment: Rehabilitation  SUBJECTIVE:  SUBJECTIVE STATEMENT:  Pt reports his MD extended his time out and wants him to continue with PT to try to push him and then will re-evaluate work appropriateness at End of January.    PERTINENT HISTORY: [facility] ED 01/09/24 for progressively worsening L sided-weakness. multiple acute infarcts concerning for embolic showering. PMH includes arthritis, HTN. 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   PAIN:  Are you having pain? No  PRECAUTIONS: None  WEIGHT BEARING RESTRICTIONS: No  FALLS: Has patient fallen in last 6 months? Yes. Number of falls 1- pt reports that he tried to get up at home after returning from [place] and he got up too fast and fell. Pt also reports stumbling on steps at home  LIVING ENVIRONMENT: Lives with: lives alone- pt's son was staying with him until this past Friday- son lives in [place], pt has friends that come by every other day Lives in: House/apartment Stairs: Yes: External: 4 steps; can reach both Has following equipment at home: Walker - 4 wheeled and shower chair  PLOF: Independent- pt works with mixing chemicals and  dyes  PATIENT GOALS: Pt wants to get back to normal and be able to return to work  OBJECTIVE:  Note: Objective measures were completed at Evaluation unless otherwise noted.  DIAGNOSTIC FINDINGS: via chart  From 01/09/24 MRI Brain W/O Contrast: IMPRESSION: 1. Acute infarcts in the posterior limb of the right internal capsule, the overlying right frontal white matter, and pons. Given involvement of multiple vascular territories, consider an embolic etiology. 2. Multiple remote infarcts and chronic microvascular ischemic disease, detailed above.  From 01/10/24 CT Angio Head Neck W W/O CM: IMPRESSION: 1. No acute intracranial hemorrhage or mass effect. 2. No large vessel occlusion, hemodynamically significant stenosis, or aneurysm in the head or neck. 3. Remote lacunar infarcts in the genu of the left internal capsule, globus pallidus, and right corona radiata. 4. Acute infarct involving the posterior limb of the right internal capsule is less well appreciated at CT. 5. Moderately advanced periventricular white matter disease for age.   MMT:  MMT Right Eval Left Eval  Hip flexion 4- 3+  Hip extension      Hip abduction 5 5  Hip adduction 4+ 4+  Hip internal rotation      Hip external rotation      Knee flexion 5 4+  Knee extension 5 4+  Ankle dorsiflexion 5 4-  Ankle plantarflexion 5 4-  Ankle inversion      Ankle eversion      (Blank rows = not tested)  FUNCTIONAL TESTS (at eval):  5 times sit-to-stand: 17.9 seconds, pt utilizing arms on knees to assist with standing : 0.56 m/s with 4WW and CGA; 0.73 m/s with no AD and CGA BERG Balance Scale: 33/56 FGA: 18/30 on 02/25/24 : 795 ft with 4WW on 02/25/24  PATIENT SURVEYS (eval):  Stroke Impact Scale: 45/80  TREATMENT DATE: 06/15/24  TE: Leg press- 40 x 12 reps BLE; 50 # x 12 reps BLE; 70  2 x 10 reps BLE   TA:   Bwd/fwd walking (gripping bat handle - Matrix cable) at 17.5# - x 5 with RUE and then 5 with LUE- Difficulty with grip on LUE but no LOB  Farmers carry progressive ladder walking (10 and back, 20 and back, and 33, and 43 feet  (BORG- 16 at the end)   Front step up onto 12 in block with 6# AW without any UE support 2 sets x 15 reps  (BORG- 14)    PT provided CGA to supervision assist throughout session unless otherwise noted throughout session.     PATIENT EDUCATION: Education details:  No longer requires [place].   Pt has made remarkable toward ADL based goals, however a massive gap remains between activities he has done here in clinic and what he will need to do to prepare for return to work duties.  Person educated: Patient Education method: Explanation, Demonstration, and Handouts Education comprehension: verbalized understanding and returned demonstration  HOME EXERCISE PROGRAM: Access Code: [email or web address]  URL: [email or web address]  Date: 02/17/2024  Prepared by: Darryle Patten  Exercises: - Seated March  - 1 x daily - 3 x weekly - 3 sets - 10 reps  - Standing March with Counter Support  - 1 x daily - 3 x weekly - 3 sets - 10 reps  - Mini Squat with Counter Support  - 1 x daily - 3 x weekly - 3 sets - 10 reps  - Heel Toe Raises with Counter Support  - 1 x daily - 3 x weekly - 3 sets - 10 reps   GOALS: Goals reviewed with patient? Yes  SHORT TERM GOALS: Target date: 06/04/24  Patient will be independent in home exercise program to improve strength/mobility for better functional independence with ADL. Baseline: HEP reviewed during eval Goal status: ACHIEVED  LONG TERM GOALS: Target date: 07/08/24  Patient will increase their Stroke Impact Scale score by >10 points for improved function and independence Baseline: 45/80 8/13: 70/80 Goal status: MET   2.  Patient (< 59 years old) will complete five times sit to stand test in < 10  seconds indicating an increased LE strength and improved balance. Baseline: 17.9 seconds with UE support; 03/29/24: 11.7 hands free; 06/08/2024=11.2 sec without UE support  Goal status: progress made, goal not met   3.  Patient will increase 10 meter walk test to >0.8 m/s as to improve gait speed for better community ambulation and to reduce fall risk. Baseline: 0.56 m/s with 5TT & 0.73 m/s with no AD ; 8/12: 1.62m/s  Goal status: ACHIEVED  4.  Patient will increase Berg Balance score by > 6 points to demonstrate decreased fall risk during functional activities. Baseline: 33/56; 03/30/24: 54/56 Goal status: ACHIEVED,    5.  Patient will increase Functional Gait Assessment score to >20/30 as to reduce fall risk and improve dynamic gait safety with community ambulation. Baseline:18/30 on 02/25/24; 03/30/24: 27/30 Goal status: ACHIEVED  6.  Patient will increase six minute walk test distance to > 1556ft ft for progression to limited community Ambulator and improved independence with access to community and occupation  Baseline: 770ft with 4WW on 02/25/24; 03/11/24: 938ft c SPC(MET >51ft improvement); 06/08/2024= 1200 feet Goal status: MET: adjusted. NEW.   7. Pt will increase SLS to > 20 sec bil to improve safety with ADLs and IADLs including showering, shopping,  and work related tasks.   Baseline: 8/13: 4 sec on the R, 3 sec on the L;   06/08/2024= 17 on R; 20 sec on L  Goal status: Progressing  ASSESSMENT:  CLINICAL IMPRESSION:   Treatment focused on work hardening activities including gripping  heavy objects with prolonged mobility that challenged his cardiovascular function and grip strength. He was challenged with increased distance and demonstrated more fatigue with UE than LE overall today. No LOB and plan will be to continue with work hardening activities- pushing, pulling, lifting, and cardiovascular activities. Pt will benefit from continued therapy in order to address his impairments, and  improve his ability to return to work-related activities, and to improve his overall quality of life.  OBJECTIVE IMPAIRMENTS: Abnormal gait, decreased activity tolerance, decreased balance, decreased coordination, decreased endurance, decreased mobility, difficulty walking, decreased ROM, decreased strength, hypomobility, impaired perceived functional ability, impaired flexibility, impaired UE functional use, improper body mechanics, and postural dysfunction.   ACTIVITY LIMITATIONS: carrying, lifting, bending, sitting, standing, squatting, stairs, transfers, bathing, reach over head, hygiene/grooming, and locomotion level  PARTICIPATION LIMITATIONS: meal prep, cleaning, laundry, driving, shopping, community activity, occupation, and yard work  PERSONAL FACTORS: Age and Fitness are also affecting patient's functional outcome.   REHAB POTENTIAL: Good  CLINICAL DECISION MAKING: Evolving/moderate complexity  EVALUATION COMPLEXITY: Moderate  PLAN:  PT FREQUENCY: 2x/week  PT DURATION: 12 weeks  PLANNED INTERVENTIONS: [id number]- PT Re-evaluation, 97750- Physical Performance Testing, 97110-Therapeutic exercises, 97530- Therapeutic activity, [id number]- Neuromuscular re-education, 97535- Self Care, [id number]- Manual therapy, [id number]- Gait training, [id number]- Orthotic Initial, [id number]- Orthotic/Prosthetic subsequent, [phone]- Canalith repositioning, [id number]- Splinting, [id number]- Electrical stimulation (manual), [id number]- Ultrasound, [id number]- Traction (mechanical), [phone] (1-2 muscles), [id number] (3+ muscles)- Dry Needling, Patient/Family education, Balance training, Stair training, Taping, Joint mobilization, Spinal mobilization, Vestibular training, Visual/preceptual remediation/compensation, Cognitive remediation, DME instructions, Cryotherapy, and Moist heat  PLAN FOR NEXT SESSION:   Work ready activities- lifting, progressive LE strengthening- gym based activities as well.   Reyes LOISE London PT  Physical Therapist- [facility]   1:15 PM 06/16/24

## 2024-06-16 ENCOUNTER — Telehealth: Payer: Self-pay | Admitting: *Deleted

## 2024-06-16 NOTE — Telephone Encounter (Signed)
 Pt saw Dr Carilyn on Friday. He reports that disability needs medical records to be faxed to them (# [phone])

## 2024-06-17 ENCOUNTER — Ambulatory Visit

## 2024-06-17 ENCOUNTER — Encounter: Admitting: Speech Pathology

## 2024-06-17 DIAGNOSIS — I639 Cerebral infarction, unspecified: Secondary | ICD-10-CM

## 2024-06-17 DIAGNOSIS — M6281 Muscle weakness (generalized): Secondary | ICD-10-CM

## 2024-06-17 DIAGNOSIS — R2689 Other abnormalities of gait and mobility: Secondary | ICD-10-CM

## 2024-06-17 DIAGNOSIS — R2681 Unsteadiness on feet: Secondary | ICD-10-CM

## 2024-06-17 DIAGNOSIS — R278 Other lack of coordination: Secondary | ICD-10-CM

## 2024-06-17 DIAGNOSIS — R262 Difficulty in walking, not elsewhere classified: Secondary | ICD-10-CM

## 2024-06-17 NOTE — Therapy (Signed)
 OUTPATIENT OCCUPATIONAL THERAPY NEURO TREATMENT NOTE  Patient Name: Mark Mcintosh MRN: [id number] DOB:10/31/1970, 53 y.o., male Today's Date: 06/17/2024  PCP: No PCP REFERRING PROVIDER: [name], PA-C  END OF SESSION:   OT End of Session - 06/17/24 1949     Visit Number 27    Number of Visits 41    Date for Recertification  08/05/24    Progress Note Due on Visit 30    OT Start Time 1410    OT Stop Time 1445    OT Time Calculation (min) 35 min    Activity Tolerance Patient tolerated treatment well    Behavior During Therapy [facility] for tasks assessed/performed         Past Medical History:  Diagnosis Date   Acute CVA (cerebrovascular accident) (HCC) 01/09/2024   Arthritis    Hypertension    Stroke (HCC) 01/09/2024   Stated he had 2 strokes.   Past Surgical History:  Procedure Laterality Date   BREAST BIOPSY Right 03/15/2024   US  RT BREAST BX W LOC DEV 1ST LESION IMG BX SPEC US  GUIDE 03/15/2024 [facility]   TEE WITHOUT CARDIOVERSION N/A 01/14/2024   Procedure: ECHOCARDIOGRAM, TRANSESOPHAGEAL;  Surgeon: Alluri, Keller BROCKS, MD;  Location: [facility] ORS;  Service: Cardiovascular;  Laterality: N/A;   Patient Active Problem List   Diagnosis Date Noted   Gait disturbance, post-stroke 06/11/2024   Hemiparesis affecting left side as late effect of cerebrovascular accident (HCC) 04/01/2024   Mass of lower outer quadrant of right breast 02/27/2024   History of cerebrovascular accident (CVA) with residual deficit 02/27/2024   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) 02/27/2024   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) 02/27/2024   Gait abnormality 02/27/2024   Left-sided weakness 02/27/2024   Prediabetes 02/27/2024   Abnormal pulse oximetry 02/27/2024   Vitamin D  deficiency 02/27/2024   Bradycardia 02/27/2024   Depression, major, single episode, moderate (HCC) 02/27/2024   Coping style affecting medical condition  01/20/2024   Tobacco use disorder 01/09/2024   Obesity (BMI 30-39.9) 01/09/2024   Hypertension    ONSET DATE: 01/09/24  REFERRING DIAG: I63.9 (ICD-10-CM) - Acute CVA (cerebrovascular accident) (HCC)   THERAPY DIAG:  Muscle weakness (generalized)  Other lack of coordination  Acute CVA (cerebrovascular accident) (HCC)  Rationale for Evaluation and Treatment: Rehabilitation  SUBJECTIVE:  SUBJECTIVE STATEMENT: Pt reported transportation delay; shortened tx session d/t late arrival beyond pt's control.  Pt accompanied by: self  PERTINENT HISTORY: Per chart:  Mark Mcintosh is a 53 y.o. right-handed male with history significant for bilateral conjunctival inflammation hypertension as well as tobacco use and class II morbid obesity with BMI 36.26.  Per chart review patient lives alone independent prior to admission.  Presented to [facility] 01/09/2024 with acute onset of left-sided weakness.  MRI showed acute infarct in the posterior limb of the right internal capsule, the overlying right frontal white matter and pons.  Multiple remote infarcts and chronic microvascular ischemic disease.  CTA showed no large vessel occlusion.  Admission chemistries unremarkable except potassium 3.1.  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  TEE with small PFO per cardiology services with no plan for closure.  Neurology follow-up placed on aspirin  and Plavix  for CVA prophylaxis x 3 weeks then aspirin  alone.  Lovenox  for DVT prophylaxis but bilateral Doppler studies negative.  Therapy evaluations completed due to patient decreased functional mobility left-sided weakness was admitted for a comprehensive rehab program.   PRECAUTIONS: Fall  WEIGHT BEARING  RESTRICTIONS: No  PAIN: 06/17/24: No pain Are you having pain? 0/10  FALLS: Has patient fallen in last 6 months? Yes. 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  Get back my independence.    OBJECTIVE:  Note: Objective measures were completed at Evaluation unless otherwise noted.  HAND DOMINANCE: Right  ADLs: Overall ADLs: Son was staying with pt for about a week after d/c from inpatient rehab, but has since returned home.  Friend comes by every other day to check in.   Transfers/ambulation related to ADLs: modified indep with rollator Eating: increased difficulty with cutting food  Grooming: increased difficulty with clipping nails, otherwise manages fine with dominant/unaffected hand UB Dressing: increased time with clothing fasteners LB Dressing: increased time with clothing fasteners  Toileting: increased time to engage L hand into clothing management Bathing: pt sits on shower chair to wash LEs, some SOB with standing in shower Tub Shower transfers: Modified indep Equipment: Shower seat without back  IADLs: Shopping: motorized cart, can push cart for shorter shopping trips.  Light housekeeping: extra time and cautions to avoid falls, sometimes feels SOB with laundry  Meal Prep: friend helps with stove top cooking, pt can do light hot and cold meal prep  Community mobility: rollator, friend drove pt  Medication management: indep  Landscape architect: indep  Handwriting: NT; L non-dominant hand affected   MOBILITY STATUS: Hx of falls, rollator or funiture/wall walking in the home, rollator for community  POSTURE COMMENTS:  L sided hemiparesis   ACTIVITY TOLERANCE: Activity tolerance: Pt reports some dyspnea with prolonged standing/mobility  UPPER EXTREMITY ROM:  BUEs WFL  UPPER EXTREMITY MMT:     MMT Right eval Left eval Left 03/31/24 Left 05/13/24   Shoulder flexion 5 4- 4- 4+  Shoulder abduction 5 4- 4- 4+  Shoulder adduction      Shoulder extension      Shoulder internal rotation   4- 4+  Shoulder external rotation   3+ 4  Middle trapezius      Lower trapezius      Elbow flexion 5 4- 4+ 5  Elbow extension 5 4- 4+ 5  Wrist flexion 5 4- 4+ 5  Wrist extension 5 4- 4 4+  Wrist ulnar deviation      Wrist radial deviation      Wrist pronation 5 4- 4+ 4+  Wrist supination 5 4- 4+ 4+  (Blank rows = not tested)  HAND FUNCTION: Eval: Grip strength: Right: 111 lbs; Left: 51 lbs, Lateral pinch: Right: 23 lbs, Left: 13 lbs, and 3 point pinch: Right: 26 lbs, Left: 13 lbs 03/31/24: Grip strength: Right: 101 lbs, Left: 31 lbs, lateral pinch: Right: 23 lbs, Left: 13 lbs, 3 point pinch: Right: 25 lbs, Left: 14 lbs  04/06/24: Grip strength: Left: 32 lbs 04/13/24: Grip strength: Left: 45 lbs  05/04/24: Grip strength: Left: 24 lbs before stretching hand; 40 lbs after stretching; Lateral pinch: Left: 15 lbs, 3 point pinch: Left: 14 lbs  05/13/24: Grip strength: Left: 44 lbs; Lateral pinch: Left: 16 lbs, 3 point pinch:  Left: 13 lbs  05/25/24: Grip strength: Left: 44 lbs, Lateral pinch: Left: 16 lbs, 3 point pinch: Left: 11 lbs (measured on [place]; previously measured on standard) 06/08/24: Grip strength: Left: 46 lbs, 44 lbs, 45 lbs (3 trials) 06/10/24: Grip strength: eft: 47#, 53#, 51# (3 trials)  COORDINATION: Finger Nose Finger test: increased time and decreased accuracy on the L  9 Hole Peg test: Right: 25 sec; Left: 1 min 27 sec 03/31/24: L 52 sec, 1 min 4 sec, 39 sec  05/13/24: 43 sec  05/25/24: 36 sec (after multiple trials)   05/13/24: Finger knee to nose 5x: R: 5 sec, L: 6 sec, mild dysmetria 05/25/24: L 6 sec, mild dysmetria   SENSATION: Pt reports increased tingling/numbness when hand balls up a little, but when he straightens it out it goes back to normal.   EDEMA: no visible edema   MUSCLE TONE: LUE: Mild   COGNITION: Overall  cognitive status: Within functional limits for tasks assessed pt reports difficulties with memory   VISION:             Subjective report: Wears glasses all the time.  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                                                                                                             TREATMENT DATE: 06/17/24 Therapeutic Exercise: -BUE strengthening: Sci fit x6 min, level 4 resistance (mod), alternating between forward and reverse rotations -L grip strengthening: Hand gripper set at mod resistance with 3 red bands: 3 sets 15 reps -L wrist strengthening with 4 lb dumbbell: wrist ext, flex, RD/UD, forearm pron/sup x2 sets 12 reps each  Neuro re-ed: -Facilitated LUE FMC/dexterity skills working on environmental health practitioner, including gathering washers 1 by 1 from magnetic dish, removing from a horizontal dowel, storing up to 3 in hand, and discarding 1 by 1 moving washers from palm to fingertips. -Facilitated LUE GMC working to place washers over vertical dowel; dowel position alternated to promote forward and lateral reaching patterns at shoulder height.  Given vc to reduce substitution patterns to challenge reaching toward a small target.  -Facilitated L hand FMC/dexterity skills working to place grooved pegs into pegboard; min vc to reduce substitution patterns  PATIENT EDUCATION: Education details:  LUE strengthening/coordination training   Person educated: Patient Education method: Verbal cues Education comprehension: verbalized  understanding, returned demonstration, and verbal cues required  HOME EXERCISE PROGRAM: Green theraputty 2-3x per day for 5-10 min periods for L hand grip strengthening, [facility] handout Tendon glides, passive stretching to the L wrist/digits, L hand AROM  GOALS: Goals reviewed with patient? Yes  SHORT TERM GOALS: Target date: 03/30/24  Pt will be indep to perform HEP for increasing strength and coordination throughout the LUE. Baseline: Eval: Not yet initiated; 03/31/24: Indep with HEP and verbalizes understanding of progressions made today for frequency Goal status: achieved  LONG TERM GOALS: Target date: 08/05/24  Pt will increase LUE MMT by 1/2 grade or more to increase engagement of LUE into ADL/IADLs. Baseline: Eval: LUE grossly 4-/5 throughout (R 5/5); 03/31/24: L shoulder grossly 4-, elbow flex 4+, ext 4, forearm pron/sup 4+; 05/13/24: L shoulder 4 to 4+/5, elbow 5/5, wrist 4+ to 5/5 Goal status: achieved  2.  Pt will increase L grip strength by 20 lbs or more to enable pt to grasp and carry heavy ADL supplies in L hand. Baseline: Eval: L 51 lbs (R 111 lbs); 03/31/24: L grip 31 lbs (R 101 lbs); 05/13/24: L grip 44 lbs (edema in L hand has caused fluctuations in hand strength since eval); 05/25/24: L grip 44 lbs Goal status: in progress  3.  Pt will increase L hand [facility] skills as demonstrated by completion of 9 hole peg test in 30 sec or less to improve manipulation of small ADL supplies. Baseline: Eval: L 1 min 27 sec (R 25 sec); 03/31/24: 3 trials completed today: L 52 sec, 1 min 4 sec, 39 sec; 05/13/24: L 43 sec (R 25 sec); 05/25/24: L 36 sec (after several trials) Goal status: in progress  4.  Pt will increase LUE GMC to enable confidence with moving hot pots/pans on/off stove top and in/out of the oven using BUEs. Baseline: Eval: Pt reports that his friend currently assists with heavier meal prep; 03/31/24: Pt is now cooking independently, but with increased time/caution; 05/13/24: LUE GMC  has greatly improved, though pt still demos mild dysmetria and must use caution when lifting/carrying items noted above; 05/25/24: mild dysmetria/continues to use caution Goal status: in progress  5. Pt will increase tolerance for engaging the LUE into sustained pushing/grasping manuevers to enable push mowing yard in <45 min.  Baseline: Recert 05/13/24: Pt reports he prefers family to mow his lawn as it takes him increased time, estimating 45-60 min (brother mows in  30 min; 05/25/24: Limited mowing since L foot metatarsal fx  Goal status: in progress   6.  Pt will efficiently pick up flat items from table top using L hand.  Baseline: Recert 05/13/24: Pt requires extra time/repeat trials with attempts at picking up coins, flat stones, paper from table top when using  L hand; 05/25/24: extra time for above (see 05/13/24)  Goal status: in progress  ASSESSMENT: CLINICAL IMPRESSION: Shortened tx session d/t late arrival as noted above (pt relies on public transportation).  Pt reported fatigue/perspiration following Scifit, but good ability to maintain speed throughout on level 4 (mod resistance) without rest break.  Pt continues to demonstrate mild-moderate ataxia throughout the LUE, occasionally dropping washers and grooved pegs from L hand.  Pt uses substitution patterns (forearm mobilization instead of isolating fingers) to reduce dropped pegs when moving them from horiz to vertical position when placing into pegboard.  GMC is improving, specifically when reaching toward a small target when forearm is pronated or neutral, but with increased ataxia when reaching with a supinated forearm.  Pt reports still having some difficulty and/or using caution when grasping a pan in the kitchen using his L hand (supinated forearm position).  Pt continues to benefit from skilled OT to work towards above noted goals for improving indep with daily tasks, return to work, to reduce burden of  care on caregivers, and improve  QOL.    PERFORMANCE DEFICITS: in functional skills including ADLs, IADLs, coordination, dexterity, sensation, tone, strength, Fine motor control, Gross motor control, mobility, balance, body mechanics, endurance, decreased knowledge of precautions, decreased knowledge of use of DME, vision, and UE functional use, cognitive skills including memory, and psychosocial skills including coping strategies, environmental adaptation, and routines and behaviors.   IMPAIRMENTS: are limiting patient from ADLs, IADLs, work, and leisure.   CO-MORBIDITIES: has co-morbidities such as tobacco use disorder, HTN, arthritis that affects occupational performance. Patient will benefit from skilled OT to address above impairments and improve overall function.  MODIFICATION OR ASSISTANCE TO COMPLETE EVALUATION: No modification of tasks or assist necessary to complete an evaluation.  OT OCCUPATIONAL PROFILE AND HISTORY: Detailed assessment: Review of records and additional review of physical, cognitive, psychosocial history related to current functional performance.  CLINICAL DECISION MAKING: Moderate - several treatment options, min-mod task modification necessary  REHAB POTENTIAL: Good  EVALUATION COMPLEXITY: Moderate  PLAN:  OT FREQUENCY: 2x/week  OT DURATION: 12 weeks  PLANNED INTERVENTIONS: 97168 OT Re-evaluation, 97535 self care/ADL training, 02889 therapeutic exercise, 97530 therapeutic activity, 97112 neuromuscular re-education, 97140 manual therapy, 97116 gait training, 02989 moist heat, 97010 cryotherapy, 97750 Physical Performance Testing, passive range of motion, balance training, functional mobility training, visual/perceptual remediation/compensation, psychosocial skills training, energy conservation, coping strategies training, patient/family education, and DME and/or AE instructions  RECOMMENDED OTHER SERVICES: SLP eval and treat d/t pt reporting memory deficits since CVA, drooling, and slurred  speech  CONSULTED AND AGREED WITH PLAN OF CARE: Patient  PLAN FOR NEXT SESSION: see above  Inocente Blazing, MS, OTR/L   06/17/2024, 7:51 PM

## 2024-06-17 NOTE — Therapy (Signed)
 OUTPATIENT PHYSICAL THERAPY TREATMENT   Patient Name: Mark Mcintosh MRN: [id number] DOB:1971-01-23, 53 y.o., male Today's Date: 06/17/2024  PCP: None REFERRING PROVIDER: [name], PA-C   PT End of Session - 06/17/24 1442     Visit Number 23    Number of Visits 31    Date for Recertification  07/08/24    Progress Note Due on Visit 30    PT Start Time 1445    PT Stop Time 1529    PT Time Calculation (min) 44 min    Equipment Utilized During Treatment Gait belt    Activity Tolerance Patient tolerated treatment well;No increased pain    Behavior During Therapy [facility] for tasks assessed/performed               Past Medical History:  Diagnosis Date   Acute CVA (cerebrovascular accident) (HCC) 01/09/2024   Arthritis    Hypertension    Stroke (HCC) 01/09/2024   Stated he had 2 strokes.   Past Surgical History:  Procedure Laterality Date   BREAST BIOPSY Right 03/15/2024   US  RT BREAST BX W LOC DEV 1ST LESION IMG BX SPEC US  GUIDE 03/15/2024 [facility]   TEE WITHOUT CARDIOVERSION N/A 01/14/2024   Procedure: ECHOCARDIOGRAM, TRANSESOPHAGEAL;  Surgeon: Alluri, Keller BROCKS, MD;  Location: [facility] ORS;  Service: Cardiovascular;  Laterality: N/A;   Patient Active Problem List   Diagnosis Date Noted   Gait disturbance, post-stroke 06/11/2024   Hemiparesis affecting left side as late effect of cerebrovascular accident (HCC) 04/01/2024   Mass of lower outer quadrant of right breast 02/27/2024   History of cerebrovascular accident (CVA) with residual deficit 02/27/2024   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) 02/27/2024   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) 02/27/2024   Gait abnormality 02/27/2024   Left-sided weakness 02/27/2024   Prediabetes 02/27/2024   Abnormal pulse oximetry 02/27/2024   Vitamin D  deficiency 02/27/2024   Bradycardia 02/27/2024   Depression, major, single episode, moderate (HCC) 02/27/2024    Coping style affecting medical condition 01/20/2024   Tobacco use disorder 01/09/2024   Obesity (BMI 30-39.9) 01/09/2024   Hypertension     ONSET DATE: 01/09/24  REFERRING DIAG: I63.9 (ICD-10-CM) - Acute CVA (cerebrovascular accident) (HCC)   THERAPY DIAG:  Muscle weakness (generalized)  Other lack of coordination  Acute CVA (cerebrovascular accident) (HCC)  Difficulty in walking, not elsewhere classified  Unsteadiness on feet  Other abnormalities of gait and mobility  Rationale for Evaluation and Treatment: Rehabilitation  SUBJECTIVE:  SUBJECTIVE STATEMENT:  Pt reports he was sore after last visit but excited to push himself  to see how well he can do.    PERTINENT HISTORY: [facility] ED 01/09/24 for progressively worsening L sided-weakness. multiple acute infarcts concerning for embolic showering. PMH includes arthritis, HTN. 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   PAIN:  Are you having pain? No  PRECAUTIONS: None  WEIGHT BEARING RESTRICTIONS: No  FALLS: Has patient fallen in last 6 months? Yes. Number of falls 1- pt reports that he tried to get up at home after returning from [facility] and he got up too fast and fell. Pt also reports stumbling on steps at home  LIVING ENVIRONMENT: Lives with: lives alone- pt's son was staying with him until this past Friday- son lives in [place], pt has friends that come by every other day Lives in: House/apartment Stairs: Yes: External: 4 steps; can reach both Has following equipment at home: Walker - 4 wheeled and shower chair  PLOF: Independent- pt works with mixing chemicals and dyes  PATIENT GOALS: Pt wants to get back to normal and be able to return to work  OBJECTIVE:   Note: Objective measures were completed at Evaluation unless otherwise noted.  DIAGNOSTIC FINDINGS: via chart  From 01/09/24 MRI Brain W/O Contrast: IMPRESSION: 1. Acute infarcts in the posterior limb of the right internal capsule, the overlying right frontal white matter, and pons. Given involvement of multiple vascular territories, consider an embolic etiology. 2. Multiple remote infarcts and chronic microvascular ischemic disease, detailed above.  From 01/10/24 CT Angio Head Neck W W/O CM: IMPRESSION: 1. No acute intracranial hemorrhage or mass effect. 2. No large vessel occlusion, hemodynamically significant stenosis, or aneurysm in the head or neck. 3. Remote lacunar infarcts in the genu of the left internal capsule, globus pallidus, and right corona radiata. 4. Acute infarct involving the posterior limb of the right internal capsule is less well appreciated at CT. 5. Moderately advanced periventricular white matter disease for age.   MMT:  MMT Right Eval Left Eval  Hip flexion 4- 3+  Hip extension      Hip abduction 5 5  Hip adduction 4+ 4+  Hip internal rotation      Hip external rotation      Knee flexion 5 4+  Knee extension 5 4+  Ankle dorsiflexion 5 4-  Ankle plantarflexion 5 4-  Ankle inversion      Ankle eversion      (Blank rows = not tested)  FUNCTIONAL TESTS (at eval):  5 times sit-to-stand: 17.9 seconds, pt utilizing arms on knees to assist with standing : 0.56 m/s with 4WW and CGA; 0.73 m/s with no AD and CGA BERG Balance Scale: 33/56 FGA: 18/30 on 02/25/24 : 795 ft with 4WW on 02/25/24  PATIENT SURVEYS (eval):  Stroke Impact Scale: 45/80  TREATMENT DATE: 06/15/24   TA:   Sit to stand + farmers carry (15# KB in each hand and 7#AW on ea ankle)- walk 10 feet fwd and 10 feet bwd x 12 reps- BORG = 12    Bwd/fwd  walking (gripping rope handle - Matrix cable) at 17.5# - x 5 with RUE and then 5 with LUE (total of 10 each) - Difficulty with grip on LUE but no LOB  (BORG- 16)   Front step up onto1st step  with 7# AW while holding 7# DB at shoulder height with arms flexed -2 sets x 8  reps  (BORG- 14)   Wall push ups - 2 x 10 push up   Quadraped - alt LE hip ext - 7# AW x 10 reps each  Quadraped - bird dog - alt OppUE/LE x 10 reps each - Paitent reports as very challenging   Lunge fwd walk- x 10 feet and back x 2 with brief rest break 7# AW's   Tall kneeling to 1/2 kneeling  to stand at edge of mat   NMR-  Walking in hallway x 60 feet - horizontal head turns - calling out items on sticky notes on wall x 2 trials.     PT provided CGA to supervision assist throughout session unless otherwise noted throughout session.     PATIENT EDUCATION: Education details:  No longer requires [place].   Pt has made remarkable toward ADL based goals, however a massive gap remains between activities he has done here in clinic and what he will need to do to prepare for return to work duties.  Person educated: Patient Education method: Explanation, Demonstration, and Handouts Education comprehension: verbalized understanding and returned demonstration  HOME EXERCISE PROGRAM: Access Code: [email or web address]  URL: [email or web address]  Date: 02/17/2024  Prepared by: Darryle Patten  Exercises: - Seated March  - 1 x daily - 3 x weekly - 3 sets - 10 reps  - Standing March with Counter Support  - 1 x daily - 3 x weekly - 3 sets - 10 reps  - Mini Squat with Counter Support  - 1 x daily - 3 x weekly - 3 sets - 10 reps  - Heel Toe Raises with Counter Support  - 1 x daily - 3 x weekly - 3 sets - 10 reps   GOALS: Goals reviewed with patient? Yes  SHORT TERM GOALS: Target date: 06/04/24  Patient will be independent in home exercise program to improve strength/mobility for better functional independence with  ADL. Baseline: HEP reviewed during eval Goal status: ACHIEVED  LONG TERM GOALS: Target date: 07/08/24  Patient will increase their Stroke Impact Scale score by >10 points for improved function and independence Baseline: 45/80 8/13: 70/80 Goal status: MET   2.  Patient (< 72 years old) will complete five times sit to stand test in < 10 seconds indicating an increased LE strength and improved balance. Baseline: 17.9 seconds with UE support; 03/29/24: 11.7 hands free; 06/08/2024=11.2 sec without UE support  Goal status: progress made, goal not met   3.  Patient will increase 10 meter walk test to >0.8 m/s as to improve gait speed for better community ambulation and to reduce fall risk. Baseline: 0.56 m/s with 5TT & 0.73 m/s with no AD ; 8/12: 1.34m/s  Goal status: ACHIEVED  4.  Patient will increase Berg Balance score by > 6 points to demonstrate decreased fall risk during functional activities. Baseline: 33/56; 03/30/24: 54/56 Goal status: ACHIEVED,  5.  Patient will increase Functional Gait Assessment score to >20/30 as to reduce fall risk and improve dynamic gait safety with community ambulation. Baseline:18/30 on 02/25/24; 03/30/24: 27/30 Goal status: ACHIEVED  6.  Patient will increase six minute walk test distance to > 1559ft ft for progression to limited community Ambulator and improved independence with access to community and occupation  Baseline: 738ft with 4WW on 02/25/24; 03/11/24: 936ft c SPC(MET >51ft improvement); 06/08/2024= 1200 feet Goal status: MET: adjusted. NEW.   7. Pt will increase SLS to > 20 sec bil to improve safety with ADLs and IADLs including showering, shopping, and work related tasks.   Baseline: 8/13: 4 sec on the R, 3 sec on the L;   06/08/2024= 17 on R; 20 sec on L  Goal status: Progressing  ASSESSMENT:  CLINICAL IMPRESSION:  Patient presents with good overall motivation today. He continues to work on overall functional strength with some emphasis on  progressing L grip strength along with overall LE Strengthening. He was able to use BORG scale well today and responded well to all activities performed with only fatigue as limiting factor. Pt will benefit from continued therapy in order to address his impairments, and improve his ability to return to work-related activities, and to improve his overall quality of life.  OBJECTIVE IMPAIRMENTS: Abnormal gait, decreased activity tolerance, decreased balance, decreased coordination, decreased endurance, decreased mobility, difficulty walking, decreased ROM, decreased strength, hypomobility, impaired perceived functional ability, impaired flexibility, impaired UE functional use, improper body mechanics, and postural dysfunction.   ACTIVITY LIMITATIONS: carrying, lifting, bending, sitting, standing, squatting, stairs, transfers, bathing, reach over head, hygiene/grooming, and locomotion level  PARTICIPATION LIMITATIONS: meal prep, cleaning, laundry, driving, shopping, community activity, occupation, and yard work  PERSONAL FACTORS: Age and Fitness are also affecting patient's functional outcome.   REHAB POTENTIAL: Good  CLINICAL DECISION MAKING: Evolving/moderate complexity  EVALUATION COMPLEXITY: Moderate  PLAN:  PT FREQUENCY: 2x/week  PT DURATION: 12 weeks  PLANNED INTERVENTIONS: [id number]- PT Re-evaluation, 97750- Physical Performance Testing, 97110-Therapeutic exercises, 97530- Therapeutic activity, [id number]- Neuromuscular re-education, 97535- Self Care, [id number]- Manual therapy, [id number]- Gait training, [id number]- Orthotic Initial, [id number]- Orthotic/Prosthetic subsequent, [phone]- Canalith repositioning, [id number]- Splinting, [id number]- Electrical stimulation (manual), [id number]- Ultrasound, [id number]- Traction (mechanical), [phone] (1-2 muscles), [id number] (3+ muscles)- Dry Needling, Patient/Family education, Balance training, Stair training, Taping, Joint mobilization, Spinal mobilization, Vestibular training, Visual/preceptual  remediation/compensation, Cognitive remediation, DME instructions, Cryotherapy, and Moist heat  PLAN FOR NEXT SESSION:   Work ready activities- lifting, progressive LE strengthening- gym based activities as well.   Reyes LOISE London PT  Physical Therapist- [place]  [facility]   9:49 PM 06/17/24

## 2024-06-17 NOTE — Therapy (Signed)
 OUTPATIENT OCCUPATIONAL THERAPY NEURO TREATMENT NOTE  Patient Name: Mark Mcintosh MRN: [id number] DOB:[birth year], 53 y.o., male Today's Date: 06/17/2024  PCP: No PCP REFERRING PROVIDER: [name], PA-C  END OF SESSION:   OT End of Session - 06/17/24 1455     Visit Number 26    Number of Visits 41    Date for Recertification  08/05/24    Progress Note Due on Visit 30    OT Start Time 1400    OT Stop Time 1445    OT Time Calculation (min) 45 min    Activity Tolerance Patient tolerated treatment well    Behavior During Therapy [facility] for tasks assessed/performed         Past Medical History:  Diagnosis Date   Acute CVA (cerebrovascular accident) (HCC) 01/09/2024   Arthritis    Hypertension    Stroke (HCC) 01/09/2024   Stated he had 2 strokes.   Past Surgical History:  Procedure Laterality Date   BREAST BIOPSY Right 03/15/2024   US  RT BREAST BX W LOC DEV 1ST LESION IMG BX SPEC US  GUIDE 03/15/2024 [facility]   TEE WITHOUT CARDIOVERSION N/A 01/14/2024   Procedure: ECHOCARDIOGRAM, TRANSESOPHAGEAL;  Surgeon: Alluri, Keller BROCKS, MD;  Location: [facility]S;  Service: Cardiovascular;  Laterality: N/A;   Patient Active Problem List   Diagnosis Date Noted   Gait disturbance, post-stroke 06/11/2024   Hemiparesis affecting left side as late effect of cerebrovascular accident (HCC) 04/01/2024   Mass of lower outer quadrant of right breast 02/27/2024   History of cerebrovascular accident (CVA) with residual deficit 02/27/2024   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) 02/27/2024   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) 02/27/2024   Gait abnormality 02/27/2024   Left-sided weakness 02/27/2024   Prediabetes 02/27/2024   Abnormal pulse oximetry 02/27/2024   Vitamin D  deficiency 02/27/2024   Bradycardia 02/27/2024   Depression, major, single episode, moderate (HCC) 02/27/2024   Coping style affecting medical condition  01/20/2024   Tobacco use disorder 01/09/2024   Obesity (BMI 30-39.9) 01/09/2024   Hypertension    ONSET DATE: 01/09/24  REFERRING DIAG: I63.9 (ICD-10-CM) - Acute CVA (cerebrovascular accident) (HCC)   THERAPY DIAG:  Muscle weakness (generalized)  Other lack of coordination  Acute CVA (cerebrovascular accident) (HCC)  Rationale for Evaluation and Treatment: Rehabilitation  SUBJECTIVE:  SUBJECTIVE STATEMENT: Pt reports MD has recommended no return to work for another 3 months.  Pt will follow back up with Dr. Carilyn in early January to reassess work readiness. Pt accompanied by: self  PERTINENT HISTORY: Per chart:  Mark Mcintosh is a 53 y.o. right-handed male with history significant for bilateral conjunctival inflammation hypertension as well as tobacco use and class II morbid obesity with BMI 36.26.  Per chart review patient lives alone independent prior to admission.  [facility]ed to [facility] with acute onset of left-sided weakness.  MRI showed acute infarct in the posterior limb of the right internal capsule, the overlying right frontal white matter and pons.  Multiple remote infarcts and chronic microvascular ischemic disease.  CTA showed no large vessel occlusion.  Admission chemistries unremarkable except potassium 3.1.  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  TEE with small PFO per cardiology services with no plan for closure.  Neurology follow-up placed on aspirin  and Plavix  for CVA prophylaxis x 3 weeks then aspirin  alone.  Lovenox  for DVT prophylaxis but bilateral Doppler studies negative.  Therapy evaluations completed due to patient decreased functional mobility left-sided  weakness was admitted for a comprehensive rehab program.   PRECAUTIONS: Fall  WEIGHT BEARING RESTRICTIONS: No  PAIN: 06/15/24: No pain Are you having pain? 0/10  FALLS: Has patient fallen in last 6 months? Yes. 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  Get back my independence.    OBJECTIVE:  Note: Objective measures were completed at Evaluation unless otherwise noted.  HAND DOMINANCE: Right  ADLs: Overall ADLs: Son was staying with pt for about a week after d/c from inpatient rehab, but has since returned home.  Friend comes by every other day to check in.   Transfers/ambulation related to ADLs: modified indep with rollator Eating: increased difficulty with cutting food  Grooming: increased difficulty with clipping nails, otherwise manages fine with dominant/unaffected hand UB Dressing: increased time with clothing fasteners LB Dressing: increased time with clothing fasteners  Toileting: increased time to engage L hand into clothing management Bathing: pt sits on shower chair to wash LEs, some SOB with standing in shower Tub Shower transfers: Modified indep Equipment: Shower seat without back  IADLs: Shopping: motorized cart, can push cart for shorter shopping trips.  Light housekeeping: extra time and cautions to avoid falls, sometimes feels SOB with laundry  Meal Prep: friend helps with stove top cooking, pt can do light hot and cold meal prep  Community mobility: rollator, friend drove pt  Medication management: indep  Landscape architect: indep  Handwriting: NT; L non-dominant hand affected   MOBILITY STATUS: Hx of falls, rollator or funiture/wall walking in the home, rollator for community  POSTURE COMMENTS:  L sided hemiparesis   ACTIVITY TOLERANCE: Activity tolerance: Pt reports some dyspnea with prolonged standing/mobility  UPPER EXTREMITY ROM:  BUEs WFL  UPPER EXTREMITY  MMT:     MMT Right eval Left eval Left 03/31/24 Left 05/13/24  Shoulder flexion 5 4- 4- 4+  Shoulder abduction 5 4- 4- 4+  Shoulder adduction      Shoulder extension      Shoulder internal rotation   4- 4+  Shoulder external rotation   3+ 4  Middle trapezius      Lower trapezius      Elbow flexion 5 4- 4+ 5  Elbow extension 5 4- 4+ 5  Wrist flexion 5 4- 4+ 5  Wrist extension 5 4- 4 4+  Wrist ulnar deviation      Wrist radial deviation      Wrist pronation 5 4- 4+ 4+  Wrist supination 5 4- 4+ 4+  (Blank rows = not tested)  HAND FUNCTION: Eval: Grip strength: Right: 111 lbs; Left: 51 lbs, Lateral pinch: Right: 23 lbs, Left: 13 lbs, and 3 point pinch: Right: 26 lbs, Left: 13 lbs 03/31/24: Grip strength: Right: 101 lbs, Left: 31 lbs, lateral pinch: Right: 23 lbs, Left: 13 lbs, 3 point pinch: Right: 25 lbs, Left: 14 lbs  04/06/24: Grip strength: Left: 32 lbs 04/13/24: Grip strength: Left: 45 lbs  05/04/24: Grip strength: Left: 24 lbs before stretching hand; 40 lbs after stretching; Lateral pinch: Left: 15 lbs, 3 point pinch: Left: 14 lbs  05/13/24: Grip strength: Left: 44 lbs; Lateral pinch: Left: 16 lbs, 3 point pinch: Left: 13 lbs  05/25/24: Grip strength: Left: 44 lbs, Lateral pinch: Left: 16 lbs, 3 point pinch: Left: 11 lbs (measured on [place]; previously measured on standard) 06/08/24: Grip strength: Left: 46 lbs, 44 lbs, 45 lbs (3 trials) 06/10/24: Grip strength: eft: 47#, 53#, 51# (3 trials)  COORDINATION: Finger Nose Finger test: increased time and decreased accuracy on the L  9 Hole Peg test: Right: 25 sec; Left: 1 min 27 sec 03/31/24: L 52 sec, 1 min 4 sec, 39 sec  05/13/24: 43 sec  05/25/24: 36 sec (after multiple trials)   05/13/24: Finger knee to nose 5x: R: 5 sec, L: 6 sec, mild dysmetria 05/25/24: L 6 sec, mild dysmetria   SENSATION: Pt reports increased tingling/numbness when hand balls up a little, but when he straightens it out it goes back to normal.   EDEMA: no  visible edema   MUSCLE TONE: LUE: Mild   COGNITION: Overall cognitive status: Within functional limits for tasks assessed pt reports difficulties with memory   VISION:             Subjective report: Wears glasses all the time.  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                                                                                                             TREATMENT DATE: 06/15/24 Therapeutic Activities:  -Facilitated LUE forward and lateral reaching toward a target and L lateral and 3 point pinch strengthening, working to [place] therapy resistant clothespins (all colors) on/off a vertical and horizontal dowel.  Self Care: Work Hardening -Participation in work hardening activities, including lifting 9# dumbbells (1 in each hand) and placing dumbbells between 3 shelving heights waist level, chest level, above the shoulder level x3 each.  Pt required CGA to maintain control of 9# dumbbell in L hand. -Simulated lifting/pouring motions at chest level with large 15# therapy ball  -Targeted sustained L hand grasp of 9# dumbbell with forearm in varying positions (sup/pron/neutral) while ambulating laps around clinic.  -Targeted sustained L hand grasp while pushing transport chair x3 laps around clinic with L arm only with no passenger,  x3 laps around clinic with adult passenger in chair, L arm only  PATIENT EDUCATION: Education details:  Work scientific laboratory technician activities  Person educated: Patient Education method: Verbal cues Education  comprehension: verbalized understanding  HOME EXERCISE PROGRAM: Green theraputty 2-3x per day for 5-10 min periods for L hand grip strengthening, [facility] handout Tendon glides, passive stretching to the L wrist/digits, L hand AROM  GOALS: Goals reviewed with patient? Yes  SHORT TERM GOALS:  Target date: 03/30/24  Pt will be indep to perform HEP for increasing strength and coordination throughout the LUE. Baseline: Eval: Not yet initiated; 03/31/24: Indep with HEP and verbalizes understanding of progressions made today for frequency Goal status: achieved  LONG TERM GOALS: Target date: 08/05/24  Pt will increase LUE MMT by 1/2 grade or more to increase engagement of LUE into ADL/IADLs. Baseline: Eval: LUE grossly 4-/5 throughout (R 5/5); 03/31/24: L shoulder grossly 4-, elbow flex 4+, ext 4, forearm pron/sup 4+; 05/13/24: L shoulder 4 to 4+/5, elbow 5/5, wrist 4+ to 5/5 Goal status: achieved  2.  Pt will increase L grip strength by 20 lbs or more to enable pt to grasp and carry heavy ADL supplies in L hand. Baseline: Eval: L 51 lbs (R 111 lbs); 03/31/24: L grip 31 lbs (R 101 lbs); 05/13/24: L grip 44 lbs (edema in L hand has caused fluctuations in hand strength since eval); 05/25/24: L grip 44 lbs Goal status: in progress  3.  Pt will increase L hand [facility] skills as demonstrated by completion of 9 hole peg test in 30 sec or less to improve manipulation of small ADL supplies. Baseline: Eval: L 1 min 27 sec (R 25 sec); 03/31/24: 3 trials completed today: L 52 sec, 1 min 4 sec, 39 sec; 05/13/24: L 43 sec (R 25 sec); 05/25/24: L 36 sec (after several trials) Goal status: in progress  4.  Pt will increase LUE GMC to enable confidence with moving hot pots/pans on/off stove top and in/out of the oven using BUEs. Baseline: Eval: Pt reports that his friend currently assists with heavier meal prep; 03/31/24: Pt is now cooking independently, but with increased time/caution; 05/13/24: LUE GMC has greatly improved,  though pt still demos mild dysmetria and must use caution when lifting/carrying items noted above; 05/25/24: mild dysmetria/continues to use caution Goal status: in progress  5. Pt will increase tolerance for engaging the LUE into sustained pushing/grasping manuevers to enable push mowing yard in <45 min.  Baseline: Recert 05/13/24: Pt reports he prefers family to mow his lawn as it takes him increased time, estimating 45-60 min (brother mows in  30 min; 05/25/24: Limited mowing since L foot metatarsal fx  Goal status: in progress   6.  Pt will efficiently pick up flat items from table top using L hand.  Baseline: Recert 05/13/24: Pt requires extra time/repeat trials with attempts at picking up coins, flat stones, paper from table top when using  L hand; 05/25/24: extra time for above (see 05/13/24)  Goal status: in progress  ASSESSMENT: CLINICAL IMPRESSION: Pt continues to demonstrate mild-moderate ataxia throughout the LUE, proximally and distally.  Noted difficulty controlling pouring motion at chest level with a 15# ball; work requires pt to lift and pour ~40 lbs, per pt.  Pt reports fatigue in L hand with sustained gripping and transporting.  Practiced these skills today with a 9# dumbbell in the L hand with forearm held in a variety of positions, as work requires high reps of lifting/carrying heavy items repeatedly throughout the day (40# buckets).  Pt continues to benefit from skilled OT to work towards above noted goals for improving indep with daily tasks, return to work, to reduce burden of care on caregivers, and improve QOL.    PERFORMANCE DEFICITS: in functional skills including ADLs, IADLs, coordination, dexterity, sensation, tone, strength, Fine motor control, Gross motor control, mobility, balance, body mechanics, endurance, decreased knowledge of precautions, decreased knowledge of use of DME, vision,  and UE functional use, cognitive skills including memory, and psychosocial skills  including coping strategies, environmental adaptation, and routines and behaviors.   IMPAIRMENTS: are limiting patient from ADLs, IADLs, work, and leisure.   CO-MORBIDITIES: has co-morbidities such as tobacco use disorder, HTN, arthritis that affects occupational performance. Patient will benefit from skilled OT to address above impairments and improve overall function.  MODIFICATION OR ASSISTANCE TO COMPLETE EVALUATION: No modification of tasks or assist necessary to complete an evaluation.  OT OCCUPATIONAL PROFILE AND HISTORY: Detailed assessment: Review of records and additional review of physical, cognitive, psychosocial history related to current functional performance.  CLINICAL DECISION MAKING: Moderate - several treatment options, min-mod task modification necessary  REHAB POTENTIAL: Good  EVALUATION COMPLEXITY: Moderate  PLAN:  OT FREQUENCY: 2x/week  OT DURATION: 12 weeks  PLANNED INTERVENTIONS: 97168 OT Re-evaluation, 97535 self care/ADL training, 02889 therapeutic exercise, 97530 therapeutic activity, 97112 neuromuscular re-education, 97140 manual therapy, 97116 gait training, 02989 moist heat, 97010 cryotherapy, 97750 Physical Performance Testing, passive range of motion, balance training, functional mobility training, visual/perceptual remediation/compensation, psychosocial skills training, energy conservation, coping strategies training, patient/family education, and DME and/or AE instructions  RECOMMENDED OTHER SERVICES: SLP eval and treat d/t pt reporting memory deficits since CVA, drooling, and slurred speech  CONSULTED AND AGREED WITH PLAN OF CARE: Patient  PLAN FOR NEXT SESSION: see above  Inocente Blazing, MS, OTR/L   06/17/2024, 2:57 PM

## 2024-06-18 ENCOUNTER — Encounter: Payer: Self-pay | Admitting: Emergency Medicine

## 2024-06-22 ENCOUNTER — Ambulatory Visit

## 2024-06-22 ENCOUNTER — Encounter: Admitting: Speech Pathology

## 2024-06-24 ENCOUNTER — Encounter: Admitting: Speech Pathology

## 2024-06-24 ENCOUNTER — Ambulatory Visit: Admitting: Physical Therapy

## 2024-06-24 ENCOUNTER — Ambulatory Visit: Attending: Physician Assistant

## 2024-06-24 DIAGNOSIS — R278 Other lack of coordination: Secondary | ICD-10-CM

## 2024-06-24 DIAGNOSIS — R262 Difficulty in walking, not elsewhere classified: Secondary | ICD-10-CM | POA: Diagnosis present

## 2024-06-24 DIAGNOSIS — R2681 Unsteadiness on feet: Secondary | ICD-10-CM | POA: Insufficient documentation

## 2024-06-24 DIAGNOSIS — I639 Cerebral infarction, unspecified: Secondary | ICD-10-CM | POA: Diagnosis present

## 2024-06-24 DIAGNOSIS — R2689 Other abnormalities of gait and mobility: Secondary | ICD-10-CM | POA: Diagnosis present

## 2024-06-24 DIAGNOSIS — M6281 Muscle weakness (generalized): Secondary | ICD-10-CM | POA: Diagnosis present

## 2024-06-24 NOTE — Therapy (Signed)
 OUTPATIENT OCCUPATIONAL THERAPY NEURO TREATMENT NOTE  Patient Name: Mark Mcintosh MRN: [id number] DOB:[birth year], 53 y.o., male Today's Date: 06/26/2024  PCP: No PCP REFERRING PROVIDER: [name], PA-C  END OF SESSION:   OT End of Session - 06/26/24 1155     Visit Number 28    Number of Visits 41    Date for Recertification  08/05/24    Progress Note Due on Visit 30    OT Start Time 1400    OT Stop Time 1445    OT Time Calculation (min) 45 min    Activity Tolerance Patient tolerated treatment well    Behavior During Therapy [facility] for tasks assessed/performed         Past Medical History:  Diagnosis Date   Acute CVA (cerebrovascular accident) (HCC) 01/09/2024   Arthritis    Hypertension    Stroke (HCC) 01/09/2024   Stated he had 2 strokes.   Past Surgical History:  Procedure Laterality Date   BREAST BIOPSY Right 03/15/2024   US  RT BREAST BX W LOC DEV 1ST LESION IMG BX SPEC US  GUIDE 03/15/2024 [facility]   TEE WITHOUT CARDIOVERSION N/A 01/14/2024   Procedure: ECHOCARDIOGRAM, TRANSESOPHAGEAL;  Surgeon: Alluri, Keller BROCKS, MD;  Location: [facility] ORS;  Service: Cardiovascular;  Laterality: N/A;   Patient Active Problem List   Diagnosis Date Noted   Gait disturbance, post-stroke 06/11/2024   Hemiparesis affecting left side as late effect of cerebrovascular accident (HCC) 04/01/2024   Mass of lower outer quadrant of right breast 02/27/2024   History of cerebrovascular accident (CVA) with residual deficit 02/27/2024   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) 02/27/2024   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) 02/27/2024   Gait abnormality 02/27/2024   Left-sided weakness 02/27/2024   Prediabetes 02/27/2024   Abnormal pulse oximetry 02/27/2024   Vitamin D  deficiency 02/27/2024   Bradycardia 02/27/2024   Depression, major, single episode, moderate (HCC) 02/27/2024   Coping style affecting medical condition  01/20/2024   Tobacco use disorder 01/09/2024   Obesity (BMI 30-39.9) 01/09/2024   Hypertension    ONSET DATE: 01/09/24  REFERRING DIAG: I63.9 (ICD-10-CM) - Acute CVA (cerebrovascular accident) (HCC)   THERAPY DIAG:  Muscle weakness (generalized)  Other lack of coordination  Acute CVA (cerebrovascular accident) (HCC)  Rationale for Evaluation and Treatment: Rehabilitation  SUBJECTIVE:  SUBJECTIVE STATEMENT: Pt thinks he slept wrong on Saturday night and feels like he has a pulled muscle in his back.  Pt reports it's slowly feeling better. Pt accompanied by: self  PERTINENT HISTORY: Per chart:  Bowdy Bair is a 53 y.o. right-handed male with history significant for bilateral conjunctival inflammation hypertension as well as tobacco use and class II morbid obesity with BMI 36.26.  Per chart review patient lives alone independent prior to admission.  Presented to [facility]/09/2024 with acute onset of left-sided weakness.  MRI showed acute infarct in the posterior limb of the right internal capsule, the overlying right frontal white matter and pons.  Multiple remote infarcts and chronic microvascular ischemic disease.  CTA showed no large vessel occlusion.  Admission chemistries unremarkable except potassium 3.1.  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  TEE with small PFO per cardiology services with no plan for closure.  Neurology follow-up placed on aspirin  and Plavix  for CVA prophylaxis x 3 weeks then aspirin  alone.  Lovenox  for DVT prophylaxis but bilateral Doppler studies negative.  Therapy evaluations completed due to patient decreased functional mobility left-sided weakness was admitted  for a comprehensive rehab program.   PRECAUTIONS: Fall  WEIGHT BEARING RESTRICTIONS: No  PAIN: 06/24/24: 2/10 back pain from pulled muscle  Are you having pain? 0/10  FALLS: Has patient fallen in last 6 months? Yes. 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  Get back my independence.    OBJECTIVE:  Note: Objective measures were completed at Evaluation unless otherwise noted.  HAND DOMINANCE: Right  ADLs: Overall ADLs: Son was staying with pt for about a week after d/c from inpatient rehab, but has since returned home.  Friend comes by every other day to check in.   Transfers/ambulation related to ADLs: modified indep with rollator Eating: increased difficulty with cutting food  Grooming: increased difficulty with clipping nails, otherwise manages fine with dominant/unaffected hand UB Dressing: increased time with clothing fasteners LB Dressing: increased time with clothing fasteners  Toileting: increased time to engage L hand into clothing management Bathing: pt sits on shower chair to wash LEs, some SOB with standing in shower Tub Shower transfers: Modified indep Equipment: Shower seat without back  IADLs: Shopping: motorized cart, can push cart for shorter shopping trips.  Light housekeeping: extra time and cautions to avoid falls, sometimes feels SOB with laundry  Meal Prep: friend helps with stove top cooking, pt can do light hot and cold meal prep  Community mobility: rollator, friend drove pt  Medication management: indep  Landscape architect: indep  Handwriting: NT; L non-dominant hand affected   MOBILITY STATUS: Hx of falls, rollator or funiture/wall walking in the home, rollator for community  POSTURE COMMENTS:  L sided hemiparesis   ACTIVITY TOLERANCE: Activity tolerance: Pt reports some dyspnea with prolonged standing/mobility  UPPER EXTREMITY ROM:  BUEs WFL  UPPER EXTREMITY  MMT:     MMT Right eval Left eval Left 03/31/24 Left 05/13/24  Shoulder flexion 5 4- 4- 4+  Shoulder abduction 5 4- 4- 4+  Shoulder adduction      Shoulder extension      Shoulder internal rotation   4- 4+  Shoulder external rotation   3+ 4  Middle trapezius      Lower trapezius      Elbow flexion 5 4- 4+ 5  Elbow extension 5 4- 4+ 5  Wrist flexion 5 4- 4+ 5  Wrist extension 5 4- 4 4+  Wrist ulnar deviation      Wrist radial deviation      Wrist pronation 5 4- 4+ 4+  Wrist supination 5 4- 4+ 4+  (Blank rows = not tested)  HAND FUNCTION: Eval: Grip strength: Right: 111 lbs; Left: 51 lbs, Lateral pinch: Right: 23 lbs, Left: 13 lbs, and 3 point pinch: Right: 26 lbs, Left: 13 lbs 03/31/24: Grip strength: Right: 101 lbs, Left: 31 lbs, lateral pinch: Right: 23 lbs, Left: 13 lbs, 3 point pinch: Right: 25 lbs, Left: 14 lbs  04/06/24: Grip strength: Left: 32 lbs 04/13/24: Grip strength: Left: 45 lbs  05/04/24: Grip strength: Left: 24 lbs before stretching hand; 40 lbs after stretching; Lateral pinch: Left: 15 lbs, 3 point pinch: Left:  14 lbs  05/13/24: Grip strength: Left: 44 lbs; Lateral pinch: Left: 16 lbs, 3 point pinch: Left: 13 lbs  05/25/24: Grip strength: Left: 44 lbs, Lateral pinch: Left: 16 lbs, 3 point pinch: Left: 11 lbs (measured on [place]; previously measured on standard) 06/08/24: Grip strength: Left: 46 lbs, 44 lbs, 45 lbs (3 trials) 06/10/24: Grip strength: eft: 47#, 53#, 51# (3 trials)  COORDINATION: Finger Nose Finger test: increased time and decreased accuracy on the L  9 Hole Peg test: Right: 25 sec; Left: 1 min 27 sec 03/31/24: L 52 sec, 1 min 4 sec, 39 sec  05/13/24: 43 sec  05/25/24: 36 sec (after multiple trials)   05/13/24: Finger knee to nose 5x: R: 5 sec, L: 6 sec, mild dysmetria 05/25/24: L 6 sec, mild dysmetria   SENSATION: Pt reports increased tingling/numbness when hand balls up a little, but when he straightens it out it goes back to normal.   EDEMA: no  visible edema   MUSCLE TONE: LUE: Mild   COGNITION: Overall cognitive status: Within functional limits for tasks assessed pt reports difficulties with memory   VISION:             Subjective report: Wears glasses all the time.  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                                                                                                             TREATMENT DATE: 06/24/24 Therapeutic Exercise: -L hand lumbrical strengthening: active, active assisted, and isometric  -L tendon glides x5 reps -L grip strengthening: Hand gripper set at 28.9# to remove jumbo pegs from pegboard  Therapeutic Activity: -Facilitated L GMC/FMC skills working to place jumbo pegs into pegboard for set up of hand gripper exercise noted above.  1# wrist weight donned to L wrist to increase challenge, with pegboard placed on an incline to further challenge forward reaching.  Neuro re-ed: -Facilitated LUE FMC/dexterity skills working on environmental health practitioner with [place] stones: scooping, storing, translatory movements working with 1-4 stones in hand at a time.  PATIENT EDUCATION: Education details:  LUE strengthening/coordination training   Person educated: Patient Education method: Verbal cues Education comprehension: verbalized understanding, returned demonstration, and verbal cues required  HOME EXERCISE PROGRAM: Green theraputty 2-3x per day for 5-10 min periods for L hand grip strengthening, [facility] handout Tendon glides, passive stretching to the L  wrist/digits, L hand AROM  GOALS: Goals reviewed with patient? Yes  SHORT TERM GOALS: Target date: 03/30/24  Pt will be indep to perform HEP for increasing strength and coordination throughout the LUE. Baseline: Eval: Not yet initiated; 03/31/24: Indep with HEP and verbalizes understanding of progressions made  today for frequency Goal status: achieved  LONG TERM GOALS: Target date: 08/05/24  Pt will increase LUE MMT by 1/2 grade or more to increase engagement of LUE into ADL/IADLs. Baseline: Eval: LUE grossly 4-/5 throughout (R 5/5); 03/31/24: L shoulder grossly 4-, elbow flex 4+, ext 4, forearm pron/sup 4+; 05/13/24: L shoulder 4 to 4+/5, elbow 5/5, wrist 4+ to 5/5 Goal status: achieved  2.  Pt will increase L grip strength by 20 lbs or more to enable pt to grasp and carry heavy ADL supplies in L hand. Baseline: Eval: L 51 lbs (R 111 lbs); 03/31/24: L grip 31 lbs (R 101 lbs); 05/13/24: L grip 44 lbs (edema in L hand has caused fluctuations in hand strength since eval); 05/25/24: L grip 44 lbs Goal status: in progress  3.  Pt will increase L hand [facility] skills as demonstrated by completion of 9 hole peg test in 30 sec or less to improve manipulation of small ADL supplies. Baseline: Eval: L 1 min 27 sec (R 25 sec); 03/31/24: 3 trials completed today: L 52 sec, 1 min 4 sec, 39 sec; 05/13/24: L 43 sec (R 25 sec); 05/25/24: L 36 sec (after several trials) Goal status: in progress  4.  Pt will increase LUE GMC to enable confidence with moving hot pots/pans on/off stove top and in/out of the oven using BUEs. Baseline: Eval: Pt reports that his friend currently assists with heavier meal prep; 03/31/24: Pt is now cooking independently, but with increased time/caution; 05/13/24: LUE GMC has greatly improved, though pt still demos mild dysmetria and must use caution when lifting/carrying items noted above; 05/25/24: mild dysmetria/continues to use caution Goal status: in progress  5. Pt will increase tolerance  for engaging the LUE into sustained pushing/grasping manuevers to enable push mowing yard in <45 min.  Baseline: Recert 05/13/24: Pt reports he prefers family to mow his lawn as it takes him increased time, estimating 45-60 min (brother mows in  30 min; 05/25/24: Limited mowing since L foot metatarsal fx  Goal status: in progress   6.  Pt will efficiently pick up flat items from table top using L hand.  Baseline: Recert 05/13/24: Pt requires extra time/repeat trials with attempts at picking up coins, flat stones, paper from table top when using  L hand; 05/25/24: extra time for above (see 05/13/24)  Goal status: in progress  ASSESSMENT: CLINICAL IMPRESSION: Pt reporting improvements in pain over the last few days after sustaining a pulled muscle in his back last weekend.  Pt with good tolerance to LUE exercises and activities this date.  Added lumbrical strengthening exercises with issue of visual handouts to further target intrinsic strengthening which continues to limit FMC/manipulation skills of small objects in L hand.  Pt continues to benefit from skilled OT to work towards above noted goals for improving indep with daily tasks, return to work, to reduce burden of care on caregivers, and improve QOL.    PERFORMANCE DEFICITS: in functional skills including ADLs, IADLs, coordination, dexterity, sensation, tone, strength, Fine motor control, Gross motor control, mobility, balance, body mechanics, endurance, decreased knowledge of precautions, decreased knowledge of use of DME, vision, and UE functional use, cognitive skills including memory, and psychosocial skills including coping strategies, environmental adaptation, and routines and behaviors.   IMPAIRMENTS: are limiting patient from ADLs, IADLs, work, and leisure.   CO-MORBIDITIES: has co-morbidities such as tobacco use disorder, HTN, arthritis that affects occupational performance. Patient will benefit from skilled OT to address above impairments  and improve overall function.  MODIFICATION OR ASSISTANCE TO COMPLETE EVALUATION: No modification of tasks or assist necessary to complete an evaluation.  OT OCCUPATIONAL PROFILE AND HISTORY: Detailed assessment: Review of records and additional review of physical, cognitive, psychosocial history related to current functional performance.  CLINICAL DECISION MAKING: Moderate - several treatment options, min-mod task modification necessary  REHAB POTENTIAL: Good  EVALUATION COMPLEXITY: Moderate  PLAN:  OT FREQUENCY: 2x/week  OT DURATION: 12 weeks  PLANNED INTERVENTIONS: 97168 OT Re-evaluation, 97535 self care/ADL training, 02889 therapeutic exercise, 97530 therapeutic activity, 97112 neuromuscular re-education, 97140 manual therapy, 97116 gait training, 02989 moist heat, 97010 cryotherapy, 97750 Physical Performance Testing, passive range of motion, balance training, functional mobility training, visual/perceptual remediation/compensation, psychosocial skills training, energy conservation, coping strategies training, patient/family education, and DME and/or AE instructions  RECOMMENDED OTHER SERVICES: SLP eval and treat d/t pt reporting memory deficits since CVA, drooling, and slurred speech  CONSULTED AND AGREED WITH PLAN OF CARE: Patient  PLAN FOR NEXT SESSION: see above  Inocente Blazing, MS, OTR/L   06/26/2024, 11:58 AM

## 2024-06-24 NOTE — Therapy (Unsigned)
 OUTPATIENT PHYSICAL THERAPY TREATMENT   Patient Name: Emma Schupp MRN: [id number] DOB:15-Sep-1970, 53 y.o., male Today's Date: 06/24/2024  PCP: None REFERRING PROVIDER: [name], PA-C   PT End of Session - 06/24/24 1512     Visit Number 24    Number of Visits 31    Date for Recertification  07/08/24    Progress Note Due on Visit 30    PT Start Time 1448    PT Stop Time 1528    PT Time Calculation (min) 40 min    Equipment Utilized During Treatment Gait belt    Activity Tolerance Patient tolerated treatment well;No increased pain    Behavior During Therapy [facility] for tasks assessed/performed                Past Medical History:  Diagnosis Date   Acute CVA (cerebrovascular accident) (HCC) 01/09/2024   Arthritis    Hypertension    Stroke (HCC) 01/09/2024   Stated he had 2 strokes.   Past Surgical History:  Procedure Laterality Date   BREAST BIOPSY Right 03/15/2024   US  RT BREAST BX W LOC DEV 1ST LESION IMG BX SPEC US  GUIDE 03/15/2024 [facility]   TEE WITHOUT CARDIOVERSION N/A 01/14/2024   Procedure: ECHOCARDIOGRAM, TRANSESOPHAGEAL;  Surgeon: Alluri, Keller BROCKS, MD;  Location: [facility] ORS;  Service: Cardiovascular;  Laterality: N/A;   Patient Active Problem List   Diagnosis Date Noted   Gait disturbance, post-stroke 06/11/2024   Hemiparesis affecting left side as late effect of cerebrovascular accident (HCC) 04/01/2024   Mass of lower outer quadrant of right breast 02/27/2024   History of cerebrovascular accident (CVA) with residual deficit 02/27/2024   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) 02/27/2024   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) 02/27/2024   Gait abnormality 02/27/2024   Left-sided weakness 02/27/2024   Prediabetes 02/27/2024   Abnormal pulse oximetry 02/27/2024   Vitamin D  deficiency 02/27/2024   Bradycardia 02/27/2024   Depression, major, single episode, moderate (HCC) 02/27/2024    Coping style affecting medical condition 01/20/2024   Tobacco use disorder 01/09/2024   Obesity (BMI 30-39.9) 01/09/2024   Hypertension     ONSET DATE: 01/09/24  REFERRING DIAG: I63.9 (ICD-10-CM) - Acute CVA (cerebrovascular accident) (HCC)   THERAPY DIAG:  Muscle weakness (generalized)  Other lack of coordination  Difficulty in walking, not elsewhere classified  Unsteadiness on feet  Other abnormalities of gait and mobility  Rationale for Evaluation and Treatment: Rehabilitation  SUBJECTIVE:  SUBJECTIVE STATEMENT:  Pt reports he is doing well. A little sore in his lower back from Pulling over the weekend, but states that he doesn't know how he pulled it. Is doing a little better today.   PERTINENT HISTORY: [facility] ED 01/09/24 for progressively worsening L sided-weakness. multiple acute infarcts concerning for embolic showering. PMH includes arthritis, HTN. 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   PAIN:  Are you having pain? No  PRECAUTIONS: None  WEIGHT BEARING RESTRICTIONS: No  FALLS: Has patient fallen in last 6 months? Yes. Number of falls 1- pt reports that he tried to get up at home after returning from [place]PR and he got up too fast and fell. Pt also reports stumbling on steps at home  LIVING ENVIRONMENT: Lives with: lives alone- pt's son was staying with him until this past Friday- son lives in [place], pt has friends that come by every other day Lives in: House/apartment Stairs: Yes: External: 4 steps; can reach both Has following equipment at home: Walker - 4 wheeled and shower chair  PLOF: Independent- pt works with mixing chemicals and dyes  PATIENT GOALS: Pt wants to get back to normal and be able to  return to work  OBJECTIVE:  Note: Objective measures were completed at Evaluation unless otherwise noted.  DIAGNOSTIC FINDINGS: via chart  From 01/09/24 MRI Brain W/O Contrast: IMPRESSION: 1. Acute infarcts in the posterior limb of the right internal capsule, the overlying right frontal white matter, and pons. Given involvement of multiple vascular territories, consider an embolic etiology. 2. Multiple remote infarcts and chronic microvascular ischemic disease, detailed above.  From 01/10/24 CT Angio Head Neck W W/O CM: IMPRESSION: 1. No acute intracranial hemorrhage or mass effect. 2. No large vessel occlusion, hemodynamically significant stenosis, or aneurysm in the head or neck. 3. Remote lacunar infarcts in the genu of the left internal capsule, globus pallidus, and right corona radiata. 4. Acute infarct involving the posterior limb of the right internal capsule is less well appreciated at CT. 5. Moderately advanced periventricular white matter disease for age.   MMT:  MMT Right Eval Left Eval  Hip flexion 4- 3+  Hip extension      Hip abduction 5 5  Hip adduction 4+ 4+  Hip internal rotation      Hip external rotation      Knee flexion 5 4+  Knee extension 5 4+  Ankle dorsiflexion 5 4-  Ankle plantarflexion 5 4-  Ankle inversion      Ankle eversion      (Blank rows = not tested)  FUNCTIONAL TESTS (at eval):  5 times sit-to-stand: 17.9 seconds, pt utilizing arms on knees to assist with standing : 0.56 m/s with 4WW and CGA; 0.73 m/s with no AD and CGA BERG Balance Scale: 33/56 FGA: 18/30 on 02/25/24 : 795 ft with 4WW on 02/25/24  PATIENT SURVEYS (eval):  Stroke Impact Scale: 45/80  TREATMENT DATE: 06/15/24   Amb through PT department without AD x 298ft. Short therapeutic rest break. Aura carry with 7# DB x 421ft. With CGA for  safety, mild decrease in step length/fatigue in RLE.   Obstacle navigation through course to step across red mat up/across/down 8 inch aerobic step, over 2 hurdles, over 2 close half bolsters and across airex pad. Performed without additional resistance x 4 laps clockwise then performed with 7# DB x 4clockwise then performed with 5# AW x 2 laps counterclockwise. CGA overall except for on wide airex beam   Stairs carrying 6# med ball in basket 4 steps x 6 bouts cues for proper step length to maintain full foot contact with step.     AMB holding basket with 6# med ball through hall x 451ft.   Matrix cable walk forward/R/L x 4 each with 12.5# CGA for safety with forward and intermittent min assist for eccentric control with R and L Lateral stepping. Cues for improved step length to increase eccentric control   CGA provided throughout session for safety with all dynamic balance training unless otherwise noted throughout session.     PATIENT EDUCATION: Education details:  No longer requires [place].   Pt has made remarkable toward ADL based goals, however a massive gap remains between activities he has done here in clinic and what he will need to do to prepare for return to work duties.  Person educated: Patient Education method: Explanation, Demonstration, and Handouts Education comprehension: verbalized understanding and returned demonstration  HOME EXERCISE PROGRAM: Access Code: [email or web address]  URL: [email or web address]  Date: 02/17/2024  Prepared by: Darryle Patten  Exercises: - Seated March  - 1 x daily - 3 x weekly - 3 sets - 10 reps  - Standing March with Counter Support  - 1 x daily - 3 x weekly - 3 sets - 10 reps  - Mini Squat with Counter Support  - 1 x daily - 3 x weekly - 3 sets - 10 reps  - Heel Toe Raises with Counter Support  - 1 x daily - 3 x weekly - 3 sets - 10 reps   GOALS: Goals reviewed with patient? Yes  SHORT TERM GOALS: Target date: 06/04/24  Patient  will be independent in home exercise program to improve strength/mobility for better functional independence with ADL. Baseline: HEP reviewed during eval Goal status: ACHIEVED  LONG TERM GOALS: Target date: 07/08/24  Patient will increase their Stroke Impact Scale score by >10 points for improved function and independence Baseline: 45/80 8/13: 70/80 Goal status: MET   2.  Patient (< 11 years old) will complete five times sit to stand test in < 10 seconds indicating an increased LE strength and improved balance. Baseline: 17.9 seconds with UE support; 03/29/24: 11.7 hands free; 06/08/2024=11.2 sec without UE support  Goal status: progress made, goal not met   3.  Patient will increase 10 meter walk test to >0.8 m/s as to improve gait speed for better community ambulation and to reduce fall risk. Baseline: 0.56 m/s with 5TT & 0.73 m/s with no AD ; 8/12: 1.31m/s  Goal status: ACHIEVED  4.  Patient will increase Berg Balance score by > 6 points to demonstrate decreased fall risk during functional activities. Baseline: 33/56; 03/30/24: 54/56 Goal status: ACHIEVED,    5.  Patient will increase Functional Gait Assessment score to >20/30 as to reduce fall risk and improve dynamic gait safety with community ambulation. Baseline:18/30 on 02/25/24; 03/30/24: 27/30 Goal status: ACHIEVED  6.  Patient will increase six minute walk test distance to > 1569ft ft for progression to limited community Ambulator and improved independence with access to community and occupation  Baseline: 745ft with 4WW on 02/25/24; 03/11/24: 9106ft c SPC(MET >52ft improvement); 06/08/2024= 1200 feet Goal status: MET: adjusted. NEW.   7. Pt will increase SLS to > 20 sec bil to improve safety with ADLs and IADLs including showering, shopping, and work related tasks.   Baseline: 8/13: 4 sec on the R, 3 sec on the L;   06/08/2024= 17 on R; 20 sec on L  Goal status: Progressing  ASSESSMENT:  CLINICAL IMPRESSION:  Patient  presents with good overall motivation today. He continues to work on overall functional strength with carrying tasks over various surfaces and situation with limited visualization of BLE. Greatest difficulty with tandem gait over airex pad. Will continue to target function and work related tasks. Pt will benefit from continued therapy in order to address his impairments, and improve his ability to return to work-related activities, and to improve his overall quality of life.  OBJECTIVE IMPAIRMENTS: Abnormal gait, decreased activity tolerance, decreased balance, decreased coordination, decreased endurance, decreased mobility, difficulty walking, decreased ROM, decreased strength, hypomobility, impaired perceived functional ability, impaired flexibility, impaired UE functional use, improper body mechanics, and postural dysfunction.   ACTIVITY LIMITATIONS: carrying, lifting, bending, sitting, standing, squatting, stairs, transfers, bathing, reach over head, hygiene/grooming, and locomotion level  PARTICIPATION LIMITATIONS: meal prep, cleaning, laundry, driving, shopping, community activity, occupation, and yard work  PERSONAL FACTORS: Age and Fitness are also affecting patient's functional outcome.   REHAB POTENTIAL: Good  CLINICAL DECISION MAKING: Evolving/moderate complexity  EVALUATION COMPLEXITY: Moderate  PLAN:  PT FREQUENCY: 2x/week  PT DURATION: 12 weeks  PLANNED INTERVENTIONS: 97164- PT Re-evaluation, 97750- Physical Performance Testing, 97110-Therapeutic exercises, 97530- Therapeutic activity, [id number]- Neuromuscular re-education, 97535- Self Care, 02859- Manual therapy, [id number]- Gait training, [id number]- Orthotic Initial, [id number]- Orthotic/Prosthetic subsequent, [phone]- Canalith repositioning, [id number]- Splinting, [id number]- Electrical stimulation (manual), [id number]- Ultrasound, [id number]- Traction (mechanical), [phone] (1-2 muscles), [id number] (3+ muscles)- Dry Needling, Patient/Family education, Balance training,  Stair training, Taping, Joint mobilization, Spinal mobilization, Vestibular training, Visual/preceptual remediation/compensation, Cognitive remediation, DME instructions, Cryotherapy, and Moist heat  PLAN FOR NEXT SESSION:   Work ready activities- lifting, progressive LE strengthening- gym based activities as well.   Massie FORBES Dollar PT  Physical Therapist- [place]  [facility]   3:13 PM 06/24/24

## 2024-06-29 ENCOUNTER — Encounter: Attending: Family Medicine | Admitting: Dietician

## 2024-06-29 ENCOUNTER — Ambulatory Visit

## 2024-06-29 ENCOUNTER — Ambulatory Visit: Admitting: Physical Therapy

## 2024-06-29 ENCOUNTER — Encounter: Payer: Self-pay | Admitting: Dietician

## 2024-06-29 ENCOUNTER — Encounter: Admitting: Speech Pathology

## 2024-06-29 VITALS — Ht 71.0 in | Wt 231.8 lb

## 2024-06-29 DIAGNOSIS — R7303 Prediabetes: Secondary | ICD-10-CM

## 2024-06-29 DIAGNOSIS — R2689 Other abnormalities of gait and mobility: Secondary | ICD-10-CM

## 2024-06-29 DIAGNOSIS — M6281 Muscle weakness (generalized): Secondary | ICD-10-CM

## 2024-06-29 DIAGNOSIS — R278 Other lack of coordination: Secondary | ICD-10-CM

## 2024-06-29 DIAGNOSIS — I639 Cerebral infarction, unspecified: Secondary | ICD-10-CM

## 2024-06-29 DIAGNOSIS — R2681 Unsteadiness on feet: Secondary | ICD-10-CM

## 2024-06-29 DIAGNOSIS — R262 Difficulty in walking, not elsewhere classified: Secondary | ICD-10-CM

## 2024-06-29 DIAGNOSIS — E669 Obesity, unspecified: Secondary | ICD-10-CM

## 2024-06-29 NOTE — Progress Notes (Signed)
 Medical Nutrition Therapy  Appointment Start time:  [phone]  Appointment End time:  1050  Primary concerns today: Weight Loss  Referral diagnosis: R73.03 (ICD-10-CM) - Prediabetes, E66.9 (ICD-10-CM) - Obesity (BMI 30-39.9) Preferred learning style: Auditory, Visual Learning readiness: Change in progress   NUTRITION ASSESSMENT   Anthropometrics Ht: 71 Wt: 231.8 BMI: 32.33 kg/m2 Goal Weight: 210 lbs  Clinical Medical Hx: Obesity, Prediabetes, HLD, HTN, CVA w/ residual hemiparesis L side Medications: Jardiance, Atorvastatin , Irbesartan , Amlodipine , Vit D Labs: A1c - 5.9% Notable Signs/Symptoms: L side hemiparesis  Lifestyle & Dietary Hx Pt reports CVA in May of 2025, states they have been adhering to a heart healthy diet since, cut out sugar/SSBs, choosing whole grains.  Pt reports fasting from 12:00 am to 12:00 pm three times per week since CVA, states they are doing this for religious and mental health reasons. Pt reports doing rehabilitation twice a week, states they are getting more strength back and feel much more like themselves, states they successfully completed speech therapy in September as well. 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 Pt reports riding stationary bike at home that they ride 2x a week for 30 minutes,  has 35 lb dumbbell they try to lift/grip with L hand/arm. Pt reports previously taking metformin  for prediabetes, was switched to Jardiance after CVA.   Estimated daily fluid intake: 64 oz Supplements: Beet root gummies Sleep: Sleeping more during the day since CVA Stress / self-care: Low Current average weekly physical activity: ADLs, Rehabilitation 2x a week (T, Th)  24-Hr Dietary Recall First Meal: Honey Nut Cheerios, 2% milk  Snack:  Second Meal:  Snack:  Third Meal: Spaghetti w/ meat sauce, tossed salad (lettuce, tomato, onion, cucumber, crouton, boiled egg, cheese, thousand island) Snack:   Beverages: Water, Sprite ZERO   NUTRITION DIAGNOSIS  NB-1.1 Food and nutrition-related knowledge deficit As related to CVA.  As evidenced by acute CVA, previous diet history high in saturated fats, sodium..   NUTRITION INTERVENTION  Nutrition education (E-1) on the following topics:  Educated patient on the relationship between dietary sodium intake and hypertension. Educated patient on common sources of high sodium foods including packaged/processed foods, deli meats, fast foods, pickled food, sports drinks, and canned foods. Educated patient on the combined effect of hypertension and elevated cholesterol on cardiovascular health. Educated patient on the positive impact of physical activity in lowering blood pressure and improving cardiovascular health.  Educate pt on factors that can elevate LDL cholesterol, including high dietary intake of saturated fats. Educate pt on identifying sources of saturated fats, and how to make alternative food choices to lower saturated fat intake. Educate pt on the role of soluble fiber in binding to cholesterol in the GI tract an eliminating it from the body. Educate pt on dietary sources of soluble fiber.  Educate on the role of elevated LDL,total cholesterol, and triglycerides on cardiovascular health. Educate pt on the role of physical activity in lowering LDL and increasing HDL cholesterol.   Handouts Provided Include  Cardiac TLC Nutrition Therapy Heart Healthy Label Reading Tips Fats  Food List  Learning Style & Readiness for Change Teaching method utilized: Visual & Auditory  Demonstrated degree of understanding via: Teach Back  Barriers to learning/adherence to lifestyle change: None  Goals Established by Pt Choose low fat or fat-free proteins as often as possible. When having ground meats, choose ground turkey or chicken, or ground beef that is 90/10 or better! Ideally,  we want 93/7 or 97/3 ground meats. Choose Fairlife brand fat-free (Light  Blue) milk for your cereal.  When having rice or pasta, keep you portion size to no larger than your fist. Choose canned beans (pintos/black/chili/kidney/white/lima) with NO SALT ADDED in place of baked beans. Visit https://jacobson-moore.net/ for a large database of heart healthy recipes! Choose dried herbs and spices, and Mrs. Dash seasoning blends to season your food with minimal added salt!   MONITORING & EVALUATION Dietary intake, weekly physical activity, and weight loss in 6-8 weeks.  Next Steps  Patient is to follow up with RD.

## 2024-06-29 NOTE — Therapy (Signed)
 [facility] Outpatient Rehabilitation at [facility] [address] [place], [place], [address] Phone: [phone]   Fax:  [phone]  Patient Details  Name: Mark Mcintosh MRN: [id number] Date of Birth: 1971/04/24 Referring Provider:  Wellington Curtis LABOR, FNP  Encounter Date: 06/29/2024   Pt has elected to limit PT visits to maximize OT treatment with insurance limitations through the end of year.   No PT treatment provided on this day. Pt will continue to benefit from skilled PT to address strength, balance, and mobility deficits following CVA.  Will plan to request additional PT visits to continue treatment plan. If pt is denied additional PT Visits, will d/c from PT Services.    Massie FORBES Dollar, PT 06/29/2024, 3:10 PM  [facility] Outpatient Rehabilitation at [facility] [address] [place], [place], [address] Phone: [phone]   Fax:  [phone]

## 2024-06-29 NOTE — Patient Instructions (Addendum)
 Choose low fat or fat-free proteins as often as possible. When having ground meats, choose ground turkey or chicken, or ground beef that is 90/10 or better! Ideally, we want 93/7 or 97/3 ground meats.  Choose Fairlife brand fat-free (Light Blue) milk for your cereal.   When having rice or pasta, keep you portion size to no larger than your fist.  Choose canned beans (pintos/black/chili/kidney/white/lima) with NO SALT ADDED in place of baked beans.  Visit https://jacobson-moore.net/ for a large database of heart healthy recipes!  Choose dried herbs and spices, and Mrs. Dash seasoning blends to season your food with minimal added salt!

## 2024-07-01 ENCOUNTER — Encounter: Admitting: Speech Pathology

## 2024-07-01 ENCOUNTER — Ambulatory Visit

## 2024-07-01 DIAGNOSIS — I639 Cerebral infarction, unspecified: Secondary | ICD-10-CM

## 2024-07-01 DIAGNOSIS — M6281 Muscle weakness (generalized): Secondary | ICD-10-CM

## 2024-07-01 DIAGNOSIS — R278 Other lack of coordination: Secondary | ICD-10-CM

## 2024-07-01 NOTE — Therapy (Signed)
 OUTPATIENT OCCUPATIONAL THERAPY NEURO PROGRESS AND TREATMENT NOTE Reporting period beginning 05/25/24-07/01/24  Patient Name: Mark Mcintosh MRN: [id number] DOB:Apr 07, 1971, 53 y.o., male Today's Date: 07/04/2024  PCP: No PCP REFERRING PROVIDER: [name], PA-C  END OF SESSION:   OT End of Session - 07/04/24 1359     Visit Number 30    Number of Visits 41    Date for Recertification  08/05/24    Progress Note Due on Visit 30    OT Start Time 1400    OT Stop Time 1445    OT Time Calculation (min) 45 min    Activity Tolerance Patient tolerated treatment well    Behavior During Therapy [facility] for tasks assessed/performed         Past Medical History:  Diagnosis Date   Acute CVA (cerebrovascular accident) (HCC) 01/09/2024   Arthritis    Hypertension    Stroke (HCC) 01/09/2024   Stated he had 2 strokes.   Past Surgical History:  Procedure Laterality Date   BREAST BIOPSY Right 03/15/2024   US  RT BREAST BX W LOC DEV 1ST LESION IMG BX SPEC US  GUIDE 03/15/2024 [facility]   TEE WITHOUT CARDIOVERSION N/A 01/14/2024   Procedure: ECHOCARDIOGRAM, TRANSESOPHAGEAL;  Surgeon: Alluri, Keller BROCKS, MD;  Location: [facility] ORS;  Service: Cardiovascular;  Laterality: N/A;   Patient Active Problem List   Diagnosis Date Noted   Gait disturbance, post-stroke 06/11/2024   Hemiparesis affecting left side as late effect of cerebrovascular accident (HCC) 04/01/2024   Mass of lower outer quadrant of right breast 02/27/2024   History of cerebrovascular accident (CVA) with residual deficit 02/27/2024   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) 02/27/2024   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) 02/27/2024   Gait abnormality 02/27/2024   Left-sided weakness 02/27/2024   Prediabetes 02/27/2024   Abnormal pulse oximetry 02/27/2024   Vitamin D  deficiency 02/27/2024   Bradycardia 02/27/2024   Depression, major, single episode, moderate (HCC)  02/27/2024   Coping style affecting medical condition 01/20/2024   Tobacco use disorder 01/09/2024   Obesity (BMI 30-39.9) 01/09/2024   Hypertension    ONSET DATE: 01/09/24  REFERRING DIAG: I63.9 (ICD-10-CM) - Acute CVA (cerebrovascular accident) (HCC)   THERAPY DIAG:  Muscle weakness (generalized)  Other lack of coordination  Acute CVA (cerebrovascular accident) (HCC)  Rationale for Evaluation and Treatment: Rehabilitation  SUBJECTIVE:  SUBJECTIVE STATEMENT: Pt reports doing well today. Pt accompanied by: self  PERTINENT HISTORY: Per chart:  Mark Mcintosh is a 53 y.o. right-handed male with history significant for bilateral conjunctival inflammation hypertension as well as tobacco use and class II morbid obesity with BMI 36.26.  Per chart review patient lives alone independent prior to admission.  Presented to [facility] 01/09/2024 with acute onset of left-sided weakness.  MRI showed acute infarct in the posterior limb of the right internal capsule, the overlying right frontal white matter and pons.  Multiple remote infarcts and chronic microvascular ischemic disease.  CTA showed no large vessel occlusion.  Admission chemistries unremarkable except potassium 3.1.  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  TEE with small PFO per cardiology services with no plan for closure.  Neurology follow-up placed on aspirin  and Plavix  for CVA prophylaxis x 3 weeks then aspirin  alone.  Lovenox  for DVT prophylaxis but bilateral Doppler studies negative.  Therapy evaluations completed due to patient decreased functional mobility left-sided weakness was admitted for a comprehensive rehab program.   PRECAUTIONS: Fall  WEIGHT BEARING RESTRICTIONS: No  PAIN: 07/01/24: no pain today  Are you having pain? 0/10  FALLS: Has patient fallen in last 6 months? Yes. 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  Get back my independence.    OBJECTIVE:  Note: Objective measures were completed at Evaluation unless otherwise noted.  HAND DOMINANCE: Right  ADLs: Overall ADLs: Son was staying with pt for about a week after d/c from inpatient rehab, but has since returned home.  Friend comes by every other day to check in.   Transfers/ambulation related to ADLs: modified indep with rollator Eating: increased difficulty with cutting food  Grooming: increased difficulty with clipping nails, otherwise manages fine with dominant/unaffected hand UB Dressing: increased time with clothing fasteners LB Dressing: increased time with clothing fasteners  Toileting: increased time to engage L hand into clothing management Bathing: pt sits on shower chair to wash LEs, some SOB with standing in shower Tub Shower transfers: Modified indep Equipment: Shower seat without back  IADLs: Shopping: motorized cart, can push cart for shorter shopping trips.  Light housekeeping: extra time and cautions to avoid falls, sometimes feels SOB with laundry  Meal Prep: friend helps with stove top cooking, pt can do light hot and cold meal prep  Community mobility: rollator, friend drove pt  Medication management: indep  Landscape architect: indep  Handwriting: NT; L non-dominant hand affected   MOBILITY STATUS: Hx of falls, rollator or funiture/wall walking in the home, rollator for community  POSTURE COMMENTS:  L sided hemiparesis   ACTIVITY TOLERANCE: Activity tolerance: Pt reports some dyspnea with prolonged standing/mobility  UPPER EXTREMITY ROM:  BUEs [facility]  UPPER EXTREMITY MMT:     MMT Right eval Left eval Left 03/31/24 Left 05/13/24  Left 07/01/24  Shoulder flexion 5 4- 4- 4+ 4+  Shoulder abduction 5 4- 4- 4+  4+  Shoulder adduction       Shoulder extension       Shoulder internal rotation   4- 4+  4+  Shoulder external rotation   3+ 4 4  Middle trapezius       Lower trapezius       Elbow flexion 5 4- 4+ 5 5  Elbow extension 5 4- 4+ 5 5  Wrist flexion 5 4- 4+ 5  5  Wrist extension 5 4- 4 4+ 4+  Wrist ulnar deviation       Wrist radial deviation       Wrist pronation 5 4- 4+ 4+  4+  Wrist supination 5 4- 4+ 4+  4+  (Blank rows = not tested)  HAND FUNCTION: Eval: Grip strength: Right: 111 lbs; Left: 51 lbs, Lateral pinch: Right: 23 lbs, Left: 13 lbs, and 3 point pinch: Right: 26 lbs, Left: 13 lbs 03/31/24: Grip strength: Right: 101 lbs, Left: 31 lbs, lateral pinch: Right: 23 lbs, Left: 13 lbs, 3 point pinch: Right: 25 lbs, Left: 14 lbs  04/06/24: Grip strength: Left: 32 lbs 04/13/24: Grip strength: Left: 45 lbs  05/04/24: Grip strength: Left: 24 lbs before stretching hand; 40 lbs after stretching; Lateral pinch: Left: 15  lbs, 3 point pinch: Left: 14 lbs  05/13/24: Grip strength: Left: 44 lbs; Lateral pinch: Left: 16 lbs, 3 point pinch: Left: 13 lbs  05/25/24: Grip strength: Left: 44 lbs, Lateral pinch: Left: 16 lbs, 3 point pinch: Left: 11 lbs (measured on [place]; previously measured on standard) 06/08/24: Grip strength: Left: 46 lbs, 44 lbs, 45 lbs (3 trials) (average 45#) 06/10/24: Grip strength: Left: 47#, 53#, 51# (3 trials) (average 50.3#) 07/01/24: Grip strength: Left: 55#, 50#, 50# (average (51.6#); Lateral pinch: Left: 21 lbs, 23 lbs (measured on [place])   COORDINATION: Finger Nose Finger test: increased time and decreased accuracy on the L  9 Hole Peg test: Right: 25 sec; Left: 1 min 27 sec 03/31/24: L 52 sec, 1 min 4 sec, 39 sec  05/13/24: 43 sec  05/25/24: 36 sec (after multiple trials)  07/01/24: 45, 39, 37 sec on 3rd trial  05/13/24: Finger knee to nose 5x: R: 5 sec, L: 6 sec, mild dysmetria 05/25/24: L 6  sec, mild dysmetria   SENSATION: Pt reports increased tingling/numbness when hand balls up a little, but when he straightens it out it goes back to normal.   EDEMA: no visible edema   MUSCLE TONE: LUE: Mild   COGNITION: Overall cognitive status: Within functional limits for tasks assessed pt reports difficulties with memory   VISION:             Subjective report: Wears glasses all the time.  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                                                                                                             TREATMENT DATE: 07/01/24 Therapeutic Activity: -Objective measures taken and goals updated for progress note.  Neuro re-ed: -Facilitated LUE FMC/dexterity skills working on environmental health practitioner working to pick up washers from [employer], store up to 3 in hand, and discard 1 by 1 by moving them palm to fingertips in prep for placing washers onto vertical dowel.  Pt also practiced picking up washers from table top without non-skid surface.  Self Care: -Focus on use of L hand to pour cold liquids from heavy glass measuring dish into small paper cups, with OT providing intermittent assist to stabilize cup from tipping, and bimanual pouring of liquids between 1 large and 1 medium cooking pot with SBA.  Pt practiced filling up pots 50-75% full and moving pots between sink and stove top.   PATIENT EDUCATION: Education details:  Progress towards goals  Person educated: Patient Education method: Verbal cues Education  comprehension: verbalized understanding  HOME EXERCISE PROGRAM: Green theraputty 2-3x per day for 5-10 min periods for L hand grip  strengthening, [facility] handout Tendon glides, passive stretching to the L wrist/digits, L hand AROM  GOALS: Goals reviewed with patient? Yes  SHORT TERM GOALS: Target date: 03/30/24  Pt will be indep to perform HEP for increasing strength and coordination throughout the LUE. Baseline: Eval: Not yet initiated; 03/31/24: Indep with HEP and verbalizes understanding of progressions made today for frequency Goal status: achieved  LONG TERM GOALS: Target date: 08/05/24  Pt will increase LUE MMT by 1/2 grade or more to increase engagement of LUE into ADL/IADLs. Baseline: Eval: LUE grossly 4-/5 throughout (R 5/5); 03/31/24: L shoulder grossly 4-, elbow flex 4+, ext 4, forearm pron/sup 4+; 05/13/24: L shoulder 4 to 4+/5, elbow 5/5, wrist 4+ to 5/5 Goal status: achieved  2.  Pt will increase L grip strength by 20 lbs or more to enable pt to grasp and carry heavy ADL supplies in L hand. Baseline: Eval: L 51 lbs (R 111 lbs); 03/31/24: L grip 31 lbs (R 101 lbs); 05/13/24: L grip 44 lbs (edema in L hand has caused fluctuations in hand strength since eval); 05/25/24: L grip 44 lbs; 07/01/24: L grip 51.6# (average of 3 trials) Goal status: in progress  3.  Pt will increase L hand [facility] skills as demonstrated by completion of 9 hole peg test in 30 sec or less to improve manipulation of small ADL supplies. Baseline: Eval: L 1 min 27 sec (R 25 sec); 03/31/24: 3 trials completed today: L 52 sec, 1 min 4 sec, 39 sec; 05/13/24: L 43 sec (R 25 sec); 05/25/24: L 36 sec (after several trials); 07/01/24: L 37 sec (best of 3 trials) Goal status: in progress  4.  Pt will increase LUE GMC to enable confidence with moving hot pots/pans on/off stove top and in/out of the oven using BUEs. Baseline: Eval: Pt reports that his friend currently assists with heavier meal prep; 03/31/24: Pt is now cooking  independently, but with increased time/caution; 05/13/24: LUE GMC has greatly improved, though pt still demos mild dysmetria and must use caution when lifting/carrying items noted above; 05/25/24: mild dysmetria/continues to use caution; 07/01/24: mild dysmetria, occasional spilling, continues to use caution Goal status: in progress  5. Pt will increase tolerance for engaging the LUE into sustained pushing/grasping manuevers to enable push mowing yard in <45 min.  Baseline: Recert 05/13/24: Pt reports he prefers family to mow his lawn as it takes him increased time, estimating 45-60 min (brother mows in  30 min; 05/25/24: Limited mowing since L foot metatarsal fx; 07/01/24: Pt reports he has not mowed his lawn recently  Goal status: in progress   6.  Pt will efficiently pick up flat items from table top using L hand.  Baseline: Recert 05/13/24: Pt requires extra time/repeat trials with attempts at picking up coins, flat stones, paper from table top when using L hand; 05/25/24: extra time for above (see 05/13/24); 07/01/24: Pt consistently demonstrates ability to pick up flat items (washers/coins) from table top using L hand without non-skid surface.  Goal status: achieved  ASSESSMENT: CLINICAL IMPRESSION: Pt seen for 30th visit progress update.  Pt demonstrates some improvement in L grip strength from last assessment period, and does report he has increased use of putty at home.  L grip is still grossly 50% of the R dominant hand.  Pt is showing some mild improvements in L GMC, noting good ability to move pots/pans and poor liquids bimanual and using L hand only with only occasional spilling, but caution and pacing continues  to be required to limit spilling.  FMC measures show a plateau from last assessment period which was about 1 month ago.  Though pt is showing improved ability to pick up flat items from table top using L hand, he continues to show difficulty with translatory movements when storing  multiple items in hand and moving them from palm to fingertips, as frequent dropping occurs.  Pt continues to benefit from skilled OT to work towards above noted goals for improving indep with daily tasks, return to work, to reduce burden of care on caregivers, and improve QOL.    PERFORMANCE DEFICITS: in functional skills including ADLs, IADLs, coordination, dexterity, sensation, tone, strength, Fine motor control, Gross motor control, mobility, balance, body mechanics, endurance, decreased knowledge of precautions, decreased knowledge of use of DME, vision, and UE functional use, cognitive skills including memory, and psychosocial skills including coping strategies, environmental adaptation, and routines and behaviors.   IMPAIRMENTS: are limiting patient from ADLs, IADLs, work, and leisure.   CO-MORBIDITIES: has co-morbidities such as tobacco use disorder, HTN, arthritis that affects occupational performance. Patient will benefit from skilled OT to address above impairments and improve overall function.  MODIFICATION OR ASSISTANCE TO COMPLETE EVALUATION: No modification of tasks or assist necessary to complete an evaluation.  OT OCCUPATIONAL PROFILE AND HISTORY: Detailed assessment: Review of records and additional review of physical, cognitive, psychosocial history related to current functional performance.  CLINICAL DECISION MAKING: Moderate - several treatment options, min-mod task modification necessary  REHAB POTENTIAL: Good  EVALUATION COMPLEXITY: Moderate  PLAN:  OT FREQUENCY: 2x/week  OT DURATION: 12 weeks  PLANNED INTERVENTIONS: 97168 OT Re-evaluation, 97535 self care/ADL training, 02889 therapeutic exercise, 97530 therapeutic activity, 97112 neuromuscular re-education, 97140 manual therapy, 97116 gait training, 02989 moist heat, 97010 cryotherapy, 97750 Physical Performance Testing, passive range of motion, balance training, functional mobility training, visual/perceptual  remediation/compensation, psychosocial skills training, energy conservation, coping strategies training, patient/family education, and DME and/or AE instructions  RECOMMENDED OTHER SERVICES: SLP eval and treat d/t pt reporting memory deficits since CVA, drooling, and slurred speech  CONSULTED AND AGREED WITH PLAN OF CARE: Patient  PLAN FOR NEXT SESSION: see above  Inocente Blazing, MS, OTR/L   07/04/2024, 2:01 PM

## 2024-07-02 ENCOUNTER — Other Ambulatory Visit: Payer: Self-pay | Admitting: Family Medicine

## 2024-07-02 ENCOUNTER — Telehealth: Payer: Self-pay

## 2024-07-02 DIAGNOSIS — I69354 Hemiplegia and hemiparesis following cerebral infarction affecting left non-dominant side: Secondary | ICD-10-CM

## 2024-07-02 DIAGNOSIS — I6930 Unspecified sequelae of cerebral infarction: Secondary | ICD-10-CM

## 2024-07-02 DIAGNOSIS — Z560 Unemployment, unspecified: Secondary | ICD-10-CM

## 2024-07-02 DIAGNOSIS — I10 Essential (primary) hypertension: Secondary | ICD-10-CM

## 2024-07-02 DIAGNOSIS — R7303 Prediabetes: Secondary | ICD-10-CM

## 2024-07-02 NOTE — Telephone Encounter (Unsigned)
  SW received phone call from patient asking for guidance on what he should do as he was informe dby his job   Graeme Jude, MSW, [employer] Office: [phone] Cell: [phone] Fax: [phone]

## 2024-07-03 NOTE — Therapy (Signed)
 OUTPATIENT OCCUPATIONAL THERAPY NEURO TREATMENT NOTE  Patient Name: Mark Mcintosh MRN: [id number] DOB:[birth year], 53 y.o., male Today's Date: 07/03/2024  PCP: No PCP REFERRING PROVIDER: [name], PA-C  END OF SESSION:   OT End of Session - 06/29/24       Visit Number 29     Number of Visits 41     Date for Recertification  08/05/24     Progress Note Due on Visit 30     OT Start Time 0245p    OT Stop Time  0330p    OT Time Calculation (min) 45 min     Activity Tolerance Patient tolerated treatment well     Behavior During Therapy [facility] for tasks assessed/performed     Past Medical History:  Diagnosis Date   Acute CVA (cerebrovascular accident) (HCC) 01/09/2024   Arthritis    Hypertension    Stroke (HCC) 01/09/2024   Stated he had 2 strokes.   Past Surgical History:  Procedure Laterality Date   BREAST BIOPSY Right 03/15/2024   US  RT BREAST BX W LOC DEV 1ST LESION IMG BX SPEC US  GUIDE 03/15/2024 [facility]   TEE WITHOUT CARDIOVERSION N/A 01/14/2024   Procedure: ECHOCARDIOGRAM, TRANSESOPHAGEAL;  Surgeon: Alluri, Keller BROCKS, MD;  Location: [facility] ORS;  Service: Cardiovascular;  Laterality: N/A;   Patient Active Problem List   Diagnosis Date Noted   Gait disturbance, post-stroke 06/11/2024   Hemiparesis affecting left side as late effect of cerebrovascular accident (HCC) 04/01/2024   Mass of lower outer quadrant of right breast 02/27/2024   History of cerebrovascular accident (CVA) with residual deficit 02/27/2024   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) 02/27/2024   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) 02/27/2024   Gait abnormality 02/27/2024   Left-sided weakness 02/27/2024   Prediabetes 02/27/2024   Abnormal pulse oximetry 02/27/2024   Vitamin D  deficiency 02/27/2024   Bradycardia 02/27/2024   Depression, major, single episode, moderate (HCC) 02/27/2024   Coping style affecting medical condition  01/20/2024   Tobacco use disorder 01/09/2024   Obesity (BMI 30-39.9) 01/09/2024   Hypertension    ONSET DATE: 01/09/24  REFERRING DIAG: I63.9 (ICD-10-CM) - Acute CVA (cerebrovascular accident) (HCC)   THERAPY DIAG:  Muscle weakness (generalized)  Other lack of coordination  Acute CVA (cerebrovascular accident) (HCC)  Rationale for Evaluation and Treatment: Rehabilitation  SUBJECTIVE:  SUBJECTIVE STATEMENT: Pt reports back seems fully healed after a pulled muscle last week.  Pt accompanied by: self  PERTINENT HISTORY: Per chart:  Mark Mcintosh is a 53 y.o. right-handed male with history significant for bilateral conjunctival inflammation hypertension as well as tobacco use and class II morbid obesity with BMI 36.26.  Per chart review patient lives alone independent prior to admission.  [facility]ed to [facility]/09[facility] with acute onset of left-sided weakness.  MRI showed acute infarct in the posterior limb of the right internal capsule, the overlying right frontal white matter and pons.  Multiple remote infarcts and chronic microvascular ischemic disease.  CTA showed no large vessel occlusion.  Admission chemistries unremarkable except potassium 3.1.  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  TEE with small PFO per cardiology services with no plan for closure.  Neurology follow-up placed on aspirin  and Plavix  for CVA prophylaxis x 3 weeks then aspirin  alone.  Lovenox  for DVT prophylaxis but bilateral Doppler studies negative.  Therapy evaluations completed due to patient decreased functional mobility left-sided weakness was admitted for a comprehensive rehab program.   PRECAUTIONS: Fall  WEIGHT BEARING RESTRICTIONS: No  PAIN: 06/29/24: no pain Are you having pain? 0/10  FALLS: Has patient fallen in last 6 months? Yes. 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  Get back my independence.    OBJECTIVE:  Note: Objective measures were completed at Evaluation unless otherwise noted.  HAND DOMINANCE: Right  ADLs: Overall ADLs: Son was staying with pt for about a week after d/c from inpatient rehab, but has since returned home.  Friend comes by every other day to check in.   Transfers/ambulation related to ADLs: modified indep with rollator Eating: increased difficulty with cutting food  Grooming: increased difficulty with clipping nails, otherwise manages fine with dominant/unaffected hand UB Dressing: increased time with clothing fasteners LB Dressing: increased time with clothing fasteners  Toileting: increased time to engage L hand into clothing management Bathing: pt sits on shower chair to wash LEs, some SOB with standing in shower Tub Shower transfers: Modified indep Equipment: Shower seat without back  IADLs: Shopping: motorized cart, can push cart for shorter shopping trips.  Light housekeeping: extra time and cautions to avoid falls, sometimes feels SOB with laundry  Meal Prep: friend helps with stove top cooking, pt can do light hot and cold meal prep  Community mobility: rollator, friend drove pt  Medication management: indep  Landscape architect: indep  Handwriting: NT; L non-dominant hand affected   MOBILITY STATUS: Hx of falls, rollator or funiture/wall walking in the home, rollator for community  POSTURE COMMENTS:  L sided hemiparesis   ACTIVITY TOLERANCE: Activity tolerance: Pt reports some dyspnea with prolonged standing/mobility  UPPER EXTREMITY ROM:  BUEs WFL  UPPER EXTREMITY MMT:     MMT Right eval Left eval Left 03/31/24 Left 05/13/24  Shoulder flexion 5 4-  4- 4+  Shoulder abduction 5 4- 4- 4+  Shoulder adduction      Shoulder extension      Shoulder internal rotation   4- 4+  Shoulder external rotation   3+ 4  Middle trapezius      Lower trapezius      Elbow flexion 5 4- 4+ 5  Elbow extension 5 4- 4+ 5  Wrist flexion 5 4- 4+ 5  Wrist extension 5 4- 4 4+  Wrist ulnar deviation      Wrist radial deviation      Wrist pronation 5 4- 4+ 4+  Wrist supination 5 4- 4+ 4+  (Blank rows = not tested)  HAND FUNCTION: Eval: Grip strength: Right: 111 lbs; Left: 51 lbs, Lateral pinch: Right: 23 lbs, Left: 13 lbs, and 3 point pinch: Right: 26 lbs, Left: 13 lbs 03/31/24: Grip strength: Right: 101 lbs, Left: 31 lbs, lateral pinch: Right: 23 lbs, Left: 13 lbs, 3 point pinch: Right: 25 lbs, Left: 14 lbs  04/06/24: Grip strength: Left: 32 lbs 04/13/24: Grip strength: Left: 45 lbs  05/04/24: Grip strength: Left: 24 lbs before stretching hand; 40 lbs after stretching; Lateral pinch: Left: 15 lbs, 3 point pinch: Left: 14 lbs  05/13/24: Grip strength: Left: 44 lbs; Lateral pinch: Left: 16 lbs, 3  point pinch: Left: 13 lbs  05/25/24: Grip strength: Left: 44 lbs, Lateral pinch: Left: 16 lbs, 3 point pinch: Left: 11 lbs (measured on S[place]han; previously measured on standard) 06/08/24: Grip strength: Left: 46 lbs, 44 lbs, 45 lbs (3 trials) 06/10/24: Grip strength: eft: 47#, 53#, 51# (3 trials)  COORDINATION: Finger Nose Finger test: increased time and decreased accuracy on the L  9 Hole Peg test: Right: 25 sec; Left: 1 min 27 sec 03/31/24: L 52 sec, 1 min 4 sec, 39 sec  05/13/24: 43 sec  05/25/24: 36 sec (after multiple trials)   05/13/24: Finger knee to nose 5x: R: 5 sec, L: 6 sec, mild dysmetria 05/25/24: L 6 sec, mild dysmetria   SENSATION: Pt reports increased tingling/numbness when hand balls up a little, but when he straightens it out it goes back to normal.   EDEMA: no visible edema   MUSCLE TONE: LUE: Mild   COGNITION: Overall cognitive status: Within  functional limits for tasks assessed pt reports difficulties with memory   VISION:             Subjective report: Wears glasses all the time.  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                                                                                                             TREATMENT DATE: 06/29/24 Therapeutic Activity: -Facilitated sustained grasping in L hand while ambulating to simulate carrying heavy items in L hand without dropping.    -1 trial of hand gripper set at 23.4# to move pegs 10 ft apart; vc for positioning hand gripper vertically to increase challenge while ambulating  -1 trial of hand gripper set at 17.9# to move pegs 10 ft apart; vc for positioning hand gripper vertically to increase challenge while ambulating  -1 trial of hand gripper set at 28.9# to remove pegs from pegboard into container on table top while seated.   Neuro re-ed: -Facilitated LUE FMC/dexterity skills working on environmental health practitioner with grooved pegs and pegboard: focus on rotating pegs within fingertips without stabilizing on table top and minimizing forearm rotation to further challenge dexterity in the fingers, storing multiple in hand, translatory movements moving pegs from palm to fingertips.   PATIENT EDUCATION: Education details:  LUE strengthening/coordination training   Person educated: Patient Education method: Verbal cues Education comprehension: verbalized understanding, returned demonstration, and verbal cues required  HOME EXERCISE  PROGRAM: Green theraputty 2-3x per day for 5-10 min periods for L hand grip strengthening, [facility] handout Tendon glides, passive stretching to the L wrist/digits, L hand AROM  GOALS: Goals reviewed with patient? Yes  SHORT TERM GOALS: Target date: 03/30/24  Pt  will be indep to perform HEP for increasing strength and coordination throughout the LUE. Baseline: Eval: Not yet initiated; 03/31/24: Indep with HEP and verbalizes understanding of progressions made today for frequency Goal status: achieved  LONG TERM GOALS: Target date: 08/05/24  Pt will increase LUE MMT by 1/2 grade or more to increase engagement of LUE into ADL/IADLs. Baseline: Eval: LUE grossly 4-/5 throughout (R 5/5); 03/31/24: L shoulder grossly 4-, elbow flex 4+, ext 4, forearm pron/sup 4+; 05/13/24: L shoulder 4 to 4+/5, elbow 5/5, wrist 4+ to 5/5 Goal status: achieved  2.  Pt will increase L grip strength by 20 lbs or more to enable pt to grasp and carry heavy ADL supplies in L hand. Baseline: Eval: L 51 lbs (R 111 lbs); 03/31/24: L grip 31 lbs (R 101 lbs); 05/13/24: L grip 44 lbs (edema in L hand has caused fluctuations in hand strength since eval); 05/25/24: L grip 44 lbs Goal status: in progress  3.  Pt will increase L hand [facility] skills as demonstrated by completion of 9 hole peg test in 30 sec or less to improve manipulation of small ADL supplies. Baseline: Eval: L 1 min 27 sec (R 25 sec); 03/31/24: 3 trials completed today: L 52 sec, 1 min 4 sec, 39 sec; 05/13/24: L 43 sec (R 25 sec); 05/25/24: L 36 sec (after several trials) Goal status: in progress  4.  Pt will increase LUE GMC to enable confidence with moving hot pots/pans on/off stove top and in/out of the oven using BUEs. Baseline: Eval: Pt reports that his friend currently assists with heavier meal prep; 03/31/24: Pt is now cooking independently, but with increased time/caution; 05/13/24: LUE GMC has greatly improved, though pt still demos mild dysmetria and must use caution  when lifting/carrying items noted above; 05/25/24: mild dysmetria/continues to use caution Goal status: in progress  5. Pt will increase tolerance for engaging the LUE into sustained pushing/grasping manuevers to enable push mowing yard in <45 min.  Baseline: Recert 05/13/24: Pt reports he prefers family to mow his lawn as it takes him increased time, estimating 45-60 min (brother mows in  30 min; 05/25/24: Limited mowing since L foot metatarsal fx  Goal status: in progress   6.  Pt will efficiently pick up flat items from table top using L hand.  Baseline: Recert 05/13/24: Pt requires extra time/repeat trials with attempts at picking up coins, flat stones, paper from table top when using  L hand; 05/25/24: extra time for above (see 05/13/24)  Goal status: in progress  ASSESSMENT: CLINICAL IMPRESSION: Pt reports no pain today.  Good tolerance to L grip strengthening with good ability to sustain grasp of pegs at above noted resistance settings while walking 10 feet with each peg on 2 trials.  Pt continues to be challenged with small item storage and translatory skills as noted by frequent dropping of grooved pegs when working with 2 or more at a time.  Pt is demonstrating improved ability to reposition pegs within fingertips with reduced substitution patterns.  Pt continues to benefit from skilled OT to work towards above noted goals for improving indep with daily tasks, return to work, to reduce burden of care on caregivers, and improve QOL.    PERFORMANCE DEFICITS: in functional skills including ADLs, IADLs, coordination, dexterity, sensation, tone, strength, Fine motor control, Gross motor control, mobility, balance, body mechanics, endurance, decreased knowledge of precautions, decreased knowledge of use of DME, vision, and UE functional use, cognitive skills including memory, and  psychosocial skills including coping strategies, environmental adaptation, and routines and behaviors.   IMPAIRMENTS: are  limiting patient from ADLs, IADLs, work, and leisure.   CO-MORBIDITIES: has co-morbidities such as tobacco use disorder, HTN, arthritis that affects occupational performance. Patient will benefit from skilled OT to address above impairments and improve overall function.  MODIFICATION OR ASSISTANCE TO COMPLETE EVALUATION: No modification of tasks or assist necessary to complete an evaluation.  OT OCCUPATIONAL PROFILE AND HISTORY: Detailed assessment: Review of records and additional review of physical, cognitive, psychosocial history related to current functional performance.  CLINICAL DECISION MAKING: Moderate - several treatment options, min-mod task modification necessary  REHAB POTENTIAL: Good  EVALUATION COMPLEXITY: Moderate  PLAN:  OT FREQUENCY: 2x/week  OT DURATION: 12 weeks  PLANNED INTERVENTIONS: 97168 OT Re-evaluation, 97535 self care/ADL training, 02889 therapeutic exercise, 97530 therapeutic activity, 97112 neuromuscular re-education, 97140 manual therapy, 97116 gait training, 02989 moist heat, 97010 cryotherapy, 97750 Physical Performance Testing, passive range of motion, balance training, functional mobility training, visual/perceptual remediation/compensation, psychosocial skills training, energy conservation, coping strategies training, patient/family education, and DME and/or AE instructions  RECOMMENDED OTHER SERVICES: SLP eval and treat d/t pt reporting memory deficits since CVA, drooling, and slurred speech  CONSULTED AND AGREED WITH PLAN OF CARE: Patient  PLAN FOR NEXT SESSION: see above  Inocente Blazing, MS, OTR/L   07/03/2024, 6:11 PM

## 2024-07-05 ENCOUNTER — Telehealth: Payer: Self-pay

## 2024-07-05 NOTE — Telephone Encounter (Signed)
 Patient stated he is not longer employed and his insurance has been dropped. He wants to know if you knew of any resources for his situation.

## 2024-07-06 ENCOUNTER — Ambulatory Visit

## 2024-07-06 ENCOUNTER — Encounter: Admitting: Speech Pathology

## 2024-07-06 ENCOUNTER — Ambulatory Visit: Admitting: Physical Therapy

## 2024-07-08 ENCOUNTER — Ambulatory Visit

## 2024-07-08 ENCOUNTER — Telehealth: Payer: Self-pay

## 2024-07-08 ENCOUNTER — Ambulatory Visit: Admitting: Physical Therapy

## 2024-07-08 ENCOUNTER — Encounter: Admitting: Speech Pathology

## 2024-07-08 NOTE — Progress Notes (Signed)
 Complex Care Management Note Care Guide Note  07/08/2024 Name: Mark Mcintosh MRN: [id number] DOB: May 02, 1971   Complex Care Management Outreach Attempts: An unsuccessful telephone outreach was attempted today to offer the patient information about available complex care management services.  Follow Up Plan:  Additional outreach attempts will be made to offer the patient complex care management information and services.   Encounter Outcome:  No Answer-Left voicemail  Leotis Rase [facility], [facility] Guide  Direct Dial: [phone]  Fax [phone]

## 2024-07-08 NOTE — Progress Notes (Signed)
 Care Guide Pharmacy Note  07/08/2024 Name: Mark Mcintosh MRN: [id number] DOB: [birth year]  Referred By: Wellington Curtis LABOR, FNP Reason for referral: Call Attempt #1 (Unsuccessful initial outreach to schedule with RNCM-[place] and Pharm D Allyson.) and Complex Care Management   Mark Mcintosh is a 53 y.o. year old male who is a primary care patient of Wellington Curtis LABOR, FNP.  Mark Mcintosh was referred to the pharmacist for assistance related to: HTN  An unsuccessful telephone outreach was attempted today to contact the patient who was referred to the pharmacy team for assistance with Disease management. Additional attempts will be made to contact the patient.  Mark Mcintosh [facility], [facility] Guide  Direct Dial: [phone]  Fax [phone]

## 2024-07-09 ENCOUNTER — Ambulatory Visit (INDEPENDENT_AMBULATORY_CARE_PROVIDER_SITE_OTHER): Admitting: Family Medicine

## 2024-07-09 ENCOUNTER — Encounter: Payer: Self-pay | Admitting: Family Medicine

## 2024-07-09 VITALS — BP 111/94 | HR 106 | Ht 71.0 in | Wt 231.7 lb

## 2024-07-09 DIAGNOSIS — R7303 Prediabetes: Secondary | ICD-10-CM

## 2024-07-09 DIAGNOSIS — N529 Male erectile dysfunction, unspecified: Secondary | ICD-10-CM

## 2024-07-09 DIAGNOSIS — I6930 Unspecified sequelae of cerebral infarction: Secondary | ICD-10-CM | POA: Diagnosis not present

## 2024-07-09 DIAGNOSIS — I10 Essential (primary) hypertension: Secondary | ICD-10-CM | POA: Diagnosis not present

## 2024-07-09 DIAGNOSIS — R269 Unspecified abnormalities of gait and mobility: Secondary | ICD-10-CM

## 2024-07-09 DIAGNOSIS — Z1159 Encounter for screening for other viral diseases: Secondary | ICD-10-CM

## 2024-07-09 DIAGNOSIS — E782 Mixed hyperlipidemia: Secondary | ICD-10-CM

## 2024-07-09 NOTE — Progress Notes (Signed)
 Complex Care Management Note Care Guide Note  07/09/2024 Name: Mark Mcintosh MRN: [id number] DOB: 1971/04/10   Complex Care Management Outreach Attempts: A second unsuccessful outreach was attempted today to offer the patient with information about available complex care management services.  Follow Up Plan:  Additional outreach attempts will be made to offer the patient complex care management information and services.   Encounter Outcome:  Patient Request to Call Back  Leotis Rase [facility], [facility] Guide  Direct Dial: [phone]  Fax [phone]

## 2024-07-09 NOTE — Progress Notes (Signed)
 Established Patient Office Visit  Introduced to nurse practitioner role and practice setting.  All questions answered.  Discussed provider/patient relationship and expectations.   Subjective   Patient ID: Mark Mcintosh, male    DOB: 09/16/1970  Age: 53 y.o. MRN: [id number]  Chief Complaint  Patient presents with   Medical Management of Chronic Issues   Hypertension    Discussed the use of AI scribe software for clinical note transcription with the patient, who gave verbal consent to proceed.  History of Present Illness Mark Mcintosh is a 53 year old male who presents for follow-up on his stroke rehabilitation and medication management for chronic disease.  He is currently undergoing rehabilitation and has been making progress, no longer needing a cane for ambulation. He attends physical therapy and occupational therapy sessions twice a week on Tuesdays and Thursdays. His return to work, initially scheduled for November 23rd, has been extended by three months to allow more time for recovery. He plans to return to work in three months and has a follow-up appointment scheduled for January 23rd for further evaluation with PMR, Dr. Keitha  He is taking Jardiance for prediabetes - which was changed by Dr. Florencio, which he finds more tolerable than his previous medication, metformin , as it does not cause gastrointestinal issues or polyuria. He also takes Cialis daily for erectile dysfunction, which he attributes to side effects from his previous prediabetes medication. Prescribed by Dr. Florencio.      06/29/2024    9:56 AM 06/11/2024   11:36 AM 03/26/2024   11:11 AM  Depression screen PHQ 2/9  Decreased Interest 0 0 0  Down, Depressed, Hopeless 0 0 0  PHQ - 2 Score 0 0 0  Altered sleeping   1  Tired, decreased energy   0  Change in appetite   0  Feeling bad or failure about yourself    0  Trouble concentrating   1  Moving slowly or fidgety/restless   0  Suicidal  thoughts   0  PHQ-9 Score   2   Difficult doing work/chores   Somewhat difficult     Data saved with a previous flowsheet row definition       02/27/2024    9:11 AM  GAD 7 : Generalized Anxiety Score  Nervous, Anxious, on Edge 1  Control/stop worrying 1  Worry too much - different things 2  Trouble relaxing 1  Restless 0  Easily annoyed or irritable 1  Afraid - awful might happen 2  Total GAD 7 Score 8  Anxiety Difficulty Somewhat difficult     ROS  Negative unless indicated in HPI   Objective:     BP (!) 111/94 (BP Location: Left Arm, Patient Position: Sitting, Cuff Size: Large) Comment: has not taking medication yet  Pulse (!) 106   Ht 5' 11 (1.803 m)   Wt 231 lb 11.2 oz (105.1 kg)   SpO2 99%   BMI 32.32 kg/m    Physical Exam Constitutional:      General: He is not in acute distress.    Appearance: Normal appearance. He is not ill-appearing, toxic-appearing or diaphoretic.  HENT:     Head: Normocephalic.     Nose: Nose normal.     Mouth/Throat:     Mouth: Mucous membranes are moist.  Eyes:     Extraocular Movements: Extraocular movements intact.     Conjunctiva/sclera: Conjunctivae normal.     Pupils: Pupils are equal, round, and reactive to light.  Cardiovascular:     Rate and Rhythm: Normal rate and regular rhythm.     Heart sounds: No murmur heard.    No friction rub. No gallop.  Pulmonary:     Effort: Pulmonary effort is normal. No respiratory distress.     Breath sounds: Normal breath sounds. No stridor. No wheezing, rhonchi or rales.  Chest:     Chest wall: No tenderness.  Musculoskeletal:     Right lower leg: No edema.     Left lower leg: No edema.     Comments: Baseline weakness on L side due to CVA hx, improving  Skin:    General: Skin is warm and dry.     Capillary Refill: Capillary refill takes less than 2 seconds.  Neurological:     General: No focal deficit present.     Mental Status: He is alert and oriented to person, place, and  time. Mental status is at baseline.     Cranial Nerves: No cranial nerve deficit.     Sensory: No sensory deficit.     Motor: No weakness.     Coordination: Coordination normal.     Gait: Gait normal.      No results found for any visits on 07/09/24.    The ASCVD Risk score (Arnett DK, et al., 2019) failed to calculate for the following reasons:   Risk score cannot be calculated because patient has a medical history suggesting prior/existing ASCVD    Assessment & Plan:  Primary hypertension -     Basic metabolic panel with GFR  Prediabetes -     Lipid Panel With LDL/HDL Ratio -     Hemoglobin A1c  History of cerebrovascular accident (CVA) with residual deficit  Erectile disorder  Gait abnormality  Screening for viral disease -     Hepatitis C antibody  Mixed hyperlipidemia     Assessment and Plan Assessment & Plan History of CVA/ Gait instability - daily asprin, BP control, on statin - gait instability improving - Functional impairment is improving with ongoing rehabilitation.  - He is attending physical therapy and occupational therapy twice a week and reports significant improvement in strength and mobility.  - He is not using a cane or wheelchair and is progressing well. Able to ambulate without assistive device today - Continue physical therapy and occupational therapy twice a week - Follow up with Dr. Carilyn on January 23rd for further evaluation and work return evaluation through [facility] [place] - Neurology follow up February 2026 - Cardiology follow up April 2026  Hypertension - Chronic, typically controlled, DBP elevated today - pt endorses did not take medication this AM, usually takes with breakfast - continue amlodipine  2.5mg  daily - Continue irbesartan  75mg  daily Blood pressure is well-controlled with current medication regimen. He did not take his medication this morning, which may have contributed to a slightly elevated diastolic  reading.  Prediabetes Managed with Jardiance, which is better tolerated than previous medication (metformin ).  He reports fewer gastrointestinal side effects and improved sleep quality with Jardiance. - Continue Jardiance 10mg  daily for prediabetes management - repeast A1c one or after 07/19/24; previous 5.9%  HLD - previous stroke - continue atorvastatin  40mg  daily - repeat lipid panel  Erectile dysfunction - Continue daily Cialis - prescribed by Dr. Florencio  General maintenance - has colonoscopy scheduled on 08/06/24  Return in about 3 months (around 10/09/2024).   I, Curtis DELENA Boom, FNP, have reviewed all documentation for this visit. The documentation on 07/09/24 for the exam,  diagnosis, procedures, and orders are all accurate and complete.   Curtis DELENA Boom, FNP

## 2024-07-12 ENCOUNTER — Ambulatory Visit: Admitting: Family Medicine

## 2024-07-12 ENCOUNTER — Telehealth: Payer: Self-pay

## 2024-07-12 NOTE — Telephone Encounter (Signed)
 Copied from CRM #[id number]. Topic: Appointments - Scheduling Inquiry for Clinic >> Jul 12, 2024  8:11 AM Delon HERO wrote: Reason for CRM: Patient is calling to see if he left his wallet on Friday July 09, 2024

## 2024-07-13 ENCOUNTER — Ambulatory Visit

## 2024-07-13 ENCOUNTER — Encounter: Admitting: Speech Pathology

## 2024-07-20 ENCOUNTER — Ambulatory Visit: Payer: Self-pay

## 2024-07-20 ENCOUNTER — Encounter: Admitting: Speech Pathology

## 2024-07-22 ENCOUNTER — Encounter: Admitting: Speech Pathology

## 2024-07-22 ENCOUNTER — Ambulatory Visit: Payer: Self-pay

## 2024-07-27 ENCOUNTER — Ambulatory Visit: Payer: Self-pay

## 2024-07-27 ENCOUNTER — Encounter: Admitting: Speech Pathology

## 2024-07-28 ENCOUNTER — Ambulatory Visit

## 2024-07-28 ENCOUNTER — Other Ambulatory Visit: Payer: Self-pay

## 2024-07-28 NOTE — Patient Outreach (Signed)
 Social Drivers of Health  Community Resource and Care Coordination Visit Note   07/28/2024  Name: Lexington Krotz MRN: [id number] DOB:10-28-1970  Situation: Referral received for [facility] needs assessment and assistance related to Housing  Financial Strain  Food Insecurity  Utitiliies. I obtained verbal consent from Patient.  Visit completed with Patient on the phone.   Background:   SDOH Interventions Today    Flowsheet Row Most Recent Value  SDOH Interventions   Food Insecurity Interventions Community Resources Provided  Housing Interventions Community Resources Provided  Utilities Interventions Community Resources Provided  Financial Strain Interventions Community Resources Provided     Assessment:   Goals Addressed             This Visit's Progress    BSW Goals       Current SDOH Barriers:  Financial constraints related to No income Limited access to food Housing barriers  Interventions: Referred patient to community resources for food, rent and utilities.  SW sent resources via mail and email on file.  Thersia Hoar, HEDWIG, MHA [place]  [facility] Social Worker, Population Health [phone]           Recommendation:   Use resources SW sent via mail and email for assistance with rent and utilities.  Follow Up Plan:   Telephone follow-up 08/09/24 at 1pm  Thersia Hoar, BSW, [facility]  [facility] Based [facility] Social Worker, Population Health [phone]

## 2024-07-28 NOTE — Patient Instructions (Signed)
 Visit Information  [name] was given information about Medicaid Managed Care team care coordination services as a part of their [place] [employer] benefit.   If you would like to schedule transportation through your [place] plan, please call the following number at least 2 days in advance of your appointment: [phone].   There is no limit to the number of trips during the year between medical appointments, healthcare facilities, or pharmacies. Transportation must be scheduled at least 2 business days before but not more than thirty 30 days before of your appointment.  Call the Behavioral Health Crisis Line at [phone], at any time, 24 hours a day, 7 days a week. If you are in danger or need immediate medical attention call 911.    Social Worker will follow up on 08/09/24 at 1pm.   Mark Mcintosh, BSW, MHA [place]  [facility] Social Worker, Population Health [phone]   Following is a copy of your plan of care:   Goals Addressed             This Visit's Progress    BSW Goals       Current SDOH Barriers:  Financial constraints related to No income Limited access to food Housing barriers  Interventions: Referred patient to community resources for food, rent and utilities.  SW sent resources via mail and email on file.  Mark Mcintosh, Mark Mcintosh, MHA [place]  [facility] Social Worker, Population Health [phone]

## 2024-07-29 ENCOUNTER — Ambulatory Visit: Payer: Self-pay

## 2024-07-29 ENCOUNTER — Encounter: Admitting: Speech Pathology

## 2024-08-02 ENCOUNTER — Other Ambulatory Visit: Payer: Self-pay

## 2024-08-02 DIAGNOSIS — Z59868 Other specified financial insecurity: Secondary | ICD-10-CM

## 2024-08-02 DIAGNOSIS — I10 Essential (primary) hypertension: Secondary | ICD-10-CM

## 2024-08-02 NOTE — Patient Instructions (Signed)
 Visit Information  [name] was given information about Medicaid Managed Care team care coordination services as a part of their [place] benefit: [email or web address] - call customer service number on back of card for benefit eligibility and dental providers.   If you would like to schedule transportation through your [place] Medicaid plan, please call the following number at least 2 days in advance of your appointment: [phone].   There is no limit to the number of trips during the year between medical appointments, healthcare facilities, or pharmacies. Transportation must be scheduled at least 2 business days before but not more than thirty 30 days before of your appointment.  Call the Behavioral Health Crisis Line at [phone], at any time, 24 hours a day, 7 days a week. If you are in danger or need immediate medical attention call 911.  Thank you for taking time to visit with me today. Please don't hesitate to contact me if I can be of assistance to you before our next scheduled appointment.  Our next appointment is by telephone on 08/20/2024 at 3:00 pm Please call the care guide team at [phone] if you need to cancel or reschedule your appointment.   SMOKING CESSATION RESOURCE: 1-800-QUIT-NOW  Following is a copy of your care plan:   Goals Addressed             This Visit's Progress    TBD       Problems:  Chronic Disease Management support and education needs related to HTN and Tobacco Use  Goal: Over the next 90 days the Patient will attend all scheduled medical appointments: Patient will attend appointments as evidenced by chart review        continue to work with RN Care Manager and/or Social Worker to address care management and care coordination needs related to HTN and Tobacco Use as evidenced by adherence to care management team scheduled appointments     take all medications exactly as prescribed and will call provider  for medication related questions as evidenced by patient report    verbalize basic understanding of HTN and Tobacco Use disease process and self health management plan as evidenced by YUW:ujxpwh medications as prescribed, exercising as tolerated, continue to follow heart healthy/low sodium/low carb diet, once BP cuff received - monitor BP and promptly report concerns to provider, call 911 for sx of stroke,  meet with pharmacist regarding Jardiance cost, contact [place] [employer] for available benefits including dental. Tobacco Use: use nicorette gum, discuss assist with patch with pharmacist, utilize resources provided to decrease smoking and then quit and maintain smoking cessation.   Interventions:   Hypertension Interventions: Last practice recorded BP readings:  BP Readings from Last 3 Encounters:  07/09/24 (!) 111/94  06/11/24 115/82  04/21/24 131/68   Most recent eGFR/CrCl: No results found for: EGFR  No components found for: CRCL  Evaluation of current treatment plan related to hypertension self management and patient's adherence to plan as established by provider Provided education to patient re: stroke prevention, s/s of heart attack and stroke Reviewed medications with patient and discussed importance of compliance Provided assistance with obtaining home blood pressure monitor via requested order from PCP and patient will call [place] [facility]; Counseled on the importance of exercise goals with target of 150 minutes per week Discussed plans with patient for ongoing care management follow up and provided patient with direct contact information for care management team Advised patient, providing education and rationale, to monitor blood pressure daily and record,  calling PCP for findings outside established parameters Provided education on prescribed diet Heart Healthy/low Salt/low Carb Discussed complications of poorly controlled blood pressure such as heart disease, stroke,  circulatory complications, vision complications, kidney impairment, sexual dysfunction Screening for signs and symptoms of depression related to chronic disease state  Assessed social determinant of health barriers  Smoking Cessation Interventions: Reviewed smoking history:  tobacco abuse of TBD years; currently smoking 1/4  ppd Previous quit attempts, unsuccessful TBD successful using TBD  Reports smoking within 30 minutes of waking up Reports triggers to smoke include: TBD Reports motivation to quit smoking includes: Recent Stroke On a scale of 1-10, reports MOTIVATION to quit is TBD On a scale of 1-10, reports CONFIDENCE in quitting is TBD  Advised patient to discuss smoking cessation options with provider Provided patient with printed smoking cessation educational materials Referred patient to pharmacy team Provided contact information for [place] Quit Line (1-800-QUIT-NOW) Discussed plans with patient for ongoing care management follow up and provided patient with direct contact information for care management team  Patient Self-Care Activities:  Attend all scheduled provider appointments Attend church or other social activities Call pharmacy for medication refills 3-7 days in advance of running out of medications Call provider office for new concerns or questions  Take medications as prescribed   Work with the social worker to address care coordination needs and will continue to work with the clinical team to address health care and disease management related needs Work with the pharmacist to address medication management needs and will continue to work with the clinical team to address health care and disease management related needs check blood pressure daily learn about high blood pressure keep a blood pressure log take blood pressure log to all doctor appointments call doctor for signs and symptoms of high blood pressure keep all doctor appointments take medications for blood  pressure exactly as prescribed begin an exercise program report new symptoms to your doctor eat more whole grains, fruits and vegetables, lean meats and healthy fats limit salt intake   Plan:  Telephone follow up appointment with care management team member scheduled for:  08/20/2024 at 3:00 pm             Please call the Suicide and Crisis Lifeline: 988 call the [place]  National Suicide Prevention Lifeline: 440-491-8815 or TTY: (931)493-8535 TTY 629 795 7814) to talk to a trained counselor call 1-800-273-TALK (toll free, 24 hour hotline) go to [facility] [address] [place] [phone]) call 911 if you are experiencing a Mental Health or Behavioral Health Crisis or need someone to talk to.  Patient verbalized understanding of Care plan and visit instructions communicated this visit: Sent via MyChart as discussed.  [name], MSN, RN [place]  [facility], [facility] RN Care Manager Direct Dial: [phone] Fax: [phone]  Warning Signs of a Stroke: What to Know A stroke happens when there's less blood flow to part of your brain. This keeps your brain from getting enough oxygen . It can lead to a brain injury. A stroke is an emergency and should be treated right away. You're more likely to get better after a stroke if you get help right away. It's very important to know the symptoms of a stroke. What types of strokes are there? There are 2 main types of stroke: Ischemic stroke. This is the most common type. It happens when a blood vessel that sends blood to the brain is blocked. Hemorrhagic stroke. This happens when there's  bleeding in your brain. This may be from a blood vessel leaking or bursting. A transient ischemic attack (TIA) causes the same symptoms as a stroke. But the symptoms go away quickly and don't cause lasting damage to your brain. TIAs need to be treated right away. Having a TIA is a  sign that you are more at risk for having a stroke in the future. What are the warning signs of a stroke? The symptoms of a stroke may differ based on where it is in your brain. Symptoms often happen all of a sudden. BE FAST symptoms BE FAST is an easy way to remember the main warning signs: B - Balance. Feeling dizzy, sudden trouble walking, or loss of balance. E - Eyes. Trouble seeing or a change in how you see. F - Face. Sudden weakness or feeling numb in the face. The face or eyelid may droop on one side. A - Arms. Weakness or loss of feeling in an arm. This happens fast and often only on one side. ILENE - Speech. Sudden trouble speaking, slurred speech, or trouble understanding what people say. T - Time. Time to call 911. Write down what time symptoms started. You may be having a stroke even if you only have one BE FAST symptom. Other signs of a stroke Other signs of a stroke may be: A sudden, very bad headache with no known cause. Feeling like you may throw up. Throwing up. These symptoms may be an emergency. Call 911 right away. Do not wait to see if the symptoms will go away. Do not drive yourself to the hospital.  This information is not intended to replace advice given to you by your health care provider. Make sure you discuss any questions you have with your health care provider. Document Revised: 11/27/2023 Document Reviewed: 11/27/2023 Elsevier Patient Education  2025 [employer].   Managing Your Hypertension Hypertension, also called high blood pressure, is when the force of the blood pressing against the walls of the arteries is too strong. Arteries are blood vessels that carry blood from your heart throughout your body. Hypertension forces the heart to work harder to pump blood and may cause the arteries to become narrow or stiff. Understanding blood pressure readings A blood pressure reading includes a higher number over a lower number: The first, or top, number is  called the systolic pressure. It is a measure of the pressure in your arteries as your heart beats. The second, or bottom number, is called the diastolic pressure. It is a measure of the pressure in your arteries as the heart relaxes. For most people, a normal blood pressure is below 120/80. Your personal target blood pressure may vary depending on your medical conditions, your age, and other factors. Blood pressure is classified into four stages. Based on your blood pressure reading, your health care provider may use the following stages to determine what type of treatment you need, if any. Systolic pressure and diastolic pressure are measured in a unit called millimeters of mercury (mmHg). Normal Systolic pressure: below 120. Diastolic pressure: below 80. Elevated Systolic pressure: 120-129. Diastolic pressure: below 80. Hypertension stage 1 Systolic pressure: 130-139. Diastolic pressure: 80-89. Hypertension stage 2 Systolic pressure: 140 or above. Diastolic pressure: 90 or above. How can this condition affect me? Managing your hypertension is very important. Over time, hypertension can damage the arteries and decrease blood flow to parts of the body, including the brain, heart, and kidneys. Having untreated or uncontrolled hypertension can lead to: A  heart attack. A stroke. A weakened blood vessel (aneurysm). Heart failure. Kidney damage. Eye damage. Memory and concentration problems. Vascular dementia. What actions can I take to manage this condition? Hypertension can be managed by making lifestyle changes and possibly by taking medicines. Your health care provider will help you make a plan to bring your blood pressure within a normal range. You may be referred for counseling on a healthy diet and physical activity. Nutrition  Eat a diet that is high in fiber and potassium, and low in salt (sodium), added sugar, and fat. An example eating plan is called the DASH diet. DASH stands  for Dietary Approaches to Stop Hypertension. To eat this way: Eat plenty of fresh fruits and vegetables. Try to fill one-half of your plate at each meal with fruits and vegetables. Eat whole grains, such as whole-wheat pasta, brown rice, or whole-grain bread. Fill about one-fourth of your plate with whole grains. Eat low-fat dairy products. Avoid fatty cuts of meat, processed or cured meats, and poultry with skin. Fill about one-fourth of your plate with lean proteins such as fish, chicken without skin, beans, eggs, and tofu. Avoid pre-made and processed foods. These tend to be higher in sodium, added sugar, and fat. Reduce your daily sodium intake. Many people with hypertension should eat less than 1,500 mg of sodium a day. Lifestyle  Work with your health care provider to maintain a healthy body weight or to lose weight. Ask what an ideal weight is for you. Get at least 30 minutes of exercise that causes your heart to beat faster (aerobic exercise) most days of the week. Activities may include walking, swimming, or biking. Include exercise to strengthen your muscles (resistance exercise), such as weight lifting, as part of your weekly exercise routine. Try to do these types of exercises for 30 minutes at least 3 days a week. Do not use any products that contain nicotine  or tobacco. These products include cigarettes, chewing tobacco, and vaping devices, such as e-cigarettes. If you need help quitting, ask your health care provider. Control any long-term (chronic) conditions you have, such as high cholesterol or diabetes. Identify your sources of stress and find ways to manage stress. This may include meditation, deep breathing, or making time for fun activities. Alcohol use Do not drink alcohol if: Your health care provider tells you not to drink. You are pregnant, may be pregnant, or are planning to become pregnant. If you drink alcohol: Limit how much you have to: 0-1 drink a day for  women. 0-2 drinks a day for men. Know how much alcohol is in your drink. In the [place] one drink equals one 12 oz bottle of beer (355 mL), one 5 oz glass of wine (148 mL), or one 1 oz glass of hard liquor (44 mL). Medicines Your health care provider may prescribe medicine if lifestyle changes are not enough to get your blood pressure under control and if: Your systolic blood pressure is 130 or higher. Your diastolic blood pressure is 80 or higher. Take medicines only as told by your health care provider. Follow the directions carefully. Blood pressure medicines must be taken as told by your health care provider. The medicine does not work as well when you skip doses. Skipping doses also puts you at risk for problems. Monitoring Before you monitor your blood pressure: Do not smoke, drink caffeinated beverages, or exercise within 30 minutes before taking a measurement. Use the bathroom and empty your bladder (urinate). Sit quietly for at least  5 minutes before taking measurements. Monitor your blood pressure at home as told by your health care provider. To do this: Sit with your back straight and supported. Place your feet flat on the floor. Do not cross your legs. Support your arm on a flat surface, such as a table. Make sure your upper arm is at heart level. Each time you measure, take two or three readings one minute apart and record the results. You may also need to have your blood pressure checked regularly by your health care provider. General information Talk with your health care provider about your diet, exercise habits, and other lifestyle factors that may be contributing to hypertension. Review all the medicines you take with your health care provider because there may be side effects or interactions. Keep all follow-up visits. Your health care provider can help you create and adjust your plan for managing your high blood pressure. Where to find more information National Heart, Lung,  and Blood Institute: popsteam.is American Heart Association: www.heart.org Contact a health care provider if: You think you are having a reaction to medicines you have taken. You have repeated (recurrent) headaches. You feel dizzy. You have swelling in your ankles. You have trouble with your vision. Get help right away if: You develop a severe headache or confusion. You have unusual weakness or numbness, or you feel faint. You have severe pain in your chest or abdomen. You vomit repeatedly. You have trouble breathing. These symptoms may be an emergency. Get help right away. Call 911. Do not wait to see if the symptoms will go away. Do not drive yourself to the hospital. Summary Hypertension is when the force of blood pumping through your arteries is too strong. If this condition is not controlled, it may put you at risk for serious complications. Your personal target blood pressure may vary depending on your medical conditions, your age, and other factors. For most people, a normal blood pressure is less than 120/80. Hypertension is managed by lifestyle changes, medicines, or both. Lifestyle changes to help manage hypertension include losing weight, eating a healthy, low-sodium diet, exercising more, stopping smoking, and limiting alcohol. This information is not intended to replace advice given to you by your health care provider. Make sure you discuss any questions you have with your health care provider. Document Revised: 04/19/2021 Document Reviewed: 04/19/2021 Elsevier Patient Education  2024 Else[employer] Inc.   Managing the Challenge of Quitting Smoking - 1-800 QUIT-NOW Helpline Quitting smoking is a physical and mental challenge. You may have cravings, withdrawal symptoms, and temptation to smoke. Before quitting, work with your health care provider to make a plan that can help you manage quitting. Making a plan before you quit may keep you from smoking when you have the urge to  smoke while trying to quit. How to manage lifestyle changes Managing stress Stress can make you want to smoke, and wanting to smoke may cause stress. It is important to find ways to manage your stress. You could try some of the following: Practice relaxation techniques. Breathe slowly and deeply, in through your nose and out through your mouth. Listen to music. Soak in a bath or take a shower. Imagine a peaceful place or vacation. Get some support. Talk with family or friends about your stress. Join a support group. Talk with a counselor or therapist. Get some physical activity. Go for a walk, run, or bike ride. Play a favorite sport. Practice yoga.  Medicines Talk with your health care provider about medicines  that might help you deal with cravings and make quitting easier for you. Relationships Social situations can be difficult when you are quitting smoking. To manage this, you can: Avoid parties and other social situations where people might be smoking. Avoid alcohol. Leave right away if you have the urge to smoke. Explain to your family and friends that you are quitting smoking. Ask for support and let them know you might be a bit grumpy. Plan activities where smoking is not an option. General instructions Be aware that many people gain weight after they quit smoking. However, not everyone does. To keep from gaining weight, have a plan in place before you quit, and stick to the plan after you quit. Your plan should include: Eating healthy snacks. When you have a craving, it may help to: Eat popcorn, or try carrots, celery, or other cut vegetables. Chew sugar-free gum. Changing how you eat. Eat small portion sizes at meals. Eat 4-6 small meals throughout the day instead of 1-2 large meals a day. Be mindful when you eat. You should avoid watching television or doing other things that might distract you as you eat. Exercising regularly. Make time to exercise each day. If you do  not have time for a long workout, do short bouts of exercise for 5-10 minutes several times a day. Do some form of strengthening exercise, such as weight lifting. Do some exercise that gets your heart beating and causes you to breathe deeply, such as walking fast, running, swimming, or biking. This is very important. Drinking plenty of water or other low-calorie or no-calorie drinks. Drink enough fluid to keep your urine pale yellow.  How to recognize withdrawal symptoms Your body and mind may experience discomfort as you try to get used to not having nicotine  in your system. These effects are called withdrawal symptoms. They may include: Feeling hungrier than normal. Having trouble concentrating. Feeling irritable or restless. Having trouble sleeping. Feeling depressed. Craving a cigarette. These symptoms may surprise you, but they are normal to have when quitting smoking. To manage withdrawal symptoms: Avoid places, people, and activities that trigger your cravings. Remember why you want to quit. Get plenty of sleep. Avoid coffee and other drinks that contain caffeine. These may worsen some of your symptoms. How to manage cravings Come up with a plan for how to deal with your cravings. The plan should include the following: A definition of the specific situation you want to deal with. An activity or action you will take to replace smoking. A clear idea for how this action will help. The name of someone who could help you with this. Cravings usually last for 5-10 minutes. Consider taking the following actions to help you with your plan to deal with cravings: Keep your mouth busy. Chew sugar-free gum. Suck on hard candies or a straw. Brush your teeth. Keep your hands and body busy. Change to a different activity right away. Squeeze or play with a ball. Do an activity or a hobby, such as making bead jewelry, practicing needlepoint, or working with wood. Mix up your normal  routine. Take a short exercise break. Go for a quick walk, or run up and down stairs. Focus on doing something kind or helpful for someone else. Call a friend or family member to talk during a craving. Join a support group. Contact a quitline. Where to find support To get help or find a support group: Call the National Cancer Institute's Smoking Quitline: 1-800-QUIT-NOW 718-595-3851) Text QUIT to SmokefreeTXT: 521151  Where to find more information Visit these websites to find more information on quitting smoking: [place]. Department of Health and Human Services: www.smokefree.gov American Lung Association: www.freedomfromsmoking.org Centers for Disease Control and Prevention (CDC): footballexhibition.com.br American Heart Association: www.heart.org Contact a health care provider if: You want to change your plan for quitting. The medicines you are taking are not helping. Your eating feels out of control or you cannot sleep. You feel depressed or become very anxious. Summary Quitting smoking is a physical and mental challenge. You will face cravings, withdrawal symptoms, and temptation to smoke again. Preparation can help you as you go through these challenges. Try different techniques to manage stress, handle social situations, and prevent weight gain. You can deal with cravings by keeping your mouth busy (such as by chewing gum), keeping your hands and body busy, calling family or friends, or contacting a quitline for people who want to quit smoking. You can deal with withdrawal symptoms by avoiding places where people smoke, getting plenty of rest, and avoiding drinks that contain caffeine. This information is not intended to replace advice given to you by your health care provider. Make sure you discuss any questions you have with your health care provider. Document Revised: 07/27/2021 Document Reviewed: 07/27/2021 Elsevier Patient Education  2024 [employer].   Advance Directive  Advance directives  are legal papers that state your wishes about health care decisions. They let your wishes be known to family, friends, and health care providers if you become unable to speak for yourself.  You should write these papers out over time rather than all at once. They can be changed and updated at any time. The types of advance directives include: Medical power of attorney (POA). Living wills. Do not resuscitate (DNR) or do not attempt resuscitation (DNAR) orders. What are a health care proxy and medical POA? A health care proxy is also called a health care agent. It's a person you choose to make medical decisions for you when you can't make them for yourself. In most cases, a proxy is a trusted friend or family member. A medical POA is legal paperwork that names your proxy. It may need to be: Signed. Notarized. Dated. Copied. Witnessed. Added to your medical record. You may also want to choose someone to handle your money if you can't do so. This is called a durable POA for finances. It's separate from a medical POA. You may choose your health care proxy or someone else to act as your agent in money matters. If you don't have a proxy, or if the proxy may not be acting in your best interest, a court may choose a guardian to act on your behalf. What is a living will? A living will is legal paperwork that states your wishes about medical care. Providers should keep a copy of it in your medical record. You may want to give a copy to family members or friends. You can also keep a card in your wallet to let loved ones know you have a living will and where they can find it. A living will may be used if: You're very sick with something that will end your life. You become disabled. You can't make decisions or speak for yourself. Your living will should include whether: To use or not use life support equipment. This may include machines to filter your blood or to help you breathe. You want a DNR or DNAR  order. This tells providers not to use CPR if your heart or  breathing stops. To use or not use tube feeding. You want to be given foods and fluids. You want a type of comfort care called palliative care. This may be given when the goal for treatment becomes comfort rather than a cure. You want to donate your organs and tissues. A living will doesn't say what to do with your money and property if you pass away. What is a DNR or DNAR? A DNR or DNAR order is a request not to have CPR. If you don't have one of these orders, a provider will try to help you if your heart stops or you stop breathing.  If you plan to have surgery, talk with your provider about your DNR or DNAR order. What happens if I don't have an advance directive? Each state has its own laws about advance directives. Some states assign family decision makers to act on your behalf if you don't have an advance directive.  Check with your provider, attorney, or state representative about the laws in your state. Where to find more information Each state has its own laws about advance directives. You can look up these laws at: https://rodriguez-phillips.com/ This information is not intended to replace advice given to you by your health care provider. Make sure you discuss any questions you have with your health care provider. Document Revised: 12/23/2022 Document Reviewed: 12/23/2022 Elsevier Patient Education  2024 [employer].  Fall Prevention in the Home, Adult Falls can cause injuries and can happen to people of all ages. There are many things you can do to make your home safer and to help prevent falls. What actions can I take to prevent falls? General information Use good lighting in all rooms. Make sure to: Replace any light bulbs that burn out. Turn on the lights in dark areas and use night-lights. Keep items that you use often in easy-to-reach places. Lower the shelves around your home if needed. Move furniture so that there are clear paths  around it. Do not use throw rugs or other things on the floor that can make you trip. If any of your floors are uneven, fix them. Add color or contrast paint or tape to clearly mark and help you see: Grab bars or handrails. First and last steps of staircases. Where the edge of each step is. If you use a ladder or stepladder: Make sure that it is fully opened. Do not climb a closed ladder. Make sure the sides of the ladder are locked in place. Have someone hold the ladder while you use it. Know where your pets are as you move through your home. What can I do in the bathroom?     Keep the floor dry. Clean up any water on the floor right away. Remove soap buildup in the bathtub or shower. Buildup makes bathtubs and showers slippery. Use non-skid mats or decals on the floor of the bathtub or shower. Attach bath mats securely with double-sided, non-slip rug tape. If you need to sit down in the shower, use a non-slip stool. Install grab bars by the toilet and in the bathtub and shower. Do not use towel bars as grab bars. What can I do in the bedroom? Make sure that you have a light by your bed that is easy to reach. Do not use any sheets or blankets on your bed that hang to the floor. Have a firm chair or bench with side arms that you can use for support when you get dressed. What can I do  in the kitchen? Clean up any spills right away. If you need to reach something above you, use a step stool with a grab bar. Keep electrical cords out of the way. Do not use floor polish or wax that makes floors slippery. What can I do with my stairs? Do not leave anything on the stairs. Make sure that you have a light switch at the top and the bottom of the stairs. Make sure that there are handrails on both sides of the stairs. Fix handrails that are broken or loose. Install non-slip stair treads on all your stairs if they do not have carpet. Avoid having throw rugs at the top or bottom of the  stairs. Choose a carpet that does not hide the edge of the steps on the stairs. Make sure that the carpet is firmly attached to the stairs. Fix carpet that is loose or worn. What can I do on the outside of my home? Use bright outdoor lighting. Fix the edges of walkways and driveways and fix any cracks. Clear paths of anything that can make you trip, such as tools or rocks. Add color or contrast paint or tape to clearly mark and help you see anything that might make you trip as you walk through a door, such as a raised step or threshold. Trim any bushes or trees on paths to your home. Check to see if handrails are loose or broken and that both sides of all steps have handrails. Install guardrails along the edges of any raised decks and porches. Have leaves, snow, or ice cleared regularly. Use sand, salt, or ice melter on paths if you live where there is ice and snow during the winter. Clean up any spills in your garage right away. This includes grease or oil spills. What other actions can I take? Review your medicines with your doctor. Some medicines can cause dizziness or changes in blood pressure, which increase your risk of falling. Wear shoes that: Have a low heel. Do not wear high heels. Have rubber bottoms and are closed at the toe. Feel good on your feet and fit well. Use tools that help you move around if needed. These include: Canes. Walkers. Scooters. Crutches. Ask your doctor what else you can do to help prevent falls. This may include seeing a physical therapist to learn to do exercises to move better and get stronger. Where to find more information Centers for Disease Control and Prevention, STEADI: tonerpromos.no [employer] on Aging: baseringtones.pl National Institute on Aging: baseringtones.pl Contact a doctor if: You are afraid of falling at home. You feel weak, drowsy, or dizzy at home. You fall at home. Get help right away if you: Lose consciousness or have trouble moving  after a fall. Have a fall that causes a head injury. These symptoms may be an emergency. Get help right away. Call 911. Do not wait to see if the symptoms will go away. Do not drive yourself to the hospital. This information is not intended to replace advice given to you by your health care provider. Make sure you discuss any questions you have with your health care provider. Document Revised: 04/08/2022 Document Reviewed: 04/08/2022 Elsevier Patient Education  2024 [employer].

## 2024-08-02 NOTE — Patient Outreach (Signed)
 Complex Care Management   Visit Note  08/02/2024  Name:  Mark Mcintosh MRN: [id number] DOB: [birth year]  Situation: Referral received for Complex Care Management related to HTN, Smoking Cessation I obtained verbal consent from Patient.  Visit completed with Patient  on the phone  Background:   Past Medical History:  Diagnosis Date   Acute CVA (cerebrovascular accident) (HCC) 01/09/2024   Arthritis    Hypertension    Stroke (HCC) 01/09/2024   Stated he had 2 strokes.    Assessment: Patient Reported Symptoms:  Cognitive Cognitive Status: Alert and oriented to person, place, and time, Insightful and able to interpret abstract concepts, Normal speech and language skills Cognitive/Intellectual Conditions Management [RPT]: None reported or documented in medical history or problem list   Health Maintenance Behaviors: Annual physical exam, Exercise, Healthy diet, Spiritual practice(s) Health Facilitated by: Healthy diet, Prayer/meditation  Neurological Neurological Review of Symptoms: Weakness Neurological Management Strategies: Exercise, Routine screening Neurological Comment: weakness L side followig stroke, reports about 50% strength, no loss of sensation or numbness,  HEENT HEENT Symptoms Reported: No symptoms reported HEENT Comment: needs dental care, will call [place] provider [place]    Cardiovascular Cardiovascular Symptoms Reported: No symptoms reported Does patient have uncontrolled Hypertension?: No Cardiovascular Management Strategies: Medication therapy, Routine screening Cardiovascular Comment: denies fatigue but reports does get tired more easily with activity, aware to report to provider if worsening tolerance,  discussed sx of stroke, sending info, reviewed red flags for ED  Respiratory Respiratory Symptoms Reported: No symptoms reported Other Respiratory Symptoms: discussed checking with pharmacist before purchasing cold/cough meds due to risk of raising  BP Additional Respiratory Details: smokes 7-8 cigarettes/day reduced from prior to stroke,  and uses nicorette gum at times, insurance not covering patches, would like to work on smoking cessation - positive encouragement provided Respiratory Management Strategies: Routine screening  Endocrine Endocrine Symptoms Reported: Not assessed Is patient diabetic?: No Endocrine Comment: AIC 5.9 has changed diet since stroke - low salt heart healthy low cab  Gastrointestinal Gastrointestinal Symptoms Reported: No symptoms reported Additional Gastrointestinal Details: occasional constipation - managed with foods Gastrointestinal Management Strategies: Diet modification    Genitourinary Genitourinary Symptoms Reported: No symptoms reported Additional Genitourinary Details: since stroke, more frequent use but also drinking more water, discussed UTI sx to call provider Genitourinary Comment: cialis prn  Integumentary Integumentary Symptoms Reported: No symptoms reported Additional Integumentary Details: dry skin from cold    Musculoskeletal Musculoskelatal Symptoms Reviewed: Back pain, Weakness Additional Musculoskeletal Details: left side weakness from CVA, back pain manageable tylenol  prn, no cane Musculoskeletal Management Strategies: Activity, Medication therapy, Routine screening Falls in the past year?: Yes Number of falls in past year: 1 or less Was there an injury with Fall?: Yes Fall Risk Category Calculator: 2 Patient Fall Risk Level: Moderate Fall Risk Patient at Risk for Falls Due to: History of fall(s), Impaired balance/gait, Impaired mobility Fall risk Follow up: Falls evaluation completed, Falls prevention discussed, Education provided  Psychosocial Psychosocial Symptoms Reported: No symptoms reported Additional Psychological Details: discussed services available if needed through LCSW repoirts let go from job due to length of time out from work, stress from not having regular income,  discussed possibility of vocational rehab, reviewed possible benefits thr[place]ugh [place] and given website/advised to call to explore eligibility and aware will have information in [employer] Management Strategies: Adequate rest, Exercise, Support system, Activity Major Change/Loss/Stressor/Fears (CP): Medical condition, self, Resources Behaviors When Feeling Stressed/Fearful: talks with God Techniques to [place] with Loss/Stress/Change:  Spiritual practice(s) Quality of Family Relationships: helpful, involved Do you feel physically threatened by others?: No    08/02/2024    PHQ2-9 Depression Screening   Little interest or pleasure in doing things Not at all  Feeling down, depressed, or hopeless Not at all  PHQ-2 - Total Score 0  Trouble falling or staying asleep, or sleeping too much    Feeling tired or having little energy    Poor appetite or overeating     Feeling bad about yourself - or that you are a failure or have let yourself or your family down    Trouble concentrating on things, such as reading the newspaper or watching television    Moving or speaking so slowly that other people could have noticed.  Or the opposite - being so fidgety or restless that you have been moving around a lot more than usual    Thoughts that you would be better off dead, or hurting yourself in some way    PHQ2-9 Total Score    If you checked off any problems, how difficult have these problems made it for you to do your work, take care of things at home, or get along with other people    Depression Interventions/Treatment      There were no vitals filed for this visit.    Medications Reviewed Today     Reviewed by Devra Lands, RN (Registered Nurse) on 08/02/24 at 1119  Med List Status: <None>   Medication Order Taking? Sig Documenting Provider Last Dose Status Informant  amLODipine  (NORVASC ) 2.5 MG tablet [id number] Yes Take 1 tablet (2.5 mg total) by mouth daily. Wellington Beams A,  FNP  Active   aspirin  EC 81 MG tablet [id number] Yes Take 1 tablet (81 mg total) by mouth daily. Swallow whole. Wellington Beams A, FNP  Active   atorvastatin  (LIPITOR) 40 MG tablet [id number] Yes Take 1 tablet (40 mg total) by mouth daily. Wellington Beams LABOR, FNP  Active   empagliflozin (JARDIANCE) 10 MG TABS tablet [id number] Yes Take 10 mg by mouth daily. [provider]  Active   irbesartan  (AVAPRO ) 75 MG tablet [id number] Yes Take 1 tablet (75 mg total) by mouth daily. Wellington Beams LABOR, FNP  Active   tadalafil (CIALIS) 5 MG tablet [id number] Yes Take 5 mg by mouth daily.  Patient taking differently: Take 5 mg by mouth daily as needed.   [provider]  Active   vitamin D3 (CHOLECALCIFEROL ) 25 MCG tablet [id number] Yes Take 1 tablet (1,000 Units total) by mouth daily. Wellington Beams LABOR, FNP  Active             Recommendation:   PCP Follow-up Continue Current Plan of Care  Follow Up Plan:   Telephone follow-up 2 Lilliahna Schubring  Lands Devra, MSN, RN [facility]  [facility], [facility] RN Care Manager Direct Dial: [phone] Fax: [phone]

## 2024-08-03 ENCOUNTER — Ambulatory Visit: Payer: Self-pay

## 2024-08-03 ENCOUNTER — Encounter: Admitting: Speech Pathology

## 2024-08-05 ENCOUNTER — Ambulatory Visit: Payer: Self-pay

## 2024-08-05 ENCOUNTER — Encounter: Admitting: Speech Pathology

## 2024-08-06 ENCOUNTER — Ambulatory Visit: Admit: 2024-08-06 | Admitting: Gastroenterology

## 2024-08-06 SURGERY — COLONOSCOPY
Anesthesia: General

## 2024-08-09 ENCOUNTER — Other Ambulatory Visit: Payer: Self-pay

## 2024-08-09 NOTE — Patient Instructions (Signed)
 Visit Information  Mr. Helderman was given information about Medicaid Managed Care team care coordination services as a part of their [place] Medicaid benefit.   If you would like to schedule transportation through your [place] Medicaid plan, please call the following number at least 2 days in advance of your appointment: [phone].   There is no limit to the number of trips during the year between medical appointments, healthcare facilities, or pharmacies. Transportation must be scheduled at least 2 business days before but not more than thirty 30 days before of your appointment.  Call the Behavioral Health Crisis Line at [phone], at any time, 24 hours a day, 7 days a week. If you are in danger or need immediate medical attention call 911.     Social Worker will follow up in 30 days.   Thersia Hoar, BSW, MHA [place]  [facility] Social Worker, Population Health [phone]   Following is a copy of your plan of care:   Goals Addressed             This Visit's Progress    BSW Goals       Current SDOH Barriers:  Financial constraints related to No income Limited access to food Housing barriers  Interventions: Referred patient to community resources for food, rent and utilities.  SW sent resources via mail and email on file.    Thersia Hoar, HEDWIG, MHA [place]  [facility] Social Worker, Population Health [phone]

## 2024-08-09 NOTE — Patient Outreach (Signed)
 Social Drivers of Health  Community Resource and Care Coordination Visit Note   08/09/2024  Name: Mark Mcintosh MRN: [id number] DOB:1970/09/29  Situation: Referral received for SDoH needs assessment and assistance related to [employer] . I obtained verbal consent from Patient.  Visit completed with Patient on the phone.   Background:      Assessment:   Goals Addressed             This Visit's Progress    BSW Goals       Current SDOH Barriers:  Financial constraints related to No income Limited access to food Housing barriers  Interventions: Referred patient to community resources for food, rent and utilities.  SW sent resources via mail and email on file.    Thersia Hoar, BSW, MHA [place]  [facility] Social Worker, Population Health [phone]           Recommendation:   follow up with resources provided regarding housing needs call and/or follow up with resources  for food assistance  Follow Up Plan:   Telephone follow-up in 1 month  Thersia Hoar, BSW, [place] [facility]  [facility] Social Worker, Population Health [phone]

## 2024-08-10 ENCOUNTER — Ambulatory Visit: Payer: Self-pay

## 2024-08-10 ENCOUNTER — Telehealth: Payer: Self-pay

## 2024-08-10 ENCOUNTER — Encounter: Admitting: Speech Pathology

## 2024-08-10 NOTE — Progress Notes (Signed)
 Complex Care Management Note Care Guide Note  08/10/2024 Name: Mark Mcintosh MRN: [id number] DOB: 01/10/1971   Complex Care Management Outreach Attempts: An unsuccessful telephone outreach was attempted today to offer the patient information about available complex care management services.  Follow Up Plan:  Additional outreach attempts will be made to offer the patient complex care management information and services.   Encounter Outcome:  No Answer  Dreama [facility]  [facility], [facility] VBCI Assistant Direct Dial: [phone]  Fax: [phone]

## 2024-08-16 NOTE — Progress Notes (Unsigned)
 Complex Care Management Note Care Guide Note  08/16/2024 Name: Mark Mcintosh MRN: [id number] DOB: Dec 16, 1970   Complex Care Management Outreach Attempts: A second unsuccessful outreach was attempted today to offer the patient with information about available complex care management services.  Follow Up Plan:  Additional outreach attempts will be made to offer the patient complex care management information and services.   Encounter Outcome:  No Answer  Dreama [facility]  [facility], [facility] VBCI Assistant Direct Dial: [phone]  Fax: [phone]

## 2024-08-17 ENCOUNTER — Ambulatory Visit: Payer: Self-pay

## 2024-08-17 ENCOUNTER — Encounter: Admitting: Speech Pathology

## 2024-08-17 NOTE — Progress Notes (Signed)
 Complex Care Management Note  Care Guide Note 08/17/2024 Name: Makar Slatter MRN: [id number] DOB: 1970/09/21  Branko Steeves is a 53 y.o. year old male who sees Wellington Curtis LABOR, FNP (Inactive) for primary care. I reached out to Lonni Champ by phone today to offer complex care management services.  Mr. Bechtol was given information about Complex Care Management services today including:   The Complex Care Management services include support from the care team which includes your Nurse Care Manager, Clinical Social Worker, or Pharmacist.  The Complex Care Management team is here to help remove barriers to the health concerns and goals most important to you. Complex Care Management services are voluntary, and the patient may decline or stop services at any time by request to their care team member.   Complex Care Management Consent Status: Patient agreed to services and verbal consent obtained.   Follow up plan:  Telephone appointment with complex care management team member scheduled for:  09/02/24 at 9:30 a.m.   Encounter Outcome:  Patient Scheduled  Dreama [facility]  [facility], [facility] VBCI Assistant Direct Dial: [phone]  Fax: [phone]

## 2024-08-20 ENCOUNTER — Other Ambulatory Visit: Payer: Self-pay

## 2024-08-20 NOTE — Patient Instructions (Signed)
 Visit Information  [name] was given information about Medicaid Managed Care team care coordination services as a part of their [place] benefit.   If you would like to schedule transportation through your [place] plan, please call the following number at least 2 days in advance of your appointment: [phone].   There is no limit to the number of trips during the year between medical appointments, healthcare facilities, or pharmacies. Transportation must be scheduled at least 2 business days before but not more than thirty 30 days before of your appointment.  Call the Behavioral Health Crisis Line at [phone], at any time, 24 hours a day, 7 days a week. If you are in danger or need immediate medical attention call 911.   Please see education materials related to NONE provided by MyChart link.  Care plan and visit instructions communicated with the patient verbally today. Patient agrees to receive a copy in MyChart. Active MyChart status and patient understanding of how to access instructions and care plan via MyChart confirmed with patient.     Telephone follow up appointment with Managed Medicaid care management team member scheduled for: 09/14/2024 at 3:00 pm  Mark Duos, MSN, RN [place]  [facility], [facility] RN Care Manager Direct Dial: [phone] Fax: [phone]   Following is a copy of your plan of care:   Goals Addressed             This Visit's Progress    VBCI RN Care Plan - Smoking Cessation/HTN       Problems:  Chronic Disease Management support and education needs related to Tobacco Use/Smoking Cessation  Goal: Over the next 90 days the Patient will attend all scheduled medical appointments: attend appointments as evidenced by chart review        continue to work with RN Care Manager and/or Social Worker to address care management and care coordination needs related to Tobacco Use and  Cessation as evidenced by adherence to care management team scheduled appointments     demonstrate Ongoing adherence to prescribed treatment plan for smoking cessation and BP control as evidenced by taking medications as prescribed, using nicorette gum prn, continuing rehab exercises at home, continue heart healthy diet, check BP once cuff received, promptly report concerns/questions to provider take all medications exactly as prescribed and will call provider for medication related questions as evidenced by patient report     Interventions:   Smoking Cessation Interventions: Reviewed smoking history:  tobacco abuse of 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; currently smoking 1/3 ppd Previous quit attempts, no prior attempts to quit - has successfully reduced smoking for 1 PPD to 7-8 cigarettes per day since stroke Reports smoking within 30 minutes of waking up Reports triggers to smoke include: after eating, prior when drinking, no longer drinks, before bed Reports motivation to quit smoking includes: need to do to better for my life since had stroke On a scale of 1-10, reports MOTIVATION to quit is 7 On a scale of 1-10, reports CONFIDENCE in quitting is 8-9  Advised patient to discuss smoking cessation options with provider Provided patient with printed smoking cessation educational materials Provided contact information for [place] Quit Line (1-800-QUIT-NOW) Discussed plans with patient for ongoing care management follow up and provided patient with direct contact information for care management team Screening for signs and symptoms of depression  Assessed social determinant of health barriers RNCM requesting BP cuff from PCP  Patient Self-Care Activities:  Attend all scheduled provider appointments Call pharmacy  for medication refills 3-7 days in advance of running out of medications Call provider office for new concerns or questions  Take medications as prescribed   Work on new daily schedule, find one time a day  when usually smoke where you can replace with gum and activity of choice   Plan:  Telephone follow up appointment with care management team member scheduled for:  09/14/2024 at 3:00 pm

## 2024-08-20 NOTE — Patient Outreach (Signed)
 Complex Care Management   Visit Note  08/20/2024  Name:  Mark Mcintosh MRN: [id number] DOB: 1971/06/19  Situation: Referral received for Complex Care Management related to Smoking Cessation I obtained verbal consent from Patient.  Visit completed with Patient  on the phone  Background:   Past Medical History:  Diagnosis Date   Acute CVA (cerebrovascular accident) (HCC) 01/09/2024   Arthritis    Hypertension    Stroke (HCC) 01/09/2024   Stated he had 2 strokes.    Assessment: Patient Reported Symptoms:  Cognitive Cognitive Status: No symptoms reported Cognitive/Intellectual Conditions Management [RPT]: None reported or documented in medical history or problem list   Health Maintenance Behaviors: Annual physical exam, Exercise  Neurological Neurological Review of Symptoms: Weakness (no changes)    HEENT HEENT Symptoms Reported: No symptoms reported      Cardiovascular Cardiovascular Symptoms Reported: No symptoms reported Cardiovascular Management Strategies: Medication therapy, Routine screening  Respiratory Respiratory Symptoms Reported: No symptoms reported Other Respiratory Symptoms: reports no change in amount smoking doing good job cutting down wants to do better Respiratory Management Strategies: Routine screening  Endocrine Is patient diabetic?: No    Gastrointestinal Gastrointestinal Symptoms Reported: No symptoms reported      Genitourinary Genitourinary Symptoms Reported: No symptoms reported    Integumentary Integumentary Symptoms Reported: Not assessed    Musculoskeletal Musculoskelatal Symptoms Reviewed: Back pain, Weakness Musculoskeletal Comment: would like to resume rehab, considerappointment 1/23 for consideratation back to work, does rehab exercises on own Falls in the past year?: Yes Number of falls in past year: 1 or less Was there an injury with Fall?: Yes Fall Risk Category Calculator: 2 Patient Fall Risk Level: Moderate Fall Risk Patient at  Risk for Falls Due to: History of fall(s), Impaired balance/gait Fall risk Follow up: Falls evaluation completed, Falls prevention discussed  Psychosocial Additional Psychological Details: when feels down, turns to God   Major Change/Loss/Stressor/Fears (CP): Medical condition, self, Resources      08/20/2024    PHQ2-9 Depression Screening   Little interest or pleasure in doing things Several days  Feeling down, depressed, or hopeless Not at all  PHQ-2 - Total Score 1  Trouble falling or staying asleep, or sleeping too much    Feeling tired or having little energy    Poor appetite or overeating     Feeling bad about yourself - or that you are a failure or have let yourself or your family down    Trouble concentrating on things, such as reading the newspaper or watching television    Moving or speaking so slowly that other people could have noticed.  Or the opposite - being so fidgety or restless that you have been moving around a lot more than usual    Thoughts that you would be better off dead, or hurting yourself in some way    PHQ2-9 Total Score    If you checked off any problems, how difficult have these problems made it for you to do your work, take care of things at home, or get along with other people    Depression Interventions/Treatment      There were no vitals filed for this visit.    Medications Reviewed Today     Reviewed by Devra Lands, RN (Registered Nurse) on 08/20/24 at 1509  Med List Status: <None>   Medication Order Taking? Sig Documenting Provider Last Dose Status Informant  amLODipine  (NORVASC ) 2.5 MG tablet [id number] Yes Take 1 tablet (2.5 mg total) by mouth daily. Wellington,  Curtis LABOR, FNP  Active   aspirin  EC 81 MG tablet [id number] Yes Take 1 tablet (81 mg total) by mouth daily. Swallow whole. Wellington Curtis LABOR, FNP  Active   atorvastatin  (LIPITOR) 40 MG tablet [id number] Yes Take 1 tablet (40 mg total) by mouth daily. Wellington Curtis LABOR, FNP  Active    empagliflozin (JARDIANCE) 10 MG TABS tablet [id number] Yes Take 10 mg by mouth daily. [provider]  Active   irbesartan  (AVAPRO ) 75 MG tablet [id number] Yes Take 1 tablet (75 mg total) by mouth daily. Wellington Curtis LABOR, FNP  Active   tadalafil (CIALIS) 5 MG tablet [id number] Yes Take 5 mg by mouth daily.  Patient taking differently: Take 5 mg by mouth daily as needed.   [provider]  Active   vitamin D3 (CHOLECALCIFEROL ) 25 MCG tablet [id number] Yes Take 1 tablet (1,000 Units total) by mouth daily. Wellington Curtis LABOR, FNP  Active             Recommendation:   PCP Follow-up Continue Current Plan of Care  Follow Up Plan:   Telephone follow-up in 1 month  [name], MSN, RN [facility]  [facility], [facility] RN Care Manager Direct Dial: [phone] Fax: [phone]

## 2024-08-24 ENCOUNTER — Encounter: Admitting: Speech Pathology

## 2024-08-24 ENCOUNTER — Ambulatory Visit: Payer: Self-pay

## 2024-08-26 ENCOUNTER — Ambulatory Visit: Payer: Self-pay

## 2024-08-26 ENCOUNTER — Encounter: Admitting: Speech Pathology

## 2024-08-31 ENCOUNTER — Ambulatory Visit: Payer: Self-pay

## 2024-08-31 ENCOUNTER — Encounter: Admitting: Dietician

## 2024-08-31 ENCOUNTER — Encounter: Admitting: Speech Pathology

## 2024-09-02 ENCOUNTER — Encounter: Admitting: Speech Pathology

## 2024-09-02 ENCOUNTER — Ambulatory Visit: Payer: Self-pay

## 2024-09-02 ENCOUNTER — Other Ambulatory Visit

## 2024-09-07 ENCOUNTER — Encounter

## 2024-09-07 ENCOUNTER — Ambulatory Visit

## 2024-09-08 ENCOUNTER — Telehealth: Payer: Self-pay | Admitting: Neurology

## 2024-09-08 NOTE — Telephone Encounter (Signed)
 LVM sent Mychart informing pt 2/26 needs to be rescheduled NP OUT

## 2024-09-09 ENCOUNTER — Ambulatory Visit

## 2024-09-09 ENCOUNTER — Other Ambulatory Visit: Payer: Self-pay

## 2024-09-09 ENCOUNTER — Encounter

## 2024-09-09 NOTE — Patient Outreach (Signed)
 Social Drivers of Health  Community Resource and Care Coordination Visit Note   09/09/2024  Name: Mark Mcintosh MRN: [id number] DOB:January 24, 1971  Situation: Referral received for [facility] needs assessment and assistance related to Housing  Financial [employer] . I obtained verbal consent from Patient.  Visit completed with Patient on the phone.   Background:      Assessment:   Goals Addressed             This Visit's Progress    COMPLETED: BSW Goals       Current SDOH Barriers:  Financial constraints related to No income Limited access to food Housing barriers  Interventions: Referred patient to community resources for food, rent and utilities.  SW sent resources via mail and email on file.    Thersia Hoar, BSW, MHA [place]  [facility] Social Worker, Population Health [phone]           Recommendation:   Contact SW for any future needs.  Follow Up Plan:   Patient has achieved all patient stated goals. [employer] will be closed. Patient has been provided contact information should new needs arise.   Thersia Hoar, HEDWIG, MHA [place]  [facility] Social Worker, Population Health [phone]

## 2024-09-09 NOTE — Patient Instructions (Signed)
 Visit Information  [name] was given information about Medicaid Managed Care team care coordination services as a part of their [facility] Medicaid benefit.   If you would like to schedule transportation through your [facility] plan, please call the following number at least 2 days in advance of your appointment: [phone].   You can also use the MTM portal or MTM mobile app to manage your rides. Reimbursement for transportation is available through [facility]! For the portal, please go to [email or web address]  Call the [facility] Crisis Line at [phone], at any time, 24 hours a day, 7 days a week. If you are in danger or need immediate medical attention call 911.    The  Patient                                              has been provided with contact information for the Managed Medicaid care management team and has been advised to call with any health related questions or concerns.   Thersia Hoar, BSW, MHA [place]  [facility] Social Worker, Population Health [phone]   Following is a copy of your plan of care:   Goals Addressed             This Visit's Progress    COMPLETED: BSW Goals       Current SDOH Barriers:  Financial constraints related to No income Limited access to food Housing barriers  Interventions: Referred patient to community resources for food, rent and utilities.  SW sent resources via mail and email on file.    Thersia Hoar, HEDWIG, MHA [place]  [facility] Social Worker, Population Health [phone]

## 2024-09-10 ENCOUNTER — Encounter: Payer: Self-pay | Attending: Registered Nurse | Admitting: Physical Medicine & Rehabilitation

## 2024-09-10 ENCOUNTER — Encounter: Payer: Self-pay | Admitting: Physical Medicine & Rehabilitation

## 2024-09-10 VITALS — BP 127/93 | HR 114 | Ht 71.0 in | Wt 234.0 lb

## 2024-09-10 DIAGNOSIS — I69354 Hemiplegia and hemiparesis following cerebral infarction affecting left non-dominant side: Secondary | ICD-10-CM | POA: Diagnosis not present

## 2024-09-10 DIAGNOSIS — R269 Unspecified abnormalities of gait and mobility: Secondary | ICD-10-CM | POA: Diagnosis not present

## 2024-09-10 DIAGNOSIS — I69398 Other sequelae of cerebral infarction: Secondary | ICD-10-CM | POA: Insufficient documentation

## 2024-09-10 NOTE — Progress Notes (Signed)
 "  Subjective:    Patient ID: Mark Mcintosh, male    DOB: 1971-08-01, 54 y.o.   MRN: [id number]  HPI Discussed the use of AI scribe software for clinical note transcription with the patient, who gave verbal consent to proceed.  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  He is eight months post-right cerebrovascular accident and previously participated in physical, occupational, and speech therapy at [facility] in [place]. Rehabilitation was discontinued due to loss of insurance coverage, but he has recently obtained Medicaid and seeks to resume therapy.  He continues to experience left-sided weakness, which has improved since his last visit. He performs home exercise as instructed, but perceives slowed progress since discontinuing formal rehabilitation. He reports persistent numbness at the tips of two fingers on his left hand, previously involving all fingers, now limited to the distal tips, described as sitting in ice. He also notes mild coordination deficits and stiffness, particularly during squatting.  He is independent with dressing, bathing, cooking, and other basic activities of daily living. He drove himself to today's appointment for the first time since his stroke, feeling safe but somewhat anxious due to increased traffic and weather conditions.  The patient was fired from his job.   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 He is currently applying for less physically demanding positions and continues to receive disability benefits.   Pain Inventory Average Pain 1 Pain Right Now 1 My pain is tingling  In the last 24 hours, has pain  interfered with the following? General activity 0 Relation with others 0 Enjoyment of life 1 What TIME of day is your pain at its worst? night Sleep (in general) Good  Pain is worse with: . Pain improves with: . Relief from Meds: .  Family History  Problem Relation Age of Onset   Diabetes Mother    Hypertension Mother    Heart failure Maternal Grandmother    Social History   Socioeconomic History   Marital status: Single    Spouse name: Not on file   Number of children: Not on file   Years of education: Not on file   Highest education level: GED or equivalent  Occupational History   Not on file  Tobacco Use   Smoking status: Every Day    Current packs/day: 0.50    Types: Cigarettes   Smokeless tobacco: Never   Tobacco comments:    Patient currently using a Nicotine  patch and chewing gum to help him stop, states since strokes he went from smoking a pack a day to 5-6.    Insurance no longer approving patch, smoking 7-8/day, uses nicoderm gum  Vaping Use   Vaping status: Never Used  Substance and Sexual Activity   Alcohol use: Not Currently    Alcohol/week: 2.0 standard drinks of alcohol    Types: 2 Cans of beer per week   Drug use: Not Currently    Types: Marijuana   Sexual activity: Not Currently  Other Topics Concern   Not on file  Social History Narrative   Not on file   Social Drivers of Health   Tobacco Use: High Risk (08/02/2024)   Patient History    Smoking Tobacco Use: Every Day  Smokeless Tobacco Use: Never    Passive Exposure: Not on file  Financial Resource Strain: Medium Risk (08/02/2024)   Overall Financial Resource Strain (CARDIA)    Difficulty of Paying Living Expenses: Somewhat hard  Food Insecurity: Food Insecurity Present (08/20/2024)   Epic    Worried About Programme Researcher, Broadcasting/film/video in the Last Year: Sometimes true    Ran Out of Food in the Last Year: Sometimes true  Transportation Needs: No Transportation Needs (08/02/2024)   Epic    Lack  of Transportation (Medical): No    Lack of Transportation (Non-Medical): No  Recent Concern: Transportation Needs - Unmet Transportation Needs (07/03/2024)   Epic    Lack of Transportation (Medical): Yes    Lack of Transportation (Non-Medical): Yes  Physical Activity: Insufficiently Active (08/02/2024)   Exercise Vital Sign    Days of Exercise per Week: 2 days    Minutes of Exercise per Session: 30 min  Stress: No Stress Concern Present (08/02/2024)   [employer] of Occupational Health - Occupational Stress Questionnaire    Feeling of Stress: Only a little  Recent Concern: Stress - Stress Concern Present (07/03/2024)   [employer] of Occupational Health - Occupational Stress Questionnaire    Feeling of Stress: To some extent  Social Connections: Moderately Isolated (08/02/2024)   Social Connection and Isolation Panel    Frequency of Communication with Friends and Family: More than three times a week    Frequency of Social Gatherings with Friends and Family: Once a week    Attends Religious Services: More than 4 times per year    Active Member of Clubs or Organizations: No    Attends Banker Meetings: Never    Marital Status: Divorced  Depression (PHQ2-9): Low Risk (08/20/2024)   Depression (PHQ2-9)    PHQ-2 Score: 1  Alcohol Screen: Low Risk (08/02/2024)   Alcohol Screen    Last Alcohol Screening Score (AUDIT): 0  Housing: Low Risk (08/20/2024)   Epic    Unable to Pay for Housing in the Last Year: No    Number of Times Moved in the Last Year: 0    Homeless in the Last Year: No  Recent Concern: Housing - High Risk (07/03/2024)   Epic    Unable to Pay for Housing in the Last Year: Yes    Number of Times Moved in the Last Year: 0    Homeless in the Last Year: No  Utilities: Not At Risk (08/02/2024)   Epic    Threatened with loss of utilities: No  Health Literacy: Adequate Health Literacy (08/02/2024)   B1300 Health Literacy    Frequency of need for help  with medical instructions: Rarely   Past Surgical History:  Procedure Laterality Date   BREAST BIOPSY Right 03/15/2024   US  RT BREAST BX W LOC DEV 1ST LESION IMG BX SPEC US  GUIDE 03/15/2024 [facility]   TEE WITHOUT CARDIOVERSION N/A 01/14/2024   Procedure: ECHOCARDIOGRAM, TRANSESOPHAGEAL;  Surgeon: Alluri, Keller BROCKS, MD;  Location: [facility];  Service: Cardiovascular;  Laterality: N/A;   Past Surgical History:  Procedure Laterality Date   BREAST BIOPSY Right 03/15/2024   US  RT BREAST BX W LOC DEV 1ST LESION IMG BX SPEC US  GUIDE 03/15/2024 [facility]   TEE WITHOUT CARDIOVERSION N/A 01/14/2024   Procedure: ECHOCARDIOGRAM, TRANSESOPHAGEAL;  Surgeon: Alluri, Keller BROCKS, MD;  Location: [facility];  Service: Cardiovascular;  Laterality: N/A;   Past Medical History:  Diagnosis Date   Acute CVA (cerebrovascular accident) (HCC) 01/09/2024  Arthritis    Hypertension    Stroke (HCC) 01/09/2024   Stated he had 2 strokes.   BP (!) 137/102   Pulse (!) 114   Ht 5' 11 (1.803 m)   Wt 234 lb (106.1 kg)   SpO2 90%   BMI 32.64 kg/m   Opioid Risk Score:   Fall Risk Score:  `1  Depression screen [facility] 2/9     08/20/2024    3:18 PM 08/02/2024   11:59 AM 06/29/2024    9:56 AM 06/11/2024   11:36 AM 03/26/2024   11:11 AM 02/27/2024    9:11 AM 02/11/2024    8:28 AM  Depression screen PHQ 2/9  Decreased Interest 1 0 0 0 0 1 0  Down, Depressed, Hopeless 0 0 0 0 0 1 0  PHQ - 2 Score 1 0 0 0 0 2 0  Altered sleeping     1 1   Tired, decreased energy     0 2   Change in appetite     0 1   Feeling bad or failure about yourself      0 1   Trouble concentrating     1 2   Moving slowly or fidgety/restless     0 1   Suicidal thoughts     0 0   PHQ-9 Score     2  10    Difficult doing work/chores     Somewhat difficult Not difficult at all      Data saved with a previous flowsheet row definition     Review of Systems  Musculoskeletal:        Left hand tingling  Neurological:  Positive for  weakness.  All other systems reviewed and are negative.      Objective:   Physical Exam General No acute distress Mood and affect appropriate Speech with mild dysarthria Motor strength is 5/5 in the right deltoid bicep tricep grip hip flexor knee extensor ankle dorsiflexion 4/5 in left deltoid bicep tricep grip hip flexor knee extensor ankle dorsiflexion Decreased finger thumb opposition left upper extremity Positive dysdiadochokinesis with rapid alternating supination pronation left upper extremity Range of motion at the shoulder elbow wrist and fingers is good bilaterally Ambulates without assistive device no evidence of toe drag or knee instability Tone mildly increased left upper and left lower limb       Assessment & Plan:   Assessment and Plan Assessment & Plan Left hemiparesis and gait disturbance as sequelae of right cerebrovascular accident Eight months post right cerebrovascular accident with persistent left hemiparesis and gait disturbance. Improvement in strength and coordination noted, but weakness and mild sensory deficits persist in the left upper extremity. Independent with basic activities, resumed driving with anxiety, unable to meet previous employment demands. Motivated for rehabilitation, interested in returning to therapy facility. Further functional gains may be limited, but therapy may provide incremental improvement and support vocational transition. - Ordered physical therapy at [facility] for continued rehabilitation of left hemiparesis and gait disturbance. - Ordered occupational therapy at [facility] for rehabilitation of left hemiparesis and upper extremity function. - Advised that Medicaid may have annual visit limits for therapy and instructed him to confirm details with the rehab facility. - Instructed that the rehab facility will contact him to schedule appointments. - Planned follow-up in approximately two months to reassess progress with therapy and discuss  vocational options. Have filled out [place]  life group benefits form. "

## 2024-09-10 NOTE — Patient Instructions (Signed)
" °  VISIT SUMMARY: You came in today for a follow-up regarding your left-sided weakness and gait issues after your stroke. You have made some progress but still have some limitations and are looking to resume therapy now that you have Medicaid coverage.  YOUR PLAN: LEFT HEMIPARESIS AND GAIT DISTURBANCE: You have ongoing left-sided weakness and gait issues following your stroke eight months ago. You have made some improvements but still experience some numbness and coordination problems. -You will resume physical therapy at [facility] to continue working on your left-sided weakness and gait issues. -You will also start occupational therapy at [facility] to improve the function of your left upper extremity. -Please confirm with the rehab facility about any annual visit limits that Medicaid may have for therapy. -The rehab facility will contact you to schedule your appointments. -We will have a follow-up appointment in about two months to check your progress and discuss your vocational options.    Contains text generated by Abridge.   "

## 2024-09-14 ENCOUNTER — Telehealth: Payer: Self-pay

## 2024-09-14 ENCOUNTER — Encounter

## 2024-09-14 ENCOUNTER — Ambulatory Visit

## 2024-09-16 ENCOUNTER — Ambulatory Visit

## 2024-09-16 ENCOUNTER — Encounter

## 2024-09-21 ENCOUNTER — Encounter

## 2024-09-21 ENCOUNTER — Ambulatory Visit

## 2024-09-22 ENCOUNTER — Encounter: Payer: Self-pay | Admitting: Dietician

## 2024-09-23 ENCOUNTER — Encounter

## 2024-09-23 ENCOUNTER — Telehealth: Payer: Self-pay

## 2024-09-23 ENCOUNTER — Other Ambulatory Visit (HOSPITAL_COMMUNITY): Payer: Self-pay

## 2024-09-23 ENCOUNTER — Ambulatory Visit

## 2024-09-23 ENCOUNTER — Other Ambulatory Visit

## 2024-09-23 DIAGNOSIS — I10 Essential (primary) hypertension: Secondary | ICD-10-CM

## 2024-09-23 DIAGNOSIS — R7303 Prediabetes: Secondary | ICD-10-CM

## 2024-09-23 MED ORDER — EMPAGLIFLOZIN 10 MG PO TABS: 10.0000 mg | ORAL_TABLET | Freq: Every day | ORAL | 0 refills | Status: AC

## 2024-09-23 MED ORDER — BLOOD PRESSURE MONITOR AUTOMAT DEVI: 0 refills | Status: AC

## 2024-09-23 NOTE — Progress Notes (Unsigned)
" ° °  09/23/2024 Name: Mark Mcintosh MRN: [id number] DOB: 1970/11/02  No chief complaint on file.   Mark Mcintosh is a 54 y.o. year old male who presented for a telephone visit.   They were referred to the pharmacist by the [facility] Complex Case Management program for assistance in managing medication access.    Subjective:  Care Team: Primary Care Provider: Franchot Rake MD  Medication Access/Adherence  Current Pharmacy:  [facility] [address], [place] - [address] [place] [address] Phone: [phone] Fax: [phone]  [facility] [address] [place] [address] Phone: [phone] Fax: [phone]  [facility] [place], [place] - [address] [place] [address] Phone: [phone] Fax: [phone]   Patient reports affordability concerns with their medications: No  Patient reports access/transportation concerns to their pharmacy: No  Patient reports adherence concerns with their medications:  Yes  - delay in obtaining Jardiance  from the pharmacy   Hypertension:  Current medications: *** Medications previously tried:   Patient does not have a validated, automated, upper arm home BP cuff   Patient {Actions; denies-reports:120008} hypotensive s/sx including ***dizziness, lightheadedness.  Patient {Actions; denies-reports:120008} hypertensive symptoms including ***headache, chest pain, shortness of breath  Current meal patterns: ***  Current physical activity: ***   Objective:  Lab Results  Component Value Date   HGBA1C 5.9 (A) 04/16/2024    Lab Results  Component Value Date   CREATININE 0.96 01/21/2024   BUN 16 01/13/2024   NA 138 01/13/2024   K 3.5 01/13/2024   CL 102 01/13/2024   CO2 26 01/13/2024    Lab Results  Component Value Date   CHOL 185 01/10/2024   HDL 42 01/10/2024   LDLCALC 120 (H) 01/10/2024   TRIG 117 01/10/2024    CHOLHDL 4.4 01/10/2024    Medications Reviewed Today     Reviewed by Marsh, Rayven Rettig E, RPH-CPP (Pharmacist) on 09/23/24 at 1037  Med List Status: <None>   Medication Order Taking? Sig Documenting Provider Last Dose Status Informant  amLODipine  (NORVASC ) 2.5 MG tablet [id number] Yes Take 1 tablet (2.5 mg total) by mouth daily. Wellington Beams A, FNP  Active   aspirin  EC 81 MG tablet [id number] Yes Take 1 tablet (81 mg total) by mouth daily. Swallow whole. Wellington Beams LABOR, FNP  Active   atorvastatin  (LIPITOR) 40 MG tablet [id number] Yes Take 1 tablet (40 mg total) by mouth daily. Wellington Beams A, FNP  Active   empagliflozin  (JARDIANCE ) 10 MG TABS tablet [id number]  Take 10 mg by mouth daily. [provider]  Active   irbesartan  (AVAPRO ) 75 MG tablet [id number] Yes Take 1 tablet (75 mg total) by mouth daily. Wellington Beams LABOR, FNP  Active   tadalafil (CIALIS) 5 MG tablet [id number] Yes Take 5 mg by mouth daily.  Patient taking differently: Take 5 mg by mouth daily as needed.   [provider]  Active   vitamin D3 (CHOLECALCIFEROL ) 25 MCG tablet [id number] Yes Take 1 tablet (1,000 Units total) by mouth daily. Wellington Beams LABOR, FNP  Active               Assessment/Plan:   {Pharmacy A/P Choices:26421}  Follow Up Plan: ***  ***   "

## 2024-09-23 NOTE — Progress Notes (Deleted)
" ° °  S:     Reason for visit: ? 54 y.o. male who presents for hypertension evaluation, education, and management. Pertinent PMH also includes ***.  They were referred to the pharmacist by {referredtopharmacy:27270} for assistance in managing {referralreason:27271}.  Care Team: Primary Care Provider: Wellington Curtis LABOR, FNP (Inactive)  At last visit, ***.   Today, patient arrives in *** spirits and presents without *** assistance. *** Denies dizziness, headache, blurred vision, swelling.   Patient reports hypertension was diagnosed in ***.    Current antihypertensives include: ***  Antihypertensives tried in the past include: ***  Patient reports adherence to taking all medications as prescribed.  *** Patient denies adherence with medications, reports missing *** medications *** times per week, on average.  Patient reported dietary habits: Eats *** meals/day Breakfast: *** Lunch: *** Dinner: *** Snacks: *** Drinks: ***  Patient-reported exercise habits: ***   SMBP: BP cuff at home: {Blank single:19197::***,yes,no}    Reported home blood pressure readings: ***   BP medications taken today: {Blank single:19197::***,yes,no}       _______________________________________________  Objective    Physical Examination:  Vitals:  Wt Readings from Last 3 Encounters:  09/10/24 234 lb (106.1 kg)  07/09/24 231 lb 11.2 oz (105.1 kg)  06/29/24 231 lb 12.8 oz (105.1 kg)   BP Readings from Last 3 Encounters:  09/10/24 (!) 127/93  07/09/24 (!) 111/94  06/11/24 115/82   Pulse Readings from Last 3 Encounters:  09/10/24 (!) 114  07/09/24 (!) 106  06/11/24 94     Labs:?     Chemistry   Metabolic Risk Considerations  Lab Results  Component Value Date   NA 138 01/13/2024   K 3.5 01/13/2024   CL 102 01/13/2024   CO2 26 01/13/2024   BUN 16 01/13/2024   CREATININE 0.96 01/21/2024   CALCIUM  9.0 01/13/2024   Lab Results  Component Value Date   HGBA1C 5.9 (A)  04/16/2024   GLUCOSE 100 (H) 01/13/2024   CREATININE 0.96 01/21/2024   CREATININE 0.94 01/15/2024   CREATININE 0.99 01/13/2024   GFRNONAA >60 01/21/2024   GFRNONAA >60 01/15/2024   GFRNONAA >60 01/13/2024    Lab Results  Component Value Date   CHOL 185 01/10/2024   HDL 42 01/10/2024   AST 20 01/09/2024   AST 19 09/21/2023   ALT 13 01/09/2024   ALT 18 09/21/2023     The ASCVD Risk score (Arnett DK, et al., 2019) failed to calculate for the following reasons:   Risk score cannot be calculated because patient has a medical history suggesting prior/existing ASCVD   * - Cholesterol units were assumed  Assessment and Plan:   Hypertension diagnosed *** currently *** on current medications. BP goal < 130/80 *** mmHg. Medication adherence appears ***. Control is suboptimal due to ***.  -{Meds adjust:18428} ***.  -{Meds adjust:18428} ***.  -Patient educated on purpose, proper use, and potential adverse effects of ***.  -F/u labs ordered - *** -Counseled on lifestyle modifications for blood pressure control including reduced dietary sodium, increased exercise, adequate sleep. -Encouraged patient to check BP at home and bring log of readings to next visit. Counseled on proper use of home BP cuff.   {pharmacisttime:33368}  Follow-up:  Pharmacist on *** PCP clinic visit on ***  Peyton CHARLENA Ferries, PharmD, BCACP, CPP Clinical Pharmacist [facility] [phone]   "

## 2024-09-23 NOTE — Telephone Encounter (Signed)
 Pharmacy Patient Advocate Encounter   Received notification from [employer] that prior authorization for JARDIANCE  10 MG is required/requested.   Insurance verification completed.   The patient is insured through [facility] MEDICAID.   Per test claim: PA required; PA submitted to above mentioned insurance via Latent Key/confirmation #/[id number] Status is pending

## 2024-09-23 NOTE — Telephone Encounter (Signed)
 Pharmacy Patient Advocate Encounter  Received notification from [facility] MEDICAID that Prior Authorization for Jardiance  10 mg has been APPROVED from 09/23/2024 to 09/23/2025. Ran test claim, Copay is $4.00. This test claim was processed through [facility]- copay amounts may vary at other pharmacies due to pharmacy/plan contracts, or as the patient moves through the different stages of their insurance plan.   PA #/Case ID/Reference #: [id number]

## 2024-09-28 ENCOUNTER — Encounter

## 2024-09-28 ENCOUNTER — Ambulatory Visit

## 2024-09-30 ENCOUNTER — Encounter

## 2024-09-30 ENCOUNTER — Ambulatory Visit

## 2024-10-05 ENCOUNTER — Ambulatory Visit

## 2024-10-05 ENCOUNTER — Encounter

## 2024-10-07 ENCOUNTER — Ambulatory Visit

## 2024-10-07 ENCOUNTER — Encounter: Payer: Self-pay | Admitting: Dietician

## 2024-10-07 ENCOUNTER — Encounter

## 2024-10-11 ENCOUNTER — Encounter

## 2024-10-12 ENCOUNTER — Ambulatory Visit

## 2024-10-12 ENCOUNTER — Encounter

## 2024-10-14 ENCOUNTER — Telehealth: Payer: Self-pay

## 2024-10-14 ENCOUNTER — Encounter

## 2024-10-14 ENCOUNTER — Ambulatory Visit: Admitting: Neurology

## 2024-10-14 ENCOUNTER — Ambulatory Visit

## 2024-10-19 ENCOUNTER — Encounter

## 2024-10-19 ENCOUNTER — Ambulatory Visit

## 2024-10-21 ENCOUNTER — Encounter

## 2024-10-21 ENCOUNTER — Ambulatory Visit

## 2024-10-26 ENCOUNTER — Ambulatory Visit

## 2024-10-26 ENCOUNTER — Encounter

## 2024-10-28 ENCOUNTER — Encounter

## 2024-10-28 ENCOUNTER — Ambulatory Visit

## 2024-11-09 ENCOUNTER — Encounter: Attending: Registered Nurse | Admitting: Physical Medicine & Rehabilitation
